# Patient Record
Sex: Male | Born: 1940 | Race: White | Hispanic: No | Marital: Single | Smoking: Never smoker
Health system: Southern US, Community
[De-identification: ages and names within clinical notes are randomized; demographics above are authoritative.]

## PROBLEM LIST (undated history)

## (undated) DIAGNOSIS — E1140 Type 2 diabetes mellitus with diabetic neuropathy, unspecified: Secondary | ICD-10-CM

## (undated) DIAGNOSIS — I480 Paroxysmal atrial fibrillation: Secondary | ICD-10-CM

## (undated) DIAGNOSIS — I4891 Unspecified atrial fibrillation: Secondary | ICD-10-CM

## (undated) DIAGNOSIS — E119 Type 2 diabetes mellitus without complications: Secondary | ICD-10-CM

## (undated) DIAGNOSIS — I429 Cardiomyopathy, unspecified: Secondary | ICD-10-CM

## (undated) DIAGNOSIS — I219 Acute myocardial infarction, unspecified: Secondary | ICD-10-CM

## (undated) DIAGNOSIS — I10 Essential (primary) hypertension: Secondary | ICD-10-CM

## (undated) DIAGNOSIS — N429 Disorder of prostate, unspecified: Secondary | ICD-10-CM

## (undated) DIAGNOSIS — Z950 Presence of cardiac pacemaker: Secondary | ICD-10-CM

## (undated) DIAGNOSIS — F0390 Unspecified dementia without behavioral disturbance: Secondary | ICD-10-CM

## (undated) DIAGNOSIS — K219 Gastro-esophageal reflux disease without esophagitis: Secondary | ICD-10-CM

## (undated) DIAGNOSIS — I639 Cerebral infarction, unspecified: Secondary | ICD-10-CM

## (undated) DIAGNOSIS — Z972 Presence of dental prosthetic device (complete) (partial): Secondary | ICD-10-CM

## (undated) DIAGNOSIS — Z973 Presence of spectacles and contact lenses: Secondary | ICD-10-CM

## (undated) DIAGNOSIS — Z955 Presence of coronary angioplasty implant and graft: Secondary | ICD-10-CM

## (undated) DIAGNOSIS — N400 Enlarged prostate without lower urinary tract symptoms: Secondary | ICD-10-CM

## (undated) DIAGNOSIS — Z95810 Presence of automatic (implantable) cardiac defibrillator: Secondary | ICD-10-CM

## (undated) DIAGNOSIS — N200 Calculus of kidney: Secondary | ICD-10-CM

## (undated) DIAGNOSIS — H269 Unspecified cataract: Secondary | ICD-10-CM

## (undated) HISTORY — PX: HX APPENDECTOMY: SHX54

## (undated) HISTORY — PX: HX TONSILLECTOMY: SHX27

## (undated) HISTORY — PX: CORONARY ARTERY ANGIOPLASTY: PR CATH30428

## (undated) HISTORY — PX: HX LITHOTRIPSY: SHX66

## (undated) HISTORY — PX: HX PACEMAKER DEFIBRILLATOR PLACEMENT: SHX43

## (undated) HISTORY — DX: Calculus of kidney: N20.0

## (undated) HISTORY — DX: Benign prostatic hyperplasia without lower urinary tract symptoms: N40.0

## (undated) HISTORY — PX: HX ADENOIDECTOMY: SHX29

## (undated) HISTORY — DX: Cerebral infarction, unspecified (CMS HCC): I63.9

## (undated) HISTORY — DX: Unspecified dementia, unspecified severity, without behavioral disturbance, psychotic disturbance, mood disturbance, and anxiety (CMS HCC): F03.90

## (undated) HISTORY — PX: HX HEART CATHETERIZATION: SHX148

## (undated) HISTORY — PX: HX CARDIAC ABLATION: 2100001323

## (undated) HISTORY — PX: TONSILLECTOMY: SUR1361

## (undated) HISTORY — PX: APPENDECTOMY: SHX54

## (undated) HISTORY — PX: BIV ICD GENERTAOR CHANGE OUT: SHX5745

---

## 2011-08-28 ENCOUNTER — Inpatient Hospital Stay (HOSPITAL_COMMUNITY): Payer: Medicare Other

## 2011-08-28 ENCOUNTER — Encounter (HOSPITAL_COMMUNITY): Payer: Self-pay | Admitting: Internal Medicine

## 2011-08-28 ENCOUNTER — Inpatient Hospital Stay
Admission: AD | Admit: 2011-08-28 | Discharge: 2011-08-30 | DRG: 246 | Disposition: A | Discharge status: Home / Self Care | Payer: Medicare Other | Source: Other Acute Inpatient Hospital

## 2011-08-28 DIAGNOSIS — I200 Unstable angina: Secondary | ICD-10-CM | POA: Diagnosis present

## 2011-08-28 DIAGNOSIS — I447 Left bundle-branch block, unspecified: Secondary | ICD-10-CM | POA: Diagnosis present

## 2011-08-28 DIAGNOSIS — E876 Hypokalemia: Secondary | ICD-10-CM | POA: Diagnosis present

## 2011-08-28 DIAGNOSIS — I10 Essential (primary) hypertension: Secondary | ICD-10-CM | POA: Diagnosis present

## 2011-08-28 DIAGNOSIS — I2542 Coronary artery dissection: Secondary | ICD-10-CM | POA: Diagnosis not present

## 2011-08-28 DIAGNOSIS — I519 Heart disease, unspecified: Secondary | ICD-10-CM | POA: Diagnosis not present

## 2011-08-28 DIAGNOSIS — Y840 Cardiac catheterization as the cause of abnormal reaction of the patient, or of later complication, without mention of misadventure at the time of the procedure: Secondary | ICD-10-CM | POA: Diagnosis not present

## 2011-08-28 DIAGNOSIS — I2510 Atherosclerotic heart disease of native coronary artery without angina pectoris: Principal | ICD-10-CM | POA: Diagnosis present

## 2011-08-28 DIAGNOSIS — E119 Type 2 diabetes mellitus without complications: Secondary | ICD-10-CM | POA: Diagnosis present

## 2011-08-28 HISTORY — DX: Type 2 diabetes mellitus without complications (CMS HCC): E11.9

## 2011-08-28 HISTORY — DX: Presence of spectacles and contact lenses: Z97.3

## 2011-08-28 HISTORY — DX: Essential (primary) hypertension: I10

## 2011-08-28 HISTORY — DX: Presence of dental prosthetic device (complete) (partial): Z97.2

## 2011-08-28 LAB — CBC/DIFF
BASOPHILS: 1 % (ref 0–1)
BASOS ABS: 0.033 THOU/uL (ref 0.0–0.2)
EOS ABS: 0.142 THOU/uL (ref 0.1–0.3)
EOSINOPHIL: 3 % (ref 1–6)
HCT: 38.2 % — ABNORMAL LOW (ref 39.8–50.2)
HGB: 13.7 g/dL (ref 13.1–17.3)
LYMPHOCYTES: 36 % (ref 20–45)
LYMPHS ABS: 2.02 10*3/uL (ref 1.0–4.8)
MCH: 29.4 pg (ref 27.4–33.0)
MCHC: 35.8 g/dL — ABNORMAL HIGH (ref 31.6–35.5)
MCV: 82.2 fL (ref 82.0–99.0)
MONOCYTES: 11 % (ref 4–13)
MONOS ABS: 0.64 THOU/uL (ref 0.1–0.9)
MPV: 7.9 FL (ref 7.4–10.4)
NRBC'S: 0 /100{WBCs}
PLATELET COUNT: 210 THOU/uL (ref 140–450)
PMN ABS: 2.77 THOU/uL (ref 1.5–7.7)
PMN'S: 49 % (ref 40–75)
RBC: 4.65 MIL/uL (ref 4.46–5.70)
RDW: 12.5 % (ref 10.2–14.0)
WBC: 5.6 10*3/uL (ref 3.5–11.0)

## 2011-08-28 LAB — BASIC METABOLIC PANEL
ANION GAP: 9 mmol/L (ref 5–16)
BUN/CREAT RATIO: 18 (ref 6–22)
BUN: 20 mg/dL (ref 8–26)
CALCIUM: 9.2 mg/dL (ref 8.5–10.4)
CARBON DIOXIDE: 23 mmol/L (ref 23–33)
CHLORIDE: 107 mmol/L (ref 96–111)
CREATININE: 1.14 mg/dL (ref 0.62–1.27)
ESTIMATED GLOMERULAR FILTRATION RATE: >59 mL/min/{1.73_m2} (ref >59)
GLUCOSE,NONFAST: 128 mg/dL (ref 65–139)
POTASSIUM: 3.6 mmol/L (ref 3.5–5.1)
SODIUM: 139 mmol/L (ref 136–145)

## 2011-08-28 LAB — CREATINE KINASE (CK), MB FRACTION, SERUM
CK-MB: 2.6 ng/mL (ref <6.4)
MB INDEX: 5.7 % — ABNORMAL HIGH (ref 0.0–5.0)

## 2011-08-28 LAB — MAGNESIUM: MAGNESIUM: 1.9 mg/dL (ref 1.7–2.5)

## 2011-08-28 LAB — TROPONIN-I: TROPONIN-I: 0.068 ng/mL (ref <0.030)

## 2011-08-28 MED ORDER — SODIUM CHLORIDE 0.9 % (FLUSH) INJECTION SYRINGE
2.0000 mL | INJECTION | INTRAMUSCULAR | Status: DC | PRN
Start: 2011-08-28 — End: 2011-08-30

## 2011-08-28 MED ORDER — NITROGLYCERIN 0.4 MG SUBLINGUAL TABLET
0.4000 mg | SUBLINGUAL_TABLET | SUBLINGUAL | Status: DC | PRN
Start: 2011-08-28 — End: 2011-08-30

## 2011-08-28 MED ORDER — ASPIRIN 81 MG CHEWABLE TABLET
81.0000 mg | CHEWABLE_TABLET | Freq: Every day | ORAL | Status: DC
Start: 2011-08-29 — End: 2011-08-30
  Administered 2011-08-29 – 2011-08-30 (×2): 81 mg via ORAL
  Filled 2011-08-28 (×2): qty 1

## 2011-08-28 MED ORDER — INSULIN LISPRO 100 UNIT/ML SUB-Q SSIP
2.00 [IU] | INJECTION | Freq: Four times a day (QID) | SUBCUTANEOUS | Status: DC | PRN
Start: 2011-08-28 — End: 2011-08-30
  Administered 2011-08-29 (×2): 2 [IU] via SUBCUTANEOUS
  Filled 2011-08-28: qty 3

## 2011-08-28 MED ORDER — METOPROLOL TARTRATE 12.5 MG HALF TAB
12.5000 mg | ORAL_TABLET | Freq: Two times a day (BID) | ORAL | Status: DC
Start: 2011-08-29 — End: 2011-08-30
  Administered 2011-08-29 – 2011-08-30 (×4): 12.5 mg via ORAL
  Filled 2011-08-28 (×5): qty 1

## 2011-08-28 MED ORDER — LISINOPRIL 5 MG TABLET
5.0000 mg | ORAL_TABLET | Freq: Every day | ORAL | Status: DC
Start: 2011-08-29 — End: 2011-08-30
  Administered 2011-08-29 – 2011-08-30 (×2): 5 mg via ORAL
  Filled 2011-08-28 (×2): qty 1

## 2011-08-28 MED ORDER — SODIUM CHLORIDE 0.9 % (FLUSH) INJECTION SYRINGE
2.0000 mL | INJECTION | Freq: Three times a day (TID) | INTRAMUSCULAR | Status: DC
Start: 2011-08-28 — End: 2011-08-30
  Administered 2011-08-28 – 2011-08-30 (×5): 2 mL
  Administered 2011-08-30: 0 mL

## 2011-08-28 MED ORDER — ONDANSETRON HCL (PF) 4 MG/2 ML INJECTION SOLUTION
4.0000 mg | Freq: Four times a day (QID) | INTRAMUSCULAR | Status: DC | PRN
Start: 2011-08-28 — End: 2011-08-30

## 2011-08-28 NOTE — H&P (Addendum)
[facility]   Cardiology Admission  History and Physical    Name:  [name] Bed: 1061/A   Age:  71 y.o. Date of Birth:     Gender:  male MRN: [id number]    Dx at Admission: effort induced hypotension  Date of Admission:  08/28/2011   Date of Service: 08/29/2011  Hospital Day #:   LOS: 1 day    PCP:  Dr. Durel Salts  Code: Full  Cardiologist: no cardiologist: cardiologist moved to [place]    Information Obtained from: patient  Chief Complaint:  Transfer from [facility] for new onset LBBB identified on EKG; Dizziness and decreased exercise capacity x 1 month    HPI: [name] is a 71 y.o. male with a history of Hypertension, Diabetes, and history Kidney stones. who presents as a transfer from [facility] for possible left catheterization due to identification of new LBBB on EKG with elevated troponins (0.076 -> 0.109). He has never had any chest pain. However he did experience dizziness and decreased blood pressures on the day of admission to [facility]. He does have a little back tenderness which he attributes to lying in the hospital bed. He denied any shortness of breath, abdominal pain, nausea, vomiting, diarrhea, and constipation, bloody stools, or urinary symptoms. He is otherwise doing well.    Cardiac Risk Factors:    Past cardiac history: No  Current/Recent Smoker (within 1 year): No  Hypertension:   (BP 140/90): Yes  Dyslipidemia: (Total Chol greater than 200/LDL greater than 130 mg/dL): No  Fam Hx of Premature CAD:(male less than 44yrs; male less than 66 yrs): Yes  Cerebrovascular Disease :  No  Peripheral Arterial Disease: No  Diabetes Mellitus: Yes  Currently On Dialysis:  No  Chronic Lung Disease:  No  Cardiac Arrest w/in 24 Hours: No  Prior history of MI:   No  Prior PCI: No  MI this admission: No  CAD Presentation:  No symptoms or angina in last 14 days  Anginal Classification w/in 2 Weeks:  No symptoms   Prior CABG: No  Prior Valve Surgery/Procedure:  No  Pre-operative Evaluation before Non-Cardiac Surgery: No  Prior Heart Failure: No  Cardiomyopathy: No  LV Systolic Dysfunction:  No  Stress or Imaging Studies Performed:  None    PAST MEDICAL:    Past Medical History   Diagnosis Date   . HTN (hypertension)    . Diabetes mellitus    . Wears glasses    . Wears dentures     Past Surgical History   Procedure Date   . Hx lithotripsy    . Hx appendectomy    . Hx tonsillectomy    . Hx adenoidectomy         Medications Prior to Admission     Medication    metFORMIN (GLUCOPHAGE) 850 mg Oral Tablet    Take 850 mg by mouth Twice daily with food.    lisinopril (PRINIVIL) 5 mg Oral Tablet    Take 5 mg by mouth Once a day.    aspirin 81 mg Oral Tablet, Chewable    Take 81 mg by mouth Once a day.             No Known Allergies    Family History  Family History   Problem Relation Age of Onset   . Coronary Artery Disease Other      Grandmother     Social History  History  Social History   . Marital Status: Single     Spouse Name: N/A     Number of Children: N/A   . Years of Education: N/A     Occupational History   . Not on file.     Social History Main Topics   . Smoking status: Never Smoker    . Smokeless tobacco: Never Used   . Alcohol Use: 0.5 oz/week     1 Cans of beer per week   . Drug Use: No   . Sexually Active: Not on file     Other Topics Concern   . Not on file     Social History Narrative   . No narrative on file     Vaccinations:      Pneumovax: Patient declines vaccination    Influenza: Patient has contra-indication to influenza vaccination.    Review of Systems:  General: Negative for fevers, chills, sweats, fatigue, or weight loss   Eyes:  Negative for change in vision, blurry vision, red eye, or pain   HENT: Negative for hearing loss,  tinnitus, ear drainage,  earaches, nasal congestion, epistaxis, sore mouth,  sore throat    Cardiovascular: Negative for chest pain, dyspnea, palpitations,  irregular heart beats, lower extremity edema   Respiratory: Negative for cough, sputum, hemoptysis, pleurisy/chest pain,  wheezing   GI: Negative for nausea, vomiting, abdominal pain, diarrhea, constipation, melena,  hematochezia, dysphagia, odynophagia   GU: Negative for hematuria, dysuria   Musculoskeletal: Negative for myalgias, arthralgias   Skin: Negative for rashes, lesions   Neurological: (+) dizzy at outside facility - not currently dizzy; Negative for lightheadedness, headaches, paresthesias, weakness     Objective    Diet and I/O's  NPO       Examination:  Temperature: 36.5 C (97.7 F) Heart Rate: 80  BP (Non-Invasive): 149/82 mmHg   Respiratory Rate: 20  SpO2-1: 97 % Pain Score: 0     Physical Exam:  General: Pleasant and cooperative male in no acute distress   Eyes: Conjunctivae/corneas clear, PERRLA, EOM's intact. Fundi benign., Sclera non-icteric.    HENT: Head atraumatic and normocephalic, ENT without erythema or injection, mucous membranes moist.   Neck: Neck supple, no JVD or thyromegaly or lymphadenopathy   Lungs: Clear to auscultation bilaterally.  No crackles, rales or wheezing   Cardiovascular: regular rate and rhythm, S1, S2 normal, no murmur, click, rub or gallop; No chest wall tenderness   Abdomen: Soft, non-tender, Bowel sounds normal, non-distended, No hepatosplenomegaly   Extremities: No cyanosis or edema; pulses 2/2 in UE/LE, Strength 5/5 in UE/LE   Skin: Skin warm and dry, No rashes and No lesions   Neurologic: CN II - XII grossly intact , Alert and oriented x3, reflexes 2/2 in UE/LE     Labs:    CBC:  Recent Labs   Basename 08/29/11 0142 08/28/11 2247    WBC 4.8 5.6    HGB 13.4 13.7    HCT 38.5* 38.2*    PLTCNT 193 210    BANDS -- --     Differential:   Recent Labs   Basename 08/29/11 0142 08/28/11 2247    PMNS 54 49    LYMPHOCYTES 33 36    MONOCYTES 9 11    EOSINOPHIL 3 3    BASOPHILS 1 1    NRBCS 0 0     PMNABS 2.630 2.770    LYMPHSABS 1.580 2.020    MONOSABS 0.456 0.640    EOSABS 0.135 0.142  BASOSABS 0.029 0.033     BMP:  Recent Labs   [facility] 08/29/11 0142 08/28/11 2247    SODIUM 138 139    POTASSIUM 3.3* 3.6    CHLORIDE 106 107    CO2 23 23    BUN 20 20    CREATININE 1.21 1.14    GLUCOSENF 222* 128    ANIONGAP 9 9    GFR >59 >59     Recent Labs   Basename 08/29/11 0142 08/28/11 2247    CALCIUM 9.1 9.2    MAGNESIUM 1.8 1.9    PHOSPHORUS 3.3 --     RBC Indices:  Recent Labs   Basename 08/29/11 0142 08/28/11 2247    MCV 82.5 82.2    MCH 28.7 29.4    MCHC 34.8 35.8*    RDW 12.5 12.5    MPV 8.0 7.9     Cardiac Markers  Recent Labs   Basename 08/29/11 0142 08/28/11 2247    TROPONINI 0.069* 0.068*    CKMB 2.5 2.6    MBINDEX 6.6* 5.7*    BNP -- --     Imaging Studies:    08/28/2011: Chest x-ray: no acute cardiopulmonary processes    EKG:  08/28/2011: LBBB    Assessment/Plan:  Active Hospital Problems   Diagnoses   . Primary Problem: LBBB (left bundle branch block)   . Elevated troponin   . HTN (hypertension)   . Diabetes mellitus       Patient is a 71 y.o. male with a past medical history of HTN and DM who presents from [facility] with new LBBB on EKG with Troponin of 0.109 presenting with dizziness and hypotension (self reported) at the [facility] that resolved. Never had any chest pain prior to and throughout his hospitalization.     LBBB/Elevated Troponin  - Chest x-ray: (-) for any acute cardiopulmonary processes  - EKG: demonstrated LBBB at [facility] and LBBB here  - Trend troponins x 3: 0.109 ([facility]) -> 0.68 -> 0.69  - Aspirin  - Nitrostat  - NPO at midnight for catheterization in the morning    Hypokalemia/Hypomagnesemia  - K-runs x 4  - IV magnesium     HTN  - Blood pressures elevated since admission  - Continue home Lisinopril 5 mg, HCTZ 25 mg oral daily  - Added Metoprolol 12.5 mg oral BID    Diabetes  - Fingersticks  - SSIP conservative      Kriste Basque, MD 08/29/2011 3:25 AM  PGY-I   Department of Internal Medicine  [facility]        I saw and examined the patient.  I reviewed the resident's note.  I agree with the findings and plan of care as documented in the resident's note.  Any exceptions/additions are edited/noted.    Carmie End, MD 08/29/2011, 4:14 PM

## 2011-08-29 ENCOUNTER — Encounter (HOSPITAL_COMMUNITY): Payer: Self-pay | Admitting: Internal Medicine

## 2011-08-29 ENCOUNTER — Inpatient Hospital Stay (HOSPITAL_COMMUNITY): Payer: Medicare Other

## 2011-08-29 LAB — CREATINE KINASE (CK), MB FRACTION, SERUM
CK-MB: 2.5 ng/mL (ref <6.4)
CK-MB: 2.2 ng/mL (ref <6.4)
CK-MB: 1.2 ng/mL (ref <6.4)
CK-MB: 2.1 ng/mL (ref <6.4)
CK-MB: 2.3 ng/mL (ref <6.4)
CK-MB: 2.3 ng/mL (ref <6.4)
MB INDEX: 6.6 % — ABNORMAL HIGH (ref 0.0–5.0)
MB INDEX: 5.9 % — ABNORMAL HIGH (ref 0.0–5.0)
MB INDEX: 5.7 % — ABNORMAL HIGH (ref 0.0–5.0)
MB INDEX: 7 % — ABNORMAL HIGH (ref 0.0–5.0)
MB INDEX: 7.2 % — ABNORMAL HIGH (ref 0.0–5.0)
MB INDEX: 6.4 % — ABNORMAL HIGH (ref 0.0–5.0)

## 2011-08-29 LAB — PERFORM POC WHOLE BLOOD GLUCOSE
GLUCOSE, POINT OF CARE: 185 mg/dL — ABNORMAL HIGH (ref 70–105)
GLUCOSE, POINT OF CARE: 168 mg/dL — ABNORMAL HIGH (ref 70–105)
GLUCOSE, POINT OF CARE: 117 mg/dL — ABNORMAL HIGH (ref 70–105)
GLUCOSE, POINT OF CARE: 167 mg/dL — ABNORMAL HIGH (ref 70–105)

## 2011-08-29 LAB — TROPONIN-I
TROPONIN-I: 0.069 ng/mL (ref <0.030)
TROPONIN-I: 0.066 ng/mL (ref <0.030)
TROPONIN-I: 0.059 ng/mL (ref <0.030)
TROPONIN-I: 0.067 ng/mL (ref <0.030)

## 2011-08-29 LAB — RETIRED - PERFORM POC ACT CELITE POINT OF CARE
ACT CELITE, POC: 180 Seconds — ABNORMAL HIGH (ref <150)
ACT CELITE, POC: 210 Seconds — ABNORMAL HIGH (ref <150)
ACT CELITE, POC: 253 Seconds — ABNORMAL HIGH (ref <150)

## 2011-08-29 LAB — CBC/DIFF
BASOPHILS: 1 % (ref 0–1)
BASOS ABS: 0.029 10*3/uL (ref 0.0–0.2)
EOS ABS: 0.135 THOU/uL (ref 0.1–0.3)
EOSINOPHIL: 3 % (ref 1–6)
HCT: 38.5 % — ABNORMAL LOW (ref 39.8–50.2)
HGB: 13.4 g/dL (ref 13.1–17.3)
LYMPHOCYTES: 33 % (ref 20–45)
LYMPHS ABS: 1.58 THOU/uL (ref 1.0–4.8)
MCH: 28.7 pg (ref 27.4–33.0)
MCHC: 34.8 g/dL (ref 31.6–35.5)
MCV: 82.5 fL (ref 82.0–99.0)
MONOCYTES: 9 % (ref 4–13)
MONOS ABS: 0.456 10*3/uL (ref 0.1–0.9)
MPV: 8 FL (ref 7.4–10.4)
NRBC'S: 0 /100{WBCs}
PLATELET COUNT: 193 10*3/uL (ref 140–450)
PMN ABS: 2.63 THOU/uL (ref 1.5–7.7)
PMN'S: 54 % (ref 40–75)
RBC: 4.67 MIL/uL (ref 4.46–5.70)
RDW: 12.5 % (ref 10.2–14.0)
WBC: 4.8 THOU/uL (ref 3.5–11.0)

## 2011-08-29 LAB — BASIC METABOLIC PANEL
ANION GAP: 9 mmol/L (ref 5–16)
BUN/CREAT RATIO: 17 (ref 6–22)
BUN: 20 mg/dL (ref 8–26)
CALCIUM: 9.1 mg/dL (ref 8.5–10.4)
CARBON DIOXIDE: 23 mmol/L (ref 23–33)
CHLORIDE: 106 mmol/L (ref 96–111)
CREATININE: 1.21 mg/dL (ref 0.62–1.27)
ESTIMATED GLOMERULAR FILTRATION RATE: >59 mL/min/{1.73_m2} (ref >59)
GLUCOSE,NONFAST: 222 mg/dL — ABNORMAL HIGH (ref 65–139)
POTASSIUM: 3.3 mmol/L — ABNORMAL LOW (ref 3.5–5.1)
SODIUM: 138 mmol/L (ref 136–145)

## 2011-08-29 LAB — LIPID PANEL
HDL-CHOLESTEROL: 39 mg/dL — ABNORMAL LOW (ref >39)
LDL (CALCULATED): 133 mg/dL — ABNORMAL HIGH (ref <100)
NON - HDL (CALCULATED): 167 mg/dL (ref <190)
TRIGLYCERIDES: 172 mg/dL — ABNORMAL HIGH (ref <150)
VLDL (CALCULATED): 34 mg/dL — ABNORMAL HIGH (ref <30)

## 2011-08-29 LAB — CREATINE KINASE (CK), TOTAL, SERUM
CREATINE KINASE (CK): 38 U/L — ABNORMAL LOW (ref 48–222)
CREATINE KINASE (CK): 37 U/L — ABNORMAL LOW (ref 48–222)
CREATINE KINASE (CK): 32 U/L — ABNORMAL LOW (ref 48–222)

## 2011-08-29 LAB — HGA1C (HEMOGLOBIN A1C WITH EST AVG GLUCOSE)
ESTIMATED AVERAGE GLUCOSE: 146 mg/dL
HEMOGLOBIN A1C: 6.7 % — ABNORMAL HIGH (ref 4.0–6.0)

## 2011-08-29 LAB — MAGNESIUM: MAGNESIUM: 1.8 mg/dL (ref 1.7–2.5)

## 2011-08-29 LAB — CREATINE KINASE (CK), TOTAL, SERUM OR PLASMA
CREATINE KINASE (CK): 21 U/L — ABNORMAL LOW (ref 48–222)
CREATINE KINASE (CK): 30 U/L — ABNORMAL LOW (ref 48–222)
CREATINE KINASE (CK): 36 U/L — ABNORMAL LOW (ref 48–222)

## 2011-08-29 LAB — PHOSPHORUS: PHOSPHORUS: 3.3 mg/dL (ref 2.4–4.7)

## 2011-08-29 MED ORDER — HEPARIN (PORCINE) 1,000 UNIT/ML INJECTION SOLUTION
50.00 [IU]/kg | INTRAMUSCULAR | Status: AC
Start: 2011-08-29 — End: 2011-08-29
  Administered 2011-08-29: 2000 [IU] via INTRAVENOUS
  Administered 2011-08-29: 4350 [IU] via INTRAVENOUS
  Administered 2011-08-29: 3000 [IU] via INTRAVENOUS

## 2011-08-29 MED ORDER — CLOPIDOGREL 75 MG TABLET
75.0000 mg | ORAL_TABLET | Freq: Every day | ORAL | Status: DC
Start: 2011-08-30 — End: 2011-08-30
  Administered 2011-08-30: 75 mg via ORAL
  Filled 2011-08-29: qty 1

## 2011-08-29 MED ORDER — FENTANYL (PF) 50 MCG/ML INJECTION SOLUTION
25.00 ug | INTRAMUSCULAR | Status: AC | PRN
Start: 2011-08-29 — End: 2011-08-29
  Administered 2011-08-29 (×4): 25 ug via INTRAVENOUS

## 2011-08-29 MED ORDER — MIDAZOLAM 1 MG/ML INJECTION SOLUTION
INTRAMUSCULAR | Status: AC
Start: 2011-08-29 — End: 2011-08-29
  Filled 2011-08-29: qty 2

## 2011-08-29 MED ORDER — HEPARIN (PORCINE) 1,000 UNIT/ML INJECTION SOLUTION
INTRAMUSCULAR | Status: AC
Start: 2011-08-29 — End: 2011-08-29
  Filled 2011-08-29: qty 10

## 2011-08-29 MED ORDER — EPTIFIBATIDE 0.75 MG/ML INTRAVENOUS SOLUTION
INTRAVENOUS | Status: AC
Start: 2011-08-29 — End: 2011-08-29
  Filled 2011-08-29: qty 100

## 2011-08-29 MED ORDER — EPTIFIBATIDE 0.75 MG/ML INTRAVENOUS SOLUTION
2.00 ug/kg/min | INTRAVENOUS | Status: DC
Start: 2011-08-29 — End: 2011-08-29
  Administered 2011-08-29: 2 ug/kg/min via INTRAVENOUS

## 2011-08-29 MED ORDER — IOVERSOL 320 MG IODINE/ML INTRAVENOUS SOLUTION
100.00 mL | INTRAVENOUS | Status: DC
Start: 2011-08-29 — End: 2011-08-30

## 2011-08-29 MED ORDER — MIDAZOLAM 1 MG/ML INJECTION SOLUTION
INTRAMUSCULAR | Status: AC
Start: 2011-08-29 — End: 2011-08-29
  Filled 2011-08-29: qty 4

## 2011-08-29 MED ORDER — MIDAZOLAM 1 MG/ML INJECTION SOLUTION
2.00 mg | INTRAMUSCULAR | Status: AC | PRN
Start: 2011-08-29 — End: 2011-08-29
  Administered 2011-08-29: 1 mg via INTRAVENOUS
  Administered 2011-08-29: 2 mg via INTRAVENOUS
  Administered 2011-08-29 (×3): 1 mg via INTRAVENOUS

## 2011-08-29 MED ORDER — VERAPAMIL 2.5 MG/ML INTRAVENOUS SOLUTION
INTRAVENOUS | Status: AC
Start: 2011-08-29 — End: 2011-08-29
  Filled 2011-08-29: qty 2

## 2011-08-29 MED ORDER — LIDOCAINE HCL 20 MG/ML (2 %) INJECTION SOLUTION
INTRAMUSCULAR | Status: AC
Start: 2011-08-29 — End: 2011-08-29

## 2011-08-29 MED ORDER — HEPARIN (PORCINE) 5,000 UNIT/ML INJECTION SOLUTION
5000.00 [IU] | Freq: Three times a day (TID) | INTRAMUSCULAR | Status: DC
Start: 2011-08-29 — End: 2011-08-30
  Administered 2011-08-29 – 2011-08-30 (×4): 5000 [IU] via SUBCUTANEOUS
  Administered 2011-08-30: 0 [IU] via SUBCUTANEOUS
  Filled 2011-08-29 (×7): qty 1

## 2011-08-29 MED ORDER — POTASSIUM CHLORIDE 20 MEQ/15 ML ORAL LIQUID
60.0000 meq | ORAL | Status: AC
Start: 2011-08-29 — End: 2011-08-29
  Administered 2011-08-29: 60 meq via ORAL
  Filled 2011-08-29: qty 45

## 2011-08-29 MED ORDER — EPTIFIBATIDE 2 MG/ML INTRAVENOUS SOLUTION
INTRAVENOUS | Status: AC
Start: 2011-08-29 — End: 2011-08-29
  Filled 2011-08-29: qty 1

## 2011-08-29 MED ORDER — CLOPIDOGREL 300 MG TABLET
300.0000 mg | ORAL_TABLET | Freq: Once | ORAL | Status: AC
Start: 2011-08-29 — End: 2011-08-29
  Administered 2011-08-29: 300 mg via ORAL
  Filled 2011-08-29: qty 1

## 2011-08-29 MED ORDER — FENTANYL (PF) 50 MCG/ML INJECTION SOLUTION
INTRAMUSCULAR | Status: AC
Start: 2011-08-29 — End: 2011-08-29
  Filled 2011-08-29: qty 2

## 2011-08-29 MED ORDER — EPTIFIBATIDE 2 MG/ML INTRAVENOUS SOLUTION
180.00 ug/kg | INTRAVENOUS | Status: AC
Start: 2011-08-29 — End: 2011-08-29
  Administered 2011-08-29: 15.66 mg via INTRAVENOUS

## 2011-08-29 MED ORDER — MAGNESIUM SULFATE 1 GRAM/100 ML IN DEXTROSE 5 % INTRAVENOUS PIGGYBACK
1.0000 g | INJECTION | Freq: Once | INTRAVENOUS | Status: AC
Start: 2011-08-29 — End: 2011-08-29
  Administered 2011-08-29: 1 g via INTRAVENOUS
  Filled 2011-08-29: qty 100

## 2011-08-29 MED ORDER — POTASSIUM CHLORIDE 10 MEQ/50 ML IN STERILE WATER INTRAVENOUS PIGGYBACK
10.0000 meq | INJECTION | INTRAVENOUS | Status: DC
Start: 2011-08-29 — End: 2011-08-29
  Administered 2011-08-29: 0 meq via INTRAVENOUS
  Filled 2011-08-29 (×4): qty 50

## 2011-08-29 MED ORDER — PERFLUTREN LIPID MICROSPHERES 1.1 MG/ML INTRAVENOUS SUSPENSION
0.30 mL | INTRAVENOUS | Status: AC
Start: 2011-08-29 — End: 2011-08-29
  Administered 2011-08-29: 0.3 mL via INTRAVENOUS

## 2011-08-29 MED ORDER — IOPAMIDOL 300 MG IODINE/ML (61 %) INTRAVENOUS SOLUTION
100.00 mL | INTRAVENOUS | Status: AC
Start: 2011-08-29 — End: 2011-08-29
  Administered 2011-08-29: 305 mL via INTRA_ARTERIAL

## 2011-08-29 MED ORDER — POTASSIUM CHLORIDE ER 20 MEQ TABLET,EXTENDED RELEASE(PART/CRYST)
40.0000 meq | ORAL_TABLET | ORAL | Status: AC
Start: 2011-08-29 — End: 2011-08-29
  Administered 2011-08-29: 40 meq via ORAL
  Filled 2011-08-29: qty 2

## 2011-08-29 MED ORDER — HYDROCHLOROTHIAZIDE 25 MG TABLET
25.0000 mg | ORAL_TABLET | Freq: Every day | ORAL | Status: DC
Start: 2011-08-29 — End: 2011-08-30
  Administered 2011-08-29 – 2011-08-30 (×2): 25 mg via ORAL
  Filled 2011-08-29 (×2): qty 1

## 2011-08-29 MED ORDER — SODIUM CHLORIDE 0.9 % INTRAVENOUS SOLUTION
INTRAVENOUS | Status: DC
Start: 2011-08-29 — End: 2011-08-30

## 2011-08-29 MED ORDER — VERAPAMIL 2.5 MG/ML INTRAVENOUS SOLUTION
5.00 mg | INTRAVENOUS | Status: AC
Start: 2011-08-29 — End: 2011-08-29
  Administered 2011-08-29: 5 mg via INTRA_ARTERIAL

## 2011-08-29 NOTE — Care Plan (Signed)
Problem: General Plan of Care(Adult,OB)  Goal: Plan of Care Review(Adult,OB)  The patient and/or their representative will communicate an understanding of their plan of care   Patient will be discharged to home when medically stable.    The patient will continue to be evaluated for developing discharge needs.

## 2011-08-29 NOTE — Care Plan (Signed)
 Problem: General Plan of Care(Adult,OB)  Goal: Plan of Care Review(Adult,OB)  The patient and/or their representative will communicate an understanding of their plan of care   Outcome: Ongoing (see interventions/notes)  Admission Note: 71 yo male admitted from [facility] where he presented with hypotension, elevated HR, elevated glucose, and upward trending Troponins. Assessment per flowsheet. Admission completed. Pt NPO. Last BM 08/28/11. Pt may go to cath lab in the AM.

## 2011-08-29 NOTE — Progress Notes (Addendum)
[facility]   Cardiology   Progress Note      Truett Mainland  Date of Admission:  08/28/2011  Date of service: 08/29/2011  Date of Birth:    MRN:  [id number]    Hospital Day:  LOS: 1 day   Subjective: 71 y.o. male with a history of Hypertension, Diabetes, and history Kidney stones. who presents as a transfer from [facility] for possible left catheterization due to identification of new LBBB on EKG with elevated troponins (0.076 to 0.109). Patient today still with some chest pain but denies SOB. No acute issues overnight. No other complaints.    Objective:     Vital Signs:  Temp (24hrs) Max:37.2 C (99 F)      Temperature: 36.8 C (98.2 F) (08/29/11 0335)  BP (Non-Invasive): 144/66 mmHg (08/29/11 0335)  MAP (Non-Invasive): 111 mmHG (08/28/11 2245)  Heart Rate: 82  (08/29/11 0335)  Respiratory Rate: 19  (08/29/11 0335)  Pain Score: 0 (08/29/11 0330)  SpO2-1: 99 % (08/29/11 0335)  Physical Exam:  General: no acute distress and appears stated age  Lungs: clear to auscultation bilaterally, no wheezes  Cardiovascular: regular rate and rhythm, no murmurs  Abdomen: soft, non-tender, bowel sounds normal and non-distended  Extremities: No edema or cyanosis  Neurologic: grossly normal, CN II - XII grosly intact  and alert and oriented x3    I/O:  I/O last 24 hours:    Intake/Output Summary (Last 24 hours) at 08/29/11 0731  Last data filed at 08/29/11 7829   Gross per 24 hour   Intake    120 ml   Output    110 ml   Net     10 ml     I/O current shift:  02/15 0000 - 02/15 0759  In: 110 [I.V.:110]  Out: 110 [Urine:110]  Blood Sugars:   Point of Care Glucose Last 24 Hr Results:    Recent Labs   Basename 08/29/11 0626 08/28/11 2246    GLUCOSEPOC 185* 121*       Labs:  I have reviewed all lab results.  Lab Results for Last 24 Hours:    Results for orders placed during the hospital encounter of 08/28/11 (from the past 24 hour(s))   POCT WHOLE BLOOD GLUCOSE       Component Value Range     GLUCOSE, POINT OF CARE 121 (*) 70 - 105 mg/dL   CBC/DIFF       Component Value Range    WBC 5.6  3.5 - 11.0 THOU/uL    RBC 4.65  4.46 - 5.70 MIL/uL    HGB 13.7  13.1 - 17.3 g/dL    HCT 56.2 (*) 13.0 - 50.2 %    MCV 82.2  82.0 - 99.0 fL    MCH 29.4  27.4 - 33.0 pg    MCHC 35.8 (*) 31.6 - 35.5 g/dL    RDW 86.5  78.4 - 69.6 %    PLATELET COUNT 210  140 - 450 THOU/uL    MPV 7.9  7.4 - 10.4 FL    PMN'S 49  40 - 75 %    PMN ABS 2.770  1.5 - 7.7 THOU/uL    LYMPHOCYTES 36  20 - 45 %    LYMPHS ABS 2.020  1.0 - 4.8 THOU/uL    MONOCYTES 11  4 - 13 %    MONOS ABS 0.640  0.1 - 0.9 THOU/uL    EOSINOPHIL 3  1 - 6 %  EOS ABS 0.142  0.1 - 0.3 THOU/uL    BASOPHILS 1  0 - 1 %    BASOS ABS 0.033  0.0 - 0.2 THOU/uL    NRBC'S 0  0 /100WBC   BASIC METABOLIC PANEL, NON-FASTING       Component Value Range    SODIUM 139  136 - 145 mmol/L    POTASSIUM 3.6  3.5 - 5.1 mmol/L    CHLORIDE 107  96 - 111 mmol/L    CARBON DIOXIDE 23  23 - 33 mmol/L    ANION GAP 9  5 - 16 mmol/L    CREATININE 1.14  0.62 - 1.27 mg/dL    ESTIMATED GLOMERULAR FILTRATION RATE >59  >59 ml/min/1.11m2    GLUCOSE,NONFAST 128  65 - 139 mg/dL    BUN 20  8 - 26 mg/dL    BUN/CREAT RATIO 18  6 - 22    CALCIUM 9.2  8.5 - 10.4 mg/dL   MAGNESIUM       Component Value Range    MAGNESIUM 1.9  1.7 - 2.5 mg/dL   TROPONIN-I       Component Value Range    TROPONIN-I 0.068 (*) <0.030 ng/mL   CREATINE KINASE (CK), TOTAL       Component Value Range    CREATINE KINASE (CK) 46 (*) 48 - 222 U/L   CREATINE KINASE (CK) MB ISOENZYME       Component Value Range    CK-MB 2.6  <6.4 ng/mL    MB INDEX 5.7 (*) 0.0 - 5.0 %   LIPID PANEL       Component Value Range    TRIGLYCERIDES 172 (*) <150 mg/dL    CHOLESTEROL 272 (*) <200 mg/dL    HDL-CHOLESTEROL 39 (*) >39 mg/dL    LDL (CALCULATED) 536 (*) <100 mg/dL    VLDL (CALCULATED) 34 (*) <30 mg/dL    NON - HDL (CALCULATED) 167  <190 mg/dL   CBC/DIFF       Component Value Range    WBC 4.8  3.5 - 11.0 THOU/uL    RBC 4.67  4.46 - 5.70 MIL/uL     HGB 13.4  13.1 - 17.3 g/dL    HCT 64.4 (*) 03.4 - 50.2 %    MCV 82.5  82.0 - 99.0 fL    MCH 28.7  27.4 - 33.0 pg    MCHC 34.8  31.6 - 35.5 g/dL    RDW 74.2  59.5 - 63.8 %    PLATELET COUNT 193  140 - 450 THOU/uL    MPV 8.0  7.4 - 10.4 FL    PMN'S 54  40 - 75 %    PMN ABS 2.630  1.5 - 7.7 THOU/uL    LYMPHOCYTES 33  20 - 45 %    LYMPHS ABS 1.580  1.0 - 4.8 THOU/uL    MONOCYTES 9  4 - 13 %    MONOS ABS 0.456  0.1 - 0.9 THOU/uL    EOSINOPHIL 3  1 - 6 %    EOS ABS 0.135  0.1 - 0.3 THOU/uL    BASOPHILS 1  0 - 1 %    BASOS ABS 0.029  0.0 - 0.2 THOU/uL    NRBC'S 0  0 /100WBC   BASIC METABOLIC PANEL, NON-FASTING       Component Value Range    SODIUM 138  136 - 145 mmol/L    POTASSIUM 3.3 (*) 3.5 - 5.1 mmol/L  CHLORIDE 106  96 - 111 mmol/L    CARBON DIOXIDE 23  23 - 33 mmol/L    ANION GAP 9  5 - 16 mmol/L    CREATININE 1.21  0.62 - 1.27 mg/dL    ESTIMATED GLOMERULAR FILTRATION RATE >59  >59 ml/min/1.64m2    GLUCOSE,NONFAST 222 (*) 65 - 139 mg/dL    BUN 20  8 - 26 mg/dL    BUN/CREAT RATIO 17  6 - 22    CALCIUM 9.1  8.5 - 10.4 mg/dL   MAGNESIUM       Component Value Range    MAGNESIUM 1.8  1.7 - 2.5 mg/dL   PHOSPHORUS       Component Value Range    PHOSPHORUS 3.3  2.4 - 4.7 mg/dL   TROPONIN-I       Component Value Range    TROPONIN-I 0.069 (*) <0.030 ng/mL   CREATINE KINASE (CK), TOTAL       Component Value Range    CREATINE KINASE (CK) 38 (*) 48 - 222 U/L   CREATINE KINASE (CK) MB ISOENZYME       Component Value Range    CK-MB 2.5  <6.4 ng/mL    MB INDEX 6.6 (*) 0.0 - 5.0 %   POCT WHOLE BLOOD GLUCOSE       Component Value Range    GLUCOSE, POINT OF CARE 185 (*) 70 - 105 mg/dL       Chest x-ray (02/21/9146): no acute cardiopulmonary processes     EKG (08/28/2011): LBBB    Cath (08/29/2011):  FINDINGS:   HEMODYNAMICS: Central aortic pressure 159/83 with a mean of 112.   LEFT MAIN CORONARY ARTERY: Angiographically normal.    LEFT CIRCUMFLEX CORONARY ARTERY: 20% proximal stenosis, mid-to-distal vessel 20% focal stenosis. The first obtuse marginal branch is subtotally occluded with a 99% stenosis and TIMI-1 flow. OM2 branch had 30% proximal stenosis. It divided into 2 subbranches with luminal irregularities only.   LEFT ANTERIOR DESCENDING CORONARY ARTERY: There is 50-60% stenosis after the takeoff of 2 major diagonal branches that came off proximally in the proximal mid. There was a focal 70-80% mid stenosis. There was distal 70-80% stenosis. The first diagonal branch had an 80% ostial stenosis. The second diagonal branch had luminal irregularities only. The third diagonal branch was small in caliber.   RIGHT CORONARY ARTERY: Was a moderate caliber vessel. It was dominant. There was a mid-to-distal 80-90% stenosis. The PDA had luminal irregularities only. PLB was a small vessel.   INTRAVASCULAR ULTRASOUND TO LEFT ANTERIOR DESCENDING CORONARY ARTERY: Mid vessel had a 3.4 mm2 inner luminal area with a reference vessel 3-3.5 mm in diameter.   PERCUTANEOUS CORONARY INTERVENTION TO THE LEFT ANTERIOR DESCENDING CORONARY ARTERY: Please see dictation.   PERCUTANEOUS CORONARY INTERVENTION TO THE RIGHT CORONARY ARTERY: Please see dictation.   IMPRESSION: Successful 2-vessel percutaneous coronary intervention with drug-eluting stents x3.   RECOMMENDATIONS:   1. Aspirin, Plavix and Integrilin.   2. Risk factor modification and medical therapy for coronary artery disease.   3. Follow up with Dr. Dellia Beckwith.   4. Continue monitoring in stepdown unit for 24 hours or until tomorrow.    Current Medications:    Current Facility-Administered Medications:  hydrochlorothiazide (HYDRODIURIL) tablet 25 mg Oral Daily   heparin 5,000 unit/mL injection 5,000 Units 5,000 Units Subcutaneous Q8HRS   potassium chloride (K-DUR) extended release tablet 40 mEq Oral Now   ondansetron (ZOFRAN) 2 mg/mL injection 4 mg Intravenous Q6H PRN  NS flush syringe 2 mL Intracatheter Q8HRS   And      NS flush syringe 2-6 mL Intracatheter Q1 MIN PRN   aspirin chewable tablet 81 mg 81 mg Oral Daily   lisinopril (PRINIVIL) tablet 5 mg Oral Daily   SSIP insulin lispro (HUMALOG) 100 units/mL injection 2-6 Units Subcutaneous 4x/day PRN   nitroglycerin (NITROSTAT) sublingual tablet 0.4 mg Sublingual Q5 Min PRN   metoprolol tartrate (LOPRESSOR) tablet 12.5 mg Oral Q12H       No Known Allergies      Assessment/ Plan:  Active Hospital Problems   Diagnoses   . Primary Problem: LBBB (left bundle branch block)   . Elevated troponin   . HTN (hypertension)   . Diabetes mellitus       71 y.o. male with a past medical history of HTN and DM who presents from [facility] with new LBBB on EKG with Troponin of 0.109 presenting with dizziness and hypotension (self reported) at the [facility] that resolved. Never had any chest pain prior to and throughout his hospitalization.     1. New LBBB with Elevated Troponin:  - Chest x-ray: (-) for any acute cardiopulmonary processes.  - Patient received loading dose of Plavix from [facility].  - EKG demonstrated LBBB at [facility]. Repeat EKG here also LBBB.  - Serial troponins: 0.109 (from [facility]) then trending (0.68 - 0.69 - 0.066).  - Continue Aspirin 81 mg daily, Plavix 75 mg daily, Nitrostat PRN, and Metoprolol 12.5 mg BID.  - Patient underwent cath today s/p DES x2 to LAD and DES x1 to RCA. Patient will follow up with Dr. Dellia Beckwith in 4 weeks from hospital discharge.    2. Hypokalemia/Hypomagnesemia:  - Given 1 dose of K and Mg.    3. HTN:  - Blood pressures elevated since admission.  - Continue home Lisinopril 5 mg, HCTZ 25 mg oral daily.  - Added Metoprolol 12.5 mg BID.    4. Diabetes:  - Fingersticks TID AC HS.  - SSI while inpatient.      ASA:  yes  Beta Blocker:  yes  ACEI/ARB:  yes  Statin:  no  Plavix: no  Spironolactone Indicated (Heart Failure): no  DVT/PE Prophylaxis: Heparin     Disposition Planning: Home discharge      Rex Toy Care, MD 08/29/2011, 7:31 AM        I saw and examined the patient.  I reviewed the resident's note.  I agree with the findings and plan of care as documented in the resident's note.  Any exceptions/additions are edited/noted.    Carmie End, MD 08/29/2011, 4:14 PM

## 2011-08-29 NOTE — Nurses Notes (Signed)
 Results for REMIGIO, MCMILLON (MRN [id number]) as of 08/29/2011 03:17   Ref. Range 08/29/2011 01:42   POTASSIUM Latest Range: 3.5-5.1 mmol/L 3.3 (L)     Results for ABAS, LEICHT (MRN [id number]) as of 08/29/2011 03:17   Ref. Range 08/29/2011 01:42   MAGNESIUM  Latest Range: 1.7-2.5 mg/dL 1.8     Notified Dr. Josepha.

## 2011-08-29 NOTE — Care Plan (Signed)
Problem: General Plan of Care(Adult,OB)  Goal: Plan of Care Review(Adult,OB)  The patient and/or their representative will communicate an understanding of their plan of care   Outcome: Ongoing (see interventions/notes)  Pt s/p cath. Pt received 2 stents to LAD 1 stent to RCA. Pt has TR band on right radial. Pt being monitored per labs, vitals and telemetry. RN encouraged pt to call for assistance, pt verbalized understanding.

## 2011-08-29 NOTE — Nurses Notes (Signed)
Cardiac Catheterization Lab B        Heart Cath - Dr.Berzingi + Dr.Jain     [name] arrived via bed. Awake,reports no angina/pain/or other problems currently. NSS started via PIV for procedure. Dr.Jain speaking w him,examined,consents obtained.  Procedure reviewed,position,questions solicited,sedation per order.      6045 Preparation for IVUS measurement - LAD.  Dr.Ward joined.   4098 ACT sample obtained,ACT-180.   0945 ACT-210.   1008 ACT-253.     PTCA-LAD (ostial/prox+mid)   Emerge balloon+Xpedition stents x2     PTCA-RCA(mid)   Emerge balloon,Xpedition stent

## 2011-08-29 NOTE — Ancillary Notes (Signed)
[facility]  CVIS Non Invasive Tech Note      Transthoracic Echocardiogram performed.  Final report to follow.    Definity administered by [employer], CARDIOVASCUL 08/29/2011, 2:49 PM

## 2011-08-29 NOTE — Nurses Notes (Signed)
Results for ALWYN, CORDNER (MRN [id number]) as of 08/29/2011 06:47   Ref. Range 08/29/2011 06:26   GLUCOSE, POINT OF CARE Latest Range: 70-105 mg/dL 409 (H)     Notified Dr. Renaldo Reel. Ordered pt not to be covered with insulin at this time as pt is NPO.

## 2011-08-29 NOTE — Nurses Notes (Signed)
 9191- transport RN and SA at bedside to take pt to cath lab. Pt denies pain or sob at this time. Pt transported to cath lab in bed and left in stable condition.

## 2011-08-29 NOTE — Care Management Notes (Signed)
[facility]  Care Management Initial Evaluation    Patient Name: [name]  Date of Birth:   Sex: male  Date/Time of Admission: 08/28/2011 10:06 PM  Room/Bed: 1061/A  Payor: MEDICARE  Plan: MEDICARE PART A AND B  Product Type: Medicare      WINNIE BARSKY is a 71 y.o., male, admitted from [facility] for possible left catheterization due to identification of new LBBB on EKG with elevated troponins (0.076 -> 0.109).  Patient with history of Hypertension, Diabetes, and history Kidney stones.    Height/Weight: 182.9 cm (6') / 87 kg (191 lb 12.8 oz)    Subjective/Objective:      08/29/11 1353   Assessment Detail   Assessment Type Admission   Date of Care Management Update 08/29/11   Care Management Plan   Discharge Planning Status initial meeting   Projected Discharge Date 09/01/11   CM will evaluate for rehabilitation potential yes   Plan home discharge   CM to Do continue to follow progress   Initial Interview In lieu of initial interview, CM will monitor for revisions in the clinical/discharge plan of care.   Discharge Needs Assessment   Discharge Facility/Level Of Care Needs Home (Patient/Family Member/other) (code 1)         LOS: 1 day   Admitting Diagnosis: effort induced hypotension  Assessment:     Plan:  Home discharge  Cardiac cath today  NPO for procedure  IVF's at 50 ml/hr  Troponin 0.069    Discharge Plan:  Home(Patient/Family Member/other) (code 1)    Patient will be discharged to home when medically stable    The patient will continue to be evaluated for developing discharge needs.     Case Manager: Florina Ou, RN 08/29/2011, 1:55 PM  Phone: [phone]

## 2011-08-29 NOTE — Procedures (Signed)
[facility]                                    SECTION OF CARDIOLOGY                               CARDIAC DIAGNOSTIC LABORATORIES                                     [phone]    Department of Medicine      School of Medicine  Section of Cardiology      Medical [facility]                            CARDIAC CATHETERIZATION PROCEDURE REPORT    PATIENT NAME:  [name], [name]   HOSPITAL NUMBER:[id number]  DATE OF BIRTH:   PROCEDURE DATE: 08/29/2011    PRIMARY CARE PHYSICIAN:  Dr. Durel Salts.    PRIMARY CARDIOLOGIST:  Linward Natal, MD    NAME OF PROCEDURES:  1.  Selective coronary angiography.  2.  Intravascular ultrasound to the left anterior descending coronary artery.  3.  Percutaneous coronary intervention to the left anterior descending coronary artery with a 3.0 x 18-mm Xpedition drug-eluting stent, overlapped distally with a 2.75 x 9 mm Xpedition drug-eluting stent due to distal edge dissection.  4.  Percutaneous coronary intervention to the right coronary artery with a 2.5 x 15 mm Xpedition drug-eluting stent.    OPERATORS:  Vincent Gros, MD, Elam Dutch, MD and Delane Ginger, MD.    COMPLICATIONS:  None.    ESTIMATED BLOOD LOSS:  Minimal.    INDICATIONS FOR PROCEDURES:  The patient is a 71 year old male with no prior history of coronary artery disease.  He presented to [facility] due to finding of new left bundle branch block on EKG with elevated troponin to 0.109.  It was discovered due to the patient's symptoms of lightheadedness and hypotension.  These were felt to be possibly very atypical symptoms of acute coronary syndrome.  The patient was referred for cardiac catheterization to further evaluate these findings.  The patient did describe some back pain that may have been part of his anginal constellation as well.     DESCRIPTION OF PROCEDURE:  After informed consent was obtained, the patient was brought to the cath lab where he was prepped and draped in usual fashion.  Versed and fentanyl were given for sedation.  Approximately 1 mL of 2% lidocaine was infiltrated in the right radial area.  Using a true Seldinger technique, a 5-French sheath was placed in the right radial artery.  We took standard catheters to perform selective coronary angiography.  At this point, we decided to further evaluate the LAD.  We took a Pharmacist, hospital guide to the left main coronary ostium.  Selectively engaged.  We used heparin and Integrilin for anticoagulation.  We took a Runthrough wire to the distal LAD.  We passed a 2.9-French [place] IVUS catheter to the mid-to-distal vessel.  IVUS revealed significant stenosis within the mid LAD but nothing at the bifurcation of the diagonals that was significant.  We therefore decided to stent the mid LAD and then proceed with intervention to the right coronary artery as well.  This one was clearly significant angiographically.  We took a 2.5 x 12-mm compliant balloon to the area of stenosis.  We inflated it to 6 atmospheres for 20 seconds.  We then took a 3.0 x 18-mm Xpedition drug-eluting stent to the area of stenosis.  We deployed it at 10 atmospheres for 20 seconds x2 inflations.  Subsequent angiogram showed a distal edge dissection.  We then took a 2.75 x 8-mm Xience Xpedition stent and deployed it at 10 atmospheres for 20 seconds overlapping distally.  We then hit the overlap site at 12 atmospheres for 20 seconds.  Subsequent angiogram showed a good result.  We removed the wire and angiographic findings were unchanged.    Pre-intervention, there was TIMI-3 flow with 70-80% angiographic stenosis and 10-mm lesion length.  Post-intervention, there was TIMI-3 flow with no evidence for dissection and 0% residual stenosis.     We then removed the guide catheter over a wire and took a JR4 guide catheter.  Engaged the right coronary artery.    We took the Runthrough wire to the distal right coronary artery.  We took the 2.5 x 12-mm compliant balloon to the area of stenosis and inflated it to 6 atmospheres for 20 seconds.  We then took a  2.5 x 15-mm Xience Xpedition drug-eluting stent to the area of stenosis and deployed at 12 atmospheres for 20 seconds and reinflated it for the same.  Subsequent angiogram showed an excellent angiographic result.  We removed the wire and angiographic findings were unchanged.    Pre-intervention, there was TIMI-3 flow with 70-80% stenosis and 10-mm lesion length.  Post-intervention, there was TIMI-3 flow with no evidence for dissection and 0% residual stenosis.    We then removed the guide catheter over a wire.  The sheath was removed and hemostasis achieved by application of a Terumo band.  Total contrast used was 300 mL of Isovue.  Total fluoroscopy time was 40.5 minutes.    FINDINGS:    HEMODYNAMICS:  Central aortic pressure 159/83 with a mean of 112.    LEFT MAIN CORONARY ARTERY:  Angiographically normal.    LEFT CIRCUMFLEX CORONARY ARTERY:  20% proximal stenosis, mid-to-distal vessel 20% focal stenosis.  The first obtuse marginal branch is subtotally occluded with a 99% stenosis and TIMI-1 flow.  OM2 branch had 30% proximal stenosis.  It divided into 2 subbranches with luminal irregularities only.    LEFT ANTERIOR DESCENDING CORONARY ARTERY:  There is 50-60% stenosis after the takeoff of 2 major diagonal branches that came off proximally in the proximal mid.  There was a focal 70-80% mid stenosis.  There was distal 70-80% stenosis.  The first diagonal branch had an 80% ostial stenosis.  The second diagonal branch had luminal irregularities only.  The third diagonal branch was small in caliber.     RIGHT CORONARY ARTERY:  Was a moderate caliber vessel.  It was dominant.  There was a mid-to-distal 80-90% stenosis.  The PDA had luminal irregularities only.  PLB was a small vessel.    INTRAVASCULAR ULTRASOUND TO LEFT ANTERIOR DESCENDING CORONARY ARTERY:  Mid vessel had a 3.4 mm2 inner luminal area with a reference vessel 3-3.5 mm in diameter.    PERCUTANEOUS CORONARY INTERVENTION TO THE LEFT ANTERIOR DESCENDING CORONARY ARTERY:  Please see dictation.    PERCUTANEOUS CORONARY INTERVENTION TO THE RIGHT CORONARY ARTERY:  Please see dictation.    IMPRESSION:  Successful 2-vessel percutaneous coronary intervention with drug-eluting stents x3.    RECOMMENDATIONS:  1.  Aspirin, Plavix and Integrilin.  2.  Risk factor modification and medical therapy for coronary artery disease.  3.  Follow up with Dr. Dellia Beckwith.  4.  Continue monitoring in stepdown unit for 24 hours or until tomorrow.      Elam Dutch, MD  Fellow, Section of Cardiology  [facility] Medicine    Linward Natal, MD  Assistant Professor  [employer] Department of Medicine, Section of Cardiology    [id number]; D: 08/29/2011 11:13:01; T: 08/29/2011 13:13:43    See fellow's note for details. I saw and evaluated the patient.  I agree with the fellow's description of the procedure, findings, impressions, and recommendations. I was present for the surgical procedure and otherwise immediately available for the duration of the procedure.  I have reviewed the images and confirmed or revised the interpretation as documented by the fellow.  Any exceptions are noted. I was present and supervised all procedures performed and participated in all critical aspects of these procedures.      Vincent Gros, MD 08/31/2011 4:15 PM

## 2011-08-29 NOTE — Nurses Notes (Signed)
1317- 4cc was removed from TR band. No hematoma, no bleeding noted. Vital signs and CMS checks per flowsheet. RN encouraged pt to call for assistance, pt verbalized understanding.   1331- 4cc was removed from TR band. No hematoma, no bleeding noted. Vital signs and CMS checks per flowsheet. RN encouraged pt to call for assistance, pt verbalized understanding.     1350- Rn into room to remove remaining 3 cc from TR band. Pt stated it was hurting him so he took his TR band off. Site is clean, dry and intact. No bleeding or hematoma noted. RN placed transparent dressing over site. Rn encouraged pt to call for assistance, pt verbalized understanding.

## 2011-08-30 LAB — CBC/DIFF
BASOPHILS: 0 % (ref 0–1)
BASOS ABS: 0.023 10*3/uL (ref 0.0–0.2)
EOS ABS: 0.112 10*3/uL (ref 0.1–0.3)
EOSINOPHIL: 2 % (ref 1–6)
HCT: 38.3 % — ABNORMAL LOW (ref 39.8–50.2)
HGB: 13.1 g/dL (ref 13.1–17.3)
LYMPHOCYTES: 28 % (ref 20–45)
MCH: 28.4 pg (ref 27.4–33.0)
MCHC: 34.3 g/dL (ref 31.6–35.5)
MCV: 82.8 fL (ref 82.0–99.0)
MONOCYTES: 9 % (ref 4–13)
MONOS ABS: 0.635 10*3/uL (ref 0.1–0.9)
MPV: 8.2 FL (ref 7.4–10.4)
NRBC'S: 0 /100{WBCs}
PLATELET COUNT: 196 10*3/uL (ref 140–450)
PMN ABS: 4.25 10*3/uL (ref 1.5–7.7)
RBC: 4.63 MIL/uL (ref 4.46–5.70)
WBC: 6.9 10*3/uL (ref 3.5–11.0)

## 2011-08-30 LAB — BASIC METABOLIC PANEL
ANION GAP: 10 mmol/L (ref 5–16)
BUN/CREAT RATIO: 15 (ref 6–22)
BUN: 18 mg/dL (ref 8–26)
CALCIUM: 8.9 mg/dL (ref 8.5–10.4)
CARBON DIOXIDE: 24 mmol/L (ref 23–33)
CHLORIDE: 103 mmol/L (ref 96–111)
CREATININE: 1.23 mg/dL (ref 0.62–1.27)
ESTIMATED GLOMERULAR FILTRATION RATE: 58 mL/min/{1.73_m2} — ABNORMAL LOW (ref >59)
GLUCOSE,NONFAST: 125 mg/dL (ref 65–139)
POTASSIUM: 3.7 mmol/L (ref 3.5–5.1)
SODIUM: 137 mmol/L (ref 136–145)

## 2011-08-30 LAB — CREATINE KINASE (CK), MB FRACTION, SERUM
CK-MB: 2.5 ng/mL (ref <6.4)
CK-MB: 2.7 ng/mL (ref <6.4)
MB INDEX: 6.9 % — ABNORMAL HIGH (ref 0.0–5.0)

## 2011-08-30 LAB — PERFORM POC WHOLE BLOOD GLUCOSE
GLUCOSE, POINT OF CARE: 135 mg/dL — ABNORMAL HIGH (ref 70–105)
GLUCOSE, POINT OF CARE: 188 mg/dL — ABNORMAL HIGH (ref 70–105)

## 2011-08-30 LAB — CREATINE KINASE (CK), TOTAL, SERUM: CREATINE KINASE (CK): 37 U/L — ABNORMAL LOW (ref 48–222)

## 2011-08-30 LAB — TROPONIN-I
TROPONIN-I: 0.089 ng/mL (ref <0.030)
TROPONIN-I: 0.167 ng/mL (ref <0.030)

## 2011-08-30 LAB — PHOSPHORUS: PHOSPHORUS: 3.4 mg/dL (ref 2.4–4.7)

## 2011-08-30 LAB — CREATINE KINASE (CK), TOTAL, SERUM OR PLASMA: CREATINE KINASE (CK): 36 U/L — ABNORMAL LOW (ref 48–222)

## 2011-08-30 LAB — MAGNESIUM: MAGNESIUM: 1.9 mg/dL (ref 1.7–2.5)

## 2011-08-30 MED ORDER — CLOPIDOGREL 75 MG TABLET
75.0000 mg | ORAL_TABLET | Freq: Every day | ORAL | Status: DC
Start: 2011-08-30 — End: 2013-07-21

## 2011-08-30 MED ORDER — HYDROCHLOROTHIAZIDE 25 MG TABLET
25.0000 mg | ORAL_TABLET | Freq: Every day | ORAL | Status: DC
Start: 2011-08-30 — End: 2011-09-25

## 2011-08-30 MED ORDER — NITROGLYCERIN 0.4 MG SUBLINGUAL TABLET
0.40 mg | SUBLINGUAL_TABLET | SUBLINGUAL | Status: DC | PRN
Start: 2011-08-30 — End: 2017-05-20

## 2011-08-30 MED ORDER — METOPROLOL TARTRATE 12.5 MG HALF TAB
12.50 mg | ORAL_TABLET | Freq: Two times a day (BID) | ORAL | Status: DC
Start: 2011-08-30 — End: 2011-09-25

## 2011-08-30 NOTE — Care Plan (Signed)
Problem: General Plan of Care(Adult,OB)  Goal: Plan of Care Review(Adult,OB)  The patient and/or their representative will communicate an understanding of their plan of care   Outcome: Ongoing (see interventions/notes)  Patient OOB with assist. No complaints of pain. Assessment for flowsheet. VSS

## 2011-08-30 NOTE — Nurses Notes (Signed)
Dr. Franky Macho made aware that pt's POC FS resulting in "LOW". Dr. Franky Macho made aware that pt is AxOx4, not diaphoretic, calm, answers questions appropriately, and able to follow commands.  Pt given two cans of orange juice and graham crackers.

## 2011-08-30 NOTE — Discharge Summary (Addendum)
DISCHARGE SUMMARY      PATIENT NAMEAlin, [name]  MRN:  [id number]  DOB:      ADMISSION DATE:  08/28/2011  DISCHARGE DATE:  08/30/2011    ATTENDING PHYSICIAN: Maurilio Lovely, MD  PRIMARY CARE PHYSICIAN: None Given     ADMISSION DIAGNOSIS: LBBB (left bundle branch block)  DISCHARGE DIAGNOSIS: LBBB s/p DES x2 to LAD and DES x1 to RCA    Active Hospital Problems    Diagnosis Date Noted   . Principle Problem: LBBB (left bundle branch block) 08/28/2011   . Elevated troponin 08/29/2011   . HTN (hypertension) 08/29/2011   . Diabetes mellitus 08/29/2011      Resolved Hospital Problems    Diagnosis    No resolved problems to display.     Active Non-Hospital Problems    Diagnosis Date Noted   . Elevated troponin 08/29/2011   . HTN (hypertension) 08/29/2011   . Diabetes mellitus 08/29/2011   . LBBB (left bundle branch block) 08/28/2011      DISCHARGE MEDICATIONS:  Discharge Medication List as of 08/30/2011  1:13 PM      START taking these medications    Details   clopidogrel (PLAVIX) 75 mg Oral Tablet Take 1 Tab (75 mg total) by mouth Once a day., Disp-30 Tab, R-5, Print      hydrochlorothiazide (HYDRODIURIL) 25 mg Oral Tablet Take 1 Tab (25 mg total) by mouth Once a day., Disp-30 Tab, R-5, Print      metoprolol (LOPRESSOR) 12.5 mg Oral Tablet Take 1 Tab (12.5 mg total) by mouth every 12 hours., Disp-30 Tab, R-5, Print      nitroglycerin (NITROSTAT) 0.4 mg Sublingual Tablet, Sublingual 1 Tab (0.4 mg total) by Sublingual route Every 5 minutes as needed for Chest pain. for 3 doses over 15 minutes, Disp-30 Tab, R-5, Print         CONTINUE these medications which have NOT CHANGED    Details   metFORMIN (GLUCOPHAGE) 850 mg Oral Tablet Take 850 mg by mouth Twice daily with food., Historical Med      lisinopril (PRINIVIL) 5 mg Oral Tablet Take 5 mg by mouth Once a day., Historical Med      aspirin 81 mg Oral Tablet, Chewable Take 81 mg by mouth Once a day., Historical Med           DISCHARGE INSTRUCTIONS:      SCHEDULE FOLLOW-UP - CARDIOLOGY - [facility] - [facility]   Reason for visit: HOSPITAL DISCHARGE    Followup reason: New LBBB  s/p DES x2 to LAD and DES x1 to RCA    Follow-up in: 4 WEEKS      REASON FOR HOSPITALIZATION AND HOSPITAL COURSE:  This is a 71 y.o., male with a history of Hypertension, Diabetes, and history Kidney stones who presented as a transfer from [facility] for possible left catheterization due to identification of new LBBB on EKG with elevated troponins (0.076 -> 0.109). He has never had any chest pain. However he did experience dizziness and decreased blood pressures on the day of admission to [facility]. He did have a little back tenderness which he attributes to lying in the hospital bed. He denied any shortness of breath, abdominal pain, nausea, vomiting, diarrhea, and constipation, bloody stools, or urinary symptoms. Serial cardiac enzymes were monitored and was 0.068 on admission and remained within that range. Patient underwent cath 2/15 with DES x2 to LAD and DES x1 to RCA. He tolerated the procedure well.  The remainder of the patient's hospital stay has been unremarkable. He will be discharged to home today stable and improved. He will follow up with Dr. Dellia Beckwith in [place] in 4 weeks from hospital discharge.    CONDITION ON DISCHARGE:  A. Ambulation: Full ambulation  B. Self-care Ability: Complete  C. Cognitive Status Alert and Oriented x 3    DISCHARGE DISPOSITION:  Home discharge     cc: Primary Care Physician:  None Given  No address on file     [id number] Physician:  Gweneth Fritter, PA-C   DEPT OF CARDIOLOGY  [address]  [address]  [place], [place] [address]     Armando Gang, MD    Referring providers can utilize [email or web address] to access their referred [employer] patient's information.       Late entry for 08/30/11. I saw and examined the patient.  I reviewed the resident's note.  I agree with the findings and plan of care as documented in the resident's note.  Any exceptions/additions are edited/noted.    Maurilio Lovely, MD 08/31/2011, 2:01 PM

## 2011-08-30 NOTE — Nurses Notes (Signed)
830 Pt can be impulsive and states he is confused. Pt correctly answered where he is, what year its, and who he is and why he is admitted in hospital.  Pt states understanding and was able to return demonstrate where nursing call light button is. Pt stated understanding and was able to repeat instructions to press call light button for assist out of bed.

## 2011-08-30 NOTE — Nurses Notes (Signed)
1630 Pt states understanding and was able to repeat discharge instructions regarding medication, activity, follow up appointment, and not continue Metformin until 09/01/2011.  Pt and family denied questions.  PIV removed by pt at 1500 today with sheath intact and no signs of infiltration.

## 2011-08-30 NOTE — Nurses Notes (Signed)
Dr. Wynona Neat made aware that pt states this morning he is confused and that pt's family is concerned about his confusion. Abbot also made aware that pt was AxOx4 during my assessment this morning.

## 2011-08-30 NOTE — Progress Notes (Addendum)
[facility]   Cardiology   Progress Note      [name]  Date of Admission:  08/28/2011  Date of service: 08/30/2011  Date of Birth:    MRN:  [id number]    Hospital Day:  LOS: 2 days   Subjective: 71 y.o. male with a history of Hypertension, Diabetes, and history Kidney stones. who presents as a transfer from [facility] for possible left catheterization due to identification of new LBBB on EKG with elevated troponins (0.076 to 0.109). Patient underwent cath 2/15 with DES x2 to LAD and DES x1 to RCA. Patient today feeling better. Denies having chest pain or SOB. No acute issues overnight. No other complaints.    Objective:     Vital Signs:  Temp (24hrs) Max:37.2 C (99 F)      Temperature: 37.1 C (98.8 F) (08/30/11 0735)  BP (Non-Invasive): 159/95 mmHg (rn notified) (08/30/11 0735)  MAP (Non-Invasive): 109 mmHG (08/29/11 1508)  Heart Rate: 80  (08/30/11 0735)  Respiratory Rate: 20  (08/30/11 0735)  Pain Score: 0 (08/30/11 0400)  SpO2-1: 96 % (08/30/11 0735)  Physical Exam:  General: no acute distress and appears stated age  Lungs: clear to auscultation bilaterally, no wheezes or crackles  Cardiovascular: regular rate and rhythm, no murmurs  Abdomen: soft, non-tender, bowel sounds normal and non-distended  Extremities: No edema or cyanosis  Neurologic: grossly normal, CN II - XII grosly intact  and alert and oriented x3    I/O:  I/O last 24 hours:    Intake/Output Summary (Last 24 hours) at 08/30/11 0825  Last data filed at 08/30/11 4098   Gross per 24 hour   Intake   1430 ml   Output    400 ml   Net   1030 ml     I/O current shift:     Blood Sugars:   Point of Care Glucose Last 24 Hr Results:    Recent Labs   Basename 08/30/11 0648 08/29/11 2105 08/29/11 1909 08/29/11 1131    GLUCOSEPOC 135* 167* 117* 168*       Labs:  I have reviewed all lab results.  Lab Results for Last 24 Hours:     Results for orders placed during the hospital encounter of 08/28/11 (from the past 24 hour(s))   POCT ISTAT ACT CELITE POINT OF CARE       Component Value Range    ACT CELITE, POC 180 (*) <150 Seconds   POCT ISTAT ACT CELITE POINT OF CARE       Component Value Range    ACT CELITE, POC 210 (*) <150 Seconds   POCT ISTAT ACT CELITE POINT OF CARE       Component Value Range    ACT CELITE, POC 253 (*) <150 Seconds   CREATINE KINASE (CK), TOTAL       Component Value Range    CREATINE KINASE (CK) 21 (*) 48 - 222 U/L   CREATINE KINASE (CK) MB ISOENZYME       Component Value Range    CK-MB 1.2  <6.4 ng/mL    MB INDEX 5.7 (*) 0.0 - 5.0 %   POCT WHOLE BLOOD GLUCOSE       Component Value Range    GLUCOSE, POINT OF CARE 168 (*) 70 - 105 mg/dL   CREATINE KINASE (CK) MB ISOENZYME       Component Value Range    CK-MB 2.1  <6.4 ng/mL    MB INDEX 7.0 (*)  0.0 - 5.0 %   CREATINE KINASE (CK), TOTAL       Component Value Range    CREATINE KINASE (CK) 30 (*) 48 - 222 U/L   TROPONIN-I       Component Value Range    TROPONIN-I 0.059 (*) <0.030 ng/mL   CREATINE KINASE (CK), TOTAL       Component Value Range    CREATINE KINASE (CK) 32 (*) 48 - 222 U/L   CREATINE KINASE (CK) MB ISOENZYME       Component Value Range    CK-MB 2.3  <6.4 ng/mL    MB INDEX 7.2 (*) 0.0 - 5.0 %   TROPONIN-I       Component Value Range    TROPONIN-I 0.051 (*) <0.030 ng/mL   POCT WHOLE BLOOD GLUCOSE       Component Value Range    GLUCOSE, POINT OF CARE 117 (*) 70 - 105 mg/dL   POCT WHOLE BLOOD GLUCOSE       Component Value Range    GLUCOSE, POINT OF CARE 167 (*) 70 - 105 mg/dL   CREATINE KINASE (CK) MB ISOENZYME       Component Value Range    CK-MB 2.3  <6.4 ng/mL    MB INDEX 6.4 (*) 0.0 - 5.0 %   CREATINE KINASE (CK), TOTAL       Component Value Range    CREATINE KINASE (CK) 36 (*) 48 - 222 U/L   TROPONIN-I       Component Value Range    TROPONIN-I 0.067 (*) <0.030 ng/mL   CBC/DIFF       Component Value Range    WBC 6.9  3.5 - 11.0 THOU/uL     RBC 4.63  4.46 - 5.70 MIL/uL    HGB 13.1  13.1 - 17.3 g/dL    HCT 16.1 (*) 09.6 - 50.2 %    MCV 82.8  82.0 - 99.0 fL    MCH 28.4  27.4 - 33.0 pg    MCHC 34.3  31.6 - 35.5 g/dL    RDW 04.5  40.9 - 81.1 %    PLATELET COUNT 196  140 - 450 THOU/uL    MPV 8.2  7.4 - 10.4 FL    PMN'S 61  40 - 75 %    PMN ABS 4.250  1.5 - 7.7 THOU/uL    LYMPHOCYTES 28  20 - 45 %    LYMPHS ABS 1.910  1.0 - 4.8 THOU/uL    MONOCYTES 9  4 - 13 %    MONOS ABS 0.635  0.1 - 0.9 THOU/uL    EOSINOPHIL 2  1 - 6 %    EOS ABS 0.112  0.1 - 0.3 THOU/uL    BASOPHILS 0  0 - 1 %    BASOS ABS 0.023  0.0 - 0.2 THOU/uL    NRBC'S 0  0 /100WBC   BASIC METABOLIC PANEL, NON-FASTING       Component Value Range    SODIUM 137  136 - 145 mmol/L    POTASSIUM 3.7  3.5 - 5.1 mmol/L    CHLORIDE 103  96 - 111 mmol/L    CARBON DIOXIDE 24  23 - 33 mmol/L    ANION GAP 10  5 - 16 mmol/L    CREATININE 1.23  0.62 - 1.27 mg/dL    ESTIMATED GLOMERULAR FILTRATION RATE 58 (*) >59 ml/min/1.65m2    GLUCOSE,NONFAST 125  65 - 139 mg/dL    BUN  18  8 - 26 mg/dL    BUN/CREAT RATIO 15  6 - 22    CALCIUM 8.9  8.5 - 10.4 mg/dL   MAGNESIUM       Component Value Range    MAGNESIUM 1.9  1.7 - 2.5 mg/dL   PHOSPHORUS       Component Value Range    PHOSPHORUS 3.4  2.4 - 4.7 mg/dL   CREATINE KINASE (CK), TOTAL       Component Value Range    CREATINE KINASE (CK) 36 (*) 48 - 222 U/L   CREATINE KINASE (CK) MB ISOENZYME       Component Value Range    CK-MB 2.5  <6.4 ng/mL    MB INDEX 6.9 (*) 0.0 - 5.0 %   TROPONIN-I       Component Value Range    TROPONIN-I 0.089 (*) <0.030 ng/mL   POCT WHOLE BLOOD GLUCOSE       Component Value Range    GLUCOSE, POINT OF CARE 135 (*) 70 - 105 mg/dL     Chest x-ray (1/61/0960): no acute cardiopulmonary processes   EKG (08/28/2011): LBBB   Cath (08/29/2011):   FINDINGS:   HEMODYNAMICS: Central aortic pressure 159/83 with a mean of 112.   LEFT MAIN CORONARY ARTERY: Angiographically normal.    LEFT CIRCUMFLEX CORONARY ARTERY: 20% proximal stenosis, mid-to-distal vessel 20% focal stenosis. The first obtuse marginal branch is subtotally occluded with a 99% stenosis and TIMI-1 flow. OM2 branch had 30% proximal stenosis. It divided into 2 subbranches with luminal irregularities only.   LEFT ANTERIOR DESCENDING CORONARY ARTERY: There is 50-60% stenosis after the takeoff of 2 major diagonal branches that came off proximally in the proximal mid. There was a focal 70-80% mid stenosis. There was distal 70-80% stenosis. The first diagonal branch had an 80% ostial stenosis. The second diagonal branch had luminal irregularities only. The third diagonal branch was small in caliber.   RIGHT CORONARY ARTERY: Was a moderate caliber vessel. It was dominant. There was a mid-to-distal 80-90% stenosis. The PDA had luminal irregularities only. PLB was a small vessel.   INTRAVASCULAR ULTRASOUND TO LEFT ANTERIOR DESCENDING CORONARY ARTERY: Mid vessel had a 3.4 mm2 inner luminal area with a reference vessel 3-3.5 mm in diameter.   PERCUTANEOUS CORONARY INTERVENTION TO THE LEFT ANTERIOR DESCENDING CORONARY ARTERY: Please see dictation.   PERCUTANEOUS CORONARY INTERVENTION TO THE RIGHT CORONARY ARTERY: Please see dictation.   IMPRESSION: Successful 2-vessel percutaneous coronary intervention with drug-eluting stents x3.   RECOMMENDATIONS:   1. Aspirin, Plavix and Integrilin.   2. Risk factor modification and medical therapy for coronary artery disease.   3. Follow up with Dr. Dellia Beckwith.   4. Continue monitoring in stepdown unit for 24 hours or until tomorrow.    Current Medications:    Current Facility-Administered Medications:  hydrochlorothiazide (HYDRODIURIL) tablet 25 mg Oral Daily   heparin 5,000 unit/mL injection 5,000 Units 5,000 Units Subcutaneous Q8HRS   ioversol (OPTIRAY 320) infusion 100 mL Intra-arterial Give in Cath Lab 3rd floor   NS premix infusion  Intravenous Give in Cath Lab 3rd floor    clopidogrel (PLAVIX) 75 mg tablet 75 mg Oral Daily   ondansetron (ZOFRAN) 2 mg/mL injection 4 mg Intravenous Q6H PRN   NS flush syringe 2 mL Intracatheter Q8HRS   And      NS flush syringe 2-6 mL Intracatheter Q1 MIN PRN   aspirin chewable tablet 81 mg 81 mg Oral Daily   lisinopril (PRINIVIL) tablet 5  mg Oral Daily   SSIP insulin lispro (HUMALOG) 100 units/mL injection 2-6 Units Subcutaneous 4x/day PRN   nitroglycerin (NITROSTAT) sublingual tablet 0.4 mg Sublingual Q5 Min PRN   metoprolol tartrate (LOPRESSOR) tablet 12.5 mg Oral Q12H       No Known Allergies      Assessment/ Plan:  Active Hospital Problems    Diagnosis   . Primary Problem: LBBB (left bundle branch block)   . Elevated troponin   . HTN (hypertension)   . Diabetes mellitus       71 y.o. male with a past medical history of HTN and DM who presents from [facility] with new LBBB on EKG with Troponin of 0.109 presenting with dizziness and hypotension (self reported) at the [facility] that resolved. Never had any chest pain prior to and throughout his hospitalization.     1. New LBBB with Elevated Troponin:   - Chest x-ray: (-) for any acute cardiopulmonary processes.   - Patient received loading dose of Plavix from [facility].   - EKG demonstrated LBBB at [facility]. Repeat EKG here also LBBB.   - Serial troponins: 0.109 (from [facility]) then trending (0.68 - 0.69 - 0.066).   - Continue Aspirin 81 mg daily, Plavix 75 mg daily, Nitrostat PRN, and Metoprolol 12.5 mg BID.   - Patient underwent cath 2/15 s/p DES x2 to LAD and DES x1 to RCA.   - Discharge to home today. Patient will follow up with Dr. Dellia Beckwith in 4 weeks from hospital discharge.     2. Hypokalemia/Hypomagnesemia:   - Resolved.    3. HTN:   - Blood pressures elevated since admission.   - Continue home Lisinopril 5 mg, HCTZ 25 mg oral daily.   - Added Metoprolol 12.5 mg BID.     4. Diabetes:   - Fingersticks TID AC HS.   - SSI while inpatient.    ASA:  yes  Beta Blocker:  yes   ACEI/ARB:  yes  Statin:  no  Plavix: no  Spironolactone Indicated (Heart Failure): no  DVT/PE Prophylaxis: Not indicated    Disposition Planning: Home discharge      Armando Gang, MD 08/30/2011, 8:25 AM        Late entry for 08/30/11. I saw and examined the patient.  I reviewed the resident's note.  I agree with the findings and plan of care as documented in the resident's note.  Any exceptions/additions are edited/noted.    Maurilio Lovely, MD 08/31/2011, 2:02 PM

## 2011-08-30 NOTE — Nurses Notes (Signed)
1500 Dr. Franky Macho made aware that pt was assessed for confusion after he removed his peripheral IV.  Pt assessed and was AxOx4, stated that he came from [facility] and was moved to [facility] where he is now because of blood pressure issues.  Pt correctly named the current president.  When asked why he removed his peripheral IV, the pt stated, "Becuase I wanted to take a shower."

## 2011-08-30 NOTE — Nurses Notes (Signed)
1215 Dr. Franky Macho made aware that pt's POC FS 188.

## 2011-08-30 NOTE — Discharge Instructions (Signed)
Discharge Instructions for the Patient with the TR Band     A.  Instruct patient to do no lifting with affected arm for 48 hours    B   Instruct patient to apply manual pressure and call physician immediately if bleeding or hematoma occur at the site.    C.  Instruct patient to remove dressing the next day and keep site clean, dry, and covered with a new band aid daily until healed.      D.  Instruct patient to avoid submersion of site in water for 3 days.    E. Instruct patient to report any symptoms of pain, tingling, pallor, swelling, cold limb, and signs and symptoms of infection to their physician.

## 2011-09-04 ENCOUNTER — Inpatient Hospital Stay (HOSPITAL_COMMUNITY): Payer: Medicare Other

## 2011-09-04 ENCOUNTER — Observation Stay (HOSPITAL_COMMUNITY): Payer: Medicare Other

## 2011-09-04 ENCOUNTER — Observation Stay
Admission: AD | Admit: 2011-09-04 | Discharge: 2011-09-06 | Disposition: A | Discharge status: Home / Self Care | Payer: Medicare Other | Source: Other Acute Inpatient Hospital

## 2011-09-04 ENCOUNTER — Encounter (HOSPITAL_COMMUNITY): Payer: Self-pay

## 2011-09-04 DIAGNOSIS — E119 Type 2 diabetes mellitus without complications: Secondary | ICD-10-CM | POA: Insufficient documentation

## 2011-09-04 DIAGNOSIS — Z8249 Family history of ischemic heart disease and other diseases of the circulatory system: Secondary | ICD-10-CM | POA: Insufficient documentation

## 2011-09-04 DIAGNOSIS — R4182 Altered mental status, unspecified: Principal | ICD-10-CM | POA: Insufficient documentation

## 2011-09-04 DIAGNOSIS — I252 Old myocardial infarction: Secondary | ICD-10-CM | POA: Insufficient documentation

## 2011-09-04 DIAGNOSIS — F29 Unspecified psychosis not due to a substance or known physiological condition: Secondary | ICD-10-CM | POA: Insufficient documentation

## 2011-09-04 DIAGNOSIS — R413 Other amnesia: Secondary | ICD-10-CM | POA: Insufficient documentation

## 2011-09-04 DIAGNOSIS — Z9861 Coronary angioplasty status: Secondary | ICD-10-CM | POA: Insufficient documentation

## 2011-09-04 LAB — PERFORM POC WHOLE BLOOD GLUCOSE: GLUCOSE, POINT OF CARE: 142 mg/dL — ABNORMAL HIGH (ref 70–105)

## 2011-09-04 MED ORDER — CLOPIDOGREL 75 MG TABLET
75.0000 mg | ORAL_TABLET | Freq: Every day | ORAL | Status: DC
Start: 2011-09-05 — End: 2011-09-06
  Administered 2011-09-05 – 2011-09-06 (×2): 75 mg via ORAL
  Filled 2011-09-04 (×2): qty 1

## 2011-09-04 MED ORDER — NITROGLYCERIN 0.4 MG SUBLINGUAL TABLET
0.40 mg | SUBLINGUAL_TABLET | SUBLINGUAL | Status: DC | PRN
Start: 2011-09-04 — End: 2011-09-06

## 2011-09-04 MED ORDER — SODIUM CHLORIDE 0.9 % (FLUSH) INJECTION SYRINGE
2.0000 mL | INJECTION | INTRAMUSCULAR | Status: DC | PRN
Start: 2011-09-04 — End: 2011-09-06

## 2011-09-04 MED ORDER — SODIUM CHLORIDE 0.9 % INTRAVENOUS SOLUTION
INTRAVENOUS | Status: DC
Start: 2011-09-04 — End: 2011-09-06

## 2011-09-04 MED ORDER — SODIUM CHLORIDE 0.9 % (FLUSH) INJECTION SYRINGE
2.0000 mL | INJECTION | Freq: Three times a day (TID) | INTRAMUSCULAR | Status: DC
Start: 2011-09-04 — End: 2011-09-06
  Administered 2011-09-04: 2 mL
  Administered 2011-09-05 – 2011-09-06 (×4): 0 mL

## 2011-09-04 MED ORDER — INSULIN LISPRO 100 UNIT/ML SUB-Q SSIP
2.00 [IU] | INJECTION | Freq: Four times a day (QID) | SUBCUTANEOUS | Status: DC | PRN
Start: 2011-09-04 — End: 2011-09-06
  Filled 2011-09-04: qty 3

## 2011-09-04 MED ORDER — HYDROCHLOROTHIAZIDE 25 MG TABLET
25.0000 mg | ORAL_TABLET | Freq: Every day | ORAL | Status: DC
Start: 2011-09-05 — End: 2011-09-06
  Administered 2011-09-05 – 2011-09-06 (×2): 25 mg via ORAL
  Filled 2011-09-04 (×2): qty 1

## 2011-09-04 MED ORDER — ONDANSETRON HCL (PF) 4 MG/2 ML INJECTION SOLUTION
4.0000 mg | Freq: Four times a day (QID) | INTRAMUSCULAR | Status: DC | PRN
Start: 2011-09-04 — End: 2011-09-06

## 2011-09-04 MED ORDER — LISINOPRIL 5 MG TABLET
5.0000 mg | ORAL_TABLET | Freq: Every day | ORAL | Status: DC
Start: 2011-09-05 — End: 2011-09-06
  Administered 2011-09-05 – 2011-09-06 (×2): 5 mg via ORAL
  Filled 2011-09-04 (×2): qty 1

## 2011-09-04 MED ORDER — METOPROLOL TARTRATE 12.5 MG HALF TAB
12.50 mg | ORAL_TABLET | Freq: Two times a day (BID) | ORAL | Status: DC
Start: 2011-09-04 — End: 2011-09-06
  Administered 2011-09-04 – 2011-09-06 (×4): 12.5 mg via ORAL
  Filled 2011-09-04 (×5): qty 1

## 2011-09-04 MED ORDER — ASPIRIN 81 MG CHEWABLE TABLET
81.0000 mg | CHEWABLE_TABLET | Freq: Every day | ORAL | Status: DC
Start: 2011-09-05 — End: 2011-09-06
  Administered 2011-09-05 – 2011-09-06 (×2): 81 mg via ORAL
  Filled 2011-09-04 (×2): qty 1

## 2011-09-04 NOTE — H&P (Addendum)
[facility] : [facility]   Cardiology Admission  History and Physical      [name], [name], 71 y.o. male  Date of Admission:  09/04/2011  Date of Birth:    PCP:  None Given    Information Obtained from: patient, significant other and history reviewed via medical record  Chief Complaint:  Altered Mental Status    HPI: Patient is a 71 year old white male with known CAD s/p PCI with DES x2 to LAD and DES x1 to RCA (08/29/2011), HTN, and DM type 2 who presented as a transfer from [facility] for altered mental status. He was recently admitted to Cardiology service for new onset LBBB and underwent cardiac cath on 2/15 and was sent home the following day stable and chest pain free. His girlfriend and sister noted that he seemed confused at times but thought that this was just transient. Per the sister and girlfriend, they noted the patient to be confused on most times, often not remembering if he has already taken his medications and having difficulty orienting himself to time and place. They reported that the patient always had a sharp memory and that this was new to him. He denies having any weakness, focal neurologic deficits, slurring of speech, LOC, or seizure like activity. On interview, the patient was slow to answer questions and would seem to have difficulty in recalling details. He was admitted to [facility] due to persistence of symptoms. MRI of brain (per documented report from transfer notes) showed atrophy with small vessel disease. Carotid duplex revealed mild atherosclerotic plaques in the carotids, no increase in flow velocities, with 1-39% stenosis bilaterally. Patient was then transferred to [facility] for further workup, possible TEE, and Neurology referral.    Cardiac Risk Factors:    Past cardiac history: Yes  Current/Recent Smoker (within 1 year): No  Hypertension:   (BP 140/90): Yes  Dyslipidemia: (Total Chol greater than 200/LDL greater than 130 mg/dL): No   Fam Hx of Premature CAD:(male less than 59yrs; male less than 66 yrs): Yes  Cerebrovascular Disease :  No  Peripheral Arterial Disease: No  Diabetes Mellitus: Yes  Currently On Dialysis:  No  Chronic Lung Disease:  No  Cardiac Arrest w/in 24 Hours: No  Prior history of MI:   Yes  Prior PCI: Yes: Date: 08/29/2011  MI this admission: No  CAD Presentation:  No symptoms or angina in last 14 days  Anginal Classification w/in 2 Weeks:  No symptoms  Prior CABG: No  Prior Valve Surgery/Procedure:  No  Pre-operative Evaluation before Non-Cardiac Surgery: No  Prior Heart Failure: No  Cardiomyopathy: No  LV Systolic Dysfunction:  No      Past Medical History   Diagnosis Date   . HTN (hypertension)    . Diabetes mellitus    . Wears glasses    . Wears dentures      Past Surgical History   Procedure Date   . Hx lithotripsy    . Hx appendectomy    . Hx tonsillectomy    . Hx adenoidectomy        Prior to Admission Medications:  Medications Prior to Admission     Medication    clopidogrel (PLAVIX) 75 mg Oral Tablet    Take 1 Tab (75 mg total) by mouth Once a day.    hydrochlorothiazide (HYDRODIURIL) 25 mg Oral Tablet    Take 1 Tab (25 mg total) by mouth Once a day.    metoprolol (LOPRESSOR)  12.5 mg Oral Tablet    Take 1 Tab (12.5 mg total) by mouth every 12 hours.    nitroglycerin (NITROSTAT) 0.4 mg Sublingual Tablet, Sublingual    1 Tab (0.4 mg total) by Sublingual route Every 5 minutes as needed for Chest pain. for 3 doses over 15 minutes    metFORMIN (GLUCOPHAGE) 850 mg Oral Tablet    Take 850 mg by mouth Twice daily with food.    lisinopril (PRINIVIL) 5 mg Oral Tablet    Take 5 mg by mouth Once a day.    aspirin 81 mg Oral Tablet, Chewable    Take 81 mg by mouth Once a day.        No Known Allergies  Dye Allergy:  No  Iodine Allergy:  No    Family History  Family History   Problem Relation Age of Onset   . Coronary Artery Disease Other      Grandmother       Social History  History     Social History    . Marital Status: Single     Spouse Name: N/A     Number of Children: N/A   . Years of Education: N/A     Occupational History   . Not on file.     Social History Main Topics   . Smoking status: Never Smoker    . Smokeless tobacco: Never Used   . Alcohol Use: 0.5 oz/week     1 Cans of beer per week   . Drug Use: No   . Sexually Active: Not on file     Other Topics Concern   . Not on file     Social History Narrative   . No narrative on file       ROS: Other than ROS in the HPI, all other review of systems were negative except for: Neurological: positive for memory problems    Exam:  Temperature: 36.9 C (98.4 F)  Heart Rate: 63   BP (Non-Invasive): 146/70 mmHg  Respiratory Rate: 18   General: appears stated age and no distress  Eyes: Conjunctiva clear.  HENT:Head atraumatic and normocephalic  Neck: No JVD  Lungs: Clear to auscultation bilaterally. no wheezes or crackles  Cardiovascular: regular rate and rhythm  S1, S2 normal  no murmurs  Abdomen: Soft, non-tender, Bowel sounds normal, non-distended  Extremities: No cyanosis or edema  Skin: Skin warm and dry  Neurologic: Grossly normal, CN II - XII grossly intact , No tremor, Mental status: alert, oriented, thought content appropriate and alert, oriented to person but not to place and time, patient kept on asking which hospital he is at right now, Sensory: intact to light touch and proprioception, Motor: No weakness. , Reflexes: Normal.  Lymphatics: No lymphadenopathy  Psychiatric: Normal    Labs:    I have reviewed all lab results.  Lab Results for Last 24 Hours:    Results for orders placed during the hospital encounter of 09/04/11 (from the past 24 hour(s))   POCT WHOLE BLOOD GLUCOSE       Component Value Range    GLUCOSE, POINT OF CARE 142 (*) 70 - 105 mg/dL     Imaging Studies:  CXR:  pending    Previous echo results:    N/A    Previous ECG results (08/29/2011):    Sinus rhythm  Left bundle branch block  Left atrial abnormality     Previous cath results (08/29/2011):    FINDINGS:   HEMODYNAMICS:  Central aortic pressure 159/83 with a mean of 112.   LEFT MAIN CORONARY ARTERY: Angiographically normal.   LEFT CIRCUMFLEX CORONARY ARTERY: 20% proximal stenosis, mid-to-distal vessel 20% focal stenosis. The first obtuse marginal branch is subtotally occluded with a 99% stenosis and TIMI-1 flow. OM2 branch had 30% proximal stenosis. It divided into 2 subbranches with luminal irregularities only.   LEFT ANTERIOR DESCENDING CORONARY ARTERY: There is 50-60% stenosis after the takeoff of 2 major diagonal branches that came off proximally in the proximal mid. There was a focal 70-80% mid stenosis. There was distal 70-80% stenosis. The first diagonal branch had an 80% ostial stenosis. The second diagonal branch had luminal irregularities only. The third diagonal branch was small in caliber.   RIGHT CORONARY ARTERY: Was a moderate caliber vessel. It was dominant. There was a mid-to-distal 80-90% stenosis. The PDA had luminal irregularities only. PLB was a small vessel.   INTRAVASCULAR ULTRASOUND TO LEFT ANTERIOR DESCENDING CORONARY ARTERY: Mid vessel had a 3.4 mm2 inner luminal area with a reference vessel 3-3.5 mm in diameter.   PERCUTANEOUS CORONARY INTERVENTION TO THE LEFT ANTERIOR DESCENDING CORONARY ARTERY: Please see dictation.   PERCUTANEOUS CORONARY INTERVENTION TO THE RIGHT CORONARY ARTERY: Please see dictation.   IMPRESSION: Successful 2-vessel percutaneous coronary intervention with drug-eluting stents x3.   RECOMMENDATIONS:   1. Aspirin, Plavix and Integrilin.   2. Risk factor modification and medical therapy for coronary artery disease.   3. Follow up with Dr. Dellia Beckwith.   4. Continue monitoring in stepdown unit for 24 hours or until tomorrow.    Previous stess results:    N/A    Patient has decision making capacity:  Yes  Code Status:  Full Code    Assessment/Plan:  Active Hospital Problems    Diagnosis   . Altered mental status      71 year old white male with known CAD s/p PCI with DES x2 to LAD and DES x1 to RCA (08/29/2011), HTN, and DM type 2 who presented as a transfer from [facility] for altered mental status.    1. Confusion/Altered Mental Status:  - Admit the patient to Cardiology under Dr. Marshell Garfinkel.  - Continuous telemetry.  - Monitor VS q4, pulse ox q4.  - Patient was recently noted to have altered mental status after PCI which has persisted until this admission. Workup from [facility] (MRI of brain and Carotid duplex) have not been impressive as to the etiology of his confusion.  - Will consult Neurology for further evaluation.    2. CAD s/p PCI with DES x2 to LAD and DES x1 to RCA:  - Patient currently on Aspirin 81 mg daily, Plavix 75 mg daily, Lopressor 12.5 mg BID, and Nitro SL PRN.  - He has not been complaining of chest pain since cardiac cath.  - Will continue home meds.    3. HTN:  - Continue home Lisinopril 5 mg daily, HCTZ 25 mg daily, and Lopressor 12.5 mg daily.    4. DM type 2:  - Patient was on Metformin 850 mg BID. Will hold PO antihyperglycemics for now.  - Will order SSI instead.  - Check fingersticks TID AC HS.      DVT/PE Prophylaxis: SCDs/ Boyce Medici, MD      Late entry for 09/04/11. I saw and examined the patient.  I reviewed the resident's note.  I agree with the findings and plan of care as documented in the resident's note.  Any exceptions/additions are edited/noted.    Maurilio Lovely, MD 09/05/2011, 2:31 PM

## 2011-09-04 NOTE — Care Plan (Signed)
 Problem: General Plan of Care(Adult,OB)  Goal: Plan of Care Review(Adult,OB)  The patient and/or their representative will communicate an understanding of their plan of care   Outcome: Ongoing (see interventions/notes)  71 year old male with new unset of confusion. Is having trouble with short term memories will reorient with each encounter.

## 2011-09-04 NOTE — Consults (Addendum)
[name]  Neurology Initial Consult      [name], 71 y.o. male  Date of Admission:  09/04/2011  Date of service: 09/04/2011  Date of Birth:      PCP: None Given  Consult Requested By: Med Cardiology    Information obtained from: patient and significant other  Chief Complaint:  Memory loss    [id number] N [name] is a 71 y.o., White male who presents with memory loss and confusion. He has a history of CAD s/p stent (08/29/11), DM type 2, HTN. Patient's sister is [name] (phone [phone]).    When [name] returned from cardiac cath, he had confusion, but his family thought this was transient. When he returned home, he continued to have memory issues. The patient did not remember getting his cardiac stents. He would repeatedly ask "What time is it?" and would repeatedly state that he had to take his BP medications.He would also have episodes of standing up, then he'd "stand there like he forgot what he was doing" before resuming activity. No tongue biting or urinary/fecal incontinence. The patient denies any loss of time. He was started on metoprolol and Plavix during his last hospitalization. MRI from [facility] note no acute process.    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 Per his girlfriend, he has no difficulty remembering to pay his bills. He works as a travelling Medical illustrator. Denies any car accidents.    Past Medical History   Diagnosis Date   . HTN (hypertension)    . Diabetes mellitus    . Wears glasses    . Wears dentures      Past Surgical History   Procedure Date   . Hx lithotripsy    . Hx appendectomy    . Hx tonsillectomy    . Hx adenoidectomy      Medications Prior to Admission     Medication    clopidogrel (PLAVIX) 75 mg Oral Tablet    Take 1 Tab (75 mg total) by mouth Once a day.     hydrochlorothiazide (HYDRODIURIL) 25 mg Oral Tablet    Take 1 Tab (25 mg total) by mouth Once a day.    metoprolol (LOPRESSOR) 12.5 mg Oral Tablet    Take 1 Tab (12.5 mg total) by mouth every 12 hours.    nitroglycerin (NITROSTAT) 0.4 mg Sublingual Tablet, Sublingual    1 Tab (0.4 mg total) by Sublingual route Every 5 minutes as needed for Chest pain. for 3 doses over 15 minutes    metFORMIN (GLUCOPHAGE) 850 mg Oral Tablet    Take 850 mg by mouth Twice daily with food.    lisinopril (PRINIVIL) 5 mg Oral Tablet    Take 5 mg by mouth Once a day.    aspirin 81 mg Oral Tablet, Chewable    Take 81 mg by mouth Once a day.        No Known Allergies  History   Substance Use Topics   . Smoking status: Never Smoker    . Smokeless tobacco: Never Used   . Alcohol Use: 0.5 oz/week     1 Cans of beer per week     Family History   Problem Relation Age of Onset   . Coronary Artery Disease Other      Grandmother       ROS:   Constitutional: No fever, chills, sweats. No weight  changes.  Eyes: No visual disturbances.  Ears, nose, mouth, throat, and face: No sore throat or tinnitus.  Respiratory: No cough, dyspnea.  Cardiovascular: No chest pain, palpitations.  Gastrointestinal: No nausea, vomiting, diarrhea, constipation. No hematochezia.  Genitourinary: No dysuria, hematuria, or discharge.  Integument/breast: No rash, lesions, changing moles.  Musculoskeletal: No myalgias or arthralgias.  Neurological: Per HPI.  Behavioral/Psych: No anxiety or depression.  Endocrine: History of diabetes.    Exam:  Temperature: 37 C (98.6 F)  Heart Rate: 59   BP (Non-Invasive): 134/73 mmHg  Respiratory Rate: 18   General: appears in good health  HENT:Head atraumatic and normocephalic  Neck: No JVD  Carotids:Carotids normal without bruit  Lungs: Clear to auscultation bilaterally.   Cardiovascular: regular rate and rhythm  Abdomen: Soft, non-tender  Extremities: No cyanosis or edema  Ophthalomscopic: normal w/o hemorrhages, exudates, or papilledema   Mental status:  Level of Consciousness: alert  Orientations: Alert and oriented x 3  MemoryRegistration 3 of 3 and Recall at 5 min. 0 of 3 with prompting  AttentionsAttention and Concentration are normal  Knowledge: Good  Language: Normal  Speech: Normal  Cranial nerves:   CN2: Visual acuity and fields intact  CN 3,4,6: EOMI, PERRLA  CN 5Facial sensation intact  CN 7Face symmetrical  CN 8: Hearing grossly intact  CN 9,10: Palate symmetric and gag normal  CN 11: Sternocleidomastoid and Trapezius have normal strength.  CN 12: Tongue normal with no fasiculations or deviation  Gait, Coordination, and Reflexes:   Gait: Normal  Coordination: Coordination is normal without tremor              Sensory: Sensory exam in the upper and lower extremities is normal    Muscle exam  Arm Right Left Leg Right Left   Deltoid 5/5 5/5 Iliopsoas 5/5 5/5   Biceps 5/5 5/5 Quads 5/5 5/5   Triceps 5/5 5/5 Hamstrings 5/5 5/5   Wrist Extension 5/5 5/5 Ankle Dorsi Flexion 5/5 5/5   Wrist Flexion 5/5 5/5 Ankle Plantar Flexion 5/5 5/5   Interossei 5/5 5/5 Ankle Eversion 5/5 5/5   APB 5/5 5/5 Ankle Inversion 5/5 5/5       Reflexes   RJ BJ TJ KJ AJ Plantars Hoffman's   Right 2+ 2+ 2+ 2+ 2+ Downgoing Not present   Left 2+ 2+ 2+ 2+ 2+ Downgoing Not present     Diabetes:   FOOTEXAM: Decreased sensation bilaterally      MMSE: 25    Labs:  Lab Results for Last 24 Hours:    Results for orders placed during the [name] encounter of 09/04/11 (from the past 24 hour(s))   POCT WHOLE BLOOD GLUCOSE       Component Value Range    GLUCOSE, POINT OF CARE 142 (*) 70 - 105 mg/dL   CREATINE KINASE (CK) MB ISOENZYME       Component Value Range    CK-MB 1.8  <6.4 ng/mL    MB INDEX 5.3 (*) 0.0 - 5.0 %   CREATINE KINASE (CK), TOTAL       Component Value Range    CREATINE KINASE (CK) 34 (*) 48 - 222 U/L   CBC/DIFF       Component Value Range    WBC 6.1  3.5 - 11.0 THOU/uL    RBC 4.38 (*) 4.46 - 5.70 MIL/uL    HGB 12.6 (*) 13.1 - 17.3 g/dL    HCT 16.1 (*) 09.6 - 50.2 %  MCV 82.1  82.0 - 99.0 fL    MCH 28.8  27.4 - 33.0 pg    MCHC 35.1  31.6 - 35.5 g/dL    RDW 47.8  29.5 - 62.1 %    PLATELET COUNT 183  140 - 450 THOU/uL    MPV 8.2  7.4 - 10.4 FL    PMN'S 63  40 - 75 %    PMN ABS 3.880  1.5 - 7.7 THOU/uL    LYMPHOCYTES 24  20 - 45 %    LYMPHS ABS 1.440  1.0 - 4.8 THOU/uL    MONOCYTES 9  4 - 13 %    MONOS ABS 0.529  0.1 - 0.9 THOU/uL    EOSINOPHIL 3  1 - 6 %    EOS ABS 0.199  0.1 - 0.3 THOU/uL    BASOPHILS 1  0 - 1 %    BASOS ABS 0.042  0.0 - 0.2 THOU/uL    NRBC'S 0  0 /100WBC   BASIC METABOLIC PANEL, NON-FASTING       Component Value Range    SODIUM 136  136 - 145 mmol/L    POTASSIUM 3.7  3.5 - 5.1 mmol/L    CHLORIDE 106  96 - 111 mmol/L    CARBON DIOXIDE 23  23 - 33 mmol/L    ANION GAP 7  5 - 16 mmol/L    CREATININE 0.94  0.62 - 1.27 mg/dL    ESTIMATED GLOMERULAR FILTRATION RATE >59  >59 ml/min/1.55m2    GLUCOSE,NONFAST 114  65 - 139 mg/dL    BUN 13  8 - 26 mg/dL    BUN/CREAT RATIO 14  6 - 22    CALCIUM 8.8  8.5 - 10.4 mg/dL   MAGNESIUM       Component Value Range    MAGNESIUM 1.8  1.7 - 2.5 mg/dL   PHOSPHORUS       Component Value Range    PHOSPHORUS 3.1  2.4 - 4.7 mg/dL       Review of reports and notes reveal:   Carotid duplex: 1-39% stenosis bilaterally    Independent Interpretation of images or specimens:  MRI brain from [facility] 2/20: No acute process.     Impressions/Recommendations:  Rocket Gunderson is a 71 year old man with memory loss and confusion. He has a history of CAD s/p stent (08/29/11), DM type 2, HTN.    Memory difficulty  - Ddx include seizure activity, dementia, transient global amnesia  - MRI brain: no acute process  - Neuro checks, seizure precautions  - EEG awake and asleep pending.   - B 12, VDRL, LFTs, TSH.  - please also do repeat MRI/MRA head and neck w/wo contrast if not possible CTA head and neck.     Kaylyn Lim, MD 09/04/2011, 11:49 PM  Kaylyn Lim, MD  [facility]  Neurology, PGY-2    Addendum:     Please obtain CTA head/neck and also check thiamine, folate, Urine drug screen and infectious/metabolic workup if patient not back to baseline.  - please also do MRI/MRA head and neck w/wo contrast if not possible CTA head and neck.     Marcelino Freestone, MD 09/05/2011, 9:45 AM                I saw and examined the patient.  I reviewed the resident's note.  I agree with the findings and plan of care as documented in the resident's note.  Any  exceptions/additions are edited/noted.    Reyes Ivan, MD 09/05/2011, 1:22 PM

## 2011-09-05 ENCOUNTER — Inpatient Hospital Stay (HOSPITAL_COMMUNITY): Payer: Medicare Other

## 2011-09-05 ENCOUNTER — Observation Stay (INDEPENDENT_AMBULATORY_CARE_PROVIDER_SITE_OTHER): Payer: Medicare Other

## 2011-09-05 LAB — CREATINE KINASE (CK), TOTAL, SERUM: CREATINE KINASE (CK): 36 U/L — ABNORMAL LOW (ref 48–222)

## 2011-09-05 LAB — BASIC METABOLIC PANEL
ANION GAP: 7 mmol/L (ref 5–16)
BUN/CREAT RATIO: 14 (ref 6–22)
BUN: 13 mg/dL (ref 8–26)
CALCIUM: 8.8 mg/dL (ref 8.5–10.4)
CARBON DIOXIDE: 23 mmol/L (ref 23–33)
CHLORIDE: 106 mmol/L (ref 96–111)
CREATININE: 0.94 mg/dL (ref 0.62–1.27)
ESTIMATED GLOMERULAR FILTRATION RATE: >59 ml/min/1.73m2 (ref >59)
GLUCOSE,NONFAST: 114 mg/dL (ref 65–139)
POTASSIUM: 3.7 mmol/L (ref 3.5–5.1)
SODIUM: 136 mmol/L (ref 136–145)

## 2011-09-05 LAB — CBC/DIFF
BASOPHILS: 1 % (ref 0–1)
BASOS ABS: 0.042 10*3/uL (ref 0.0–0.2)
EOS ABS: 0.199 10*3/uL (ref 0.1–0.3)
EOSINOPHIL: 3 % (ref 1–6)
HCT: 35.9 % — ABNORMAL LOW (ref 39.8–50.2)
HGB: 12.6 g/dL — ABNORMAL LOW (ref 13.1–17.3)
LYMPHS ABS: 1.44 10*3/uL (ref 1.0–4.8)
MCH: 28.8 pg (ref 27.4–33.0)
MCHC: 35.1 g/dL (ref 31.6–35.5)
MCV: 82.1 fL (ref 82.0–99.0)
MPV: 8.2 FL (ref 7.4–10.4)
NRBC'S: 0 /100{WBCs}
PLATELET COUNT: 183 THOU/uL (ref 140–450)
PMN ABS: 3.88 10*3/uL (ref 1.5–7.7)
RBC: 4.38 MIL/uL — ABNORMAL LOW (ref 4.46–5.70)
RDW: 12.8 % (ref 10.2–14.0)
WBC: 6.1 10*3/uL (ref 3.5–11.0)

## 2011-09-05 LAB — TROPONIN-I
TROPONIN-I: 0.016 ng/mL (ref <0.030)
TROPONIN-I: 0.013 ng/mL (ref <0.030)

## 2011-09-05 LAB — CREATINE KINASE (CK), TOTAL, SERUM OR PLASMA: CREATINE KINASE (CK): 34 U/L — ABNORMAL LOW (ref 48–222)

## 2011-09-05 LAB — GAMMA GT: GAMMA GT: 25 U/L (ref 7–50)

## 2011-09-05 LAB — MAGNESIUM: MAGNESIUM: 1.8 mg/dL (ref 1.7–2.5)

## 2011-09-05 LAB — CREATINE KINASE (CK), MB FRACTION, SERUM
CK-MB: 1.8 ng/mL (ref <6.4)
CK-MB: 1.7 ng/mL (ref <6.4)
MB INDEX: 5.3 % — ABNORMAL HIGH (ref 0.0–5.0)
MB INDEX: 4.7 % (ref 0.0–5.0)

## 2011-09-05 LAB — PHOSPHORUS: PHOSPHORUS: 3.1 mg/dL (ref 2.4–4.7)

## 2011-09-05 LAB — AST (SGOT): AST (SGOT): 15 U/L (ref 8–48)

## 2011-09-05 LAB — PERFORM POC WHOLE BLOOD GLUCOSE
GLUCOSE, POINT OF CARE: 123 mg/dL — ABNORMAL HIGH (ref 70–105)
GLUCOSE, POINT OF CARE: 105 mg/dL (ref 70–105)
GLUCOSE, POINT OF CARE: 96 mg/dL (ref 70–105)

## 2011-09-05 LAB — FOLATE: FOLATE: 17.4 ng/mL (ref >4.50)

## 2011-09-05 LAB — LDH: LDH: 106 U/L (ref 98–192)

## 2011-09-05 LAB — VITAMIN B12: VITAMIN B12: 183 pg/mL (ref 180–914)

## 2011-09-05 LAB — ALT (SGPT): ALT (SGPT): 11 U/L (ref 7–55)

## 2011-09-05 LAB — TOTAL PROTEIN: TOTAL PROTEIN: 5.3 g/dL — ABNORMAL LOW (ref 6.4–8.3)

## 2011-09-05 LAB — BILIRUBIN TOTAL: BILIRUBIN, TOTAL: 0.8 mg/dL (ref 0.3–1.3)

## 2011-09-05 LAB — ALK PHOS (ALKALINE PHOSPHATASE): ALKALINE PHOSPHATASE: 56 U/L (ref 38–126)

## 2011-09-05 LAB — THYROID STIMULATING HORMONE WITH FREE T4 REFLEX: THYROID STIMULATING HORMONE WITH FREE T4 REFLEX: 1.515 u[IU]/mL (ref 0.300–5.900)

## 2011-09-05 MED ORDER — IOPAMIDOL 370 MG IODINE/ML (76 %) INTRAVENOUS SOLUTION
INTRAVENOUS | Status: AC
Start: 2011-09-05 — End: 2011-09-05
  Filled 2011-09-05: qty 50

## 2011-09-05 MED ORDER — IOPAMIDOL 370 MG IODINE/ML (76 %) INTRAVENOUS SOLUTION
70.00 mL | INTRAVENOUS | Status: AC
Start: 2011-09-05 — End: 2011-09-05
  Administered 2011-09-05: 70 mL via INTRAVENOUS

## 2011-09-05 MED ORDER — GADOBENATE DIMEGLUMINE 529 MG/ML(0.1 MMOL/0.2 ML) INTRAVENOUS SOLUTION
20.00 mL | INTRAVENOUS | Status: AC
Start: 2011-09-05 — End: 2011-09-05
  Administered 2011-09-05: 19:00:00 20 mL via INTRAVENOUS

## 2011-09-05 NOTE — Progress Notes (Addendum)
[facility]   MED CARDIOLOGY PROGRESS NOTE    Name:  [name] Bed: 801/A   Age:  71 y.o. Date of Birth:     Gender:  male MRN: [id number]    Dx at Admission: Altered Mental Status, s/p stent placement 2/15  Date of Admission:  09/04/2011   Date of Service: 09/05/2011  Hospital Day #:   LOS: 1 day    CC: Confusion/Memory loss.    SUBJECTIVE:  Patient is alert and oriented today and answering questions appropriately. No chest pain, shortness of breath. Overall he is feeling well.    OBJECTIVE:  Vital Signs:  Last 24 Hours Last Check   Temp  Avg: 36.8 C (98.3 F)  Min: 36.7 C (98.1 F)  Max: 37 C (98.6 F) Temperature: 36.8 C (98.2 F) (09/05/11 0751)   Pulse  Avg: 65   Min: 59   Max: 75  Heart Rate: 63  (09/05/11 0751)   BP  Min: 134/73  Max: 163/74 BP (Non-Invasive): 158/77 mmHg (09/05/11 0751)   Resp  Avg: 17.5   Min: 16   Max: 18  Respiratory Rate: 16  (09/05/11 0751)    No Data Recorded     No Data Recorded Pain Score: 0 (09/05/11 0000)   Height: 182.9 cm (6') Base Weight (ADM): 84.8 kg (186 lb 15.2 oz) Weight: 84.8 kg (186 lb 15.2 oz) (09/04/11 2026)     Fi02:      BM:    Last Bowel Movement: 09/04/11    NPO       Blood Sugars:   Recent Labs   Basename 09/05/11 0616 09/04/11 2201    GLUCOSEPOC 123* 142*       IV Drips   Medication Dose Frequency Last Rate   . NS premix infusion   Continuous 50 mL/hr at 09/04/11 2100     Examination:  Physical Exam:  General: Pleasant and cooperative male in no acute distress   Eyes: Conjunctivae/corneas clear, PERRLA, EOM's intact. Fundi benign., Sclera non-icteric.    HENT: Head atraumatic and normocephalic, ENT without erythema or injection, mucous membranes moist.   Neck: Neck supple, no JVD or thyromegaly or lymphadenopathy   Lungs: Clear to auscultation bilaterally.  No crackles, rales or wheezing   Cardiovascular: regular rate and rhythm, S1, S2 normal, no murmur, click, rub or gallop    Abdomen: Soft, non-tender, Bowel sounds normal, non-distended, No hepatosplenomegaly   Extremities: No cyanosis or edema; pulses 2/2 in UE/LE, Strength 5/5 in UE/LE   Skin: Skin warm and dry, No rashes and No lesions   Neurologic: CN II - XII grossly intact , Alert and oriented x3, reflexes 2/2 in UE/LE     Labs:    CBC:  Recent Labs   Basename 09/05/11 0149    WBC 6.1    HGB 12.6*    HCT 35.9*    PLTCNT 183    BANDS --     Differential:   Recent Labs   Basename 09/05/11 0149    PMNS 63    LYMPHOCYTES 24    MONOCYTES 9    EOSINOPHIL 3    BASOPHILS 1    NRBCS 0    PMNABS 3.880    LYMPHSABS 1.440    MONOSABS 0.529    EOSABS 0.199    BASOSABS 0.042     BMP:  Recent Labs   Basename 09/05/11 0149    SODIUM 136    POTASSIUM 3.7  CHLORIDE 106    CO2 23    BUN 13    CREATININE 0.94    GLUCOSENF 114    ANIONGAP 7    GFR >59     Recent Labs   [facility] 09/05/11 0149    CALCIUM 8.8    MAGNESIUM 1.8    PHOSPHORUS 3.1     RBC Indices:  Recent Labs   Basename 09/05/11 0149    MCV 82.1    MCH 28.8    MCHC 35.1    RDW 12.8    MPV 8.2     Liver/Pancreas:  Recent Labs   Basename 09/05/11 0429    TOTALPROTEIN 5.3*    ALBUMIN --    PREALBUMIN --    AST 15    ALT 11    ALKPHOS 56    LDH 106    AMYLASE --    LIPASE --    TOTBILIRUBIN 0.8    BILIRUBINCON --     Cardiac Markers  Recent Labs   [facility] 09/05/11 0429 09/05/11 0149    TROPONINI 0.013 0.016    CKMB 1.7 1.8    MBINDEX 4.7 5.3*    BNP -- --   TSH: 1.515  B12: 183  VDRL: pending    Imaging Studies:       EKG:  Recent Results (from the past 720 hour(s))   ECG 12-LEAD    Collection Time    08/30/11  7:08 AM    Narrative:     Ventricular Rate 71 BPM  Atrial Rate 71 BPM  P-R Interval 156 ms  QRS Duration 140 ms  QT 426 ms  QTc 462 ms  P Axis 44 degrees  R Axis 18 degrees  T Axis -101 degrees  Sinus rhythm  Left bundle branch block  Left atrial abnormality  Confirmed by Raphael Gibney MD, ABNASH ([id number]), editor Mayford Knife, MORIAH ([id number]) on 08/31/2011 12:11:01 PM     Consults:    IP CONSULT TO NEUROLOGY   Memory difficulty   - Ddx include seizure activity, dementia, transient global amnesia   - MRI brain: no acute process   - Neuro checks, seizure precautions   - EEG awake and asleep   - B 12, VDRL, LFTs, TSH.    Current Inpatient Medications:     Current Facility-Administered Medications:  ondansetron (ZOFRAN) 2 mg/mL injection 4 mg Intravenous Q6H PRN   NS flush syringe 2 mL Intracatheter Q8HRS   And      NS flush syringe 2-6 mL Intracatheter Q1 MIN PRN   NS premix infusion  Intravenous Continuous   aspirin chewable tablet 81 mg 81 mg Oral Daily   clopidogrel (PLAVIX) 75 mg tablet 75 mg Oral Daily   hydrochlorothiazide (HYDRODIURIL) tablet 25 mg Oral Daily   lisinopril (PRINIVIL) tablet 5 mg Oral Daily   metoprolol tartrate (LOPRESSOR) tablet 12.5 mg Oral Q12H   nitroglycerin (NITROSTAT) sublingual tablet 0.4 mg Sublingual Q5 Min PRN   SSIP insulin lispro (HUMALOG) 100 units/mL injection 2-6 Units Subcutaneous 4x/day PRN     No Known Allergies    ASSESSMENT/PLAN:    Active Hospital Problems    Diagnosis   . Altered mental status     71 year old white male with known CAD s/p PCI with DES x2 to LAD and DES x1 to RCA (08/29/2011), HTN, and DM type 2 who presented as a transfer from [facility] for altered mental status.   1. Confusion/Altered Mental Status (alert and oriented today without  any focal neurological deficits)  - Telemetry.   - Patient was recently noted to have altered mental status after PCI which has persisted until this admission. Workup from [facility] (MRI of brain and Carotid duplex) have not been impressive as to the etiology of his confusion.   - Follow Neuro recs: Neuro checks, seizure precautions, EEG awake and asleep, B12, VDRL, LFTs, TSH  2. CAD s/p PCI with DES x2 to LAD and DES x1 to RCA:   - Patient currently on Aspirin 81 mg daily, Plavix 75 mg daily, Lopressor 12.5 mg BID, and Nitro SL PRN.    - He has not been complaining of chest pain since cardiac cath.   - Will continue home meds.   3. HTN:   - Continue home Lisinopril 5 mg daily, HCTZ 25 mg daily, and Lopressor 12.5 mg daily.   4. DM type 2:   - Patient was on Metformin 850 mg BID. Will hold PO antihyperglycemics for now.   - Will order SSI instead.   - Check fingersticks TID AC HS.   DVT/PE Prophylaxis: SCDs/ Venodynes    Prophylaxis:   ASA:  yes  Beta Blocker:  yes  ACEI/ARB:  yes  Statin:  yes  Plavix: yes  Spironolactone Indicated (Heart Failure): no  DVT/PE: SCDs/ Venodynes    Disposition Planning: Home discharge     Kriste Basque, MD 09/05/2011 8:16 AM  PGY-I  Department of Internal Medicine  [facility]      I saw and examined the patient.  I reviewed the resident's note.  I agree with the findings and plan of care as documented in the resident's note.  Any exceptions/additions are edited/noted.    Maurilio Lovely, MD 09/05/2011, 5:28 PM

## 2011-09-05 NOTE — Ancillary Notes (Signed)
DIABETES EDUCATION CENTER   Met with [name] Laffey to assess diabetes education needs.  He states that he has had diabetes for past 4 years. A1C was 6.7% on 08/29/2011.  Diabetes Care Checklist Reviewed with Patient (Know Your A1C, Blood Pressure, Cholesterol, Eye Exam, Foot Care and Kidney Health)  Katherine Roan RD, LD, CDE  Diabetes Education Center  Phone: [phone]  Pager #: [phone]

## 2011-09-05 NOTE — Care Management Notes (Signed)
Care Coordinator/Social Work Plan  [place]  Patient Name: [name]   MRN: [id number]   Acct Number: [id number]  DOB:  Age: 71  **Admission Information**  Patient Type: INPATIENT  Admit Date: 09/04/2011 Admit Time: 20:12  Admit Reason: Altered Mental Status, s/p stent placeme  Admitting Phys: Maurilio Lovely   Attending Phys: Maurilio Lovely   Unit: 8W Bed: 801-A  210. Readmission Assessment  Created by : Darletta Moll Date/Time 2011-09-05 14:03:47.070  Readmission Assessment  2. What do you think contributed to your rehospitalization? Were your symptoms the same as before?  Note: no now with AMS  3. Did your support systems work? If not, how can we help with that?  Yes   4. Did you understand your discharge instructions? Do you know when to call your MD ? Yes   5. Are you responsible for setting up/taking your own medications? Is this working?  No family helps  6. Did you call your PCP prior to coming to the ED? What was his/her response?  No   7. Were you appointments scheduled before you were discharged from the hospital?    Yes   8. Were you able to make your hospital follow up appointments?  No to soon  10. Did you have any limitations/hindrances for a successful discharge? Cost of meds, food, transpo  No

## 2011-09-05 NOTE — Nurses Notes (Signed)
Patient has been NPO all nigiht.

## 2011-09-05 NOTE — Care Plan (Signed)
Problem: General Plan of Care(Adult,OB)  Goal: Plan of Care Review(Adult,OB)  The patient and/or their representative will communicate an understanding of their plan of care   Outcome: Ongoing (see interventions/notes)  Pt presents from [facility] with altered mental status noted by family, s/p stent placement on 2/15. Plan neurology consulted, EEG. MRI negative. Discharge home no needs  Discharge Plan: Home(Patient/Family Member/other) (code 1)

## 2011-09-05 NOTE — Care Management Notes (Signed)
[facility]  Care Management Initial Evaluation    Patient Name: [name]  Date of Birth:   Sex: male  Date/Time of Admission: 09/04/2011  8:12 PM  Room/Bed: 801/A  Payor: MEDICARE  Plan: MEDICARE PART A AND B  Product Type: Medicare      [name] is a 71 y.o., male, admitted from [facility] with AMS    Height/Weight: 182.9 cm (6') / 84.8 kg (186 lb 15.2 oz)    Subjective/Objective:    09/05/11 1136   Assessment Details   Assessment Type Admission   Date of Care Management Update 09/05/11   Date of Next DCP Update 09/10/11   Care Management Plan   Discharge Planning Status initial meeting   Projected Discharge Date 09/08/11   Initial Interview In lieu of initial interview, CM will monitor for revisions in the clinical/discharge plan of care.   Discharge Needs Assessment   Discharge Facility/Level Of Care Needs Home (Patient/Family Member/other) (code 1)         LOS: 1 day   Admitting Diagnosis: Altered Mental Status, s/p stent placement 2/15  Assessment: Pt presents from [facility] with altered mental status noted by family, s/p stent placement on 2/15. Plan neurology consulted, EEG.  MRI negative.  Discharge home no needs    Discharge Plan:  Home(Patient/Family Member/other) (code 1)      The patient will continue to be evaluated for developing discharge needs.     Case Manager: Darletta Moll, RN 09/05/2011, 11:37 AM  Phone: [phone]

## 2011-09-05 NOTE — Procedures (Signed)
[facility]                                ELECTROENCEPHALOGRAM REPORT                                EEG\EMG Scheduling [phone]                                 STATUS: I      NAMELARELL, BANEY   NGEX#:[id number]  DATE: 09/05/2011  DOB :    SEX:M                  Tech:  AP. EEG #:  [id number].    REQUESTING PHYSICIAN:  Maurilio Lovely MD, [place], [facility].    DIAGNOSIS:  Normal awake and asleep.    REPORT:  This is a digitally acquired EEG performed using the typical 10-20 standard of electrode placement and it was started at 0934 and ended at 1017 hours.  The dominant background consists of an alpha rhythm which is 10 Hz, 30-50 microvolts, posteriorly predominant, symmetric and attenuates with eye opening.  There is a mild amount of low amplitude beta activity seen over the frontocentral head regions.  There is a mild amount of higher amplitude, higher frequency activity seen over the frontotemporal head regions suggestive of muscle artifact.    Photic stimulation produced a mild symmetric driving response.  Hyperventilation was not performed due to his age.  The patient appears to become drowsy through portions of the tracing as noted by attenuation of the alpha rhythm and slow eye movements of sleep.  Deeper stages of sleep are noted by sleep spindles.    No epileptiform discharges are noted.    CLINICAL INTERPRETATION:  This electroencephalogram is normal during periods of wakefulness and sleep.      Hosie Poisson, MD  Associate Professor  [facility] Department of Neurology    [id number]; D: 25/95/6387 11:00:47; T: 09/05/2011 14:20:51    cc: Evalina Field MD, FACP, [facility]      Shirleen Schirmer

## 2011-09-05 NOTE — Care Management Notes (Signed)
 Care Coordinator/Social Work Plan  [place]  Patient Name: [name]   MRN: [id number]   Acct Number: [id number]  DOB:  Age: 71  **Admission Information**  Patient Type: INPATIENT  Admit Date: 09/04/2011 Admit Time: 20:12  Admit Reason: Altered Mental Status, s/p stent placeme  Admitting Phys: Maurilio Lovely   Attending Phys: Maurilio Lovely   Unit: 8W Bed: 801-A  160. LOC Notification, CMS Important Message / Detailed Notice  Created by : Aggie Cosier Roman Date/Time 2011-09-05 11:01:39.670  Medicare Important Message/Detailed Notice  Admission- CMS-Important Message from Medicare About Your Rights (CMS-1405) IMM was delivered to the patient. A copy was  provided to the patient. (Date / Time) 022213 @ 815 am

## 2011-09-05 NOTE — Care Plan (Signed)
Problem: General Plan of Care(Adult,OB)  Goal: Plan of Care Review(Adult,OB)  The patient and/or their representative will communicate an understanding of their plan of care   Outcome: Ongoing (see interventions/notes)  Short term memory loss continues at times.  Intervention will continue and assess with each interaction.

## 2011-09-05 NOTE — Care Management Notes (Signed)
 Care Coordinator/Social Work Plan  [place]  Patient Name: [name]   MRN: [id number]   Acct Number: [id number]  DOB:  Age: 71  **Admission Information**  Patient Type: OBSERVATION  Admit Date: 09/04/2011 Admit Time: 20:12  Admit Reason: Altered Mental Status, s/p stent placeme  Admitting Phys: Maurilio Lovely   Attending Phys: Maurilio Lovely   Unit: 8W Bed: 801-A  160. LOC Notification, CMS Important Message / Detailed Notice  Created by : Darletta Moll Date/Time 2011-09-05 15:25:09.050  Medical Necessity and Level Of Care Notification  Level of Care Met with pt/family/other and provided education on OBS patient status   Level of Care Notification Patient notified of Level of Care status change to Observation (Date / Time) 09/05/11 @ 1520

## 2011-09-05 NOTE — Discharge Summary (Addendum)
DISCHARGE SUMMARY      PATIENT NAMEMarsh, Heckler  MRN:  [id number]  DOB:      ADMISSION DATE:  09/04/2011  DISCHARGE DATE:  2/222/2013    ATTENDING PHYSICIAN: Maurilio Lovely, MD  PRIMARY CARE PHYSICIAN: Ervin Knack Anger, MD     ADMISSION DIAGNOSIS: <principal problem not specified>  DISCHARGE DIAGNOSIS:   Active Hospital Problems    Diagnosis Date Noted   . Altered mental status 09/04/2011      Resolved Hospital Problems    Diagnosis    No resolved problems to display.     Active Non-Hospital Problems    Diagnosis Date Noted   . Elevated troponin 08/29/2011   . HTN (hypertension) 08/29/2011   . Diabetes mellitus 08/29/2011   . LBBB (left bundle branch block) 08/28/2011      DISCHARGE MEDICATIONS:  Current Discharge Medication List      CONTINUE these medications which have NOT CHANGED    Details   clopidogrel (PLAVIX) 75 mg Oral Tablet Take 1 Tab (75 mg total) by mouth Once a day.  Qty: 30 Tab, Refills: 5      hydrochlorothiazide (HYDRODIURIL) 25 mg Oral Tablet Take 1 Tab (25 mg total) by mouth Once a day.  Qty: 30 Tab, Refills: 5      metoprolol (LOPRESSOR) 12.5 mg Oral Tablet Take 1 Tab (12.5 mg total) by mouth every 12 hours.  Qty: 30 Tab, Refills: 5      nitroglycerin (NITROSTAT) 0.4 mg Sublingual Tablet, Sublingual 1 Tab (0.4 mg total) by Sublingual route Every 5 minutes as needed for Chest pain. for 3 doses over 15 minutes  Qty: 30 Tab, Refills: 5      metFORMIN (GLUCOPHAGE) 850 mg Oral Tablet Take 850 mg by mouth Twice daily with food.      lisinopril (PRINIVIL) 5 mg Oral Tablet Take 5 mg by mouth Once a day.      aspirin 81 mg Oral Tablet, Chewable Take 81 mg by mouth Once a day.           DISCHARGE INSTRUCTIONS:     AMB CONSULT/REFERRAL NEUROLOGY     DISCHARGE INSTRUCTION - ACTIVITY   Walking is encouraged.     Follow up as scheduled from previous discharge.    Follow up with neurology regarding test results.   Activity: z - other (specify in comments)       REASON FOR HOSPITALIZATION AND HOSPITAL COURSE:  This is a 71 y.o., male who was recently admitted to Cardiology service for new onset LBBB and underwent cardiac cath on 2/15 and was sent home the following day stable and chest pain free. He has known CAD s/p PCI with DES x2 to LAD and DES x1 to RCA (08/29/2011), HTN, and DM type 2 who presented as a transfer from [facility] for altered mental status.  His girlfriend and sister noted that he seemed confused at times but thought that this was just transient. On presentation to [facility] he was noted to be responding slowly but coherently.  Today on assessment, the patient is found to be A/O x 3 and responding appropriately.  He was seen by neurology and has undergone an EEG.  He is stable for discharge.        CONDITION ON DISCHARGE:  A. Ambulation: Patient is able to ambulate independently.      B. Self-care Ability: Patient is able to perform all aspects of self care independently.   C. Cognitive Status Patient  is alert and oriented to three spheres fully aware of their medical condition and treatment regimen.       DISCHARGE DISPOSITION:  Home discharge     cc: Primary Care Physician:  Toni Amend, MD  [address]  [place] [address]     UE:[id number] Physician:  Gweneth Fritter, PA-C  [place] [facility] C[facility]Y  [address]  [address]  [place], [place] [address]     Jude Guess, CFNP    Referring providers can utilize [email or web address] to access their referred [employer] patient's information.        I saw and examined the patient.  I reviewed the CFNP noteand agree with the findings and plan of care as documented in the [facility] note.  Any exceptions/additions are edited/noted.    Maurilio Lovely, MD 09/05/2011, 1:12 PM

## 2011-09-05 NOTE — Nurses Notes (Signed)
Patient arrived from [facility] via ambulance.  Patient in stable condition assessment per flow sheet.  Patient is alert and oriented does ask some questions over like he forgot he had ask.

## 2011-09-05 NOTE — Nurses Notes (Addendum)
Service notified at 1200 that patient is having difficulty with his short term memory, patient can not remember what I told him 5 minutes ago and is constantly asking the same questions over and over after being given the answer.  Patient able to answer all orientation questions appropriately. Orders received for CT Scan and MRI.  Will continue to monitor.

## 2011-09-05 NOTE — Ancillary Notes (Signed)
[facility] [employer]  Neurolab Tech Note    [name]  DATE OF SERVICE:  09/05/2011          EEG has been completed.      [name], END [facility] 09/05/2011, 1:11 PM

## 2011-09-05 NOTE — Care Management Notes (Signed)
Utilization Review Determination    SECTION I  Reason for Physician Advisor Referral: Does not meet screening criteria for admission. 09/04/11 (date)    Current MERLIN MD Order: [name]  Current [facility]LIN ADT Order: Inpt  Utilization Review Findings: OBS  Additional information available for medical team: Yes    Utilization Review MD: EHR PA reviewer Kim  Based on clinical information available to me as per above and in chart, the patient's status should be: Observation    Utilization Review RN: Sullivan Lone, RN                               Date/Time: 09/05/11 15;19  -------------------------------------------------------------------------------------------------------------------  1.  I have reviewed this case with the patient's treating physician Margit Hanks, and the MD agrees with this change in status.  They are aware of the referral to the Utilization Review Committee.    4.  MERLIN order changed: Yes       OBS Database: Yes       UDR Doc in Allscripts: Yes    5.  The patient has been notified on  (time) on 09/05/11 (day) of any changes in level of care. See DCP Allscripts Note        Patient Notification Doc in Allscripts:Yes    Utilization Review RN: Sullivan Lone, RN                                  Date/Time: 09/05/11 15:22  (Patient notification is required only if the status is changed while an Inpatient to Observation Status)  -------------------------------------------------------------------------------------------------------------------

## 2011-09-06 LAB — BASIC METABOLIC PANEL
ANION GAP: 6 mmol/L (ref 5–16)
BUN/CREAT RATIO: 12 (ref 6–22)
BUN: 12 mg/dL (ref 8–26)
CALCIUM: 8.7 mg/dL (ref 8.5–10.4)
CARBON DIOXIDE: 24 mmol/L (ref 23–33)
CHLORIDE: 102 mmol/L (ref 96–111)
CREATININE: 0.97 mg/dL (ref 0.62–1.27)
ESTIMATED GLOMERULAR FILTRATION RATE: >59 ml/min/1.73m2 (ref >59)
GLUCOSE,NONFAST: 169 mg/dL — ABNORMAL HIGH (ref 65–139)
POTASSIUM: 3.7 mmol/L (ref 3.5–5.1)

## 2011-09-06 LAB — CBC/DIFF
BASOS ABS: 0.05 THOU/uL (ref 0.0–0.2)
EOS ABS: 0.114 10*3/uL (ref 0.1–0.3)
EOSINOPHIL: 2 % (ref 1–6)
HCT: 34.8 % — ABNORMAL LOW (ref 39.8–50.2)
HGB: 12.2 g/dL — ABNORMAL LOW (ref 13.1–17.3)
MCH: 28.7 pg (ref 27.4–33.0)
MCHC: 34.9 g/dL (ref 31.6–35.5)
MCV: 82 fL (ref 82.0–99.0)
MONOCYTES: 10 % (ref 4–13)
MONOS ABS: 0.586 10*3/uL (ref 0.1–0.9)
MPV: 8.1 FL (ref 7.4–10.4)
NRBC'S: 0 /100{WBCs}
PLATELET COUNT: 184 THOU/uL (ref 140–450)
PMN ABS: 3.72 10*3/uL (ref 1.5–7.7)
PMN'S: 62 % (ref 40–75)
RBC: 4.24 MIL/uL — ABNORMAL LOW (ref 4.46–5.70)
RDW: 12.9 % (ref 10.2–14.0)
WBC: 5.9 10*3/uL (ref 3.5–11.0)

## 2011-09-06 LAB — PERFORM POC WHOLE BLOOD GLUCOSE: GLUCOSE, POINT OF CARE: 120 mg/dL — ABNORMAL HIGH (ref 70–105)

## 2011-09-06 LAB — MAGNESIUM: MAGNESIUM: 1.8 mg/dL (ref 1.7–2.5)

## 2011-09-06 LAB — PHOSPHORUS: PHOSPHORUS: 3.4 mg/dL (ref 2.4–4.7)

## 2011-09-06 MED ORDER — PRAVASTATIN 40 MG TABLET
40.00 mg | ORAL_TABLET | Freq: Every evening | ORAL | Status: DC
Start: 2011-09-06 — End: 2011-09-25

## 2011-09-06 NOTE — Progress Notes (Addendum)
[facility]   MED CARDIOLOGY PROGRESS NOTE    Name:  [name] Bed: 801/A   Age:  71 y.o. Date of Birth:     Gender:  male MRN: [id number]    Dx at Admission: Altered Mental Status, s/p stent placement 2/15  Date of Admission:  09/04/2011   Date of Service: 09/06/2011  Hospital Day #:   LOS: 2 days    CC: Confusion/Memory loss.    SUBJECTIVE:  Patient is alert and oriented today and answering questions appropriately. No chest pain, shortness of breath. Overall he is feeling well.    OBJECTIVE:  Vital Signs:  Last 24 Hours Last Check   Temp  Avg: 36.9 C (98.4 F)  Min: 36.8 C (98.2 F)  Max: 37.2 C (99 F) Temperature: 36.8 C (98.2 F) (09/06/11 0822)   Pulse  Avg: 61   Min: 56   Max: 73  Heart Rate: 57  (09/06/11 0822)   BP  Min: 102/56  Max: 175/66 BP (Non-Invasive): 125/58 mmHg (09/06/11 0822)   Resp  Avg: 18.3   Min: 18   Max: 20  Respiratory Rate: 18  (09/06/11 0822)    SpO2  Avg: 98 %  Min: 98 %  Max: 98 % SpO2-1: 98 % (09/06/11 0000)   No Data Recorded Pain Score: 0 (09/06/11 0000)   Height: 182.9 cm (6') Base Weight (ADM): 84.8 kg (186 lb 15.2 oz) Weight: 84.8 kg (186 lb 15.2 oz) (09/04/11 2026)     Fi02: Oxygen Therapy  SpO2-1: 98 %  $ O2 Delivery: None (Room Air)    BM:    Last Bowel Movement: 09/05/11    Blood Sugars:   Recent Labs   Basename 09/06/11 0654 09/05/11 2047 09/05/11 1907    GLUCOSEPOC 120* 96 105       IV Drips   Medication Dose Frequency Last Rate   . NS premix infusion   Continuous 50 mL/hr at 09/05/11 2040     Examination:  Physical Exam:  General: Pleasant and cooperative male in no acute distress   Eyes: Conjunctivae/corneas clear, PERRLA, EOM's intact. Fundi benign., Sclera non-icteric.    HENT: Head atraumatic and normocephalic, ENT without erythema or injection, mucous membranes moist.   Neck: Neck supple, no JVD or thyromegaly or lymphadenopathy   Lungs: Clear to auscultation bilaterally.  No crackles, rales or wheezing    Cardiovascular: regular rate and rhythm, S1, S2 normal, no murmur, click, rub or gallop   Abdomen: Soft, non-tender, Bowel sounds normal, non-distended, No hepatosplenomegaly   Extremities: No cyanosis or edema; pulses 2/2 in UE/LE, Strength 5/5 in UE/LE   Skin: Skin warm and dry, No rashes and No lesions   Neurologic: CN II - XII grossly intact , Alert and oriented x3, reflexes 2/2 in UE/LE     Labs:    CBC:  Recent Labs   [facility] 09/06/11 0209 09/05/11 0149    WBC 5.9 6.1    HGB 12.2* 12.6*    HCT 34.8* 35.9*    PLTCNT 184 183    BANDS -- --     Differential:   Recent Labs   [facility] 09/06/11 0209 09/05/11 0149    PMNS 62 63    LYMPHOCYTES 25 24    MONOCYTES 10 9    EOSINOPHIL 2 3    BASOPHILS 1 1    NRBCS 0 0    PMNABS 3.720 3.880    LYMPHSABS 1.450 1.440    MONOSABS  0.586 0.529    EOSABS 0.114 0.199    BASOSABS 0.050 0.042     BMP:  Recent Labs   [facility] [place] 09/06/11 0209 09/05/11 0149    SODIUM 132* 136    POTASSIUM 3.7 3.7    CHLORIDE 102 106    CO2 24 23    BUN 12 13    CREATININE 0.97 0.94    GLUCOSENF 169* 114    ANIONGAP 6 7    GFR >59 >59     Recent Labs   Basename 09/06/11 0209 09/05/11 0149    CALCIUM 8.7 8.8    MAGNESIUM 1.8 1.8    PHOSPHORUS 3.4 3.1     RBC Indices:  Recent Labs   Basename 09/06/11 0209 09/05/11 0149    MCV 82.0 82.1    MCH 28.7 28.8    MCHC 34.9 35.1    RDW 12.9 12.8    MPV 8.1 8.2     Liver/Pancreas:  Recent Labs   Basename 09/05/11 0429    TOTALPROTEIN 5.3*    ALBUMIN --    PREALBUMIN --    AST 15    ALT 11    ALKPHOS 56    LDH 106    AMYLASE --    LIPASE --    TOTBILIRUBIN 0.8    BILIRUBINCON --     Cardiac Markers  Recent Labs   Basename 09/05/11 0429 09/05/11 0149    TROPONINI 0.013 0.016    CKMB 1.7 1.8    MBINDEX 4.7 5.3*    BNP -- --   TSH: 1.515  B12: 183  VDRL: pending    Imaging Studies:    Results for orders placed during the hospital encounter of 09/04/11 (from the past 72 hour(s))   XR AP MOBILE CHEST     Status: Normal    Narrative:     [name]      EXAMINATION:  Chest, single frontal view, on September 04, 2011, at 2046   for an indication of chest pain.     Comparison is made with a prior examination on August 28, 2011.     FINDINGS:  The heart size is within normal limits and the pulmonary   vasculature is unremarkable with no evidence of pulmonary edema.  There is   no focal consolidation or pleural effusion.  No visible pneumothorax is   present.  Small calcified granulomas of the lungs bilaterally are noted.    Degenerative changes are present in the spine.       Impression:        No radiographic evidence of acute cardiopulmonary process.            CT ANGIO INTRACRANIAL W/WO IV CONTRAST     Status: Normal (Preliminary result)    Narrative:     EXAMINATION:  CT angio, intracranial, with and without IV contrast was   obtained on September 05, 2011, at 1429 hours.  The exam was compared to   MRI brain on the same date.     INDICATION:  Altered mental status.     TECHNIQUE:  70 mls of Isovue 370 was administered for the exam.     FINDINGS:     UNENHANCED CT OF THE BRAIN:  The ventricular system is within normal   limits.  No intraparenchymal masses, hemorrhages or abnormal fluid   collections are noted.  Gray-white differentiation is well preserved.    Patchy subcortical and deep white matter lucencies are noted in the  bilateral frontal and parietal lobes, likely representing chronic   microvascular ischemic changes.  Calcification in the left CPA is noted,   which can be related to calcified meningioma or left vestibular   schwannoma.  The bilateral paranasal sinuses and mastoid air cells are   well pneumatized.  Vascular calcifications are noted and are described as   below.     CT ANGIOGRAPHY OF THE HEAD:  There is normal formation of the   vertebrobasilar system.  The ACom is widely patent.  PComs are not   visualized bilaterally.  Atherosclerotic calcification of the bilateral    supraclinoid and cavernous ICA is noted.  Moderate stenosis in bilateral   supraclinoid ICA is noted.  Moderate stenosis in bilateral supraclinoid   ICA is noted, measuring at least 50%.  No vascular malformation or   occlusion is noted in the intracranial arteries.  Limited views of dural   venous sinuses demonstrate no large filling defects.       Impression:        1.  No acute intracranial process.  2.  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  3.  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            MRA INTRACRANIAL                     Status: Normal    Narrative:     MRA INTRACRANIAL                 performed on [name] on Sep 05, 2011  6:21 PM.      CLINICAL HISTORY:  Altered mental status    TECHNIQUE: 3-D time-of-flight MRA images of the brain.    COMPARISON: Magnetic resonance imaging brain same date. CTA of 09/05/2011    FINDINGS: The distal cervical segments, petrous, cavernous segments of the   internal carotid arteries are patent bilaterally. There is at least   moderate narrowing noted at the supraclinoid segments of the internal   carotid arteries appearing worse on the right side, which could be   exaggerated by susceptibility from atherosclerotic calcifications. The   middle and anterior cerebral arteries are patent. The vertebral arteries   and basilar arteries and posterior cerebral arteries are patent. However   there is small to moderate narrowing noted to the left posterior cerebral   artery distal to the  P1 P2 junction.        Impression:        1. Intracranial atherosclerosis with at least moderate stenosis noted at   the supraclinoid segments of the internal carotid arteries, with the   degree of stenosis could be exaggerated by the atherosclerotic vascular   wall calcifications.           MRI BRAIN W/WO CONTRAST     Status: Normal    Narrative:     MRI BRAIN W/WO CONTRAST performed on [name] on Sep 05, 2011    7:22 PM.     CLINICAL HISTORY:  altered mental status    INTRAVENOUS CONTRAST: 105ml's of Multihance.    LABS:   Last Creatinine: 0.97 mg/dL on 1/61/0960  Last GFR:  >59  Creat POCT:    Glucose POCT: GLUCOSEPOC     96 mg/dL on 4/54/0981    TECHNIQUE: Pre-contrast sagittal T1, and axial T1-w, T2-w, FLAIR, GRE, and   diffusion-w images of the brain with ADC maps.  Post contrast axial and   coronal T1 images were performed.    COMPARISON: CT angiogram of 09/05/2011    FINDINGS: Diffusion weighted images demonstrate an acute to subacute   lacunar infarct noted within the left cerebellar hemisphere. No acute   ischemia is demonstrated within the supratentorial brain.There are   scattered nonspecific foci of hyperintense T2 white matter signal changes,   which could represent chronic small vessel ischemic white matter changes.   There is no hydrocephalus. There is no acute intracranial hemorrhage.   Axial GRE images demonstrate focal areas of susceptibility noted within   the left cerebellopontine angle associated with small area of nodular   enhancement, which may represent a predominantly calcified meningioma or   vestibular schwannoma there is minimal mass effect noted on the seventh   and eighth nerve.    Postcontrast images also demonstrate enhancement of the left cerebellar   and posterior lacunar infarcts, indicating a subacute onset.        Impression:        1. A subacute left cerebellar hemispheric lacunar infarct.  2. redemonstration of the predominantly calcified minimally enhancing left   cerebellopontine angle mass, which may represents a vestibular schwannoma   or a meningioma.       MRA CAROTID-EXTRACRANIAL (NECK) W/WO CONTRAST     Status: Normal (Preliminary result)    Narrative:     MRA CAROTID-EXTRACRANIAL (NECK) W/WO CONTRAST performed on [name] on Sep 05, 2011  7:22 PM.    CLINICAL HISTORY:  Altered Mental status    INTRAVENOUS CONTRAST: 44ml's of Multihance.    LABS:    Last Creatinine: 0.97 mg/dL on 6/96/2952  Last GFR:  >59  Creat POCT:    Glucose POCT: GLUCOSEPOC     96 mg/dL on 8/41/3244    TECHNIQUE: 2-D time-of-flight MRA images and post contrast coronal T2   images with multiplanar MIP images.    COMPARISON: CT intracranial angiogram of 09/05/2011    FINDINGS: The aortic arch demonstrates atypical branch pattern. The   origins of the brachiocephalic, left common carotid artery and left   subclavian arteries are patent. The origins of the vertebral arteries are   patent. The cervical segments of the vertebral arteries are patent. The   common carotid arteries demonstrate atherosclerotic narrowing which is   mild on the right measuring less than 10% while on the left its   mild-to-moderate measuring about 30%.    Partially imaged intracranial vessels demonstrate mild-to-moderate   narrowing noted to the supraclinoid segments, left posterior cerebral   artery is, however better assess the accompanying MR a head images..      Impression:        1. mild atherosclerotic disease changes noted to the common carotid   arterial bifurcation, however measuring less than 30% on the left.       MRI/MRA results pending  EKG:  Recent Results (from the past 720 hour(s))   ECG 12-LEAD    Collection Time    09/04/11  8:58 PM    Narrative:     Ventricular Rate 63 BPM  Atrial Rate 63 BPM  P-R Interval 166 ms  QRS Duration 142 ms  QT 456 ms  QTc 466 ms  P Axis 45 degrees  R Axis 10 degrees  T Axis -162 degrees  Sinus rhythm  Left atrial abnormality  Left bundle branch block  Confirmed by [facility] MD, JASON ([id number]), editor REED, STEPHANIE ([id number]) on 09/05/2011 8:54:56  AM     Consults:   IP CONSULT TO NEUROLOGY   Memory difficulty   - Ddx include seizure activity, dementia, transient global amnesia   - MRI brain: no acute process   - Neuro checks, seizure precautions   - EEG awake and asleep   - B 12, VDRL, LFTs, TSH.  - Obtain CT angio/MRI/MRA of the head and neck  Current Inpatient Medications:      Current Facility-Administered Medications:  ondansetron (ZOFRAN) 2 mg/mL injection 4 mg Intravenous Q6H PRN   NS flush syringe 2 mL Intracatheter Q8HRS   And      NS flush syringe 2-6 mL Intracatheter Q1 MIN PRN   NS premix infusion  Intravenous Continuous   aspirin chewable tablet 81 mg 81 mg Oral Daily   clopidogrel (PLAVIX) 75 mg tablet 75 mg Oral Daily   hydrochlorothiazide (HYDRODIURIL) tablet 25 mg Oral Daily   lisinopril (PRINIVIL) tablet 5 mg Oral Daily   metoprolol tartrate (LOPRESSOR) tablet 12.5 mg Oral Q12H   nitroglycerin (NITROSTAT) sublingual tablet 0.4 mg Sublingual Q5 Min PRN   SSIP insulin lispro (HUMALOG) 100 units/mL injection 2-6 Units Subcutaneous 4x/day PRN     No Known Allergies    ASSESSMENT/PLAN:    Active Hospital Problems    Diagnosis   . Altered mental status     71 year old white male with known CAD s/p PCI with DES x2 to LAD and DES x1 to RCA (08/29/2011), HTN, and DM type 2 who presented as a transfer from [facility] for altered mental status.   1. Confusion/Altered Mental Status (alert and oriented today without any focal neurological deficits)  - Telemetry.   - Patient was recently noted to have altered mental status after PCI which has persisted until this admission. Workup from [facility] (MRI of brain and Carotid duplex) have not been impressive as to the etiology of his confusion.   - Follow Neuro recs: Neuro checks, seizure precautions, EEG awake and asleep, B12, VDRL, LFTs, TSH  - CT angio with PCA calcifications indicating Calcified Meningioma or vestibular schwanomma - benign finding per neurology  - MRI/MRA - old lacunar infarct  - Follow up with Neurology in 4 weeks. They will schedule Neuropsychiatric testing.  2. CAD s/p PCI with DES x2 to LAD and DES x1 to RCA:   - Patient currently on Aspirin 81 mg daily, Plavix 75 mg daily, Lopressor 12.5 mg BID, and Nitro SL PRN.   - He has not been complaining of chest pain since cardiac cath.    - Will continue home meds.   3. HTN:   - Continue home Lisinopril 5 mg daily, HCTZ 25 mg daily, and Lopressor 12.5 mg daily.   4. DM type 2:   - Patient was on Metformin 850 mg BID. Will hold PO antihyperglycemics for now.   - Will order SSI instead.   - Check fingersticks TID AC HS.   DVT/PE Prophylaxis: SCDs/ Venodynes    Prophylaxis:   ASA:  yes  Beta Blocker:  yes  ACEI/ARB:  yes  Statin:  yes  Plavix: yes  Spironolactone Indicated (Heart Failure): no  DVT/PE: SCDs/ Venodynes    Disposition Planning: Home discharge     Kriste Basque, MD 09/06/2011 8:42 AM  PGY-I  Department of Internal Medicine  [facility]    Late entry for 09/06/11. I saw and examined the patient.  I reviewed the resident's note.  I agree with  the findings and plan of care as documented in the resident's note.  Any exceptions/additions are edited/noted.    Maurilio Lovely, MD 09/07/2011, 4:09 PM

## 2011-09-06 NOTE — Nurses Notes (Signed)
Discharge Instructions given to patient and patients MPOA with verbal understanding voiced.  Patient discharged to home per MD orders.

## 2011-09-06 NOTE — Care Plan (Signed)
Problem: General Plan of Care(Adult,OB)  Goal: Plan of Care Review(Adult,OB)  The patient and/or their representative will communicate an understanding of their plan of care   Outcome: Ongoing (see interventions/notes)  Orientation assessed with each interaction.

## 2011-09-06 NOTE — Discharge Summary (Addendum)
DISCHARGE SUMMARY      PATIENT NAMEIshaan, [name]  MRN:  [id number]  DOB:      ADMISSION DATE:  09/04/2011  DISCHARGE DATE:  09/06/2011    ATTENDING PHYSICIAN: Maurilio Lovely, MD  PRIMARY CARE PHYSICIAN: Ervin Knack Anger, MD     ADMISSION DIAGNOSIS: Confusion and Memory loss  DISCHARGE DIAGNOSIS:   Active Hospital Problems    Diagnosis Date Noted   . Altered mental status 09/04/2011      Resolved Hospital Problems    Diagnosis    No resolved problems to display.     Active Non-Hospital Problems    Diagnosis Date Noted   . Elevated troponin 08/29/2011   . HTN (hypertension) 08/29/2011   . Diabetes mellitus 08/29/2011   . LBBB (left bundle branch block) 08/28/2011      DISCHARGE MEDICATIONS:  Current Discharge Medication List      START taking these medications    Details   pravastatin (PRAVACHOL) 40 mg Oral Tablet Take 1 Tab (40 mg total) by mouth QPM.  Qty: 30 Tab, Refills: 5         CONTINUE these medications which have NOT CHANGED    Details   clopidogrel (PLAVIX) 75 mg Oral Tablet Take 1 Tab (75 mg total) by mouth Once a day.  Qty: 30 Tab, Refills: 5      hydrochlorothiazide (HYDRODIURIL) 25 mg Oral Tablet Take 1 Tab (25 mg total) by mouth Once a day.  Qty: 30 Tab, Refills: 5      metoprolol (LOPRESSOR) 12.5 mg Oral Tablet Take 1 Tab (12.5 mg total) by mouth every 12 hours.  Qty: 30 Tab, Refills: 5      nitroglycerin (NITROSTAT) 0.4 mg Sublingual Tablet, Sublingual 1 Tab (0.4 mg total) by Sublingual route Every 5 minutes as needed for Chest pain. for 3 doses over 15 minutes  Qty: 30 Tab, Refills: 5      metFORMIN (GLUCOPHAGE) 850 mg Oral Tablet Take 850 mg by mouth Twice daily with food.      lisinopril (PRINIVIL) 5 mg Oral Tablet Take 5 mg by mouth Once a day.      aspirin 81 mg Oral Tablet, Chewable Take 81 mg by mouth Once a day.           DISCHARGE INSTRUCTIONS:     AMB CONSULT/REFERRAL NEUROLOGY     DISCHARGE INSTRUCTION - ACTIVITY   Walking is encouraged.      Follow up as scheduled from previous discharge.    Follow up with neurology regarding test results.   Activity: z - other (specify in comments)      SCHEDULE FOLLOW-UP PHYSICIANS OFFICE [facility]   Follow-up appointment clinic: NEUROLOGY    Follow-up in: 4 WEEKS    Reason for visit: HOSPITAL DISCHARGE    Followup reason: Confusion/Altered Mental Status Post Catheterization.    Provider: Dr. Ladell Heads      REASON FOR HOSPITALIZATION AND HOSPITAL COURSE: This is a 71 y.o., male who was recently admitted to Cardiology service for new onset LBBB and underwent cardiac cath on 2/15 and was sent home the following day stable and chest pain free. He has known CAD s/p PCI with DES x2 to LAD and DES x1 to RCA (08/29/2011), HTN, and DM type 2 who presented as a transfer from [facility] for altered mental status. His girlfriend and sister noted that he seemed confused at times but thought that this was just transient. On presentation to [facility] he was  noted to be responding slowly but coherently. Today on assessment, the patient is found to be A/O x 3 and responding appropriately. He was seen by neurology and has undergone an EEG. CT angiography/MRI/MRA (results below). Neurology decided patient's symptoms were likely due to decompensation after catheterization with old chronic ischemic changes and benign mass found on imaging. The patient's memory was much improved and he was alert and oriented on discharge.     Results for orders placed during the hospital encounter of 09/04/11 (from the past 72 hour(s))   XR AP MOBILE CHEST     Status: Normal    Narrative:     [name]     EXAMINATION:  Chest, single frontal view, on September 04, 2011, at 2046   for an indication of chest pain.     Comparison is made with a prior examination on August 28, 2011.     FINDINGS:  The heart size is within normal limits and the pulmonary   vasculature is unremarkable with no evidence of pulmonary edema.  There is    no focal consolidation or pleural effusion.  No visible pneumothorax is   present.  Small calcified granulomas of the lungs bilaterally are noted.    Degenerative changes are present in the spine.       Impression:        No radiographic evidence of acute cardiopulmonary process.            CT ANGIO INTRACRANIAL W/WO IV CONTRAST     Status: Normal (Preliminary result)    Narrative:     EXAMINATION:  CT angio, intracranial, with and without IV contrast was   obtained on September 05, 2011, at 1429 hours.  The exam was compared to   MRI brain on the same date.     INDICATION:  Altered mental status.     TECHNIQUE:  70 mls of Isovue 370 was administered for the exam.     FINDINGS:     UNENHANCED CT OF THE BRAIN:  The ventricular system is within normal   limits.  No intraparenchymal masses, hemorrhages or abnormal fluid   collections are noted.  Gray-white differentiation is well preserved.    Patchy subcortical and deep white matter lucencies are noted in the   bilateral frontal and parietal lobes, likely representing chronic   microvascular ischemic changes.  Calcification in the left CPA is noted,   which can be related to calcified meningioma or left vestibular   schwannoma.  The bilateral paranasal sinuses and mastoid air cells are   well pneumatized.  Vascular calcifications are noted and are described as   below.     CT ANGIOGRAPHY OF THE HEAD:  There is normal formation of the   vertebrobasilar system.  The ACom is widely patent.  PComs are not   visualized bilaterally.  Atherosclerotic calcification of the bilateral   supraclinoid and cavernous ICA is noted.  Moderate stenosis in bilateral   supraclinoid ICA is noted.  Moderate stenosis in bilateral supraclinoid   ICA is noted, measuring at least 50%.  No vascular malformation or   occlusion is noted in the intracranial arteries.  Limited views of dural   venous sinuses demonstrate no large filling defects.       Impression:         1.  No acute intracranial process.  2.  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  3.  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  MRA INTRACRANIAL                     Status: Normal    Narrative:     MRA INTRACRANIAL                 performed on [facility] on Sep 05, 2011  6:21 PM.      CLINICAL HISTORY:  Altered mental status    TECHNIQUE: 3-D time-of-flight MRA images of the brain.    COMPARISON: Magnetic resonance imaging brain same date. CTA of 09/05/2011    FINDINGS: The distal cervical segments, petrous, cavernous segments of the   internal carotid arteries are patent bilaterally. There is at least   moderate narrowing noted at the supraclinoid segments of the internal   carotid arteries appearing worse on the right side, which could be   exaggerated by susceptibility from atherosclerotic calcifications. The   middle and anterior cerebral arteries are patent. The vertebral arteries   and basilar arteries and posterior cerebral arteries are patent. However   there is small to moderate narrowing noted to the left posterior cerebral   artery distal to the  P1 P2 junction.        Impression:        1. Intracranial atherosclerosis with at least moderate stenosis noted at   the supraclinoid segments of the internal carotid arteries, with the   degree of stenosis could be exaggerated by the atherosclerotic vascular   wall calcifications.           MRI BRAIN W/WO CONTRAST     Status: Normal    Narrative:     MRI BRAIN W/WO CONTRAST performed on [facility] on Sep 05, 2011    7:22 PM.    CLINICAL HISTORY:  altered mental status    INTRAVENOUS CONTRAST: 28ml's of Multihance.    LABS:   Last Creatinine: 0.97 mg/dL on 8/46/9629  Last GFR:  >59  Creat POCT:    Glucose POCT: GLUCOSEPOC     96 mg/dL on 12/09/4130    TECHNIQUE: Pre-contrast sagittal T1, and axial T1-w, T2-w, FLAIR, GRE, and   diffusion-w images of the brain with ADC maps. Post contrast axial and    coronal T1 images were performed.    COMPARISON: CT angiogram of 09/05/2011    FINDINGS: Diffusion weighted images demonstrate an acute to subacute   lacunar infarct noted within the left cerebellar hemisphere. No acute   ischemia is demonstrated within the supratentorial brain.There are   scattered nonspecific foci of hyperintense T2 white matter signal changes,   which could represent chronic small vessel ischemic white matter changes.   There is no hydrocephalus. There is no acute intracranial hemorrhage.   Axial GRE images demonstrate focal areas of susceptibility noted within   the left cerebellopontine angle associated with small area of nodular   enhancement, which may represent a predominantly calcified meningioma or   vestibular schwannoma there is minimal mass effect noted on the seventh   and eighth nerve.    Postcontrast images also demonstrate enhancement of the left cerebellar   and posterior lacunar infarcts, indicating a subacute onset.        Impression:        1. A subacute left cerebellar hemispheric lacunar infarct.  2. redemonstration of the predominantly calcified minimally enhancing left   cerebellopontine angle mass, which may represents a vestibular schwannoma   or a meningioma.  MRA CAROTID-EXTRACRANIAL (NECK) W/WO CONTRAST     Status: Normal (Preliminary result)    Narrative:     MRA CAROTID-EXTRACRANIAL (NECK) W/WO CONTRAST performed on [facility] on Sep 05, 2011  7:22 PM.    CLINICAL HISTORY:  Altered Mental status    INTRAVENOUS CONTRAST: 72ml's of Multihance.    LABS:   Last Creatinine: 0.97 mg/dL on 0/86/5784  Last GFR:  >59  Creat POCT:    Glucose POCT: GLUCOSEPOC     96 mg/dL on 6/96/2952    TECHNIQUE: 2-D time-of-flight MRA images and post contrast coronal T2   images with multiplanar MIP images.    COMPARISON: CT intracranial angiogram of 09/05/2011    FINDINGS: The aortic arch demonstrates atypical branch pattern. The    origins of the brachiocephalic, left common carotid artery and left   subclavian arteries are patent. The origins of the vertebral arteries are   patent. The cervical segments of the vertebral arteries are patent. The   common carotid arteries demonstrate atherosclerotic narrowing which is   mild on the right measuring less than 10% while on the left its   mild-to-moderate measuring about 30%.    Partially imaged intracranial vessels demonstrate mild-to-moderate   narrowing noted to the supraclinoid segments, left posterior cerebral   artery is, however better assess the accompanying MR a head images..      Impression:        1. mild atherosclerotic disease changes noted to the common carotid   arterial bifurcation, however measuring less than 30% on the left.           CONDITION ON DISCHARGE:   A. Ambulation: Patient is able to ambulate independently.   B. Self-care Ability: Patient is able to perform all aspects of self care independently.   C. Cognitive Status Patient is alert and oriented to three spheres fully aware of their medical condition and treatment regimen.   DISCHARGE DISPOSITION: Home discharge     cc: Primary Care Physician:  Toni Amend, MD  [address]  [place] [address]     GM:[id number] Physician:  Gweneth Fritter, PA-C  [facility]  [address]  [address]  [place], [place] [address]     Kriste Basque, MD    Referring providers can utilize [email or web address] to access their referred [employer] patient's information.      I saw and examined the patient.  I reviewed the resident's note.  I agree with the findings and plan of care as documented in the resident's note.  Any exceptions/additions are edited/noted.    Maurilio Lovely, MD 09/06/2011, 9:06 PM

## 2011-09-06 NOTE — Care Plan (Signed)
Problem: General Plan of Care(Adult,OB)  Goal: Plan of Care Review(Adult,OB)  The patient and/or their representative will communicate an understanding of their plan of care   Outcome: Ongoing (see interventions/notes)  71 year old male with short term memory problems.  Patient had MRI and CT scan awaiting results.  Will monitor patient.[name]

## 2011-09-08 LAB — VDRL, SERUM: VDRL, SERUM: NONREACTIVE

## 2011-09-08 LAB — VITAMIN B1 (THIAMIN), WHOLE BLOOD: THIAMIN (VITAMIN B1), WB: 153

## 2011-09-25 ENCOUNTER — Encounter (INDEPENDENT_AMBULATORY_CARE_PROVIDER_SITE_OTHER): Payer: Self-pay | Admitting: Internal Medicine

## 2011-09-25 ENCOUNTER — Ambulatory Visit (INDEPENDENT_AMBULATORY_CARE_PROVIDER_SITE_OTHER): Payer: Medicare Other | Admitting: Internal Medicine

## 2011-09-25 DIAGNOSIS — I2510 Atherosclerotic heart disease of native coronary artery without angina pectoris: Secondary | ICD-10-CM

## 2011-09-25 HISTORY — DX: Atherosclerotic heart disease of native coronary artery without angina pectoris: I25.10

## 2011-09-25 MED ORDER — LABETALOL 100 MG TABLET
100.0000 mg | ORAL_TABLET | Freq: Two times a day (BID) | ORAL | Status: DC
Start: 2011-09-25 — End: 2011-10-30

## 2011-09-25 NOTE — Progress Notes (Signed)
See dictated note.

## 2011-09-26 NOTE — Progress Notes (Signed)
[facility]  [address] [place], [place] [address]    PROGRESS NOTE    PATIENT NAME: [name], [name]  CHART NUMBER: [id number]  DATE OF BIRTH:   DATE OF SERVICE: 09/25/2011    [facility]  [address]  [place], [place]  [address]    REQUESTING AND REFERRING PHYSICIAN:  Ervin Knack. Anger, MD    PRIMARY CARE PHYSICIAN:  H. Hubert Azure, MD     SUBJECTIVE:  [name] is a 71 year old gentleman who was originally seen while he was at [facility], admitted for non-ST elevation MI and new left bundle branch block.  He was subsequently transferred to [facility].  He underwent cardiac catheterization on August 29, 2011.  He had a PCI to LAD with implantation of a 3.0 x 18 mm Xpedition stent overlapped distally with a 2.75 x 9 mm Xpedition stent.  He also had PCI to the right coronary artery with implantation of a 2.5 x 15 mm Xpedition drug-eluting stent.  He had an echocardiogram, which showed mildly reduced left ventricular systolic function, ejection fraction of 40-45%.  After his procedure, subsequently, he came back to the hospital because of some short-term memory deficit.  An acute stroke was ruled out by CT scan.  He was seen by neurology service and outpatient followup was recommended.  Since then, he has been living with his sister who also indicated that he has not paid some of his bills and it turns out that he had some deficits prior to his hospitalization; however, after this hospitalization, it was more pronounced.  So he is in the office today for a followup visit.  He is accompanied by a friend.  During today's office visit, he denies any complaints of chest pain, shortness of breath, palpitation, orthopnea, paroxysmal nocturnal dyspnea or dyspnea upon exertion.  He is able to walk for up to 2 miles.  He is attending cardiac rehabilitation with no limitation and according to his friend that even his memory deficit is a lot better.  There was only a report of some occasional dizziness feeling.  His blood pressure readings were also occasionally in the lower range, below 100 systolic.  There was also a concern about him being on a statin medication for its interference with the cognitive function.  Initially, he was on pravastatin for which he developed a rash and this was discontinued and then he was started on Lipitor at 20 mg and  after family made research and recognized that statins were all associated, there are some reports about their association with the cognitive deficit, they took him off the Lipitor since September 19, 2011.  By September 23, 2011, they feel that they have noticed significant improvement, so the question that was raised in my office today was should they keep him off the Lipitor.  Also concerns about if the dizziness is related to the metoprolol and since he has tolerated labetalol in the past, should he go back on his labetalol.    CURRENT MEDICATIONS:  1.  Aspirin 81 mg tablet daily.  2.  Plavix 75 mg tablet daily.  3.  Metoprolol 12.5 mg twice daily.  4.  Lisinopril 5 mg tablet daily.  5.  Nitroglycerin 0.4 mg on as-needed basis.  6.  Multivitamin tablet.  7.  CoEnzyme Q daily.  8.  Ascorbic acid vitamin C 2000 mg daily.  9.  Metformin 850 mg twice daily.  10.  Hydrochlorothiazide 25 mg  tablet daily.    ALLERGIES:  He has allergy to PRAVASTATIN and concern about the LIPITOR for cognitive dysfunction.    OBJECTIVE:  He is awake and alert, in no apparent distress.  He is 6 feet tall, weighs 186 pounds, pulse 74 beats per minute, blood pressure 104/70, pulse ox 100% on room air.  Head:  Atraumatic, normocephalic.  Neck:  Supple, no jugular venous distention, no carotid bruit, no cervical lymph node enlargement, no goiter.  Trachea is central.  Chest:  Clear to auscultation and percussion, no rales, wheezes or crackles.  Heart:  Normal S1, normal S2, regular rate and rhythm, no gallop, rub or murmur.  Abdomen:  Soft, nontender, no organomegaly, no masses.  Extremities:  Good pulses, no evidence of cyanosis, clubbing or leg edema.  Nervous system:  He is oriented x3 with no gross motor deficit.    ASSESSMENT AND PLAN:   1.  Coronary artery disease status post PCI to LAD and RCA with drug-eluting stents, stable, no symptoms to suggest angina or CHF.  I advised him to continue his current medical therapy, which includes aspirin.  He is off the statins due to concerns about the cognitive impairment, which I agree with at this point, since his neurologic symptoms seem to be significant and according to him there is some improvement after stopping that medication, to stay off of it and we will see how he does.  Certainly, there is value in taking statins in decreasing the chance of progression of his atherosclerotic vascular disease, but with this concern, I think I agree with holding it off and understanding the risk and to continue his lisinopril and since he has tolerated labetalol in the past with no problems, we can switch him from metoprolol to his labetalol at 100 mg twice daily.  Continue lisinopril at 5 mg and discontinue hydrochlorothiazide.  2.  Mild ischemic cardiomyopathy.  He has no symptoms to suggest CHF.  He is stable.  He is on an ACE and beta-blocker.  Continue those.    3.  About his cognitive impairment, he was advised to see a neurologist.  At this point, he seems to be stable from a cardiac standpoint.  He is advised to come back for a followup visit in 6 months.      Linward Natal, MD  Assistant Professor, Section of Cardiology  [facility] Department of Medicine    [id number]; D: 09/25/2011 12:53:06; T: 09/26/2011 09:37:41    cc: Ervin Knack Anger MD      [address]       [place], [place] [address]            Nehemiah Settle MD      [address] [place] [address]       [place], [place] [address]

## 2011-10-21 ENCOUNTER — Encounter (INDEPENDENT_AMBULATORY_CARE_PROVIDER_SITE_OTHER): Payer: Self-pay | Admitting: Neurology

## 2011-10-30 ENCOUNTER — Other Ambulatory Visit (INDEPENDENT_AMBULATORY_CARE_PROVIDER_SITE_OTHER): Payer: Self-pay | Admitting: Internal Medicine

## 2011-10-30 ENCOUNTER — Ambulatory Visit (INDEPENDENT_AMBULATORY_CARE_PROVIDER_SITE_OTHER): Payer: Medicare Other | Admitting: Internal Medicine

## 2011-10-30 VITALS — BP 132/64 | HR 78 | Resp 16 | Ht 72.0 in | Wt 188.0 lb

## 2011-10-30 MED ORDER — CARVEDILOL 6.25 MG TABLET
6.2500 mg | ORAL_TABLET | Freq: Two times a day (BID) | ORAL | Status: DC
Start: 2011-10-30 — End: 2013-07-21

## 2011-10-30 MED ORDER — LISINOPRIL 10 MG TABLET
10.0000 mg | ORAL_TABLET | Freq: Every day | ORAL | Status: DC
Start: 2011-10-30 — End: 2011-12-25

## 2011-10-30 NOTE — Patient Instructions (Signed)
Stop HCTZ    Stop Normodyne    Increase Lisinopril to 10mg  a day    Start Coreg 6.25mg .  One tablet twice a day.    Stay on Aspirin and Plavix.

## 2011-11-05 NOTE — Progress Notes (Addendum)
OV dictated    Rosalita Chessman L. Manson Passey, PA-C    I have seen and evaluated the patient jointly with Herbie Saxon PA-C and agree with her assessment, recommendations and plans.      Vincent Gros, MD 11/05/2011 8:51 AM

## 2011-11-12 NOTE — Progress Notes (Addendum)
[facility]  [address] [place], [place] [address]    PROGRESS NOTE    PATIENT NAME: [name], [name]  CHART NUMBER: [id number]  DATE OF BIRTH:   DATE OF SERVICE: 10/30/2011    [facility]  [address]  [place], [place]  [address]    SUBJECTIVE:  A 71 year old, white male who is followed in the clinic with a history of coronary artery disease, undergoing stenting to his LAD and RCA with drug-eluting stents on August 29, 2011.  At that time, he had mildly reduced LV systolic function with an EF of 40-45%.  He has also been evaluated through neurology for short-term memory deficit following his procedure and an acute stroke was ruled out by CT scan.  He also has a history of left bundle branch block, hypertension as well as diabetes mellitus, noninsulin dependent.  Most recently, while in cardiac rehab, he developed tachycardia along with palpitations, went to the emergency department and was found with new onset of atrial fibrillation with RVR.  He received Lopressor and digoxin in the emergency department and converted to sinus rhythm, which he has maintained.  Because of his altered mental status and reliability, it was determined that he would not be a safe Coumadin candidate, both he and his sister agree.  Today in the office, he remains in sinus rhythm, no recent chest pain, shortness of breath or dyspnea upon exertion.  He has had no palpitations, no syncope or near syncopal event.    ALLERGIES:  LIPITOR and PRAVASTATIN.    MEDICATIONS:  1.  Plavix 75 mg p.o. daily.  2.  Glucophage 850 mg p.o. daily.  3.  Aspirin 81 mg p.o. daily.    4.  Previously on Normodyne, which will be discontinued.    5.  Lisinopril 5 mg p.o. daily.  6.  Multivitamin 1 p.o. daily.  7.  CoQ10 one p.o. daily.  8.  Vitamin C 2000 mg a day.    EKGs confirm sinus rhythm with left bundle branch block.    ASSESSMENT:   1.  Transient atrial fibrillation with RVR.  Today, he maintains normal sinus rhythm with left bundle branch block.  2.  History of recent non-ST elevated MI with drug-eluting stent x2 to the LAD and 1 to the RCA.  3.  Mildly reduced left ventricular systolic function with an EF of 45%.  4.  Diabetes mellitus, noninsulin dependent.  5.  Altered mental status.    PLAN:  1.  Discontinue Normodyne.  2.  Add Coreg 6.25 mg p.o. b.i.d.  3.  Increase lisinopril to 10 mg p.o. daily.  4.  Discontinue hydrochlorothiazide.  5.  Continued aspirin and Plavix.  6.  Because of question of reliability, he is not a suitable Coumadin candidate and both he and his sister agree.  7.  We will see him back in the clinic in 6 months, sooner if needed.      Gweneth Fritter, PA-C  Section of Cardiology  [place] [facility] Medicine    Linward Natal, MD  Assistant Professor, Section of Cardiology  [facility] Department of Medicine    [id number]; D: 11/12/2011 14:34:42; T: 11/12/2011 19:20:27    cc: Ervin Knack Anger MD      [address]       [place], [place] [address]    I have seen and evaluated the patient jointly with Herbie Saxon PA-C and agree with her assessment, recommendations and plans.  Vincent Gros, MD 11/22/2011 7:16 PM

## 2011-11-24 ENCOUNTER — Ambulatory Visit (INDEPENDENT_AMBULATORY_CARE_PROVIDER_SITE_OTHER): Payer: Medicare Other | Admitting: Internal Medicine

## 2011-11-24 VITALS — BP 148/82 | HR 60 | Resp 18 | Ht 72.0 in | Wt 194.0 lb

## 2011-11-24 NOTE — Progress Notes (Signed)
[facility]  [address] [place], [place] [address]    PROGRESS NOTE    PATIENT NAME: [name], [name]  CHART NUMBER: [id number]  DATE OF BIRTH:   DATE OF SERVICE: 11/24/2011    [facility]  [address]  [place], [place]  [address]     REFERRING AND REQUESTING PHYSICIAN:  H. Hubert Azure, MD.    SUBJECTIVE:  [name] is a 71 year old gentleman who underwent PCI to his LAD and RCA with drug-eluting stents on August 29, 2011.  He had a reduced LV systolic function with ejection fraction of 40-45%.  He also has short-term memory deficits.  An acute stroke was ruled out by CT.  He has other medical problems including diabetes, hypertension, hyperlipidemia.  He was attending his cardiac rehabilitation on Nov 21, 2011, when, by the end of his session, his blood pressure dropped from 138/78 to 98/62 and he was feeling dizzy without chest pain, shortness of breath, palpitations, orthopnea, syncope or near syncope.  His EKG showed a left bundle branch block.  Since then, he has felt all right.  He did not have any recurrent spells of dizziness over the weekend, he was feeling okay.  He is in the office today for a followup visit.      During today's office visit, he denies any complaints.  He takes his medications.  He is accompanied by his sister who obviously was concerned about drop in the blood pressure while he was at cardiac rehab.  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    MEDICATIONS:  He takes his medications, which include:  1.  Plavix 75 mg daily.  2.  Aspirin 81 mg daily.  3.  Coreg 6.25 mg twice a day.  4.  Lisinopril 10 mg once a day.  5.  Coenzyme Q.  6.  Multivitamin.  7.  Metformin 850 mg twice a day.  8.  Nitroglycerin on as needed basis.     ALLERGIES:  He has no allergies, but he is intolerant to LIPITOR and ALL THE STATINS due to concern about cognitive dysfunction.    OBJECTIVE:  He is awake and alert.  He is 6 feet tall, weighs 194 pounds, pulse 60 beats per minute and regular, blood pressure 148/82, pulse ox 99%.  Head:  Atraumatic, normocephalic.  Neck:  Supple with no jugular venous distention, no carotid bruit, no cervical lymph node enlargement, no goiter.  Trachea is central.  Chest:  Clear to auscultation and percussion, no rales, wheezes or crackles.  Heart:  Normal S1, normal S2, regular rate and rhythm, no gallop, rub or murmur.  Abdomen:  Soft, nontender, no organomegaly, no masses.  Extremities:  Equal pulses, no evidence of cyanosis, finger clubbing or leg edema.  Nervous System:  He is oriented x3, no focal deficit that I can see.    IMPRESSION AND PLAN:  1.  Coronary artery disease status post PCI to LAD and RCA, stable.  No symptoms to suggest angina.  He is on good medical therapy including aspirin, Plavix, beta blockers, ACE inhibitor.  He is not on a statin due to concern about cognitive function.  He has short-term memory deficit.  2.  In reference to the recent hypotension at the end of his exercise, I did orthostatics on him today.  His blood pressure was  lying 152/92, sitting 142/90 and standing 140/70 and heart rate lying was 64, sitting 59 and standing 63.  There are minor changes in his reading and he was not symptomatic, so I am not sure if he has some autonomic dysfunction related to his diabetes.  I will refer him back to cardiac rehab and will recheck the blood pressure again at the end of his exercise.  According to him, he had not eaten anything before he went to his rehab, that is why he was not feeling well.  3.  Hypertension.  Blood pressure seems to be borderline controlled.  I will not adjust his medication at this point due to concern about low blood pressure spells.   4.  Hyperlipidemia/dyslipidemia.  He does not want to pursue any statins at this point.  I advised a low fat, low cholesterol diet.  5.  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 Hubert Azure, as well as with neurology as his short-term memory deficit might be a problem for that.  6.  I will see him back in a followup visit in 3 months or earlier if he has any problems.      Linward Natal, MD  Assistant Professor, Section of Cardiology  [facility] Department of Medicine    [id number]; D: 11/24/2011 12:13:00; T: 11/24/2011 13:24:28    cc: Nehemiah Settle MD      [address]       [place], [place] [address]

## 2011-12-03 NOTE — Progress Notes (Signed)
See dictated note.

## 2011-12-11 ENCOUNTER — Encounter (INDEPENDENT_AMBULATORY_CARE_PROVIDER_SITE_OTHER): Payer: Self-pay | Admitting: Internal Medicine

## 2011-12-16 ENCOUNTER — Encounter (INDEPENDENT_AMBULATORY_CARE_PROVIDER_SITE_OTHER): Payer: Medicare Other | Admitting: Neurology

## 2011-12-23 ENCOUNTER — Encounter (INDEPENDENT_AMBULATORY_CARE_PROVIDER_SITE_OTHER): Payer: Medicare Other | Admitting: Neurology

## 2011-12-25 ENCOUNTER — Ambulatory Visit (INDEPENDENT_AMBULATORY_CARE_PROVIDER_SITE_OTHER): Payer: Medicare Other | Admitting: Internal Medicine

## 2011-12-25 VITALS — BP 140/82 | HR 82 | Resp 16 | Ht 72.0 in | Wt 189.0 lb

## 2011-12-25 MED ORDER — AMLODIPINE 5 MG TABLET
5.0000 mg | ORAL_TABLET | Freq: Every day | ORAL | Status: DC
Start: 2011-12-25 — End: 2013-07-21

## 2011-12-25 NOTE — Progress Notes (Signed)
See dictated note.

## 2011-12-25 NOTE — Progress Notes (Signed)
[facility]  [address] [place], [place] [address]    PROGRESS NOTE    PATIENT NAME: [name], [name]  CHART NUMBER: [id number]  DATE OF BIRTH:   DATE OF SERVICE: 12/25/2011    [facility]  [address]  [place], [place]  [address]    REQUESTING AND REFERRING PHYSICIAN:  Hubert Azure, MD    SUBJECTIVE:  [name] is a 71 year old gentleman with history of PCI to his LAD and RCA with drug-eluting stents on August 29, 2011.  He had mildly reduced LV systolic function, EF of 40-45%.  He also has short-term memory deficit.  An acute stroke was ruled out by CT scan.  Other medical problems include hypertension, diabetes, hyperlipidemia.  Recently, he was taken off Lipitor because of concerns about his forgetfulness and both he and his sister report improvement in his memory after stopping the Lipitor.  He has attended cardiac rehab and he has made a significant improvement.  He was able to exercise up to 30 minutes, up from 6 minutes, and he was able to walk for 2 miles after that with his sister walking his dog.  Denies any complaints of chest pain, shortness of breath, palpitation, orthopnea, paroxysmal nocturnal dyspnea or dyspnea upon exertion.  Denies any syncope or near syncope.  He has noted that early in the morning his blood pressure is elevated in the 160s.    MEDICATIONS:  Current list of medications includes:  1.  Aspirin 81 mg daily.  2.  Plavix 75 mg daily.  3.  Coreg 6.5 mg twice a day.  4.  Lisinopril 10 mg daily.  5.  Coenzyme Q.   6.  B12.    7.  Multivitamin.    8.  Metformin.  9.  Nitroglycerin.     OBJECTIVE:  He is awake and alert, in no apparent distress.  He is 6 feet tall, weighs 189 pounds, pulse 82 beats per minute, blood pressure 140/82, pulse ox 98%.  Head:  Atraumatic, normocephalic.  Neck:  Supple, no jugular venous distention, no carotid bruit, no cervical lymph node enlargement, no goiter.  Trachea is central.  Chest:  Clear to auscultation and percussion.  No rales, wheezes or crackles.  Heart:  Normal S1, normal S2, regular rate and rhythm, no gallop, rub or murmur.  Abdomen:  Soft, nontender, no organomegaly, no masses.  Extremities:  Equal pulses.  No cyanosis, finger clubbing or leg edema.  Nervous System:  He is oriented x3.  He is forgetful but I was not able to elicit any deficit during my exam today.    ASSESSMENT AND PLAN:  1.  Coronary artery disease status post drug-eluting stents to LAD and RCA, stable, no symptoms to suggest angina or CHF.  2.  Mild ischemic cardiomyopathy, stable.  3.  Hypertension, uncontrolled.  We will add Norvasc 5 mg at nighttime.  Continue Coreg and lisinopril.    PLAN:  At this point, he seems to be stable from a cardiac standpoint.  He is advised to come back for a followup visit in 6 months or earlier if he has any problems.      Linward Natal, MD  Assistant Professor, Section of Cardiology  [facility] Department of Medicine    [id number]; D: 12/25/2011 27:25:36; T: 12/25/2011 12:37:19    cc: Nehemiah Settle MD      [address]       [place], [place] [address]

## 2012-03-08 ENCOUNTER — Encounter (INDEPENDENT_AMBULATORY_CARE_PROVIDER_SITE_OTHER): Payer: Self-pay | Admitting: Internal Medicine

## 2012-03-30 ENCOUNTER — Encounter (INDEPENDENT_AMBULATORY_CARE_PROVIDER_SITE_OTHER): Payer: Self-pay | Admitting: Internal Medicine

## 2012-06-28 ENCOUNTER — Other Ambulatory Visit (INDEPENDENT_AMBULATORY_CARE_PROVIDER_SITE_OTHER): Payer: Self-pay

## 2012-06-29 ENCOUNTER — Ambulatory Visit (INDEPENDENT_AMBULATORY_CARE_PROVIDER_SITE_OTHER): Payer: Medicare Other | Admitting: Internal Medicine

## 2012-06-29 ENCOUNTER — Encounter (INDEPENDENT_AMBULATORY_CARE_PROVIDER_SITE_OTHER): Payer: Self-pay | Admitting: Internal Medicine

## 2012-06-29 VITALS — BP 138/72 | HR 63 | Resp 16 | Ht 72.0 in | Wt 196.0 lb

## 2012-06-29 NOTE — Progress Notes (Signed)
 [facility]  [address] [place], [place] [address]    PROGRESS NOTE    PATIENT NAME: [name], [name]  CHART NUMBER: [id number]  DATE OF BIRTH: 1941-03-26  DATE OF SERVICE: 06/29/2012    [facility]  [address]  [place], [place]  [address]    REQUESTING AND REFERRING PHYSICIAN:  H. Derrek Pal, MD    Dear Derrek:      SUBJECTIVE:  [name] was in our office today for a followup visit.  As you may remember, he is a 71 year old gentleman who had PCI to LAD and RCA August 29, 2011, with drug-eluting stents.  He had a mildly reduced left ventricular systolic function, EF of 40-45%.  He also had short-term memory deficit.  An acute stroke was ruled out by CT scans.  His other medical problems include hypertension, diabetes, hyperlipidemia.  He is in the office accompanied by his sister.      During today's office visit, he denies any complaints of chest pain, shortness of breath, palpitation, orthopnea, paroxysmal nocturnal dyspnea or dyspnea upon exertion.  Denies any syncope or near syncope.  However, his sister is concerned about the winter months coming and he is getting physically less active.  He also had an incident of falling on ice with hitting on his back with a bump on his head; however, there was no syncope with that.  His blood pressure medication has been adjusted by your office due to elevated systolic blood pressure with improvement.    REVIEW OF SYSTEMS:  CNS:  No headache.  No visual changes, no weakness or numbness.  He seems to have significant improvement in his mental function with a lot better memory and he is a lot more interactive.  Ears, nose and throat:  No sore throat, no earache.  Respiratory:  No cough, fever or chills.  Gastrointestinal:  No nausea, vomiting, diarrhea.  No blood in the stools.  Genitourinary:  No dysuria, burning or frequency, no blood in the urine.  Musculoskeletal:  Minimal aches and pains of his joints.    MEDICATIONS:   Current list includes:  1.  Aspirin  81 mg daily.  2.  Plavix  75 mg daily.  3.  He is off Lipitor due to concern about cognitive dysfunction.  4.  Coreg  6.5 mg twice daily.  5.  Losartan  50 mg twice a day.  6.  Amlodipine  5 mg once a day.  7.  Hydrochlorothiazide  12.5 mg once a day.  8.  Metformin 850 twice a day.  9.  Nitroglycerin  on as needed basis.  10.  Potassium.  11.  Iron .  12.  Coenzyme Q10.  13.  Vitamin C.    ALLERGIES: He does not have drug allergies;  however, he had concern about cognitive dysfunction while on Lipitor.  This medication has been discontinued.    OBJECTIVE:  He is awake and alert, in no apparent distress.  He is 6 feet tall.  Weighs 196 pounds that is 7 pounds more than his weight in the summer.  Pulse 63 beats per minute and regular.  Blood pressure 138/72.  Pulse ox 100% on room air.  Head:  Atraumatic.  Neck:  Supple, no carotid bruit, no cervical lymph node enlargement, no goiter.  Trachea is central.  Chest:  Clear to auscultation, no rales, wheezes, or crackles.  Heart:  Normal S1, S2, regular rate and rhythm, no gallop, rub or murmur.  Abdomen:  Soft, nontender,  no organomegaly, no masses.  Extremities:  Equal pulses, no evidence of cyanosis, finger clubbing or leg edema.  Nervous system:  He is oriented x3 with no gross motor deficit.    ASSESSMENT AND PLAN:  1.  Coronary artery disease status post PCI with drug-eluting stents, stable, no symptoms to suggest angina or CHF.  2.  Mild ischemic cardiomyopathy with good functional capacity, I will refer him for stage III cardiac rehabilitation.  He is already on beta-blocker and ARB.  He is advised to continue.  3.  Hypertension.  Blood pressure seems to be a lot better controlled.  4.  Dyslipidemia.  He is off statin medication.  I would recommend followup on his lipids with Dr. Derrek Valentine's office.    At this point, he seems to be stable from a cardiac standpoint.  He is off statins because of the concern about his mental  function and will follow with my office in 6 months.      Carry Bernie Netters, MD  Assistant Professor, Section of Cardiology  [facility] Department of Medicine    [id number]; D: 06/29/2012 11:41:50; T: 06/29/2012 13:51:06    cc: VEAR Derrek Pal MD      [address]       [place], [place] [address]

## 2012-06-29 NOTE — Progress Notes (Signed)
See dictated note.

## 2012-12-27 ENCOUNTER — Encounter (INDEPENDENT_AMBULATORY_CARE_PROVIDER_SITE_OTHER): Payer: Self-pay | Admitting: Internal Medicine

## 2013-07-08 ENCOUNTER — Inpatient Hospital Stay (HOSPITAL_COMMUNITY)
Admission: EM | Admit: 2013-07-08 | Discharge: 2013-07-13 | DRG: 291 | Disposition: A | Discharge status: Home / Self Care | Payer: Medicare Other | Attending: Internal Medicine | Admitting: Internal Medicine

## 2013-07-08 ENCOUNTER — Emergency Department (HOSPITAL_COMMUNITY): Payer: Medicare Other

## 2013-07-08 ENCOUNTER — Encounter (HOSPITAL_COMMUNITY): Payer: Self-pay | Admitting: Emergency Medicine

## 2013-07-08 DIAGNOSIS — I482 Chronic atrial fibrillation, unspecified: Secondary | ICD-10-CM | POA: Diagnosis present

## 2013-07-08 DIAGNOSIS — E1149 Type 2 diabetes mellitus with other diabetic neurological complication: Secondary | ICD-10-CM | POA: Diagnosis present

## 2013-07-08 DIAGNOSIS — E1142 Type 2 diabetes mellitus with diabetic polyneuropathy: Secondary | ICD-10-CM | POA: Diagnosis present

## 2013-07-08 DIAGNOSIS — J449 Chronic obstructive pulmonary disease, unspecified: Secondary | ICD-10-CM

## 2013-07-08 DIAGNOSIS — Z7982 Long term (current) use of aspirin: Secondary | ICD-10-CM

## 2013-07-08 DIAGNOSIS — T502X5A Adverse effect of carbonic-anhydrase inhibitors, benzothiadiazides and other diuretics, initial encounter: Secondary | ICD-10-CM | POA: Diagnosis present

## 2013-07-08 DIAGNOSIS — Z9861 Coronary angioplasty status: Secondary | ICD-10-CM

## 2013-07-08 DIAGNOSIS — N179 Acute kidney failure, unspecified: Secondary | ICD-10-CM | POA: Diagnosis present

## 2013-07-08 DIAGNOSIS — J4489 Other specified chronic obstructive pulmonary disease: Secondary | ICD-10-CM | POA: Diagnosis present

## 2013-07-08 DIAGNOSIS — E119 Type 2 diabetes mellitus without complications: Secondary | ICD-10-CM | POA: Diagnosis present

## 2013-07-08 DIAGNOSIS — I255 Ischemic cardiomyopathy: Secondary | ICD-10-CM

## 2013-07-08 DIAGNOSIS — J189 Pneumonia, unspecified organism: Secondary | ICD-10-CM | POA: Diagnosis present

## 2013-07-08 DIAGNOSIS — I10 Essential (primary) hypertension: Secondary | ICD-10-CM | POA: Diagnosis present

## 2013-07-08 DIAGNOSIS — I4891 Unspecified atrial fibrillation: Secondary | ICD-10-CM | POA: Diagnosis present

## 2013-07-08 DIAGNOSIS — R413 Other amnesia: Secondary | ICD-10-CM | POA: Diagnosis present

## 2013-07-08 DIAGNOSIS — I5021 Acute systolic (congestive) heart failure: Secondary | ICD-10-CM

## 2013-07-08 DIAGNOSIS — I509 Heart failure, unspecified: Secondary | ICD-10-CM | POA: Diagnosis present

## 2013-07-08 DIAGNOSIS — J9600 Acute respiratory failure, unspecified whether with hypoxia or hypercapnia: Secondary | ICD-10-CM | POA: Diagnosis present

## 2013-07-08 DIAGNOSIS — I252 Old myocardial infarction: Secondary | ICD-10-CM

## 2013-07-08 DIAGNOSIS — I2510 Atherosclerotic heart disease of native coronary artery without angina pectoris: Secondary | ICD-10-CM | POA: Diagnosis present

## 2013-07-08 DIAGNOSIS — I5023 Acute on chronic systolic (congestive) heart failure: Principal | ICD-10-CM | POA: Diagnosis present

## 2013-07-08 DIAGNOSIS — J9601 Acute respiratory failure with hypoxia: Secondary | ICD-10-CM

## 2013-07-08 DIAGNOSIS — I2589 Other forms of chronic ischemic heart disease: Secondary | ICD-10-CM | POA: Diagnosis present

## 2013-07-08 DIAGNOSIS — R079 Chest pain, unspecified: Secondary | ICD-10-CM | POA: Diagnosis present

## 2013-07-08 HISTORY — DX: Essential (primary) hypertension: I10

## 2013-07-08 HISTORY — DX: Type 2 diabetes mellitus without complications: E11.9

## 2013-07-08 HISTORY — DX: Unspecified atrial fibrillation: I48.91

## 2013-07-08 HISTORY — DX: Acute myocardial infarction, unspecified: I21.9

## 2013-07-08 HISTORY — DX: Type 2 diabetes mellitus with diabetic neuropathy, unspecified: E11.40

## 2013-07-08 LAB — PROTIME-INR
INR: 1.1 (ref 0.00–1.49)
Prothrombin Time: 14 seconds (ref 11.6–15.2)

## 2013-07-08 LAB — PRO B NATRIURETIC PEPTIDE: Pro B Natriuretic peptide (BNP): 2939 pg/mL — ABNORMAL HIGH (ref 0–125)

## 2013-07-08 LAB — BASIC METABOLIC PANEL
BUN: 42 mg/dL — ABNORMAL HIGH (ref 6–23)
Chloride: 101 mEq/L (ref 96–112)
Creatinine, Ser: 1.43 mg/dL — ABNORMAL HIGH (ref 0.50–1.35)
GFR calc Af Amer: 55 mL/min — ABNORMAL LOW (ref >90)
Glucose, Bld: 221 mg/dL — ABNORMAL HIGH (ref 70–99)
Potassium: 4.1 mEq/L (ref 3.5–5.1)

## 2013-07-08 LAB — TROPONIN I
Troponin I: <0.3 ng/mL (ref <0.30)
Troponin I: <0.3 ng/mL (ref <0.30)

## 2013-07-08 LAB — POCT I-STAT TROPONIN I: Troponin i, poc: 0.21 ng/mL (ref 0.00–0.08)

## 2013-07-08 LAB — URINALYSIS, ROUTINE W REFLEX MICROSCOPIC
Ketones, ur: NEGATIVE mg/dL
Leukocytes, UA: NEGATIVE
Nitrite: NEGATIVE
Protein, ur: NEGATIVE mg/dL
Urobilinogen, UA: 0.2 mg/dL (ref 0.0–1.0)
pH: 5.5 (ref 5.0–8.0)

## 2013-07-08 LAB — HEPATIC FUNCTION PANEL
Bilirubin, Direct: 0.1 mg/dL (ref 0.0–0.3)
Indirect Bilirubin: 0.5 mg/dL (ref 0.3–0.9)
Total Bilirubin: 0.6 mg/dL (ref 0.3–1.2)

## 2013-07-08 LAB — CBC
HCT: 37.7 % — ABNORMAL LOW (ref 39.0–52.0)
Hemoglobin: 13.2 g/dL (ref 13.0–17.0)
MCV: 87.9 fL (ref 78.0–100.0)
Platelets: 207 10*3/uL (ref 150–400)
RBC: 4.29 MIL/uL (ref 4.22–5.81)
RDW: 13.2 % (ref 11.5–15.5)
WBC: 9.2 10*3/uL (ref 4.0–10.5)

## 2013-07-08 LAB — APTT: aPTT: 28 seconds (ref 24–37)

## 2013-07-08 MED ORDER — HEPARIN SODIUM (PORCINE) 5000 UNIT/ML IJ SOLN
5000.0000 [IU] | Freq: Three times a day (TID) | INTRAMUSCULAR | Status: DC
  Administered 2013-07-08 – 2013-07-09 (×2): 5000 [IU] via SUBCUTANEOUS
  Filled 2013-07-08 (×5): qty 1

## 2013-07-08 MED ORDER — DILTIAZEM HCL 25 MG/5ML IV SOLN
5.0000 mg | Freq: Once | INTRAVENOUS | Status: AC
  Administered 2013-07-08: 5 mg via INTRAVENOUS
  Filled 2013-07-08: qty 5

## 2013-07-08 MED ORDER — SODIUM CHLORIDE 0.9 % IV SOLN
INTRAVENOUS | Status: DC
  Administered 2013-07-08 – 2013-07-09 (×2): via INTRAVENOUS

## 2013-07-08 MED ORDER — SODIUM CHLORIDE 0.9 % IV SOLN: 1000.0000 mL | INTRAVENOUS | Status: DC

## 2013-07-08 MED ORDER — DILTIAZEM HCL 100 MG IV SOLR
5.0000 mg/h | INTRAVENOUS | Status: DC
  Administered 2013-07-08 – 2013-07-10 (×5): 15 mg/h via INTRAVENOUS
  Administered 2013-07-11: 5 mg/h via INTRAVENOUS
  Filled 2013-07-08 (×2): qty 100

## 2013-07-08 MED ORDER — DEXTROSE 5 % IV SOLN
1.0000 g | Freq: Once | INTRAVENOUS | Status: AC
  Administered 2013-07-08: 1 g via INTRAVENOUS
  Filled 2013-07-08: qty 10

## 2013-07-08 MED ORDER — DEXTROSE 5 % IV SOLN
1.0000 g | INTRAVENOUS | Status: DC
  Administered 2013-07-09: 1 g via INTRAVENOUS
  Filled 2013-07-08: qty 10

## 2013-07-08 MED ORDER — SODIUM CHLORIDE 0.9 % IV SOLN
1000.0000 mL | Freq: Once | INTRAVENOUS | Status: AC
  Administered 2013-07-08: 1000 mL via INTRAVENOUS

## 2013-07-08 MED ORDER — HEPARIN (PORCINE) IN NACL 100-0.45 UNIT/ML-% IJ SOLN: 12.0000 [IU]/kg/h | INTRAMUSCULAR | Status: DC

## 2013-07-08 MED ORDER — INSULIN ASPART 100 UNIT/ML ~~LOC~~ SOLN
0.0000 [IU] | Freq: Three times a day (TID) | SUBCUTANEOUS | Status: DC
  Administered 2013-07-09: 3 [IU] via SUBCUTANEOUS
  Administered 2013-07-09: 13:00:00 via SUBCUTANEOUS
  Administered 2013-07-09: 3 [IU] via SUBCUTANEOUS
  Administered 2013-07-10: 15 [IU] via SUBCUTANEOUS

## 2013-07-08 MED ORDER — DEXTROSE 5 % IV SOLN
500.0000 mg | INTRAVENOUS | Status: DC
  Administered 2013-07-09: 500 mg via INTRAVENOUS
  Filled 2013-07-08: qty 500

## 2013-07-08 MED ORDER — DEXTROSE 5 % IV SOLN
500.0000 mg | Freq: Once | INTRAVENOUS | Status: AC
  Administered 2013-07-08: 500 mg via INTRAVENOUS

## 2013-07-08 MED ORDER — HEPARIN SODIUM (PORCINE) 5000 UNIT/ML IJ SOLN: 60.0000 [IU]/kg | Freq: Once | INTRAMUSCULAR | Status: DC

## 2013-07-08 MED ORDER — CARVEDILOL 6.25 MG PO TABS
6.2500 mg | ORAL_TABLET | Freq: Two times a day (BID) | ORAL | Status: DC
Start: 2013-07-08 — End: 2013-07-08
  Filled 2013-07-08 (×2): qty 1

## 2013-07-08 NOTE — ED Notes (Addendum)
Pt reports shortness of breath that began today. Pt has a history of MI and has three stents in place in February of 2013. Pt has a history of A-fib and is currently in A-fib in triage. Family reports the last occurrence of A-fib was in May of 2013. Family states the last time this happened, the patient was taken to the ED and was started on coumadin and digoxin, however his cardiologist discontinued the medication due to the patient living alone and having impaired memory. Family also reports the patient has had a hard time controlling his blood sugar, family states his fasting blood sugar is normally around 120, however this week it has been greater than 200, CBG on arrival is 219. Pt also reports left ear ache.

## 2013-07-08 NOTE — Progress Notes (Signed)
Patient reports he will be staying with his sister Darius Saunders here in [place] until he is stable enough to go home to [place].

## 2013-07-08 NOTE — H&P (Signed)
[facility] History and Physical  [name] ZOX:[id number] DOB: 1940/11/29 DOA: 07/08/2013  Referring physician: EDP PCP: No primary provider on file.   Chief Complaint: SOB, chest pain   HPI: Darius Saunders is a 72 y.o. male who presents to the ED with SOB and chest pain that onset when he woke up this morning.  Chest pain was located in the center of his chest.  H/O MI with stent in 2013.  Patient reports he forgot to take his A.Fib rate control meds at home yesterday.  Review of Systems: Systems reviewed.  As above, otherwise negative  Past Medical History  Diagnosis Date  . MI (myocardial infarction)   . Diabetes mellitus without complication   . Hypertension   . Atrial fibrillation   . Diabetic neuropathy    Past Surgical History  Procedure Laterality Date  . Appendectomy    . Tonsillectomy     Social History:  reports that he has never smoked. He has never used smokeless tobacco. He reports that he drinks alcohol. He reports that he does not use illicit drugs.  Allergies  Allergen Reactions  . Statins Other (See Comments)    Rash, memory loss    No family history on file.   Prior to Admission medications   Medication Sig Start Date End Date Taking? Authorizing Provider  amLODipine (NORVASC) 5 MG tablet Take 5 mg by mouth every evening.   Yes Historical Provider, MD  aspirin EC 81 MG tablet Take 81 mg by mouth daily.   Yes Historical Provider, MD  carvedilol (COREG) 6.25 MG tablet Take 6.25 mg by mouth 2 (two) times daily with a meal.   Yes Historical Provider, MD  cholecalciferol (VITAMIN D) 1000 UNITS tablet Take 1,000 Units by mouth daily.   Yes Historical Provider, MD  clopidogrel (PLAVIX) 75 MG tablet Take 75 mg by mouth daily with breakfast.   Yes Historical Provider, MD  co-enzyme Q-10 30 MG capsule Take 30 mg by mouth every evening.   Yes Historical Provider, MD  Cyanocobalamin (VITAMIN B-12 IJ) Inject 1,000 mg as directed every 30 (thirty)  days.    Yes Historical Provider, MD  folic acid (FOLVITE) 1 MG tablet Take 1 mg by mouth daily.   Yes Historical Provider, MD  hydrochlorothiazide (MICROZIDE) 12.5 MG capsule Take 12.5 mg by mouth daily.   Yes Historical Provider, MD  losartan (COZAAR) 50 MG tablet Take 50 mg by mouth 2 (two) times daily.   Yes Historical Provider, MD  metFORMIN (GLUCOPHAGE) 500 MG tablet Take 1,000 mg by mouth 2 (two) times daily with a meal.   Yes Historical Provider, MD  Multiple Vitamin (MULTIVITAMIN WITH MINERALS) TABS tablet Take 1 tablet by mouth 2 (two) times daily.    Yes Historical Provider, MD  omega-3 acid ethyl esters (LOVAZA) 1 G capsule Take 1 g by mouth daily.   Yes Historical Provider, MD  vitamin C (ASCORBIC ACID) 500 MG tablet Take 500 mg by mouth 2 (two) times daily.   Yes Historical Provider, MD   Physical Exam: Filed Vitals:   07/08/13 1856  BP: 137/73  Pulse:   Temp:   Resp: 24    BP 137/73  Pulse 100  Temp(Src) 97.9 F (36.6 C) (Oral)  Resp 24  Ht 6' (1.829 m)  Wt 90.719 kg (200 lb)  BMI 27.12 kg/m2  SpO2 96%  General Appearance:    Alert, oriented, no distress, appears stated age  Head:    Normocephalic, atraumatic  Eyes:  PERRL, EOMI, sclera non-icteric        Nose:   Nares without drainage or epistaxis. Mucosa, turbinates normal  Throat:   Moist mucous membranes. Oropharynx without erythema or exudate.  Neck:   Supple. No carotid bruits.  No thyromegaly.  No lymphadenopathy.   Back:     No CVA tenderness, no spinal tenderness  Lungs:     Clear to auscultation bilaterally, without wheezes, rhonchi or rales  Chest wall:    No tenderness to palpitation  Heart:    Regular rate and rhythm without murmurs, gallops, rubs  Abdomen:     Soft, non-tender, nondistended, normal bowel sounds, no organomegaly  Genitalia:    deferred  Rectal:    deferred  Extremities:   No clubbing, cyanosis or edema.  Pulses:   2+ and symmetric all extremities  Skin:   Skin color, texture,  turgor normal, no rashes or lesions  Lymph nodes:   Cervical, supraclavicular, and axillary nodes normal  Neurologic:   CNII-XII intact. Normal strength, sensation and reflexes      throughout    Labs on Admission:  Basic Metabolic Panel:  Recent Labs Lab 07/08/13 1610  NA 135  K 4.1  CL 101  CO2 18*  GLUCOSE 221*  BUN 42*  CREATININE 1.43*  CALCIUM 9.4   Liver Function Tests:  Recent Labs Lab 07/08/13 1648  AST 23  ALT 22  ALKPHOS 91  BILITOT 0.6  PROT 6.5  ALBUMIN 3.4*   No results found for this basename: LIPASE, AMYLASE,  in the last 168 hours No results found for this basename: AMMONIA,  in the last 168 hours CBC:  Recent Labs Lab 07/08/13 1610  WBC 9.2  HGB 13.2  HCT 37.7*  MCV 87.9  PLT 207   Cardiac Enzymes:  Recent Labs Lab 07/08/13 1626  TROPONINI <0.30    BNP (last 3 results)  Recent Labs  07/08/13 1552  PROBNP 2939.0*   CBG:  Recent Labs Lab 07/08/13 1553  GLUCAP 219*    Radiological Exams on Admission: Dg Chest 2 View  07/08/2013   CLINICAL DATA:  Short of breath.  Chest pain.  EXAM: CHEST  2 VIEW  COMPARISON:  None.  FINDINGS: There is opacity at the left lung base partly silhouetting the left hemidiaphragm. This is consistent with pneumonia, or other infiltrate, likely with a minimal pleural effusion.  The lungs are otherwise clear. No right pleural effusion. No pneumothorax.  Cardiac silhouette is normal in size. The mediastinum and hilar contours are unremarkable.  IMPRESSION: Left lower lobe infiltrate.   Electronically Signed   By: Amie Portland M.D.   On: 07/08/2013 16:41    EKG: Independently reviewed.  Assessment/Plan Principal Problem:   CAP (community acquired pneumonia) Active Problems:   Atrial fibrillation with RVR   DM2 (diabetes mellitus, type 2)   Atrial fibrillation   1. CAP - CXR suspicious for cap, putting patient on CAP pathway with ABx. 2. A.Fib RVR- patients chronic A.Fib in RVR secondary to  either cap or forgetting to take his meds, rate control with cardizem gtt, check serial trops. 3. DM2 - putting patient on SSI AC/HS, holding metformin for now.    Code Status: Full  Family Communication: Family at bedside Disposition Plan: Admit to inpatient   Time spent: 70 min  Daelin Haste M. [facility] Pager [phone]  If 7AM-7PM, please contact the day team taking care of the patient [email or web address] Password [email or web address] 07/08/2013, 7:57 PM

## 2013-07-08 NOTE — ED Provider Notes (Signed)
CSN: [id number]     Arrival date & time 07/08/13  1526 History   First MD Initiated Contact with Patient 07/08/13 1626     Chief Complaint  Patient presents with  . Shortness of Breath  . Hyperglycemia  . 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 The patient is also concerned his sugars have been elevated. Patient states he first noticed she became short of breath while gardening. He states that about 15 minutes of work before he states he became severely winded. He states he has been visiting from [place]. He states he noticed a drastic increase in his shortness of breath today. Patient is supposed to be taking carvedilol for his rate control but missed 2 doses the day before yesterday and the day before that. Patient is denying any chest tightness or pain.   Past Medical History  Diagnosis Date  . MI (myocardial infarction)   . Diabetes mellitus without complication   . Hypertension   . Atrial fibrillation   . Diabetic neuropathy    Past Surgical History  Procedure Laterality Date  . Appendectomy    . Tonsillectomy     No family history on file. History  Substance Use Topics  . Smoking status: Never Smoker   . Smokeless tobacco: Never Used  . Alcohol Use: Yes     Comment: Socially    Review of Systems  Allergies  Statins  Home Medications   Current Outpatient Rx  Name  Route  Sig  Dispense  Refill  . amLODipine (NORVASC) 5 MG tablet   Oral   Take 5 mg by mouth every evening.         Marland Kitchen aspirin EC 81 MG tablet   Oral   Take 81 mg by mouth daily.         . carvedilol (COREG) 6.25 MG tablet   Oral   Take  6.25 mg by mouth 2 (two) times daily with a meal.         . cholecalciferol (VITAMIN D) 1000 UNITS tablet   Oral   Take 1,000 Units by mouth daily.         . clopidogrel (PLAVIX) 75 MG tablet   Oral   Take 75 mg by mouth daily with breakfast.         . co-enzyme Q-10 30 MG capsule   Oral   Take 30 mg by mouth every evening.         . Cyanocobalamin (VITAMIN B-12 IJ)   Injection   Inject 1,000 mg as directed every 30 (thirty) days.          . folic acid (FOLVITE) 1 MG tablet   Oral   Take 1 mg by mouth daily.         . hydrochlorothiazide (MICROZIDE) 12.5 MG capsule   Oral   Take 12.5 mg by mouth daily.         Marland Kitchen losartan (COZAAR) 50 MG tablet   Oral   Take 50 mg by mouth 2 (two) times daily.         . metFORMIN (GLUCOPHAGE) 500 MG tablet   Oral   Take 1,000 mg by mouth 2 (two) times daily with a meal.         .  Multiple Vitamin (MULTIVITAMIN WITH MINERALS) TABS tablet   Oral   Take 1 tablet by mouth 2 (two) times daily.          Marland Kitchen omega-3 acid ethyl esters (LOVAZA) 1 G capsule   Oral   Take 1 g by mouth daily.         . vitamin C (ASCORBIC ACID) 500 MG tablet   Oral   Take 500 mg by mouth 2 (two) times daily.          BP 137/73  Pulse 100  Temp(Src) 97.9 F (36.6 C) (Oral)  Resp 24  Ht 6' (1.829 m)  Wt 200 lb (90.719 kg)  BMI 27.12 kg/m2  SpO2 96% Physical Exam  Constitutional: He is oriented to person, place, and time. He appears well-developed and well-nourished. No distress.  HENT:  Head: Normocephalic and atraumatic.  Right Ear: External ear normal.  Left Ear: External ear normal.  Nose: Nose normal.  Eyes: Conjunctivae are normal.  Neck: Neck supple.  Cardiovascular: Normal heart sounds and intact distal pulses.  An irregularly irregular rhythm present. Tachycardia present.   Pulmonary/Chest: Effort normal. No respiratory distress. He has rales.  Abdominal: Soft. Bowel sounds are normal. There is no tenderness.   Musculoskeletal: Normal range of motion. He exhibits no edema.  Lymphadenopathy:    He has no cervical adenopathy.  Neurological: He is alert and oriented to person, place, and time.  Skin: Skin is warm and dry. He is not diaphoretic.    ED Course  Procedures (including critical care time) Labs Review Labs Reviewed  CBC - Abnormal; Notable for the following:    HCT 37.7 (*)    All other components within normal limits  BASIC METABOLIC PANEL - Abnormal; Notable for the following:    CO2 18 (*)    Glucose, Bld 221 (*)    BUN 42 (*)    Creatinine, Ser 1.43 (*)    GFR calc non Af Amer 47 (*)    GFR calc Af Amer 55 (*)    All other components within normal limits  PRO B NATRIURETIC PEPTIDE - Abnormal; Notable for the following:    Pro B Natriuretic peptide (BNP) 2939.0 (*)    All other components within normal limits  GLUCOSE, CAPILLARY - Abnormal; Notable for the following:    Glucose-Capillary 219 (*)    All other components within normal limits  HEPATIC FUNCTION PANEL - Abnormal; Notable for the following:    Albumin 3.4 (*)    All other components within normal limits  POCT I-STAT TROPONIN I - Abnormal; Notable for the following:    Troponin i, poc 0.21 (*)    All other components within normal limits  TROPONIN I  PROTIME-INR  APTT  URINALYSIS, ROUTINE W REFLEX MICROSCOPIC   Imaging Review Dg Chest 2 View  07/08/2013   CLINICAL DATA:  Short of breath.  Chest pain.  EXAM: CHEST  2 VIEW  COMPARISON:  None.  FINDINGS: There is opacity at the left lung base partly silhouetting the left hemidiaphragm. This is consistent with pneumonia, or other infiltrate, likely with a minimal pleural effusion.  The lungs are otherwise clear. No right pleural effusion. No pneumothorax.  Cardiac silhouette is normal in size. The mediastinum and hilar contours are unremarkable.  IMPRESSION: Left lower lobe infiltrate.   Electronically Signed   By: Amie Portland M.D.   On: 07/08/2013 16:41     EKG Interpretation    Date/Time:  Friday July 08 2013 15:59:18 EST Ventricular Rate:  143 PR Interval:    QRS Duration: 142 QT Interval:  337 QTC Calculation: 520 R Axis:   50 Text Interpretation:  Atrial fibrillation Left bundle branch block No prior available for comparison Confirmed by DOCHERTY  MD, MEGAN [id number]) on 07/08/2013 4:05:14 PM           CRITICAL CARE Performed by: Francee Piccolo L   Total critical care time: 30 minutes  Critical care time was exclusive of separately billable procedures and treating other patients.  Critical care was necessary to treat or prevent imminent or life-threatening deterioration.  Critical care was time spent personally by me on the following activities: development of treatment plan with patient and/or surrogate as well as nursing, discussions with consultants, evaluation of patient's response to treatment, examination of patient, obtaining history from patient or surrogate, ordering and performing treatments and interventions, ordering and review of laboratory studies, ordering and review of radiographic studies, pulse oximetry and re-evaluation of patient's condition.   MDM   1. Atrial fibrillation   2. COPD (chronic obstructive pulmonary disease)   3. CAP (community acquired pneumonia)    Afebrile, NAD, non-toxic appearing, AAO at baseline. I have reviewed nursing notes, vital signs, and all appropriate lab and imaging results for this patient. Patient in Atrial Fibrillation, not rate controlled. Required two Cardizem boluses with eventual Cardizem drip w/ eventual reduction in heart rate appreciated in ED. Heart rate initially in 140s with decreased in 110s after drip initiated. Shortness of Breath likely related to pneumonia noted on CXR. IV Antibiotics started to cover for CAP. Patient admitted to stepdown for further evaluation and management. Patient d/w with Dr. Micheline Maze, agrees with plan.     Jeannetta Ellis, PA-C 07/09/13 0115

## 2013-07-08 NOTE — Progress Notes (Signed)
   CARE MANAGEMENT ED NOTE 07/08/2013  Patient:  Darius Saunders   Account Number:  [id number]  Date Initiated:  07/08/2013  Documentation initiated by:  Radford Pax  Subjective/Objective Assessment:   Patient presents to the ED with shortness of breath.     Subjective/Objective Assessment Detail:   Patient with history of Afib.     Action/Plan:   Action/Plan Detail:   Anticipated DC Date:       Status Recommendation to Physician:   Result of Recommendation:    Other ED Services  Consult Working Plan    DC Planning Services  Other  PCP issues    Choice offered to / List presented to:            Status of service:  Completed, signed off  ED Comments:   ED Comments Detail:  Patient confirms his pcp is Dr. Hubert Azure in [place] at the [facility].  System updated.

## 2013-07-08 NOTE — Progress Notes (Signed)
Assessed patient for Adult Wheeze protocol. Patient has clear BBS diminished in the left lung mid and lower fields. No O2 required at this time. X-ray shows possible Pneumonia and no need for breathing treatment at this time. Will continue to monitor.

## 2013-07-08 NOTE — ED Notes (Addendum)
Positive I stat Trop - PA Victorino Dike and RN aware.

## 2013-07-08 NOTE — Progress Notes (Signed)
Utilization Review completed.  Ilena Dieckman RN CM  

## 2013-07-09 ENCOUNTER — Inpatient Hospital Stay (HOSPITAL_COMMUNITY): Payer: Medicare Other

## 2013-07-09 ENCOUNTER — Encounter (HOSPITAL_COMMUNITY): Payer: Self-pay | Admitting: *Deleted

## 2013-07-09 DIAGNOSIS — I2510 Atherosclerotic heart disease of native coronary artery without angina pectoris: Secondary | ICD-10-CM | POA: Diagnosis present

## 2013-07-09 DIAGNOSIS — I4891 Unspecified atrial fibrillation: Secondary | ICD-10-CM

## 2013-07-09 DIAGNOSIS — J189 Pneumonia, unspecified organism: Secondary | ICD-10-CM

## 2013-07-09 DIAGNOSIS — I059 Rheumatic mitral valve disease, unspecified: Secondary | ICD-10-CM

## 2013-07-09 DIAGNOSIS — J9601 Acute respiratory failure with hypoxia: Secondary | ICD-10-CM | POA: Diagnosis present

## 2013-07-09 DIAGNOSIS — J9600 Acute respiratory failure, unspecified whether with hypoxia or hypercapnia: Secondary | ICD-10-CM

## 2013-07-09 LAB — BLOOD GAS, ARTERIAL
Acid-base deficit: 4.2 mmol/L — ABNORMAL HIGH (ref 0.0–2.0)
Bicarbonate: 17.9 mEq/L — ABNORMAL LOW (ref 20.0–24.0)
FIO2: 0.5 %
O2 Saturation: 92.7 %
Patient temperature: 98.6
pO2, Arterial: 67.6 mmHg — ABNORMAL LOW (ref 80.0–100.0)

## 2013-07-09 LAB — TROPONIN I
Troponin I: <0.3 ng/mL (ref <0.30)
Troponin I: <0.3 ng/mL (ref <0.30)

## 2013-07-09 LAB — GLUCOSE, CAPILLARY
Glucose-Capillary: 168 mg/dL — ABNORMAL HIGH (ref 70–99)
Glucose-Capillary: 323 mg/dL — ABNORMAL HIGH (ref 70–99)

## 2013-07-09 LAB — INFLUENZA PANEL BY PCR (TYPE A & B): Influenza A By PCR: NEGATIVE

## 2013-07-09 LAB — CBC
HCT: 35.2 % — ABNORMAL LOW (ref 39.0–52.0)
Hemoglobin: 12.3 g/dL — ABNORMAL LOW (ref 13.0–17.0)
MCV: 88 fL (ref 78.0–100.0)
RBC: 4 MIL/uL — ABNORMAL LOW (ref 4.22–5.81)
RDW: 13.2 % (ref 11.5–15.5)
WBC: 8.5 10*3/uL (ref 4.0–10.5)

## 2013-07-09 LAB — BASIC METABOLIC PANEL
BUN: 40 mg/dL — ABNORMAL HIGH (ref 6–23)
CO2: 19 mEq/L (ref 19–32)
Calcium: 9 mg/dL (ref 8.4–10.5)
Chloride: 105 mEq/L (ref 96–112)
GFR calc Af Amer: 63 mL/min — ABNORMAL LOW (ref >90)
Potassium: 3.6 mEq/L (ref 3.5–5.1)
Sodium: 137 mEq/L (ref 135–145)

## 2013-07-09 LAB — STREP PNEUMONIAE URINARY ANTIGEN: Strep Pneumo Urinary Antigen: NEGATIVE

## 2013-07-09 LAB — HIV ANTIBODY (ROUTINE TESTING W REFLEX): HIV: NONREACTIVE

## 2013-07-09 MED ORDER — GUAIFENESIN-DM 100-10 MG/5ML PO SYRP
5.0000 mL | ORAL_SOLUTION | ORAL | Status: DC | PRN
  Administered 2013-07-09 – 2013-07-10 (×2): 5 mL via ORAL
  Filled 2013-07-09 (×2): qty 10

## 2013-07-09 MED ORDER — HYDRALAZINE HCL 25 MG PO TABS
25.0000 mg | ORAL_TABLET | Freq: Four times a day (QID) | ORAL | Status: DC
  Administered 2013-07-09 – 2013-07-10 (×2): 25 mg via ORAL
  Filled 2013-07-09 (×7): qty 1

## 2013-07-09 MED ORDER — APIXABAN 5 MG PO TABS
5.0000 mg | ORAL_TABLET | Freq: Two times a day (BID) | ORAL | Status: DC
  Administered 2013-07-09 – 2013-07-13 (×9): 5 mg via ORAL
  Filled 2013-07-09 (×10): qty 1

## 2013-07-09 MED ORDER — ACETAMINOPHEN 160 MG/5ML PO SOLN: 650.0000 mg | Freq: Four times a day (QID) | ORAL | Status: DC | PRN

## 2013-07-09 MED ORDER — ALBUTEROL SULFATE (5 MG/ML) 0.5% IN NEBU
INHALATION_SOLUTION | RESPIRATORY_TRACT | Status: AC
  Administered 2013-07-09: 2.5 mg
  Filled 2013-07-09: qty 0.5

## 2013-07-09 MED ORDER — HYDRALAZINE HCL 20 MG/ML IJ SOLN
10.0000 mg | Freq: Four times a day (QID) | INTRAMUSCULAR | Status: DC | PRN
  Administered 2013-07-09: 10 mg via INTRAVENOUS
  Filled 2013-07-09: qty 1

## 2013-07-09 MED ORDER — LEVALBUTEROL HCL 0.63 MG/3ML IN NEBU
0.6300 mg | INHALATION_SOLUTION | Freq: Four times a day (QID) | RESPIRATORY_TRACT | Status: DC
  Administered 2013-07-09 – 2013-07-10 (×4): 0.63 mg via RESPIRATORY_TRACT
  Filled 2013-07-09 (×10): qty 3

## 2013-07-09 MED ORDER — LEVALBUTEROL HCL 0.63 MG/3ML IN NEBU: 0.6300 mg | INHALATION_SOLUTION | Freq: Four times a day (QID) | RESPIRATORY_TRACT | Status: DC | PRN

## 2013-07-09 MED ORDER — IOHEXOL 350 MG/ML SOLN
100.0000 mL | Freq: Once | INTRAVENOUS | Status: AC | PRN
  Administered 2013-07-09: 100 mL via INTRAVENOUS

## 2013-07-09 MED ORDER — METHYLPREDNISOLONE SODIUM SUCC 125 MG IJ SOLR
60.0000 mg | Freq: Four times a day (QID) | INTRAMUSCULAR | Status: DC
  Administered 2013-07-09 – 2013-07-10 (×4): 60 mg via INTRAVENOUS
  Filled 2013-07-09: qty 0.96
  Filled 2013-07-09: qty 2
  Filled 2013-07-09 (×2): qty 0.96
  Filled 2013-07-09: qty 2
  Filled 2013-07-09 (×5): qty 0.96

## 2013-07-09 MED ORDER — FUROSEMIDE 10 MG/ML IJ SOLN
40.0000 mg | Freq: Three times a day (TID) | INTRAMUSCULAR | Status: AC
  Administered 2013-07-09 (×2): 40 mg via INTRAVENOUS
  Filled 2013-07-09 (×2): qty 4

## 2013-07-09 NOTE — ED Provider Notes (Signed)
Medical screening examination/treatment/procedure(s) were conducted as a shared visit with non-physician practitioner(s) and myself.  I personally evaluated the patient during the encounter. Pt presents w/ SOB, found to be in afib w/ RVR, and to have a pna.  Pt in NAD on PE, mentating well.  Dilt gtt started after HR did not improved with bolus dose x2.  CAP abx started. Pt admitted to [facility].   EKG Interpretation    Date/Time:  Friday July 08 2013 15:59:18 EST Ventricular Rate:  143 PR Interval:    QRS Duration: 142 QT Interval:  337 QTC Calculation: 520 R Axis:   50 Text Interpretation:  Atrial fibrillation Left bundle branch block No prior available for comparison Confirmed by DOCHERTY  MD, MEGAN ([id number]) on 07/08/2013 4:05:14 PM              Shanna Cisco, MD 07/09/13 1143

## 2013-07-09 NOTE — Progress Notes (Signed)
Sister reported that patient has occasional confusion since he had a MI.

## 2013-07-09 NOTE — Progress Notes (Signed)
Pt still with slight dyspnea and cough. Pt still with wheezing. o2 sats down to 89% on 6L/[place]. Midlevel paged awaiting call back.

## 2013-07-09 NOTE — Progress Notes (Signed)
eLink Physician-Brief Progress Note Patient Name: Darius Saunders DOB: [birth year] MRN: [id number]  Date of Service  07/09/2013   HPI/Events of Note   fever  eICU Interventions  Tylenol      [name] 07/09/2013, 6:31 PM

## 2013-07-09 NOTE — Progress Notes (Signed)
Pt with increased cough. Pt with wheezing and increased sob. Pt on 4l/[place] o2 and sats 90%

## 2013-07-09 NOTE — Progress Notes (Signed)
[facility] PROGRESS NOTE  Darius Saunders OZH:[id number] DOB: 08/19/1940 DOA: 07/08/2013 PCP: Provider Not In System  Brief narrative: 72 y.o. male with past medical history of CAD and stent placements, hypertension, diabetes who presented to [facility] ED 07/08/2013 with worsening shortness of breath and chest pain started about one day prior to this admission. Patient has cough but not report sputum production. Patient reported he has chest pain when he tries to cough. No reports of fevers or chills. In ED, BP was 103/78, HR 52 - 123, RR 18-31 and oxygen saturation was 89% on the admission. CXR revealed left lower lobe infiltrate. Cardiac enzymes were WNL. The 12 lead EKG was significant for atrial fibrillation for which reason he has required Cardizem drip.  Assessment/Plan:  Principal Problem:   Acute respiratory failure with hypoxia - Likely secondary to left lower lobe pneumonia although suspicion is for possible pulmonary embolism considering extensive cardiac history in past and atrial fibrillation on this admission - Obtain stat ABGs and CT angio chest - Patient is saturating 93% on Ventimask 50% - appreciate PCCM consult and recommendations  - started xopenex every 6 hours nebulizer treatment  - started  solumedrol 60 mg IV  Every 6 hours  Active Problems:   CAP (community acquired pneumonia) - left lower lobe  - pneumonia order set in place - started azithromycin and rocephin on the admission - follow up blood culture results, strep, legionella, influenza   Atrial fibrillation with RVR - on Cardizem drip - appreciate cardio consult    DM2 (diabetes mellitus, type 2) - hold off on metformin - start sliding scale insulin    Code Status: full code Family Communication: no family at the bedside Disposition Plan: remains inpatient  Manson Passey, MD  [facility] Pager [phone]  If 7PM-7AM, please contact night-coverage [email or web address] Password [email or web address] 07/09/2013,  7:25 AM   LOS: 1 day   Consultants:  PCCM  Cardiology  Procedures:  None   Antibiotics:  Azithromycin 07/08/2013 -->  Rocephin 07/08/2013 -->  HPI/Subjective: Oxyen saturation drop once off of venti mask.  Objective: Filed Vitals:   07/09/13 0400 07/09/13 0500 07/09/13 0530 07/09/13 0600  BP: 120/72 149/87  136/83  Pulse: 95 90 99 92  Temp: 98.2 F (36.8 C)     TempSrc: Axillary     Resp: 24 27 30 26   Height:      Weight: 94.5 kg (208 lb 5.4 oz)     SpO2: 91% 90% 91% 91%    Intake/Output Summary (Last 24 hours) at 07/09/13 0725 Last data filed at 07/09/13 0500  Gross per 24 hour  Intake 1369.08 ml  Output    700 ml  Net 669.08 ml    Exam:   General:  Pt is alert, follows commands appropriately, not in acute distress  Cardiovascular: irregular rhythm, S1/S2 appreciated   Respiratory: some coarse breath sounds but no wheezing appreciated   Abdomen: Soft, non tender, non distended, bowel sounds present, no guarding  Extremities: No edema, pulses DP and PT palpable bilaterally  Neuro: Grossly nonfocal  Data Reviewed: Basic Metabolic Panel:  Recent Labs Lab 07/08/13 1610 07/09/13 0230  NA 135 137  K 4.1 3.6  CL 101 105  CO2 18* 19  GLUCOSE 221* 213*  BUN 42* 40*  CREATININE 1.43* 1.28  CALCIUM 9.4 9.0   Liver Function Tests:  Recent Labs Lab 07/08/13 1648  AST 23  ALT 22  ALKPHOS 91  BILITOT 0.6  PROT 6.5  ALBUMIN 3.4*  No results found for this basename: LIPASE, AMYLASE,  in the last 168 hours No results found for this basename: AMMONIA,  in the last 168 hours CBC:  Recent Labs Lab 07/08/13 1610 07/09/13 0230  WBC 9.2 8.5  HGB 13.2 12.3*  HCT 37.7* 35.2*  MCV 87.9 88.0  PLT 207 200   Cardiac Enzymes:  Recent Labs Lab 07/08/13 1626 07/08/13 2030 07/09/13 0230  TROPONINI <0.30 <0.30 <0.30   BNP: No components found with this basename: POCBNP,  CBG:  Recent Labs Lab 07/08/13 1553 07/08/13 2343  GLUCAP  219* 221*    MRSA PCR SCREENING     Status: None   Collection Time    07/08/13 11:12 PM      Result Value Range Status   MRSA by PCR NEGATIVE  NEGATIVE Final     Studies: Dg Chest 2 View 07/08/2013   IMPRESSION: Left lower lobe infiltrate.   Electronically Signed   By: Amie Portland M.D.   On: 07/08/2013 16:41    Scheduled Meds: . azithromycin  500 mg Intravenous Q24H  . cefTRIAXone (ROCEPHIN)  IV  1 g Intravenous Q24H  . heparin  5,000 Units Subcutaneous Q8H  . insulin aspart  0-15 Units Subcutaneous TID WC   Continuous Infusions: . sodium chloride    . sodium chloride 125 mL/hr at 07/08/13 2339  . diltiazem (CARDIZEM) infusion 15 mg/hr (07/08/13 2150)

## 2013-07-09 NOTE — Consult Note (Addendum)
Name: Darius Saunders MRN: [id number] DOB: [birth year]    ADMISSION DATE:  07/08/2013 CONSULTATION DATE:  07/09/2013   REFERRING MD :  [facility] PRIMARY SERVICE:  TRH  CHIEF COMPLAINT:   Hypoxemia, chest pain   BRIEF PATIENT DESCRIPTION:  72 y.o.M CAD, CAF, adm with chest pain and dyspnea with cough prod thick yellow.  Pt with CAP LLL and PAF RVR with mild pul edema and hypoxia.  PCCM consulted  SIGNIFICANT EVENTS / STUDIES:    LINES / TUBES: PIV  CULTURES: resp viral 12/27 sputu c/s 12/27 Influenza pcr 12/27  ANTIBIOTICS: Rocephin 12/27 azithro 12/27  HISTORY OF PRESENT ILLNESS:   72 y.o.M here for Holidays. Lives in [place].  Medical [facility] at [facility].  Hx of CAD CAF Stents .  Acute onset of cough, chills, dyspnea, chest pain.  Pt with PAF RVR.  Pt with no fever or body aches. Pt worsening   PAST MEDICAL HISTORY :  Past Medical History  Diagnosis Date  . MI (myocardial infarction)   . Diabetes mellitus without complication   . Hypertension   . Atrial fibrillation   . Diabetic neuropathy    Past Surgical History  Procedure Laterality Date  . Appendectomy    . Tonsillectomy     Prior to Admission medications   Medication Sig Start Date End Date Taking? Authorizing Provider  amLODipine (NORVASC) 5 MG tablet Take 5 mg by mouth every evening.   Yes Historical Provider, MD  aspirin EC 81 MG tablet Take 81 mg by mouth daily.   Yes Historical Provider, MD  carvedilol (COREG) 6.25 MG tablet Take 6.25 mg by mouth 2 (two) times daily with a meal.   Yes Historical Provider, MD  cholecalciferol (VITAMIN D) 1000 UNITS tablet Take 1,000 Units by mouth daily.   Yes Historical Provider, MD  clopidogrel (PLAVIX) 75 MG tablet Take 75 mg by mouth daily with breakfast.   Yes Historical Provider, MD  co-enzyme Q-10 30 MG capsule Take 30 mg by mouth every evening.   Yes Historical Provider, MD  Cyanocobalamin (VITAMIN B-12 IJ) Inject 1,000 mg as directed every 30 (thirty)  days.    Yes Historical Provider, MD  folic acid (FOLVITE) 1 MG tablet Take 1 mg by mouth daily.   Yes Historical Provider, MD  hydrochlorothiazide (MICROZIDE) 12.5 MG capsule Take 12.5 mg by mouth daily.   Yes Historical Provider, MD  losartan (COZAAR) 50 MG tablet Take 50 mg by mouth 2 (two) times daily.   Yes Historical Provider, MD  metFORMIN (GLUCOPHAGE) 500 MG tablet Take 1,000 mg by mouth 2 (two) times daily with a meal.   Yes Historical Provider, MD  Multiple Vitamin (MULTIVITAMIN WITH MINERALS) TABS tablet Take 1 tablet by mouth 2 (two) times daily.    Yes Historical Provider, MD  omega-3 acid ethyl esters (LOVAZA) 1 G capsule Take 1 g by mouth daily.   Yes Historical Provider, MD  vitamin C (ASCORBIC ACID) 500 MG tablet Take 500 mg by mouth 2 (two) times daily.   Yes Historical Provider, MD   Allergies  Allergen Reactions  . Statins Other (See Comments)    Rash, memory loss    FAMILY HISTORY:  History reviewed. No pertinent family history. SOCIAL HISTORY:  reports that he has never smoked. He has never used smokeless tobacco. He reports that he drinks alcohol. He reports that he does not use illicit drugs.  REVIEW OF SYSTEMS:   Pos in Bold Constitutional:  fever, chills, weight  loss, malaise/fatigue and diaphoresis.  HENT: Negative for hearing loss, ear pain, nosebleeds, congestion, sore throat, neck pain, tinnitus and ear discharge.   Eyes: Negative for blurred vision, double vision, photophobia, pain, discharge and redness.  Respiratory:  cough, hemoptysis, sputum production, shortness of breath, wheezing and stridor.   Cardiovascular: r chest pain, palpitations, orthopnea, claudication, leg swelling and PND.  Gastrointestinal: Negative for heartburn, nausea, vomiting, abdominal pain, diarrhea, constipation, blood in stool and melena.  Genitourinary: Negative for dysuria, urgency, frequency, hematuria and flank pain.  Musculoskeletal: Negative for myalgias, back pain, joint  pain and falls.  Skin: Negative for itching and rash.  Neurological: Negative for dizziness, tingling, tremors, sensory change, speech change, focal weakness, seizures, loss of consciousness, weakness and headaches.  Endo/Heme/Allergies: Negative for environmental allergies and polydipsia. Does not bruise/bleed easily.  SUBJECTIVE:  Still dyspneic VITAL SIGNS: Temp:  [97.9 F (36.6 C)-98.7 F (37.1 C)] 98.7 F (37.1 C) (12/27 0745) Pulse Rate:  [52-123] 90 (12/27 0745) Resp:  [18-31] 28 (12/27 0745) BP: (103-149)/(67-96) 134/76 mmHg (12/27 0700) SpO2:  [89 %-100 %] 93 % (12/27 0745) FiO2 (%):  [50 %] 50 % (12/27 0745) Weight:  [90.719 kg (200 lb)-94.5 kg (208 lb 5.4 oz)] 94.5 kg (208 lb 5.4 oz) (12/27 0400)  PHYSICAL EXAMINATION: General:  No acute distress Neuro:  Non focal HEENT:  No jvd   Neck:  Supple  Cardiovascular:  irreg irreg nl s1 s2 no s s4 Lungs:  Exp wheezes Abdomen:  Soft NT Musculoskeletal:  from Skin:  clear   Recent Labs Lab 07/08/13 1610 07/09/13 0230  NA 135 137  K 4.1 3.6  CL 101 105  CO2 18* 19  BUN 42* 40*  CREATININE 1.43* 1.28  GLUCOSE 221* 213*    Recent Labs Lab 07/08/13 1610 07/09/13 0230  HGB 13.2 12.3*  HCT 37.7* 35.2*  WBC 9.2 8.5  PLT 207 200   Dg Chest 2 View  07/08/2013   CLINICAL DATA:  Short of breath.  Chest pain.  EXAM: CHEST  2 VIEW  COMPARISON:  None.  FINDINGS: There is opacity at the left lung base partly silhouetting the left hemidiaphragm. This is consistent with pneumonia, or other infiltrate, likely with a minimal pleural effusion.  The lungs are otherwise clear. No right pleural effusion. No pneumothorax.  Cardiac silhouette is normal in size. The mediastinum and hilar contours are unremarkable.  IMPRESSION: Left lower lobe infiltrate.   Electronically Signed   By: Amie Portland M.D.   On: 07/08/2013 16:41    ASSESSMENT / PLAN:  #1 Acute hypoxemic resp failure d/t pulm edema, LLL CAP, Afib RVR.   BNP elevated.  Prob Acute systolic heart failure. Need EF.  No records   Plan   Need records from [facility]   Diurese    Cards consult needed   BDs   Steroids   Flutter   Rate control   chk viral studies  ` Droplet   Cont roc/azithro   Titrate oxygen   Agree with CT angio chest   Dorcas Carrow Beeper  [phone]  Cell  [phone]  If no response or cell goes to voicemail, call beeper [phone]  Pulmonary and Critical Care Medicine [facility] Pager: [phone]  07/09/2013, 9:16 AM

## 2013-07-09 NOTE — Consult Note (Addendum)
CONSULT NOTE  Date: 07/09/2013               Patient Name:  Eren Ryser MRN: [id number]  DOB: 10-May-1941 Age / Sex: 72 y.o., male        PCP: Provider Not In System Primary Cardiologist: New/Keilen Kahl            Referring Physician: Manson Passey, MD              Reason for Consult: Atrial fib           History of Present Illness: Patient is a 72 y.o. male with a PMHx of CAD, (LAD- 3.0 x 18 mm Xience Xpedition and  2.75 x 8 mm Xience Xpedition, RCA - 2.5x15 Xience Xpedition, 08/29/2011) , HTN, DM  who was admitted to [facility] on 07/08/2013 for evaluation of pneumonia and worsening dyspnea.  He was found to have a-fib and we were asked to see. Marland Kitchen   He developed cough and worsineing dypsnea 5 days ago.  No fever.  Came to [facility] Friday.    Was found to have Afib.   He has had Afib in the past when he presented with MI Feb. 2013.  He had stenting 2 days later.  He has not had any chest pain.   He has had some indigestion.   He tries to exercise - wears a step counter - trys to get 5000 steps a day.  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     Medications: Outpatient medications: Prescriptions prior to admission  Medication Sig Dispense Refill  . amLODipine (NORVASC) 5 MG tablet Take 5 mg by mouth every evening.      Marland Kitchen aspirin EC 81 MG tablet Take 81 mg by mouth daily.      . carvedilol (COREG) 6.25 MG tablet Take 6.25 mg by mouth 2 (two) times daily with a meal.      . cholecalciferol (VITAMIN D) 1000 UNITS tablet Take 1,000 Units by mouth daily.      . clopidogrel (PLAVIX) 75 MG tablet Take 75 mg by mouth daily with breakfast.      . co-enzyme Q-10 30 MG capsule Take 30 mg by mouth every evening.      . Cyanocobalamin (VITAMIN B-12 IJ) Inject 1,000 mg as directed every 30 (thirty) days.       . folic acid (FOLVITE) 1 MG tablet Take 1 mg by mouth daily.      . hydrochlorothiazide (MICROZIDE) 12.5 MG capsule Take 12.5 mg by mouth daily.      Marland Kitchen  losartan (COZAAR) 50 MG tablet Take 50 mg by mouth 2 (two) times daily.      . metFORMIN (GLUCOPHAGE) 500 MG tablet Take 1,000 mg by mouth 2 (two) times daily with a meal.      . Multiple Vitamin (MULTIVITAMIN WITH MINERALS) TABS tablet Take 1 tablet by mouth 2 (two) times daily.       Marland Kitchen omega-3 acid ethyl esters (LOVAZA) 1 G capsule Take 1 g by mouth daily.      . vitamin C (ASCORBIC ACID) 500 MG tablet Take 500 mg by mouth 2 (two) times daily.        Current medications: Current Facility-Administered Medications  Medication Dose Route Frequency Provider Last Rate Last Dose  . 0.9 %  sodium chloride infusion  1,000 mL Intravenous Continuous Jennifer L Piepenbrink, PA-C      . 0.9 %  sodium chloride infusion   Intravenous Continuous Hillary Bow, DO 125 mL/hr at 07/08/13 2339    . azithromycin (ZITHROMAX) 500 mg in dextrose 5 % 250 mL IVPB  500 mg Intravenous Q24H Hillary Bow, DO      . cefTRIAXone (ROCEPHIN) 1 g in dextrose 5 % 50 mL IVPB  1 g Intravenous Q24H Jared M Gardner, DO      . diltiazem (CARDIZEM) 100 mg in dextrose 5 % 100 mL infusion  5-15 mg/hr Intravenous Titrated Jennifer L Piepenbrink, PA-C 15 mL/hr at 07/09/13 1318 15 mg/hr at 07/09/13 1318  . furosemide (LASIX) injection 40 mg  40 mg Intravenous Q8H Storm Frisk, MD   40 mg at 07/09/13 1039  . guaiFENesin-dextromethorphan (ROBITUSSIN DM) 100-10 MG/5ML syrup 5 mL  5 mL Oral Q4H PRN Rolan Lipa, NP   5 mL at 07/09/13 0454  . heparin injection 5,000 Units  5,000 Units Subcutaneous Q8H Hillary Bow, DO   5,000 Units at 07/09/13 4098  . insulin aspart (novoLOG) injection 0-15 Units  0-15 Units Subcutaneous TID WC Hillary Bow, DO      . levalbuterol [facility]) nebulizer solution 0.63 mg  0.63 mg Nebulization Q6H Storm Frisk, MD   0.63 mg at 07/09/13 0959  . methylPREDNISolone sodium succinate (SOLU-MEDROL) 125 mg/2 mL injection 60 mg  60 mg Intravenous Q6H Storm Frisk, MD   60 mg at 07/09/13  1039     Allergies  Allergen Reactions  . Statins Other (See Comments)    Rash, memory loss     Past Medical History  Diagnosis Date  . MI (myocardial infarction)   . Diabetes mellitus without complication   . Hypertension   . Atrial fibrillation   . Diabetic neuropathy     Past Surgical History  Procedure Laterality Date  . Appendectomy    . Tonsillectomy      History reviewed. No pertinent family history. Family hx is negative for heart disease  Social History:  reports that he has never smoked. He has never used smokeless tobacco. He reports that he drinks alcohol. He reports that he does not use illicit drugs.   Review of Systems: Constitutional:  denies fever, chills, diaphoresis, appetite change and fatigue.  HEENT: denies photophobia, eye pain, redness, hearing loss, ear pain, congestion, sore throat, rhinorrhea, sneezing, neck pain, neck stiffness and tinnitus.  Respiratory: admits to SOB, DOE, cough, chest tightness, and wheezing.  Cardiovascular: denies chest pain, palpitations and leg swelling.  Gastrointestinal: denies nausea, vomiting, abdominal pain, diarrhea, constipation, blood in stool.  Genitourinary: denies dysuria, urgency, frequency, hematuria, flank pain and difficulty urinating.  Musculoskeletal: denies  myalgias, back pain, joint swelling, arthralgias and gait problem.   Skin: denies pallor, rash and wound.  Neurological: denies dizziness, seizures, syncope, weakness, light-headedness, numbness and headaches.   Hematological: denies adenopathy, easy bruising, personal or family bleeding history.  Psychiatric/ Behavioral: denies suicidal ideation, mood changes, confusion, nervousness, sleep disturbance and agitation.    Physical Exam: BP 152/91  Pulse 176  Temp(Src) 98.5 F (36.9 C) (Oral)  Resp 25  Ht 6' (1.829 m)  Wt 208 lb 5.4 oz (94.5 kg)  BMI 28.25 kg/m2  SpO2 92%  General: Vital signs reviewed and noted.  Has face mask on  Head:  Normocephalic, atraumatic, sclera anicteric, mucus membranes are moist  Neck: Supple. Negative for carotid bruits. JVD not elevated.  Lungs:  Decreased breath sounds.  Bilateral wheezing.   Heart: Irregl irreg. tachy  Abdomen:  Soft, non-tender, non-distended with normoactive bowel sounds. No hepatomegaly. No rebound/guarding. No obvious abdominal masses  MSK: Strength and the appear normal for age.  Extremities: No clubbing or cyanosis. No edema.  Distal pedal pulses are 2+ and equal bilaterally.  Neurologic: Alert and oriented X 3. Moves all extremities spontaneously.  Psych: Responds to questions appropriately with a normal affect.    Lab results: Basic Metabolic Panel:  Recent Labs Lab 07/08/13 1610 07/09/13 0230  NA 135 137  K 4.1 3.6  CL 101 105  CO2 18* 19  GLUCOSE 221* 213*  BUN 42* 40*  CREATININE 1.43* 1.28  CALCIUM 9.4 9.0    Liver Function Tests:  Recent Labs Lab 07/08/13 1648  AST 23  ALT 22  ALKPHOS 91  BILITOT 0.6  PROT 6.5  ALBUMIN 3.4*   No results found for this basename: LIPASE, AMYLASE,  in the last 168 hours No results found for this basename: AMMONIA,  in the last 168 hours  CBC:  Recent Labs Lab 07/08/13 1610 07/09/13 0230  WBC 9.2 8.5  HGB 13.2 12.3*  HCT 37.7* 35.2*  MCV 87.9 88.0  PLT 207 200    Cardiac Enzymes:  Recent Labs Lab 07/08/13 1626 07/08/13 2030 07/09/13 0230 07/09/13 0810  TROPONINI <0.30 <0.30 <0.30 <0.30    BNP: No components found with this basename: POCBNP,   CBG:  Recent Labs Lab 07/08/13 1553 07/08/13 2343 07/09/13 0804 07/09/13 1216  GLUCAP 219* 221* 168* 179*    Coagulation Studies:  Recent Labs  07/08/13 1648  LABPROT 14.0  INR 1.10     Other results: EKG  ;  Atrial fib with V rate of 143.   Imaging: Dg Chest 2 View  07/08/2013   CLINICAL DATA:  Short of breath.  Chest pain.  EXAM: CHEST  2 VIEW  COMPARISON:  None.  FINDINGS: There is opacity at the left lung base partly  silhouetting the left hemidiaphragm. This is consistent with pneumonia, or other infiltrate, likely with a minimal pleural effusion.  The lungs are otherwise clear. No right pleural effusion. No pneumothorax.  Cardiac silhouette is normal in size. The mediastinum and hilar contours are unremarkable.  IMPRESSION: Left lower lobe infiltrate.   Electronically Signed   By: Amie Portland M.D.   On: 07/08/2013 16:41       Assessment & Plan:  1. Atrial fibrillation with rapid ventricular response: The patient presents with an episode of atrial fibrillation. She's had atrial fibrillation several times in the past. He was thought to be a poor Coumadin candidate because he has some memory problems.    He now presents with an episode of pneumonia/pleural effusions and was found to have atrial fibrillation with a rapid ventricular response.  We do not know his left ventricle systolic function at this time-an echocardiogram is currently pending.  For now I would continue with the diltiazem drip. He'll probably tolerate this better than high doses of beta blockers.  I think that most of his rapid ventricular response is due to his pulmonary disease. I do not think that we will be we'll to completely control his heart rate until his pulmonary issues have improved.  I recommend that he be started on eliquis 5 mg bid. She should be much easier for him to take compared to Coumadin.  He has drug-eluting stents. He should probably go home on Eliquis  and Plavix and we should consider discontinuing the aspirin to help reduce his risk for of  bleeding.  Start Eliquis,  DC SQ heparin   Vesta Mixer, Montez Hageman., MD, [facility] 07/09/2013, 1:57 PM

## 2013-07-10 ENCOUNTER — Inpatient Hospital Stay (HOSPITAL_COMMUNITY): Payer: Medicare Other

## 2013-07-10 DIAGNOSIS — I5021 Acute systolic (congestive) heart failure: Secondary | ICD-10-CM

## 2013-07-10 DIAGNOSIS — I509 Heart failure, unspecified: Secondary | ICD-10-CM

## 2013-07-10 DIAGNOSIS — I5023 Acute on chronic systolic (congestive) heart failure: Principal | ICD-10-CM

## 2013-07-10 DIAGNOSIS — I255 Ischemic cardiomyopathy: Secondary | ICD-10-CM | POA: Diagnosis present

## 2013-07-10 LAB — LEGIONELLA ANTIGEN, URINE: Legionella Antigen, Urine: NEGATIVE

## 2013-07-10 LAB — BASIC METABOLIC PANEL
BUN: 35 mg/dL — ABNORMAL HIGH (ref 6–23)
Calcium: 8.9 mg/dL (ref 8.4–10.5)
Creatinine, Ser: 1.23 mg/dL (ref 0.50–1.35)
GFR calc Af Amer: 66 mL/min — ABNORMAL LOW (ref >90)
GFR calc non Af Amer: 57 mL/min — ABNORMAL LOW (ref >90)
Potassium: 3.4 mEq/L — ABNORMAL LOW (ref 3.5–5.1)

## 2013-07-10 LAB — RESPIRATORY VIRUS PANEL
Adenovirus: NOT DETECTED
Influenza A H1: NOT DETECTED
Influenza A H3: NOT DETECTED
Influenza A: NOT DETECTED
Influenza B: NOT DETECTED
Parainfluenza 1: NOT DETECTED
Parainfluenza 3: NOT DETECTED
Respiratory Syncytial Virus B: NOT DETECTED

## 2013-07-10 LAB — GLUCOSE, CAPILLARY
Glucose-Capillary: 406 mg/dL — ABNORMAL HIGH (ref 70–99)
Glucose-Capillary: 187 mg/dL — ABNORMAL HIGH (ref 70–99)
Glucose-Capillary: 148 mg/dL — ABNORMAL HIGH (ref 70–99)
Glucose-Capillary: 220 mg/dL — ABNORMAL HIGH (ref 70–99)

## 2013-07-10 LAB — CBC WITH DIFFERENTIAL/PLATELET
Basophils Absolute: 0 10*3/uL (ref 0.0–0.1)
Basophils Relative: 0 % (ref 0–1)
MCHC: 36 g/dL (ref 30.0–36.0)
Monocytes Absolute: 0.3 10*3/uL (ref 0.1–1.0)
Neutro Abs: 10.9 10*3/uL — ABNORMAL HIGH (ref 1.7–7.7)
Neutrophils Relative %: 94 % — ABNORMAL HIGH (ref 43–77)
Platelets: 238 10*3/uL (ref 150–400)
RDW: 13.3 % (ref 11.5–15.5)

## 2013-07-10 MED ORDER — DIPHENHYDRAMINE HCL 25 MG PO CAPS
25.0000 mg | ORAL_CAPSULE | Freq: Every evening | ORAL | Status: DC | PRN
Start: 2013-07-10 — End: 2013-07-10

## 2013-07-10 MED ORDER — METOPROLOL TARTRATE 1 MG/ML IV SOLN
5.0000 mg | Freq: Four times a day (QID) | INTRAVENOUS | Status: DC
  Administered 2013-07-10: 5 mg via INTRAVENOUS
  Filled 2013-07-10 (×5): qty 5

## 2013-07-10 MED ORDER — INSULIN ASPART 100 UNIT/ML ~~LOC~~ SOLN
0.0000 [IU] | Freq: Three times a day (TID) | SUBCUTANEOUS | Status: DC
  Administered 2013-07-10 (×2): 4 [IU] via SUBCUTANEOUS
  Administered 2013-07-11: 20 [IU] via SUBCUTANEOUS
  Administered 2013-07-11 (×2): 11 [IU] via SUBCUTANEOUS
  Administered 2013-07-12: 7 [IU] via SUBCUTANEOUS
  Administered 2013-07-12: 4 [IU] via SUBCUTANEOUS
  Administered 2013-07-12: 7 [IU] via SUBCUTANEOUS
  Administered 2013-07-13: 4 [IU] via SUBCUTANEOUS

## 2013-07-10 MED ORDER — METOPROLOL TARTRATE 25 MG PO TABS
25.0000 mg | ORAL_TABLET | Freq: Two times a day (BID) | ORAL | Status: DC
  Administered 2013-07-10 – 2013-07-11 (×4): 25 mg via ORAL
  Filled 2013-07-10 (×6): qty 1

## 2013-07-10 MED ORDER — INSULIN ASPART 100 UNIT/ML ~~LOC~~ SOLN
0.0000 [IU] | Freq: Every day | SUBCUTANEOUS | Status: DC
  Administered 2013-07-10: 2 [IU] via SUBCUTANEOUS

## 2013-07-10 MED ORDER — ZOLPIDEM TARTRATE 5 MG PO TABS
5.0000 mg | ORAL_TABLET | Freq: Every evening | ORAL | Status: DC | PRN
  Administered 2013-07-10: 5 mg via ORAL
  Filled 2013-07-10: qty 1

## 2013-07-10 MED ORDER — METOPROLOL TARTRATE 1 MG/ML IV SOLN
5.0000 mg | Freq: Four times a day (QID) | INTRAVENOUS | Status: DC | PRN
  Administered 2013-07-10 – 2013-07-11 (×3): 5 mg via INTRAVENOUS
  Filled 2013-07-10 (×3): qty 5

## 2013-07-10 MED ORDER — POTASSIUM CHLORIDE CRYS ER 20 MEQ PO TBCR
20.0000 meq | EXTENDED_RELEASE_TABLET | ORAL | Status: AC
  Administered 2013-07-10 (×2): 20 meq via ORAL
  Filled 2013-07-10 (×2): qty 1

## 2013-07-10 MED ORDER — LEVOFLOXACIN 750 MG PO TABS
750.0000 mg | ORAL_TABLET | Freq: Every day | ORAL | Status: DC
  Administered 2013-07-10 – 2013-07-13 (×4): 750 mg via ORAL
  Filled 2013-07-10 (×5): qty 1

## 2013-07-10 MED ORDER — LOSARTAN POTASSIUM 50 MG PO TABS
50.0000 mg | ORAL_TABLET | Freq: Every day | ORAL | Status: DC
  Administered 2013-07-10 – 2013-07-13 (×4): 50 mg via ORAL
  Filled 2013-07-10 (×4): qty 1

## 2013-07-10 MED ORDER — ZOLPIDEM TARTRATE 5 MG PO TABS: 5.0000 mg | ORAL_TABLET | Freq: Every evening | ORAL | Status: DC | PRN

## 2013-07-10 MED ORDER — FUROSEMIDE 10 MG/ML IJ SOLN
40.0000 mg | Freq: Four times a day (QID) | INTRAMUSCULAR | Status: AC
  Administered 2013-07-10 (×2): 40 mg via INTRAVENOUS
  Filled 2013-07-10 (×2): qty 4

## 2013-07-10 NOTE — Consult Note (Signed)
CONSULT NOTE  Date: 07/10/2013               Patient Name:  Darius Saunders MRN: [id number]  DOB: 11-28-1940 Age / Sex: 72 y.o., male        PCP: Provider Not In System Primary Cardiologist: New/Nahser            Referring Physician: Manson Passey, MD              Reason for Consult: Atrial fib         History of Present Illness: Patient is a 72 y.o. male with a PMHx of CAD, (LAD- 3.0 x 18 mm Xience Xpedition and  2.75 x 8 mm Xience Xpedition, RCA - 2.5x15 Xience Xpedition, 08/29/2011) , HTN, DM  who was admitted to [facility] on 07/08/2013 for evaluation of pneumonia and worsening dyspnea.  He was found to have a-fib and we were asked to see. Marland Kitchen   He developed cough and worsineing dypsnea 5 days ago.  No fever.  Came to [facility] Friday.    Was found to have Afib.   He has had Afib in the past when he presented with MI Feb. 2013.  He had stenting 2 days later.  He has not had any chest pain.   He has had some indigestion.   He tries to exercise - wears a step counter - trys to get 5000 steps a day.  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    He remains in rapid AF.   Echo shows severe LV dysfunction - EF 20-25%,  Mild MR.  Akinesis  of the  Mid-basal  anterior wall     Medications: Outpatient medications: Prescriptions prior to admission  Medication Sig Dispense Refill  . amLODipine (NORVASC) 5 MG tablet Take 5 mg by mouth every evening.      Marland Kitchen aspirin EC 81 MG tablet Take 81 mg by mouth daily.      . carvedilol (COREG) 6.25 MG tablet Take 6.25 mg by mouth 2 (two) times daily with a meal.      . cholecalciferol (VITAMIN D) 1000 UNITS tablet Take 1,000 Units by mouth daily.      . clopidogrel (PLAVIX) 75 MG tablet Take 75 mg by mouth daily with breakfast.      . co-enzyme Q-10 30 MG capsule Take 30 mg by mouth every evening.      . Cyanocobalamin (VITAMIN B-12 IJ) Inject 1,000 mg as directed every 30 (thirty) days.       . folic acid (FOLVITE) 1 MG  tablet Take 1 mg by mouth daily.      . hydrochlorothiazide (MICROZIDE) 12.5 MG capsule Take 12.5 mg by mouth daily.      Marland Kitchen losartan (COZAAR) 50 MG tablet Take 50 mg by mouth 2 (two) times daily.      . metFORMIN (GLUCOPHAGE) 500 MG tablet Take 1,000 mg by mouth 2 (two) times daily with a meal.      . Multiple Vitamin (MULTIVITAMIN WITH MINERALS) TABS tablet Take 1 tablet by mouth 2 (two) times daily.       Marland Kitchen omega-3 acid ethyl esters (LOVAZA) 1 G capsule Take 1 g by mouth daily.      . vitamin C (ASCORBIC ACID) 500 MG tablet Take 500 mg by mouth 2 (two) times daily.        Current medications: Current Facility-Administered Medications  Medication Dose Route Frequency Provider  Last Rate Last Dose  . 0.9 %  sodium chloride infusion   Intravenous Continuous Alison Murray, MD 20 mL/hr at 07/09/13 1759 20 mL at 07/09/13 1759  . acetaminophen (TYLENOL) solution 650 mg  650 mg Per Tube Q6H PRN Alyson Reedy, MD      . apixaban (ELIQUIS) tablet 5 mg  5 mg Oral BID Vesta Mixer, MD   5 mg at 07/09/13 2232  . azithromycin (ZITHROMAX) 500 mg in dextrose 5 % 250 mL IVPB  500 mg Intravenous Q24H Hillary Bow, DO   500 mg at 07/09/13 2025  . cefTRIAXone (ROCEPHIN) 1 g in dextrose 5 % 50 mL IVPB  1 g Intravenous Q24H Hillary Bow, DO   1 g at 07/09/13 2025  . diltiazem (CARDIZEM) 100 mg in dextrose 5 % 100 mL infusion  5-15 mg/hr Intravenous Titrated Jennifer L Piepenbrink, PA-C 15 mL/hr at 07/10/13 0330 15 mg/hr at 07/10/13 0330  . guaiFENesin-dextromethorphan (ROBITUSSIN DM) 100-10 MG/5ML syrup 5 mL  5 mL Oral Q4H PRN Rolan Lipa, NP   5 mL at 07/09/13 0454  . insulin aspart (novoLOG) injection 0-15 Units  0-15 Units Subcutaneous TID WC Hillary Bow, DO   3 Units at 07/09/13 1743  . levalbuterol (XOPENEX) nebulizer solution 0.63 mg  0.63 mg Nebulization Q6H Storm Frisk, MD   0.63 mg at 07/10/13 1914  . methylPREDNISolone sodium succinate (SOLU-MEDROL) 125 mg/2 mL injection 60  mg  60 mg Intravenous Q6H Storm Frisk, MD   60 mg at 07/10/13 0503  . metoprolol (LOPRESSOR) injection 5 mg  5 mg Intravenous Q6H Nelda Bucks, MD   5 mg at 07/10/13 0320  . potassium chloride SA (K-DUR,KLOR-CON) CR tablet 20 mEq  20 mEq Oral Q4H Nelda Bucks, MD   20 mEq at 07/10/13 0546  . zolpidem (AMBIEN) tablet 5 mg  5 mg Oral QHS PRN Nelda Bucks, MD   5 mg at 07/10/13 0139     Allergies  Allergen Reactions  . Statins Other (See Comments)    Rash, memory loss     Past Medical History  Diagnosis Date  . MI (myocardial infarction)   . Diabetes mellitus without complication   . Hypertension   . Atrial fibrillation   . Diabetic neuropathy     Past Surgical History  Procedure Laterality Date  . Appendectomy    . Tonsillectomy        Physical Exam: BP 153/100  Pulse 138  Temp(Src) 98.3 F (36.8 C) (Oral)  Resp 24  Ht 6' (1.829 m)  Wt 208 lb 5.4 oz (94.5 kg)  BMI 28.25 kg/m2  SpO2 97%  General: Vital signs reviewed and noted.  Has face mask on  Head: Normocephalic, atraumatic, sclera anicteric, mucus membranes are moist  Neck: Supple. Negative for carotid bruits. JVD not elevated.  Lungs:  Decreased breath sounds.  Bilateral wheezing.   Heart: Irregl irreg. tachy  Abdomen:  Soft, non-tender, non-distended with normoactive bowel sounds. No hepatomegaly. No rebound/guarding. No obvious abdominal masses  MSK: Strength and the appear normal for age.  Extremities: No clubbing or cyanosis. No edema.  Distal pedal pulses are 2+ and equal bilaterally.  Neurologic: Alert and oriented X 3. Moves all extremities spontaneously.  Psych: Responds to questions appropriately with a normal affect.    Lab results: Basic Metabolic Panel:  Recent Labs Lab 07/08/13 1610 07/09/13 0230 07/10/13 0357  NA 135 137 134*  K 4.1  3.6 3.4*  CL 101 105 100  CO2 18* 19 13*  GLUCOSE 221* 213* 390*  BUN 42* 40* 35*  CREATININE 1.43* 1.28 1.23  CALCIUM 9.4 9.0 8.9      Liver Function Tests:  Recent Labs Lab 07/08/13 1648  AST 23  ALT 22  ALKPHOS 91  BILITOT 0.6  PROT 6.5  ALBUMIN 3.4*   No results found for this basename: LIPASE, AMYLASE,  in the last 168 hours No results found for this basename: AMMONIA,  in the last 168 hours  CBC:  Recent Labs Lab 07/08/13 1610 07/09/13 0230 07/10/13 0357  WBC 9.2 8.5 11.6*  NEUTROABS  --   --  10.9*  HGB 13.2 12.3* 14.0  HCT 37.7* 35.2* 38.9*  MCV 87.9 88.0 85.3  PLT 207 200 238    Cardiac Enzymes:  Recent Labs Lab 07/08/13 1626 07/08/13 2030 07/09/13 0230 07/09/13 0810  TROPONINI <0.30 <0.30 <0.30 <0.30    BNP: No components found with this basename: POCBNP,   CBG:  Recent Labs Lab 07/08/13 2343 07/09/13 0804 07/09/13 1216 07/09/13 1625 07/09/13 2242  GLUCAP 221* 168* 179* 183* 323*    Coagulation Studies:  Recent Labs  07/08/13 1648  LABPROT 14.0  INR 1.10     Other results: EKG  ;  Atrial fib with V rate of 117   ( rate is better)   Imaging: Dg Chest 2 View  07/08/2013   CLINICAL DATA:  Short of breath.  Chest pain.  EXAM: CHEST  2 VIEW  COMPARISON:  None.  FINDINGS: There is opacity at the left lung base partly silhouetting the left hemidiaphragm. This is consistent with pneumonia, or other infiltrate, likely with a minimal pleural effusion.  The lungs are otherwise clear. No right pleural effusion. No pneumothorax.  Cardiac silhouette is normal in size. The mediastinum and hilar contours are unremarkable.  IMPRESSION: Left lower lobe infiltrate.   Electronically Signed   By: Amie Portland M.D.   On: 07/08/2013 16:41   Ct Angio Chest Pe W/cm &/or Wo Cm  07/09/2013   CLINICAL DATA:  Decreased oxygen saturation  EXAM: CT ANGIOGRAPHY CHEST WITH CONTRAST  TECHNIQUE: Multidetector CT imaging of the chest was performed using the standard protocol during bolus administration of intravenous contrast. Multiplanar CT image reconstructions including MIPs were obtained  to evaluate the vascular anatomy.  CONTRAST:  OMNIPAQUE IOHEXOL 350 MG/ML SOLN  COMPARISON:  None.  FINDINGS: There are no filling defects in the pulmonary arterial tree to suggest acute pulmonary thromboembolism.  2.0 cm right thyroid nodule.  No abnormal mediastinal adenopathy. Coronary artery calcifications are noted.  Moderate size bilateral pleural effusions. Compressive atelectasis in both lower lobes as well as the posterior upper lobes.  No acute bony deformity.  Review of the MIP images confirms the above findings.  IMPRESSION: No evidence of acute pulmonary thromboembolism.  Moderate bilateral pleural effusions.   Electronically Signed   By: Maryclare Bean M.D.   On: 07/09/2013 14:08      Assessment & Plan:  1. Atrial fibrillation with rapid ventricular response: The patient presents with an episode of atrial fibrillation. She's had atrial fibrillation several times in the past. He was thought to be a poor Coumadin candidate because he has some memory problems.    Given his markedly reduced LV function, will start metoprolol and titrate up as tolerated.  I would favor beta blockers over IV dilt for rate control.  Long term, we will use oral Beta  blockers.  He was on coreg on admission.  2. Acute on Chronic systolic CHF:   We do not know the duration of his LV dysfunction.  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    Troponin levels are negative.  No evidence for MI this admission.  3. Respiratory:  He seems to be improving.  Plans per pccm.   Vesta Mixer, Montez Hageman., MD, [facility] 07/10/2013, 7:36 AM

## 2013-07-10 NOTE — Progress Notes (Signed)
Name: Darius Saunders MRN: [id number] DOB: 01/04/1941    ADMISSION DATE:  07/08/2013 CONSULTATION DATE:  07/10/2013   REFERRING MD :  [facility] PRIMARY SERVICE:  TRH  CHIEF COMPLAINT:   Hypoxemia, chest pain   BRIEF PATIENT DESCRIPTION:  72 y.o.M CAD, CAF, adm with chest pain and dyspnea with cough prod thick yellow.  Pt with CAP LLL and PAF RVR with mild pul edema and hypoxia.  PCCM consulted  SIGNIFICANT EVENTS / STUDIES:  Ct angio chest 12/27   No PE , chf, pleural effusions  LINES / TUBES: PIV  CULTURES: resp viral 12/27 Neg sputu c/s 12/27 Influenza pcr 12/27 NEG  ANTIBIOTICS: Rocephin 12/27>>12/28 azithro 12/27>12/28 levaquin 12/28   SUBJECTIVE:  Less  dyspneic VITAL SIGNS: Temp:  [96.3 F (35.7 C)-98.5 F (36.9 C)] 96.3 F (35.7 C) (12/28 0800) Pulse Rate:  [42-176] 42 (12/28 0800) Resp:  [21-30] 25 (12/28 0800) BP: (121-169)/(80-109) 148/98 mmHg (12/28 0800) SpO2:  [92 %-97 %] 96 % (12/28 0800) FiO2 (%):  [50 %] 50 % (12/27 1511)  PHYSICAL EXAMINATION: General:  No acute distress Neuro:  Non focal HEENT:  ++ jvd   Neck:  Supple  Cardiovascular:  irreg irreg nl s1 s2 no s s4 Lungs:  rales Abdomen:  Soft NT Musculoskeletal:  from Skin:  clear   Recent Labs Lab 07/08/13 1610 07/09/13 0230 07/10/13 0357  NA 135 137 134*  K 4.1 3.6 3.4*  CL 101 105 100  CO2 18* 19 13*  BUN 42* 40* 35*  CREATININE 1.43* 1.28 1.23  GLUCOSE 221* 213* 390*    Recent Labs Lab 07/08/13 1610 07/09/13 0230 07/10/13 0357  HGB 13.2 12.3* 14.0  HCT 37.7* 35.2* 38.9*  WBC 9.2 8.5 11.6*  PLT 207 200 238   Dg Chest 2 View  07/08/2013   CLINICAL DATA:  Short of breath.  Chest pain.  EXAM: CHEST  2 VIEW  COMPARISON:  None.  FINDINGS: There is opacity at the left lung base partly silhouetting the left hemidiaphragm. This is consistent with pneumonia, or other infiltrate, likely with a minimal pleural effusion.  The lungs are otherwise clear. No right pleural  effusion. No pneumothorax.  Cardiac silhouette is normal in size. The mediastinum and hilar contours are unremarkable.  IMPRESSION: Left lower lobe infiltrate.   Electronically Signed   By: Amie Portland M.D.   On: 07/08/2013 16:41   Ct Angio Chest Pe W/cm &/or Wo Cm  07/09/2013   CLINICAL DATA:  Decreased oxygen saturation  EXAM: CT ANGIOGRAPHY CHEST WITH CONTRAST  TECHNIQUE: Multidetector CT imaging of the chest was performed using the standard protocol during bolus administration of intravenous contrast. Multiplanar CT image reconstructions including MIPs were obtained to evaluate the vascular anatomy.  CONTRAST:  OMNIPAQUE IOHEXOL 350 MG/ML SOLN  COMPARISON:  None.  FINDINGS: There are no filling defects in the pulmonary arterial tree to suggest acute pulmonary thromboembolism.  2.0 cm right thyroid nodule.  No abnormal mediastinal adenopathy. Coronary artery calcifications are noted.  Moderate size bilateral pleural effusions. Compressive atelectasis in both lower lobes as well as the posterior upper lobes.  No acute bony deformity.  Review of the MIP images confirms the above findings.  IMPRESSION: No evidence of acute pulmonary thromboembolism.  Moderate bilateral pleural effusions.   Electronically Signed   By: Maryclare Bean M.D.   On: 07/09/2013 14:08   Dg Chest Port 1 View  07/10/2013   CLINICAL DATA:  Pulmonary edema, history of myocardial infarction.  EXAM: PORTABLE CHEST - 1 VIEW  COMPARISON:  PA and lateral chest x-ray dated July 08, 2013  FINDINGS: The lungs are adequately inflated. The interstitial markings have become much more conspicuous since the previous study. The left hemidiaphragm is obscured. The cardiopericardial silhouette is enlarged. The central pulmonary vascularity is engorged.  IMPRESSION: The findings are consistent with congestive heart failure with pulmonary interstitial edema. A small pleural effusion on the left has likely developed.   Electronically Signed   By:  David  [place]   On: 07/10/2013 07:59    ASSESSMENT / PLAN:  #1 Acute hypoxemic resp failure d/t pulm edema, LLL CAP, Afib RVR.   BNP elevated.  Acute systolic heart failure. EF 25% on echo.   No PE on CT angio   Plan   Need records from [facility]   Diurese further     apprec Cards consult   D/c BDs   D/c Steroids   Flutter   IS   Rate control agree BB better than dilt with low ef    viral studies NEG  D/c droplet   D/c roc/azithro   Start levaquin   Titrate oxygen    Ok to tfr to floor tele   PCCM will SIGN OFF , Key is cardiac care     Dorcas Carrow Beeper  [phone]  Cell  [phone]  If no response or cell goes to voicemail, call beeper [phone]  Pulmonary and Critical Care Medicine [facility] Pager: [phone]  07/10/2013, 8:37 AM

## 2013-07-10 NOTE — Progress Notes (Signed)
[facility] ADULT ICU REPLACEMENT PROTOCOL FOR AM LAB REPLACEMENT ONLY  The patient does apply for the [facility] Adult ICU Electrolyte Replacment Protocol based on the criteria listed below:   1. Is GFR >/= 40 ml/min? yes  Patient's GFR today is 57 2. Is urine output >/= 0.5 ml/kg/hr for the last 6 hours? yes Patient's UOP is .58 ml/kg/hr 3. Is BUN < 60 mg/dL? yes  Patient's BUN today is 35 4. Abnormal electrolyte(s): Potassium 5. Ordered repletion with: Potassium per Protocol  Theia Dezeeuw P 07/10/2013 4:55 AM

## 2013-07-10 NOTE — Progress Notes (Addendum)
eLink Physician-Brief Progress Note Patient Name: Darius Saunders DOB: 1941-05-26 MRN: [id number]  Date of Service  07/10/2013   HPI/Events of Note   Tachy on max cardizme, on home ocreg  eICU Interventions  Add IV metoprolol, may need esmolol vs amio Dc hydral   Intervention Category Major Interventions: Arrhythmia - evaluation and management  Joanne Salah J. 07/10/2013, 3:13 AM

## 2013-07-10 NOTE — Progress Notes (Addendum)
[facility] PROGRESS NOTE  Darius Saunders HQI:[id number] DOB: Nov 11, 1940 DOA: 07/08/2013 PCP: Provider Not In System  Brief narrative: 72 y.o. male with past medical history of CAD and stent placements, hypertension, diabetes who presented to [facility] ED 07/08/2013 with worsening shortness of breath and chest pain started about one day prior to this admission. Patient has cough but not report sputum production. Patient reported he has chest pain when he tries to cough. No reports of fevers or chills.  In ED, BP was 103/78, HR 52 - 123, RR 18-31 and oxygen saturation was 89% on the admission. CXR revealed left lower lobe infiltrate. Cardiac enzymes were WNL. The 12 lead EKG was significant for atrial fibrillation for which reason he has required Cardizem drip. Cardiology and PCCM assisting the management. Now on PO metoprolol for rate control, no on anticoagulation due to memory problems.  Assessment/Plan:   Principal Problem:  Acute respiratory failure with hypoxia  - Likely secondary to left lower lobe pneumonia; PE ruled out with negative CT angio chest - respiratory status remains stable on 2 L nasal canula  - appreciate PCCM following and their recommendations  - given solumedrol 60 mg IV Every 6 hours 07/09/2013 but now discontinued per PCCM Active Problems:  CAP (community acquired pneumonia)  - left lower lobe penumonia - started azithromycin and rocephin on the admission but this was changed to PO Levaquin per [facility] 07/10/2013 - blood culture s, strep, influenza all negative; legionella pending  Atrial fibrillation with RVR  - on Cardizem drip initially but per cardiology now on beta blocker. metoprolol - appreciate cardio consult  CAD - on apixiban per cardio - also on lasix 40 mg IV every 6 hours Hypertension - continue metoprolol and losartan  DM2 (diabetes mellitus, type 2)  - hold off on metformin  - on sliding scale insulin  Code Status: full code  Family  Communication: no family at the bedside  Disposition Plan: remains inpatient; transfer to telemetry today   Consultants:  PCCM  Cardiology Procedures:  None  Antibiotics:  Azithromycin 07/08/2013 --> 07/10/2013  Rocephin 07/08/2013 --> 07/10/2013  Levaquin 07/10/2013 -->  Manson Passey, MD  [facility] Pager [phone]  If 7PM-7AM, please contact night-coverage [email or web address] Password [facility] 07/10/2013, 9:47 AM   LOS: 2 days    HPI/Subjective: No acute overnight events.   Objective: Filed Vitals:   07/10/13 0215 07/10/13 0220 07/10/13 0245 07/10/13 0800  BP:    148/98  Pulse: 139  138 42  Temp:    96.3 F (35.7 C)  TempSrc:    Oral  Resp: 23  24 25   Height:      Weight:      SpO2: 97% 96% 97% 96%    Intake/Output Summary (Last 24 hours) at 07/10/13 0947 Last data filed at 07/10/13 0800  Gross per 24 hour  Intake   2145 ml  Output   4975 ml  Net  -2830 ml    Exam:   General:  Pt is alert, follows commands appropriately, not in acute distress  Cardiovascular: bradycardic, (+) S1/S2  Respiratory: Coarse sounds, no wheezing  Abdomen: Soft, non tender, non distended, bowel sounds present, no guarding  Extremities: No edema, pulses DP and PT palpable bilaterally  Neuro: Grossly nonfocal  Data Reviewed: Basic Metabolic Panel:  Recent Labs Lab 07/08/13 1610 07/09/13 0230 07/10/13 0357  NA 135 137 134*  K 4.1 3.6 3.4*  CL 101 105 100  CO2 18* 19 13*  GLUCOSE 221* 213* 390*  BUN 42* 40* 35*  CREATININE 1.43* 1.28 1.23  CALCIUM 9.4 9.0 8.9   Liver Function Tests:  Recent Labs Lab 07/08/13 1648  AST 23  ALT 22  ALKPHOS 91  BILITOT 0.6  PROT 6.5  ALBUMIN 3.4*   No results found for this basename: LIPASE, AMYLASE,  in the last 168 hours No results found for this basename: AMMONIA,  in the last 168 hours CBC:  Recent Labs Lab 07/08/13 1610 07/09/13 0230 07/10/13 0357  WBC 9.2 8.5 11.6*  NEUTROABS  --   --  10.9*  HGB 13.2  12.3* 14.0  HCT 37.7* 35.2* 38.9*  MCV 87.9 88.0 85.3  PLT 207 200 238   Cardiac Enzymes:  Recent Labs Lab 07/08/13 1626 07/08/13 2030 07/09/13 0230 07/09/13 0810  TROPONINI <0.30 <0.30 <0.30 <0.30   BNP: No components found with this basename: POCBNP,  CBG:  Recent Labs Lab 07/09/13 0804 07/09/13 1216 07/09/13 1625 07/09/13 2242 07/10/13 0745  GLUCAP 168* 179* 183* 323* 406*    MRSA PCR SCREENING     Status: None   Collection Time    07/08/13 11:12 PM      Result Value Range Status   MRSA by PCR NEGATIVE  NEGATIVE Final     Studies: Dg Chest 2 View  07/08/2013   CLINICAL DATA:  Short of breath.  Chest pain.  EXAM: CHEST  2 VIEW  COMPARISON:  None.  FINDINGS: There is opacity at the left lung base partly silhouetting the left hemidiaphragm. This is consistent with pneumonia, or other infiltrate, likely with a minimal pleural effusion.  The lungs are otherwise clear. No right pleural effusion. No pneumothorax.  Cardiac silhouette is normal in size. The mediastinum and hilar contours are unremarkable.  IMPRESSION: Left lower lobe infiltrate.   Electronically Signed   By: Amie Portland M.D.   On: 07/08/2013 16:41   Ct Angio Chest Pe W/cm &/or Wo Cm  07/09/2013   CLINICAL DATA:  Decreased oxygen saturation  EXAM: CT ANGIOGRAPHY CHEST WITH CONTRAST  TECHNIQUE: Multidetector CT imaging of the chest was performed using the standard protocol during bolus administration of intravenous contrast. Multiplanar CT image reconstructions including MIPs were obtained to evaluate the vascular anatomy.  CONTRAST:  OMNIPAQUE IOHEXOL 350 MG/ML SOLN  COMPARISON:  None.  FINDINGS: There are no filling defects in the pulmonary arterial tree to suggest acute pulmonary thromboembolism.  2.0 cm right thyroid nodule.  No abnormal mediastinal adenopathy. Coronary artery calcifications are noted.  Moderate size bilateral pleural effusions. Compressive atelectasis in both lower lobes as well as the  posterior upper lobes.  No acute bony deformity.  Review of the MIP images confirms the above findings.  IMPRESSION: No evidence of acute pulmonary thromboembolism.  Moderate bilateral pleural effusions.   Electronically Signed   By: Maryclare Bean M.D.   On: 07/09/2013 14:08   Dg Chest Port 1 View  07/10/2013   CLINICAL DATA:  Pulmonary edema, history of myocardial infarction.  EXAM: PORTABLE CHEST - 1 VIEW  COMPARISON:  PA and lateral chest x-ray dated July 08, 2013  FINDINGS: The lungs are adequately inflated. The interstitial markings have become much more conspicuous since the previous study. The left hemidiaphragm is obscured. The cardiopericardial silhouette is enlarged. The central pulmonary vascularity is engorged.  IMPRESSION: The findings are consistent with congestive heart failure with pulmonary interstitial edema. A small pleural effusion on the left has likely developed.   Electronically Signed   By: David  [place]  On: 07/10/2013 07:59    Scheduled Meds: . apixaban  5 mg Oral BID  . furosemide  40 mg Intravenous Q6H  . insulin aspart  0-20 Units Subcutaneous TID WC  . insulin aspart  0-5 Units Subcutaneous QHS  . levofloxacin  750 mg Oral Daily  . losartan  50 mg Oral Daily  . metoprolol tartrate  25 mg Oral BID   Continuous Infusions: . sodium chloride 20 mL (07/09/13 1759)  . diltiazem (CARDIZEM) infusion 15 mg/hr (07/10/13 0943)

## 2013-07-10 NOTE — Progress Notes (Signed)
Pt confused and pulling IV out, attempting to get out of bed. Unaware of where he is. Sitter with pt, he still attempting to get up and leave. Called family to come help reorient him.

## 2013-07-11 DIAGNOSIS — I2510 Atherosclerotic heart disease of native coronary artery without angina pectoris: Secondary | ICD-10-CM

## 2013-07-11 DIAGNOSIS — I519 Heart disease, unspecified: Secondary | ICD-10-CM

## 2013-07-11 LAB — BASIC METABOLIC PANEL
BUN: 46 mg/dL — ABNORMAL HIGH (ref 6–23)
CO2: 18 mEq/L — ABNORMAL LOW (ref 19–32)
Calcium: 9 mg/dL (ref 8.4–10.5)
Chloride: 103 mEq/L (ref 96–112)
GFR calc non Af Amer: 40 mL/min — ABNORMAL LOW (ref >90)
Glucose, Bld: 262 mg/dL — ABNORMAL HIGH (ref 70–99)
Potassium: 3.5 mEq/L (ref 3.5–5.1)
Sodium: 136 mEq/L (ref 135–145)

## 2013-07-11 LAB — CBC
HCT: 37.6 % — ABNORMAL LOW (ref 39.0–52.0)
Hemoglobin: 12.9 g/dL — ABNORMAL LOW (ref 13.0–17.0)
MCH: 29.9 pg (ref 26.0–34.0)
MCHC: 34.3 g/dL (ref 30.0–36.0)
Platelets: 212 10*3/uL (ref 150–400)
RBC: 4.32 MIL/uL (ref 4.22–5.81)
WBC: 16 10*3/uL — ABNORMAL HIGH (ref 4.0–10.5)

## 2013-07-11 LAB — GLUCOSE, CAPILLARY
Glucose-Capillary: 419 mg/dL — ABNORMAL HIGH (ref 70–99)
Glucose-Capillary: 294 mg/dL — ABNORMAL HIGH (ref 70–99)
Glucose-Capillary: 260 mg/dL — ABNORMAL HIGH (ref 70–99)

## 2013-07-11 MED ORDER — DIGOXIN 125 MCG PO TABS
0.1250 mg | ORAL_TABLET | Freq: Every day | ORAL | Status: DC
  Administered 2013-07-11 – 2013-07-13 (×3): 0.125 mg via ORAL
  Filled 2013-07-11 (×3): qty 1

## 2013-07-11 MED ORDER — INSULIN GLARGINE 100 UNIT/ML ~~LOC~~ SOLN
5.0000 [IU] | Freq: Every day | SUBCUTANEOUS | Status: DC
  Administered 2013-07-11 – 2013-07-13 (×3): 5 [IU] via SUBCUTANEOUS
  Filled 2013-07-11 (×3): qty 0.05

## 2013-07-11 MED ORDER — PERFLUTREN LIPID MICROSPHERE
1.0000 mL | INTRAVENOUS | Status: AC | PRN
  Administered 2013-07-11: 2 mL via INTRAVENOUS
  Filled 2013-07-11 (×2): qty 10

## 2013-07-11 MED ORDER — INSULIN ASPART 100 UNIT/ML ~~LOC~~ SOLN
2.0000 [IU] | Freq: Three times a day (TID) | SUBCUTANEOUS | Status: DC
  Administered 2013-07-11 – 2013-07-13 (×5): 2 [IU] via SUBCUTANEOUS

## 2013-07-11 NOTE — Progress Notes (Signed)
CARE MANAGEMENT NOTE 07/11/2013  Patient:  [facility]   Account Number:  [id number]  Date Initiated:  07/11/2013  Documentation initiated by:  Georgetta Crafton  Subjective/Objective Assessment:   pt with a.fib and iv cardizem drip now have iv digoxin added in,     Action/Plan:   does live alone, family is in the area for support.  is usually indep. in adls   Anticipated DC Date:  07/14/2013   Anticipated DC Plan:  HOME/SELF CARE  In-house referral  NA      DC Planning Services  NA      [facility] Choice  NA   Choice offered to / List presented to:  NA   DME arranged  NA      DME agency  NA     HH arranged  NA      HH agency  NA   Status of service:  In process, will continue to follow Medicare Important Message given?  NA - LOS <3 / Initial given by admissions (If response is "NO", the following Medicare IM given date fields will be blank) Date Medicare IM given:   Date Additional Medicare IM given:    Discharge Disposition:    Per UR Regulation:  Reviewed for med. necessity/level of care/duration of stay  If discussed at Long Length of Stay Meetings, dates discussed:    Comments:  [id number]/Georgana Romain Stark Jock, BSN, [place] [phone] Chart Reviewed for discharge and hospital needs. Discharge needs at time of review:  None present will follow for needs. Review of patient progress due on [id number].

## 2013-07-11 NOTE — Progress Notes (Addendum)
Patient Name: Darius Saunders Date of Encounter: 07/11/2013  Principal Problem:   Acute systolic congestive heart failure, NYHA class 3 Active Problems:   CAP (community acquired pneumonia)   Atrial fibrillation with RVR   DM2 (diabetes mellitus, type 2)   Acute respiratory failure with hypoxia   CAD (coronary artery disease)   Ischemic cardiomyopathy   Length of Stay: 3  SUBJECTIVE  The patient states that he feels better and his breathing has improved. However he continues being tachycardic overnight with HR 110' that increased to 130-150 this am.  CURRENT MEDS . apixaban  5 mg Oral BID  . insulin aspart  0-20 Units Subcutaneous TID WC  . insulin aspart  0-5 Units Subcutaneous QHS  . levofloxacin  750 mg Oral Daily  . losartan  50 mg Oral Daily  . metoprolol tartrate  25 mg Oral BID   . sodium chloride 20 mL (07/09/13 1759)  . diltiazem (CARDIZEM) infusion 15 mg/hr (07/10/13 0943)    OBJECTIVE  Filed Vitals:   07/11/13 0000 07/11/13 0017 07/11/13 0400 07/11/13 0416  BP:  119/66  110/66  Pulse: 124 56 121 116  Temp: 99.3 F (37.4 C)  97.6 F (36.4 C)   TempSrc: Oral  Oral   Resp: 16 16 19 17   Height:      Weight:   206 lb 9.1 oz (93.7 kg)   SpO2: 93% 93% 96% 94%    Intake/Output Summary (Last 24 hours) at 07/11/13 0739 Last data filed at 07/11/13 0716  Gross per 24 hour  Intake   1220 ml  Output   2050 ml  Net   -830 ml   Filed Weights   07/08/13 1657 07/09/13 0400 07/11/13 0400  Weight: 200 lb (90.719 kg) 208 lb 5.4 oz (94.5 kg) 206 lb 9.1 oz (93.7 kg)    PHYSICAL EXAM  General: Pleasant, NAD. Neuro: Alert and oriented X 3. Moves all extremities spontaneously. Psych: Normal affect. HEENT:  Normal  Neck: Supple without bruits or JVD. Lungs:  Resp regular and unlabored, crackles bibasilary Heart: RRR no s3, s4, or murmurs. Abdomen: Soft, non-tender, non-distended, BS + x 4.  Extremities: No clubbing, cyanosis or edema. DP/PT/Radials 2+ and  equal bilaterally.  Accessory Clinical Findings  CBC  Recent Labs  07/09/13 0230 07/10/13 0357 07/11/13 0327  WBC 8.5 11.6* 16.0*  NEUTROABS  --  10.9*  --   HGB 12.3* 14.0 12.9*  HCT 35.2* 38.9* 37.6*  MCV 88.0 85.3 87.0  PLT 200 238 212   Basic Metabolic Panel  Recent Labs  07/10/13 0357 07/11/13 0327  NA 134* 136  K 3.4* 3.5  CL 100 103  CO2 13* 18*  GLUCOSE 390* 262*  BUN 35* 46*  CREATININE 1.23 1.64*  CALCIUM 8.9 9.0   Liver Function Tests  Recent Labs  07/08/13 1648  AST 23  ALT 22  ALKPHOS 91  BILITOT 0.6  PROT 6.5  ALBUMIN 3.4*   Cardiac Enzymes  Recent Labs  07/08/13 2030 07/09/13 0230 07/09/13 0810  TROPONINI <0.30 <0.30 <0.30   Radiology/Studies  Ct Angio Chest Pe W/cm &/or Wo Cm  07/09/2013   IMPRESSION: No evidence of acute pulmonary thromboembolism.  Moderate bilateral pleural effusions.   Electronically Signed   By: Maryclare Bean M.D.   On: 07/09/2013 14:08   Dg Chest Port 1 View  07/10/2013  IMPRESSION: The findings are consistent with congestive heart failure with pulmonary interstitial edema. A small pleural effusion on the left  has likely developed.   Electronically Signed   By: David  [place]   On: 07/10/2013 07:59   TELE  A fib with RVR 105-150 BPM  Echocardiogram 07/09/2013 Study Conclusions  - Left ventricle: The cavity size was normal. There was mild focal basal hypertrophy of the septum. Systolic function was severely reduced. The estimated ejection fraction was in the range of 20% to 25%. Diffuse hypokinesis. There is akinesis of the basal-midanteroseptal myocardium. Doppler parameters are consistent with abnormal left ventricular relaxation (grade 1 diastolic dysfunction). Cannot exclude apical thrombus. - Mitral valve: Mild regurgitation.    ASSESSMENT AND PLAN  Patient is a 72 y.o. male with a PMHx of CAD, (LAD- 3.0 x 18 mm Xience Xpedition and 2.75 x 8 mm Xience Xpedition, RCA - 2.5x15 Xience Xpedition,  08/29/2011) , HTN, DM who was admitted to [facility] on 07/08/2013 for evaluation of pneumonia and worsening dyspnea. He was found to have a-fib.  1. Atrial fibrillation with rapid ventricular response: The patient presents with an episode of atrial fibrillation. He's had atrial fibrillation several times in the past (possible chronic persistent, now RVR due to underlying pneumonia). He was thought to be a poor Coumadin candidate because he has some memory problems. Currently on Apixaban (low EF, possible apical thrombus). We will order an echocardiogram with contrast to evaluate for possible apical contrast.  We will schedule him for a TEE/cardioversion tomorrow if we are unable to control his rate. At this point we will add digoxin 0.125 mg po daily in addition to Diltiazem drip, PO metoprolol and iv metoprolol PRN.  Long term, we will use oral Beta blockers (low LVEF). He was on coreg on admission.   2. Acute on Chronic systolic CHF: We do not know the duration of his LV dysfunction. 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 Troponin levels are negative. No evidence for MI this admission. 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   3. Respiratory: Pneumonia, increasing WBC. On PO ATB.   Signed, Tobias Alexander, H MD, [facility] 07/11/2013

## 2013-07-11 NOTE — Progress Notes (Addendum)
[facility] PROGRESS NOTE  Darius Saunders UJW:[id number] DOB: 1940/10/10 DOA: 07/08/2013 PCP: Provider Not In System  Brief narrative: 72 y.o. male with past medical history of CAD and stent placements, hypertension, diabetes who presented to [facility] ED 07/08/2013 with worsening shortness of breath and chest pain started about one day prior to this admission. Patient has cough but not report sputum production. Patient reported he has chest pain when he tries to cough. No reports of fevers or chills.  In ED, BP was 103/78, HR 52 - 123, RR 18-31 and oxygen saturation was 89% on the admission. CXR revealed left lower lobe infiltrate. Cardiac enzymes were WNL. The 12 lead EKG was significant for atrial fibrillation for which reason he has required Cardizem drip. Was switched to PO but then HR uncontrolled. Restarted on Cardizem drip and digoxin but if unable to control HR may require cardioversion.   Assessment/Plan:   Principal Problem:  Acute respiratory failure with hypoxia  - Likely secondary to left lower lobe pneumonia; PE ruled out with negative CT angio chest  - respiratory status remains stable on 2 L nasal canula  - given solumedrol 60 mg IV Every 6 hours 07/09/2013 but now discontinued per PCCM  - continue Levaquin daily by mouth Active Problems:  CAP (community acquired pneumonia)  - left lower lobe penumonia  - started azithromycin and rocephin on the admission but this was changed to PO Levaquin per [facility] 07/10/2013  - blood cultures, strep, influenza all negative; legionella negative Atrial fibrillation with RVR  - on Cardizem drip initially but per cardiology switched to beta blocker. Metoprolol; HR then went up as high as 150's so back on Cardizem drip - also started digoxin per cardio - appreciate cardio consult  CAD  - on apixiban per cardio  - was also on lasix 40 mg IV every 6 hours but this is now discontinued (d/c'ed 07/10/2013) Hypertension  - continue metoprolol  and losartan  DM2 (diabetes mellitus, type 2)  - hold off on metformin  - on sliding scale insulin  - added low dose Lantus 5 units daily and TID AC insulin 2 units  Code Status: full code  Family Communication: no family at the bedside  Disposition Plan: remains inpatient; transfer to telemetry once bed available  Consultants:  PCCM  Cardiology Procedures:  None  Antibiotics:  Azithromycin 07/08/2013 --> 07/10/2013  Rocephin 07/08/2013 --> 07/10/2013  Levaquin 07/10/2013 -->   Manson Passey, MD  [facility] Pager [phone]  If 7PM-7AM, please contact night-coverage [email or web address] Password [email or web address] 07/11/2013, 1:58 PM   LOS: 3 days    HPI/Subjective: No overnight events.   Objective: Filed Vitals:   07/11/13 0800 07/11/13 1000 07/11/13 1151 07/11/13 1200  BP:   105/57   Pulse: 41 50 95 94  Temp: 97.5 F (36.4 C)     TempSrc: Oral     Resp: 28 20 20 19   Height:      Weight:      SpO2: 96% 95% 97% 96%    Intake/Output Summary (Last 24 hours) at 07/11/13 1358 Last data filed at 07/11/13 1200  Gross per 24 hour  Intake 1351.09 ml  Output    900 ml  Net 451.09 ml    Exam:   General:  Pt is alert, follows commands appropriately, not in acute distress  Cardiovascular: irregular rhythm, tachycardic, S1/S2 appreciated   Respiratory: Clear to auscultation bilaterally, no wheezing, no crackles, no rhonchi  Abdomen: Soft, non tender, non distended, bowel sounds present,  no guarding  Extremities: No edema, pulses DP and PT palpable bilaterally  Neuro: Grossly nonfocal  Data Reviewed: Basic Metabolic Panel:  Recent Labs Lab 07/08/13 1610 07/09/13 0230 07/10/13 0357 07/11/13 0327  NA 135 137 134* 136  K 4.1 3.6 3.4* 3.5  CL 101 105 100 103  CO2 18* 19 13* 18*  GLUCOSE 221* 213* 390* 262*  BUN 42* 40* 35* 46*  CREATININE 1.43* 1.28 1.23 1.64*  CALCIUM 9.4 9.0 8.9 9.0   Liver Function Tests:  Recent Labs Lab 07/08/13 1648  AST 23  ALT 22   ALKPHOS 91  BILITOT 0.6  PROT 6.5  ALBUMIN 3.4*   No results found for this basename: LIPASE, AMYLASE,  in the last 168 hours No results found for this basename: AMMONIA,  in the last 168 hours CBC:  Recent Labs Lab 07/08/13 1610 07/09/13 0230 07/10/13 0357 07/11/13 0327  WBC 9.2 8.5 11.6* 16.0*  NEUTROABS  --   --  10.9*  --   HGB 13.2 12.3* 14.0 12.9*  HCT 37.7* 35.2* 38.9* 37.6*  MCV 87.9 88.0 85.3 87.0  PLT 207 200 238 212   Cardiac Enzymes:  Recent Labs Lab 07/08/13 1626 07/08/13 2030 07/09/13 0230 07/09/13 0810  TROPONINI <0.30 <0.30 <0.30 <0.30   BNP: No components found with this basename: POCBNP,  CBG:  Recent Labs Lab 07/10/13 1204 07/10/13 1614 07/10/13 2200 07/11/13 0853 07/11/13 1228  GLUCAP 187* 148* 220* 419* 294*    MRSA PCR SCREENING     Status: None   Collection Time    07/08/13 11:12 PM      Result Value Range Status   MRSA by PCR NEGATIVE  NEGATIVE Final  CULTURE, BLOOD (ROUTINE X 2)     Status: None   Collection Time    07/09/13 10:16 AM      Result Value Range Status   Specimen Description BLOOD RIGHT HAND  5.5 ML IN [facility] BOTTLE   Final   Value:        BLOOD CULTURE RECEIVED NO GROWTH TO DATE     Performed at [facility]   Report Status PENDING   Incomplete  CULTURE, BLOOD (ROUTINE X 2)     Status: None   Collection Time    07/09/13 10:40 AM      Result Value Range Status   Specimen Description BLOOD RIGHT HAND  5.5 ML IN [facility] BOTTLE   Final   Value:        BLOOD CULTURE RECEIVED NO GROWTH TO DATE      Performed at [facility]   Report Status PENDING   Incomplete  RESPIRATORY VIRUS PANEL     Status: None   Collection Time    07/09/13 10:49 AM      Result Value Range Status   Source - RVPAN NOSE   Final   Respiratory Syncytial Virus A NOT DETECTED   Final   Respiratory Syncytial Virus B NOT DETECTED   Final   Influenza A NOT DETECTED   Final   Influenza B NOT DETECTED   Final   Parainfluenza 1 NOT  DETECTED   Final   Parainfluenza 2 NOT DETECTED   Final   Parainfluenza 3 NOT DETECTED   Final   Metapneumovirus NOT DETECTED   Final   Rhinovirus NOT DETECTED   Final   Adenovirus NOT DETECTED   Final   Influenza A H1 NOT DETECTED   Final   Influenza A H3 NOT  DETECTED   Final     Studies: Dg Chest Port 1 View 07/10/2013    IMPRESSION: The findings are consistent with congestive heart failure with pulmonary interstitial edema. A small pleural effusion on the left has likely developed.   Electronically Signed   By: David  [place]   On: 07/10/2013 07:59    Scheduled Meds: . apixaban  5 mg Oral BID  . digoxin  0.125 mg Oral Daily  . insulin aspart  0-20 Units Subcutaneous TID WC  . insulin aspart  0-5 Units Subcutaneous QHS  . insulin aspart  2 Units Subcutaneous TID WC  . insulin glargine  5 Units Subcutaneous Daily  . levofloxacin  750 mg Oral Daily  . losartan  50 mg Oral Daily  . metoprolol tartrate  25 mg Oral BID   Continuous Infusions: . diltiazem (CARDIZEM) infusion 5 mg/hr (07/11/13 1152)

## 2013-07-11 NOTE — Progress Notes (Signed)
  Echocardiogram 2D Echocardiogram limited with Definity has been performed.  Darius Saunders 07/11/2013, 1:49 PM

## 2013-07-12 LAB — GLUCOSE, CAPILLARY
Glucose-Capillary: 206 mg/dL — ABNORMAL HIGH (ref 70–99)
Glucose-Capillary: 249 mg/dL — ABNORMAL HIGH (ref 70–99)
Glucose-Capillary: 192 mg/dL — ABNORMAL HIGH (ref 70–99)
Glucose-Capillary: 187 mg/dL — ABNORMAL HIGH (ref 70–99)

## 2013-07-12 MED ORDER — DILTIAZEM HCL ER 90 MG PO CP12: 90.0000 mg | ORAL_CAPSULE | Freq: Two times a day (BID) | ORAL | Status: DC

## 2013-07-12 MED ORDER — METOPROLOL TARTRATE 100 MG PO TABS
100.0000 mg | ORAL_TABLET | Freq: Two times a day (BID) | ORAL | Status: DC
  Administered 2013-07-12 – 2013-07-13 (×3): 100 mg via ORAL
  Filled 2013-07-12 (×4): qty 1

## 2013-07-12 MED ORDER — METFORMIN HCL 500 MG PO TABS: 1000.0000 mg | ORAL_TABLET | Freq: Two times a day (BID) | ORAL | Status: DC

## 2013-07-12 MED ORDER — FOLIC ACID 1 MG PO TABS
1.0000 mg | ORAL_TABLET | Freq: Every day | ORAL | Status: DC
  Administered 2013-07-12 – 2013-07-13 (×2): 1 mg via ORAL
  Filled 2013-07-12 (×2): qty 1

## 2013-07-12 NOTE — Progress Notes (Signed)
Inpatient Diabetes Program Recommendations  AACE/ADA: New Consensus Statement on Inpatient Glycemic Control (2013)  Target Ranges:  Prepandial:   less than 140 mg/dL      Peak postprandial:   less than 180 mg/dL (1-2 hours)      Critically ill patients:  140 - 180 mg/dL     Results for MANSUR, PATTI (MRN [id number]) as of 07/12/2013 14:54  Ref. Range 07/11/2013 08:53 07/11/2013 12:28 07/11/2013 15:58 07/11/2013 21:55  Glucose-Capillary Latest Range: 70-99 mg/dL 469 (H) 629 (H) 528 (H) 181 (H)    Results for HYKEEM, OJEDA (MRN [id number]) as of 07/12/2013 14:54  Ref. Range 07/12/2013 08:04 07/12/2013 12:05  Glucose-Capillary Latest Range: 70-99 mg/dL 272 (H) 536 (H)    **Patient admitted with CHF.  Has history of Type 2 DM.  Per records, patient takes Metformin 1000 mg bid at home.  **Noted steroids stopped on 12/28.  Patient still having hyperglycemia despite Lantus 5 units daily, Novolog 2 units tid with meals, and Novolog Resistant SSI.   **MD-Please consider the following in-hospital insulin adjustments:  1. Increase Lantus to 10 units daily in the AM 2. Increase Novolog meal coverage to 4 units tid with meals   Will follow. Ambrose Finland RN, MSN, CDE Diabetes Coordinator Inpatient Diabetes Program Team Pager: [phone] (8a-10p)

## 2013-07-12 NOTE — Progress Notes (Addendum)
[facility] PROGRESS NOTE  Darius Saunders WUJ:[id number] DOB: 05-Sep-1940 DOA: 07/08/2013 PCP: Provider Not In System  Brief narrative: 72 y.o. male with past medical history of CAD and stent placements, hypertension, diabetes who presented to [facility] ED 07/08/2013 with worsening shortness of breath and chest pain started about one day prior to this admission. Patient has cough but not report sputum production. Patient reported he has chest pain when he tries to cough. No reports of fevers or chills.  In ED, BP was 103/78, HR 52 - 123, RR 18-31 and oxygen saturation was 89% on the admission. CXR revealed left lower lobe infiltrate. Cardiac enzymes were WNL. The 12 lead EKG was significant for atrial fibrillation for which reason he has required Cardizem drip. Per cariology, he is now on PO beta blocker.   Assessment/Plan:   Principal Problem:  Acute respiratory failure with hypoxia  - Likely secondary to left lower lobe pneumonia; PE ruled out with negative CT angio chest  - respiratory status remains stable and he is off of nasal canula - given solumedrol 60 mg IV Every 6 hours 07/09/2013 but now discontinued per PCCM  - continue Levaquin daily by mouth  Active Problems:  CAP (community acquired pneumonia)  - left lower lobe penumonia based on CXR - started azithromycin and rocephin on the admission but this was changed to PO Levaquin per [facility] 07/10/2013  - blood cultures, strep, influenza all negative; legionella negative  Atrial fibrillation with RVR  - on Cardizem drip initially but per cardiology switched to beta blocker. Metoprolol; HR then went up as high as 150's so back on Cardizem drip and then to PO again started 07/12/2013 - continue apixaban and digoxin - appreciate cardiology following CAD  - continue digoxin 0.125 mg PO daily - on apixaban as well - he was also on lasix 40 mg IV every 6 hours but this is now discontinued (d/c'ed 07/10/2013)  Hypertension  - continue  metoprolol and losartan  DM2 (diabetes mellitus, type 2)  - will hold metformin - CBG's in past 24 hours: 260, 181 and 206 - continue Lantus 5 units daily and Novolg 2 units TID - continue sliding scale insulin  Acute renal failure - likely due to lasix - continue to monitor - off of lasix  Code Status: full code  Family Communication: no family at the bedside  Disposition Plan: transfer to telemetry when bed available   Consultants:  PCCM  Cardiology Procedures:  None  Antibiotics:  Azithromycin 07/08/2013 --> 07/10/2013  Rocephin 07/08/2013 --> 07/10/2013  Levaquin 07/10/2013 -->  Manson Passey, MD  [facility] Pager [phone]  If 7PM-7AM, please contact night-coverage [email or web address] Password [email or web address] 07/12/2013, 10:24 AM   LOS: 4 days    HPI/Subjective: No overnight events. Feels better this am.   Objective: Filed Vitals:   07/11/13 2241 07/12/13 0000 07/12/13 0400 07/12/13 0613  BP: 138/95 116/89  132/84  Pulse: 98 46 97 100  Temp:  98.4 F (36.9 C) 97.4 F (36.3 C)   TempSrc:  Oral Oral   Resp:   15 17  Height:      Weight:      SpO2:  95% 92% 87%    Intake/Output Summary (Last 24 hours) at 07/12/13 1024 Last data filed at 07/12/13 0900  Gross per 24 hour  Intake 644.34 ml  Output   1050 ml  Net -405.66 ml    Exam:   General:  Pt is alert, follows commands appropriately, not in acute distress  Cardiovascular: tachycardic, S1/S2 appreciated   Respiratory: Clear to auscultation bilaterally, no wheezing, no crackles, no rhonchi  Abdomen: Soft, non tender, non distended, bowel sounds present, no guarding  Extremities: No edema, pulses DP and PT palpable bilaterally  Neuro: Grossly nonfocal  Data Reviewed: Basic Metabolic Panel:  Recent Labs Lab 07/08/13 1610 07/09/13 0230 07/10/13 0357 07/11/13 0327  NA 135 137 134* 136  K 4.1 3.6 3.4* 3.5  CL 101 105 100 103  CO2 18* 19 13* 18*  GLUCOSE 221* 213* 390* 262*  BUN 42* 40* 35*  46*  CREATININE 1.43* 1.28 1.23 1.64*  CALCIUM 9.4 9.0 8.9 9.0   Liver Function Tests:  Recent Labs Lab 07/08/13 1648  AST 23  ALT 22  ALKPHOS 91  BILITOT 0.6  PROT 6.5  ALBUMIN 3.4*   No results found for this basename: LIPASE, AMYLASE,  in the last 168 hours No results found for this basename: AMMONIA,  in the last 168 hours CBC:  Recent Labs Lab 07/08/13 1610 07/09/13 0230 07/10/13 0357 07/11/13 0327  WBC 9.2 8.5 11.6* 16.0*  NEUTROABS  --   --  10.9*  --   HGB 13.2 12.3* 14.0 12.9*  HCT 37.7* 35.2* 38.9* 37.6*  MCV 87.9 88.0 85.3 87.0  PLT 207 200 238 212   Cardiac Enzymes:  Recent Labs Lab 07/08/13 1626 07/08/13 2030 07/09/13 0230 07/09/13 0810  TROPONINI <0.30 <0.30 <0.30 <0.30   BNP: No components found with this basename: POCBNP,  CBG:  Recent Labs Lab 07/11/13 0853 07/11/13 1228 07/11/13 1558 07/11/13 2155 07/12/13 0804  GLUCAP 419* 294* 260* 181* 206*    MRSA PCR SCREENING     Status: None   Collection Time    07/08/13 11:12 PM      Result Value Range Status   MRSA by PCR NEGATIVE  NEGATIVE Final  CULTURE, BLOOD (ROUTINE X 2)     Status: None   Collection Time    07/09/13 10:16 AM      Result Value Range Status   Specimen Description BLOOD RIGHT HAND  5.5 ML IN [facility] BOTTLE   Final   Value:        BLOOD CULTURE RECEIVED NO GROWTH TO DATE      Performed at [facility]   Report Status PENDING   Incomplete  CULTURE, BLOOD (ROUTINE X 2)     Status: None   Collection Time    07/09/13 10:40 AM      Result Value Range Status   Specimen Description BLOOD RIGHT HAND  5.5 ML IN [facility] BOTTLE   Final   Value:        BLOOD CULTURE RECEIVED NO GROWTH TO DATE      Performed at [facility]   Report Status PENDING   Incomplete  RESPIRATORY VIRUS PANEL     Status: None   Collection Time    07/09/13 10:49 AM      Result Value Range Status   Source - RVPAN NOSE   Final   Respiratory Syncytial Virus A NOT DETECTED   Final    Respiratory Syncytial Virus B NOT DETECTED   Final   Influenza A NOT DETECTED   Final   Influenza B NOT DETECTED   Final   Parainfluenza 1 NOT DETECTED   Final   Parainfluenza 2 NOT DETECTED   Final   Parainfluenza 3 NOT DETECTED   Final   Metapneumovirus NOT DETECTED   Final   Rhinovirus NOT  DETECTED   Final   Adenovirus NOT DETECTED   Final   Influenza A H1 NOT DETECTED   Final   Influenza A H3 NOT DETECTED   Final     Studies: No results found.  Scheduled Meds: . apixaban  5 mg Oral BID  . digoxin  0.125 mg Oral Daily  . insulin aspart  0-20 Units Subcutaneous TID WC  . insulin aspart  0-5 Units Subcutaneous QHS  . insulin aspart  2 Units Subcutaneous TID WC  . insulin glargine  5 Units Subcutaneous Daily  . levofloxacin  750 mg Oral Daily  . losartan  50 mg Oral Daily  . metoprolol tartrate  100 mg Oral BID   Continuous Infusions: . sodium chloride 10 mL/hr at 07/11/13 [facility]

## 2013-07-12 NOTE — Progress Notes (Signed)
Patient Name: Darius Saunders Date of Encounter: 07/12/2013  Principal Problem:   Acute systolic congestive heart failure, NYHA class 3 Active Problems:   CAP (community acquired pneumonia)   Atrial fibrillation with RVR   DM2 (diabetes mellitus, type 2)   Acute respiratory failure with hypoxia   CAD (coronary artery disease)   Ischemic cardiomyopathy   Length of Stay: 4  SUBJECTIVE  The patient states that he feels well. Tele shows rates better controlled.    CURRENT MEDS . apixaban  5 mg Oral BID  . digoxin  0.125 mg Oral Daily  . insulin aspart  0-20 Units Subcutaneous TID WC  . insulin aspart  0-5 Units Subcutaneous QHS  . insulin aspart  2 Units Subcutaneous TID WC  . insulin glargine  5 Units Subcutaneous Daily  . levofloxacin  750 mg Oral Daily  . losartan  50 mg Oral Daily  . metoprolol tartrate  25 mg Oral BID   . sodium chloride 10 mL/hr at 07/11/13 0818  . diltiazem (CARDIZEM) infusion 5 mg/hr (07/11/13 1954)   OBJECTIVE  Filed Vitals:   07/11/13 2241 07/12/13 0000 07/12/13 0400 07/12/13 0613  BP: 138/95 116/89  132/84  Pulse: 98 46 97 100  Temp:  98.4 F (36.9 C) 97.4 F (36.3 C)   TempSrc:  Oral Oral   Resp:   15 17  Height:      Weight:      SpO2:  95% 92% 87%    Intake/Output Summary (Last 24 hours) at 07/12/13 0732 Last data filed at 07/12/13 0700  Gross per 24 hour  Intake 1411.09 ml  Output    900 ml  Net 511.09 ml   Filed Weights   07/08/13 1657 07/09/13 0400 07/11/13 0400  Weight: 200 lb (90.719 kg) 208 lb 5.4 oz (94.5 kg) 206 lb 9.1 oz (93.7 kg)    PHYSICAL EXAM  General: Pleasant, NAD. Neuro: Alert and oriented X 3. Moves all extremities spontaneously. Psych: Normal affect. HEENT:  Normal  Neck: Supple without bruits or JVD. Lungs:  Resp regular and unlabored, crackles bibasilary Heart: RRR no s3, s4, or murmurs. Abdomen: Soft, non-tender, non-distended, BS + x 4.  Extremities: No clubbing, cyanosis or edema.  DP/PT/Radials 2+ and equal bilaterally.  Accessory Clinical Findings  CBC  Recent Labs  07/10/13 0357 07/11/13 0327  WBC 11.6* 16.0*  NEUTROABS 10.9*  --   HGB 14.0 12.9*  HCT 38.9* 37.6*  MCV 85.3 87.0  PLT 238 212   Basic Metabolic Panel  Recent Labs  07/10/13 0357 07/11/13 0327  NA 134* 136  K 3.4* 3.5  CL 100 103  CO2 13* 18*  GLUCOSE 390* 262*  BUN 35* 46*  CREATININE 1.23 1.64*  CALCIUM 8.9 9.0   Liver Function Tests No results found for this basename: AST, ALT, ALKPHOS, BILITOT, PROT, ALBUMIN,  in the last 72 hours Cardiac Enzymes  Recent Labs  07/09/13 0810  TROPONINI <0.30   Radiology/Studies  Ct Angio Chest Pe W/cm &/or Wo Cm  07/09/2013   IMPRESSION: No evidence of acute pulmonary thromboembolism.  Moderate bilateral pleural effusions.   Electronically Signed   By: Maryclare Bean M.D.   On: 07/09/2013 14:08   Dg Chest Port 1 View  07/10/2013  IMPRESSION: The findings are consistent with congestive heart failure with pulmonary interstitial edema. A small pleural effusion on the left has likely developed.   Electronically Signed   By: David  [place]   On: 07/10/2013 07:59  TELE  A fib with ventricular rates 95-105  BPM  Echocardiogram 07/09/2013 Study Conclusions  - Left ventricle: The cavity size was normal. There was mild focal basal hypertrophy of the septum. Systolic function was severely reduced. The estimated ejection fraction was in the range of 20% to 25%. Diffuse hypokinesis. There is akinesis of the basal-midanteroseptal myocardium. Doppler parameters are consistent with abnormal left ventricular relaxation (grade 1 diastolic dysfunction). Cannot exclude apical thrombus. - Mitral valve: Mild regurgitation.  TTE 07/11/2013 Left ventricle: Systolic function was severely reduced. The estimated ejection fraction was in the range of 20% to 25%. Diffuse hypokinesis. There is akinesis of the basal and mid anteroseptal myocardium. Doppler  parameters are consistent with abnormal left ventricular relaxation (grade 1 diastolic dysfunction). There is no apical thrombus. on images with echocontrast. The cavity size was normal. Systolic function was normal. Wall motion was normal; there were no regional wall motion abnormalities.    ASSESSMENT AND PLAN  Patient is a 72 y.o. male with a PMHx of CAD, (LAD- 3.0 x 18 mm Xience Xpedition and 2.75 x 8 mm Xience Xpedition, RCA - 2.5x15 Xience Xpedition, 08/29/2011) , HTN, DM who was admitted to [facility] on 07/08/2013 for evaluation of pneumonia and worsening dyspnea. He was found to have a-fib.  1. Atrial fibrillation with rapid ventricular response: The patient presents with an episode of atrial fibrillation. He's had atrial fibrillation several times in the past (possible chronic persistent, now RVR due to underlying pneumonia). He was thought to be a poor Coumadin candidate because he has some memory problems. Currently on Apixaban (low EF, possible apical thrombus). Echocardiogram showed severely impaired LVEF 20-25%, no apical thrombus. Ventricular rates better controlled after restarting cardizem drip, we will convert to PO betablockers today, Metoprolol 100 mg PO BID.  Continue digoxin 0.125 mg po daily.  2. Acute on Chronic systolic CHF: We do not know the duration of his LV dysfunction. 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 Troponin levels are negative. No evidence for MI this admission. 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   3. Respiratory: Pneumonia, increasing WBC. On PO ATB.   Signed, Tobias Alexander, H MD, [facility] 07/12/2013

## 2013-07-13 DIAGNOSIS — I2589 Other forms of chronic ischemic heart disease: Secondary | ICD-10-CM

## 2013-07-13 LAB — BASIC METABOLIC PANEL
Calcium: 8.5 mg/dL (ref 8.4–10.5)
Creatinine, Ser: 1.16 mg/dL (ref 0.50–1.35)
GFR calc Af Amer: 71 mL/min — ABNORMAL LOW (ref >90)
GFR calc non Af Amer: 61 mL/min — ABNORMAL LOW (ref >90)
Glucose, Bld: 171 mg/dL — ABNORMAL HIGH (ref 70–99)
Potassium: 3.8 mEq/L (ref 3.7–5.3)

## 2013-07-13 MED ORDER — APIXABAN 5 MG PO TABS: 5.0000 mg | ORAL_TABLET | Freq: Two times a day (BID) | ORAL | Status: DC

## 2013-07-13 MED ORDER — METOPROLOL TARTRATE 100 MG PO TABS: 100.0000 mg | ORAL_TABLET | Freq: Two times a day (BID) | ORAL | Status: DC

## 2013-07-13 MED ORDER — DIGOXIN 125 MCG PO TABS: 0.1250 mg | ORAL_TABLET | Freq: Every day | ORAL | Status: AC

## 2013-07-13 MED ORDER — LEVOFLOXACIN 750 MG PO TABS: 750.0000 mg | ORAL_TABLET | Freq: Every day | ORAL | Status: DC

## 2013-07-13 NOTE — Progress Notes (Addendum)
Patient Name: Darius Saunders Date of Encounter: 07/13/2013  Principal Problem:   Acute systolic congestive heart failure, NYHA class 3 Active Problems:   CAP (community acquired pneumonia)   Atrial fibrillation with RVR   DM2 (diabetes mellitus, type 2)   Acute respiratory failure with hypoxia   CAD (coronary artery disease)   Ischemic cardiomyopathy   Length of Stay: 5  SUBJECTIVE  The patient feels well. Tele shows rates were controlled in the last 24 hours but increased this morning.    CURRENT MEDS . apixaban  5 mg Oral BID  . digoxin  0.125 mg Oral Daily  . folic acid  1 mg Oral Daily  . insulin aspart  0-20 Units Subcutaneous TID WC  . insulin aspart  0-5 Units Subcutaneous QHS  . insulin aspart  2 Units Subcutaneous TID WC  . insulin glargine  5 Units Subcutaneous Daily  . levofloxacin  750 mg Oral Daily  . losartan  50 mg Oral Daily  . metoprolol tartrate  100 mg Oral BID   . sodium chloride 10 mL/hr at 07/11/13 0818   OBJECTIVE  Filed Vitals:   07/12/13 1026 07/12/13 1200 07/12/13 2040 07/13/13 0510  BP: 135/71 112/71 120/77 128/86  Pulse:   76 99  Temp:  97.5 F (36.4 C) 97.4 F (36.3 C) 97.9 F (36.6 C)  TempSrc:  Oral Axillary Oral  Resp: 19 24 20 18   Height:      Weight:      SpO2:   100% 96%    Intake/Output Summary (Last 24 hours) at 07/13/13 0806 Last data filed at 07/13/13 0804  Gross per 24 hour  Intake    270 ml  Output    800 ml  Net   -530 ml   Filed Weights   07/08/13 1657 07/09/13 0400 07/11/13 0400  Weight: 200 lb (90.719 kg) 208 lb 5.4 oz (94.5 kg) 206 lb 9.1 oz (93.7 kg)    PHYSICAL EXAM  General: Pleasant, NAD. Neuro: Alert and oriented X 3. Moves all extremities spontaneously. Psych: Normal affect. HEENT:  Normal  Neck: Supple without bruits or JVD. Lungs:  Resp regular and unlabored, minimal crackles Heart: RRR no s3, s4, or murmurs. Abdomen: Soft, non-tender, non-distended, BS + x 4.  Extremities: No  clubbing, cyanosis or edema. DP/PT/Radials 2+ and equal bilaterally.  Accessory Clinical Findings  CBC  Recent Labs  07/11/13 0327  WBC 16.0*  HGB 12.9*  HCT 37.6*  MCV 87.0  PLT 212   Basic Metabolic Panel  Recent Labs  07/11/13 0327 07/13/13 0628  NA 136 138  K 3.5 3.8  CL 103 105  CO2 18* 20  GLUCOSE 262* 171*  BUN 46* 38*  CREATININE 1.64* 1.16  CALCIUM 9.0 8.5   Ct Angio Chest Pe W/cm &/or Wo Cm  07/09/2013   IMPRESSION: No evidence of acute pulmonary thromboembolism.  Moderate bilateral pleural effusions.   Electronically Signed   By: Maryclare Bean M.D.   On: 07/09/2013 14:08   Dg Chest Port 1 View  07/10/2013  IMPRESSION: The findings are consistent with congestive heart failure with pulmonary interstitial edema. A small pleural effusion on the left has likely developed.   Electronically Signed   By: David  [place]   On: 07/10/2013 07:59   TELE  A fib with ventricular rates 95-105  BPM  Echocardiogram 07/09/2013 Study Conclusions  - Left ventricle: The cavity size was normal. There was mild focal basal hypertrophy of the  septum. Systolic function was severely reduced. The estimated ejection fraction was in the range of 20% to 25%. Diffuse hypokinesis. There is akinesis of the basal-midanteroseptal myocardium. Doppler parameters are consistent with abnormal left ventricular relaxation (grade 1 diastolic dysfunction). Cannot exclude apical thrombus. - Mitral valve: Mild regurgitation.  TTE 07/11/2013 Left ventricle: Systolic function was severely reduced. The estimated ejection fraction was in the range of 20% to 25%. Diffuse hypokinesis. There is akinesis of the basal and mid anteroseptal myocardium. Doppler parameters are consistent with abnormal left ventricular relaxation (grade 1 diastolic dysfunction). There is no apical thrombus. on images with echocontrast. The cavity size was normal. Systolic function was normal. Wall motion was normal; there  were no regional wall motion abnormalities.    ASSESSMENT AND PLAN  Patient is a 72 y.o. male with a PMHx of CAD, (LAD- 3.0 x 18 mm Xience Xpedition and 2.75 x 8 mm Xience Xpedition, RCA - 2.5x15 Xience Xpedition, 08/29/2011) , HTN, DM who was admitted to [facility] on 07/08/2013 for evaluation of pneumonia and worsening dyspnea. He was found to have a-fib.  1. Atrial fibrillation with rapid ventricular response: The patient presents with an episode of atrial fibrillation. He's had atrial fibrillation several times in the past (possible chronic persistent, now RVR due to underlying pneumonia). He was thought to be a poor Coumadin candidate because he has some memory problems. Currently on Apixaban. Echocardiogram showed severely impaired LVEF 20-25%, no apical thrombus. Ventricular rates better controlled but still increasing with minimal physical activity. Continue Metoprolol 100 mg PO BID and continue digoxin 0.125 mg po daily. We are expecting heart rate to improve with improving infection.  2. Acute on Chronic systolic CHF: now improved, we do not know the duration of his LV dysfunction. 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 Troponin levels are negative. No evidence for MI this admission. 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   3. Respiratory: Pneumonia, increasing WBC. On PO ATB.  If discharged please schedule a follow up appointment with [facility] cardiology at [phone].    Signed, Tobias Alexander, H MD, [facility] 07/13/2013

## 2013-07-13 NOTE — Discharge Summary (Signed)
Physician Discharge Summary  Darius Saunders ZOX:[id number] DOB: 01-22-1941 DOA: 07/08/2013  PCP: Provider Not In System  Admit date: 07/08/2013 Discharge date: 07/13/2013  Recommendations for Outpatient Follow-up:  1. Please note that we have made some adjustments to your medications per cardiology here at [facility]. We have stopped norvasc, coreg and have started metoprolol and digoxin. You may continue losartan 2. Another new medication is abixaban which is important as we have stopped aspirin and plavix 3. Continue metformin as before  4. Continue levaquin for pneumonia as prescribed 5. Please follow up with cardiology as soon as possible in [place] to make sure you are tolerating the medication well 6. Please note that you have required Cardizem drip on the admission to control the heart rate   Discharge Diagnoses:  Principal Problem:   Acute systolic congestive heart failure, NYHA class 3 Active Problems:   CAP (community acquired pneumonia)   Atrial fibrillation with RVR   DM2 (diabetes mellitus, type 2)   Acute respiratory failure with hypoxia   CAD (coronary artery disease)   Ischemic cardiomyopathy    Discharge Condition: medically stable for discharge home today  Diet recommendation: heart healthy  History of present illness:  72 y.o. male with past medical history of CAD and stent placements, hypertension, diabetes who presented to [facility] ED 07/08/2013 with worsening shortness of breath and chest pain started about one day prior to this admission. Patient has cough but not report sputum production. Patient reported he has chest pain when he tries to cough. No reports of fevers or chills.  In ED, BP was 103/78, HR 52 - 123, RR 18-31 and oxygen saturation was 89% on the admission. CXR revealed left lower lobe infiltrate. Cardiac enzymes were WNL. The 12 lead EKG was significant for atrial fibrillation for which reason he has required Cardizem drip. Per  cariology, he is now on PO beta blocker.   Assessment/Plan:   Principal Problem:  Acute respiratory failure with hypoxia  - Likely secondary to left lower lobe pneumonia; PE ruled out with negative CT angio chest  - respiratory status remains stable and he is off of nasal canula  - given solumedrol 60 mg IV Every 6 hours 07/09/2013 but now discontinued per PCCM  - continue Levaquin daily by mouth for 2 more days on discharge  Active Problems:  CAP (community acquired pneumonia)  - left lower lobe penumonia based on CXR  - started azithromycin and rocephin on the admission but this was changed to PO Levaquin per [facility] 07/10/2013  - blood cultures, strep, influenza all negative; legionella negative  Atrial fibrillation with RVR  - on Cardizem drip initially but per cardiology switched to beta blocker. Metoprolol; HR then went up as high as 150's so back on Cardizem drip and then to PO again started 07/12/2013 - now on metoprolol 100 mg PO BID - continue apixaban and digoxin  - appreciate cardiology following  CAD  - continue digoxin 0.125 mg PO daily  - on apixaban as well  - he was also on lasix 40 mg IV every 6 hours but this is now discontinued (d/c'ed 07/10/2013)  Hypertension  - continue metoprolol and losartan  DM2 (diabetes mellitus, type 2)  - may continue metformin on discharge  Acute renal failure  - likely due to lasix   Code Status: full code  Family Communication: pt wife at the bedside   Consultants:  PCCM  Cardiology Procedures:  None  Antibiotics:  Azithromycin 07/08/2013 --> 07/10/2013  Rocephin 07/08/2013 --> 07/10/2013  Levaquin 07/10/2013 --> for 2 more days on discharge    Manson Passey, MD  [facility]  Pager [phone]   Signed:  Manson Passey, MD  [facility] 07/13/2013, 11:14 AM  Pager #: [phone]   Discharge Exam: Filed Vitals:   07/13/13 0510  BP: 128/86  Pulse: 99  Temp: 97.9 F (36.6 C)  Resp: 18   Filed  Vitals:   07/12/13 1026 07/12/13 1200 07/12/13 2040 07/13/13 0510  BP: 135/71 112/71 120/77 128/86  Pulse:   76 99  Temp:  97.5 F (36.4 C) 97.4 F (36.3 C) 97.9 F (36.6 C)  TempSrc:  Oral Axillary Oral  Resp: 19 24 20 18   Height:      Weight:      SpO2:   100% 96%    General: Pt is alert, follows commands appropriately, not in acute distress Cardiovascular: irregular rhythm, slightly tachycardic, S1/S2 +, no murmurs, no rubs, no gallops Respiratory: Clear to auscultation bilaterally, no wheezing, no crackles, no rhonchi Abdominal: Soft, non tender, non distended, bowel sounds +, no guarding Extremities: no edema, no cyanosis, pulses palpable bilaterally DP and PT Neuro: Grossly nonfocal  Discharge Instructions  Discharge Orders   Future Orders Complete By Expires   Call MD for:  difficulty breathing, headache or visual disturbances  As directed    Call MD for:  persistant dizziness or light-headedness  As directed    Call MD for:  persistant nausea and vomiting  As directed    Call MD for:  severe uncontrolled pain  As directed    Diet - low sodium heart healthy  As directed    Discharge instructions  As directed    Comments:     1. Please note that we have made some adjustments to your medications per cardiology here at [facility]. We have stopped norvasc, coreg and have started metoprolol and digoxin. You may continue losartan 2. Another new medication is abixaban which is important as we have stopped aspirin and plavix 3. Continue metformin as before  4. Continue levaquin for pneumonia as prescribed 5. Please follow up with cardiology as soon as possible in [place] to make sure you are tolerating the medication well 6. Please note that you have required Cardizem drip on the admission to control the heart rate   Increase activity slowly  As directed        Medication List    STOP taking these medications       amLODipine 5 MG tablet  Commonly known  as:  NORVASC     aspirin EC 81 MG tablet     carvedilol 6.25 MG tablet  Commonly known as:  COREG     clopidogrel 75 MG tablet  Commonly known as:  PLAVIX     hydrochlorothiazide 12.5 MG capsule  Commonly known as:  MICROZIDE      TAKE these medications       apixaban 5 MG Tabs tablet  Commonly known as:  ELIQUIS  Take 1 tablet (5 mg total) by mouth 2 (two) times daily.     cholecalciferol 1000 UNITS tablet  Commonly known as:  VITAMIN D  Take 1,000 Units by mouth daily.     co-enzyme Q-10 30 MG capsule  Take 30 mg by mouth every evening.     digoxin 0.125 MG tablet  Commonly known as:  LANOXIN  Take 1 tablet (0.125 mg total) by mouth daily.     folic  acid 1 MG tablet  Commonly known as:  FOLVITE  Take 1 mg by mouth daily.     levofloxacin 750 MG tablet  Commonly known as:  LEVAQUIN  Take 1 tablet (750 mg total) by mouth daily.     losartan 50 MG tablet  Commonly known as:  COZAAR  Take 50 mg by mouth 2 (two) times daily.     metFORMIN 500 MG tablet  Commonly known as:  GLUCOPHAGE  Take 1,000 mg by mouth 2 (two) times daily with a meal.     metoprolol 100 MG tablet  Commonly known as:  LOPRESSOR  Take 1 tablet (100 mg total) by mouth 2 (two) times daily.     multivitamin with minerals Tabs tablet  Take 1 tablet by mouth 2 (two) times daily.     omega-3 acid ethyl esters 1 G capsule  Commonly known as:  LOVAZA  Take 1 g by mouth daily.     VITAMIN B-12 IJ  Inject 1,000 mg as directed every 30 (thirty) days.     vitamin C 500 MG tablet  Commonly known as:  ASCORBIC ACID  Take 500 mg by mouth 2 (two) times daily.           Follow-up Information   Follow up with Provider Not In System.       The results of significant diagnostics from this hospitalization (including imaging, microbiology, ancillary and laboratory) are listed below for reference.    Significant Diagnostic Studies: Dg Chest 2 View  07/08/2013   CLINICAL DATA:  Short of breath.   Chest pain.  EXAM: CHEST  2 VIEW  COMPARISON:  None.  FINDINGS: There is opacity at the left lung base partly silhouetting the left hemidiaphragm. This is consistent with pneumonia, or other infiltrate, likely with a minimal pleural effusion.  The lungs are otherwise clear. No right pleural effusion. No pneumothorax.  Cardiac silhouette is normal in size. The mediastinum and hilar contours are unremarkable.  IMPRESSION: Left lower lobe infiltrate.   Electronically Signed   By: Amie Portland M.D.   On: 07/08/2013 16:41   Ct Angio Chest Pe W/cm &/or Wo Cm  07/09/2013   CLINICAL DATA:  Decreased oxygen saturation  EXAM: CT ANGIOGRAPHY CHEST WITH CONTRAST  TECHNIQUE: Multidetector CT imaging of the chest was performed using the standard protocol during bolus administration of intravenous contrast. Multiplanar CT image reconstructions including MIPs were obtained to evaluate the vascular anatomy.  CONTRAST:  OMNIPAQUE IOHEXOL 350 MG/ML SOLN  COMPARISON:  None.  FINDINGS: There are no filling defects in the pulmonary arterial tree to suggest acute pulmonary thromboembolism.  2.0 cm right thyroid nodule.  No abnormal mediastinal adenopathy. Coronary artery calcifications are noted.  Moderate size bilateral pleural effusions. Compressive atelectasis in both lower lobes as well as the posterior upper lobes.  No acute bony deformity.  Review of the MIP images confirms the above findings.  IMPRESSION: No evidence of acute pulmonary thromboembolism.  Moderate bilateral pleural effusions.   Electronically Signed   By: Maryclare Bean M.D.   On: 07/09/2013 14:08   Dg Chest Port 1 View  07/10/2013   CLINICAL DATA:  Pulmonary edema, history of myocardial infarction.  EXAM: PORTABLE CHEST - 1 VIEW  COMPARISON:  PA and lateral chest x-ray dated July 08, 2013  FINDINGS: The lungs are adequately inflated. The interstitial markings have become much more conspicuous since the previous study. The left hemidiaphragm is  obscured. The cardiopericardial silhouette is enlarged.  The central pulmonary vascularity is engorged.  IMPRESSION: The findings are consistent with congestive heart failure with pulmonary interstitial edema. A small pleural effusion on the left has likely developed.   Electronically Signed   By: David  [place]   On: 07/10/2013 07:59    Microbiology: Recent Results (from the past 240 hour(s))  MRSA PCR SCREENING     Status: None   Collection Time    07/08/13 11:12 PM      Result Value Range Status   MRSA by PCR NEGATIVE  NEGATIVE Final   Comment:            The GeneXpert MRSA Assay (FDA     approved for NASAL specimens     only), is one component of a     comprehensive MRSA colonization     surveillance program. It is not     intended to diagnose MRSA     infection nor to guide or     monitor treatment for     MRSA infections.  CULTURE, BLOOD (ROUTINE X 2)     Status: None   Collection Time    07/09/13 10:16 AM      Result Value Range Status   Specimen Description BLOOD RIGHT HAND  5.5 ML IN [facility] BOTTLE   Final   Special Requests NONE   Final   Culture  Setup Time     Final   Value: 07/09/2013 18:41     Performed at [facility]   Culture     Final   Value:        BLOOD CULTURE RECEIVED NO GROWTH TO DATE CULTURE WILL BE HELD FOR 5 DAYS BEFORE ISSUING A FINAL NEGATIVE REPORT     Performed at [facility]   Report Status PENDING   Incomplete  CULTURE, BLOOD (ROUTINE X 2)     Status: None   Collection Time    07/09/13 10:40 AM      Result Value Range Status   Specimen Description BLOOD RIGHT HAND  5.5 ML IN [facility] BOTTLE   Final   Special Requests NONE   Final   Culture  Setup Time     Final   Value: 07/09/2013 18:40     Performed at [facility]   Culture     Final   Value:        BLOOD CULTURE RECEIVED NO GROWTH TO DATE CULTURE WILL BE HELD FOR 5 DAYS BEFORE ISSUING A FINAL NEGATIVE REPORT     Performed at [facility]   Report Status PENDING    Incomplete  RESPIRATORY VIRUS PANEL     Status: None   Collection Time    07/09/13 10:49 AM      Result Value Range Status   Source - RVPAN NOSE   Final   Respiratory Syncytial Virus A NOT DETECTED   Final   Respiratory Syncytial Virus B NOT DETECTED   Final   Influenza A NOT DETECTED   Final   Influenza B NOT DETECTED   Final   Parainfluenza 1 NOT DETECTED   Final   Parainfluenza 2 NOT DETECTED   Final   Parainfluenza 3 NOT DETECTED   Final   Metapneumovirus NOT DETECTED   Final   Rhinovirus NOT DETECTED   Final   Adenovirus NOT DETECTED   Final   Influenza A H1 NOT DETECTED   Final   Influenza A H3 NOT DETECTED   Final   Comment: (  NOTE)           Normal Reference Range for each Analyte: NOT DETECTED     Testing performed using the Luminex xTAG Respiratory Viral Panel test     kit.     This test was developed and its performance characteristics determined     by [facility]. It has not been cleared or approved by the [place]     Food and Drug Administration. This test is used for clinical purposes.     It should not be regarded as investigational or for research. This     laboratory is certified under the Clinical Laboratory Improvement     Amendments of 1988 (CLIA) as qualified to perform high complexity     clinical laboratory testing.     Performed at [facility]: Basic Metabolic Panel:  Recent Labs Lab 07/08/13 1610 07/09/13 0230 07/10/13 0357 07/11/13 0327 07/13/13 0628  NA 135 137 134* 136 138  K 4.1 3.6 3.4* 3.5 3.8  CL 101 105 100 103 105  CO2 18* 19 13* 18* 20  GLUCOSE 221* 213* 390* 262* 171*  BUN 42* 40* 35* 46* 38*  CREATININE 1.43* 1.28 1.23 1.64* 1.16  CALCIUM 9.4 9.0 8.9 9.0 8.5   Liver Function Tests:  Recent Labs Lab 07/08/13 1648  AST 23  ALT 22  ALKPHOS 91  BILITOT 0.6  PROT 6.5  ALBUMIN 3.4*   No results found for this basename: LIPASE, AMYLASE,  in the last 168 hours No results found for this basename:  AMMONIA,  in the last 168 hours CBC:  Recent Labs Lab 07/08/13 1610 07/09/13 0230 07/10/13 0357 07/11/13 0327  WBC 9.2 8.5 11.6* 16.0*  NEUTROABS  --   --  10.9*  --   HGB 13.2 12.3* 14.0 12.9*  HCT 37.7* 35.2* 38.9* 37.6*  MCV 87.9 88.0 85.3 87.0  PLT 207 200 238 212   Cardiac Enzymes:  Recent Labs Lab 07/08/13 1626 07/08/13 2030 07/09/13 0230 07/09/13 0810  TROPONINI <0.30 <0.30 <0.30 <0.30   BNP: BNP (last 3 results)  Recent Labs  07/08/13 1552  PROBNP 2939.0*   CBG:  Recent Labs Lab 07/11/13 2155 07/12/13 0804 07/12/13 1205 07/12/13 1636 07/12/13 2126  GLUCAP 181* 206* 249* 192* 187*    Time coordinating discharge: Over 30 minutes

## 2013-07-13 NOTE — Progress Notes (Signed)
Eliquis free 30-day trail offer given to pt.

## 2013-07-13 NOTE — Progress Notes (Signed)
Pt discharged home via family; Pt and family given and explained all discharge instructions, carenotes, and prescriptions; pt and family stated understanding and denied questions/concerns; all f/u appointments in place; IV removed without complicaitons; pt stable at time of discharge   Pts sister at bedside and is taking pt back to [facility]. They confirmed that the patient already has a follow up appointment with his cardiologist.

## 2013-07-14 LAB — GLUCOSE, CAPILLARY: Glucose-Capillary: 104 mg/dL — ABNORMAL HIGH (ref 70–99)

## 2013-07-15 LAB — CULTURE, BLOOD (ROUTINE X 2)
Culture: NO GROWTH
Culture: NO GROWTH

## 2013-07-21 ENCOUNTER — Ambulatory Visit (INDEPENDENT_AMBULATORY_CARE_PROVIDER_SITE_OTHER): Payer: Medicare Other | Admitting: Internal Medicine

## 2013-07-21 DIAGNOSIS — E119 Type 2 diabetes mellitus without complications: Secondary | ICD-10-CM

## 2013-07-21 DIAGNOSIS — I259 Chronic ischemic heart disease, unspecified: Secondary | ICD-10-CM

## 2013-07-21 DIAGNOSIS — I2510 Atherosclerotic heart disease of native coronary artery without angina pectoris: Secondary | ICD-10-CM

## 2013-07-21 DIAGNOSIS — I4891 Unspecified atrial fibrillation: Secondary | ICD-10-CM

## 2013-07-21 DIAGNOSIS — I480 Paroxysmal atrial fibrillation: Secondary | ICD-10-CM

## 2013-07-21 DIAGNOSIS — I5030 Unspecified diastolic (congestive) heart failure: Secondary | ICD-10-CM

## 2013-07-21 DIAGNOSIS — I447 Left bundle-branch block, unspecified: Secondary | ICD-10-CM

## 2013-07-21 DIAGNOSIS — I10 Essential (primary) hypertension: Secondary | ICD-10-CM

## 2013-07-21 DIAGNOSIS — R4182 Altered mental status, unspecified: Secondary | ICD-10-CM

## 2013-07-21 DIAGNOSIS — Z95820 Peripheral vascular angioplasty status with implants and grafts: Secondary | ICD-10-CM

## 2013-07-21 MED ORDER — CARVEDILOL 12.5 MG TABLET
12.50 mg | ORAL_TABLET | Freq: Two times a day (BID) | ORAL | Status: DC
Start: 2013-07-21 — End: 2013-08-25

## 2013-07-21 NOTE — Progress Notes (Addendum)
OV dictated    Kaimana Lurz L. Maryjane Benedict, PA-C    I have seen and evaluated the patient jointly with Veda Arrellano PA-C and agree with her assessment, recommendations and plans.      Chalak Berzingi, MD 07/25/2013 11:25 AM

## 2013-07-22 NOTE — Progress Notes (Addendum)
[facility]  [address] [place], [place] [address]    PROGRESS NOTE    PATIENT NAME: [name], [name]  CHART NUMBER: [id number]  DATE OF BIRTH:   DATE OF SERVICE: 07/21/2013    [facility]  [address]  [place], [place]  [address]     SUBJECTIVE:  This is a 73 year old white male who underwent stenting of his LAD as well as RCA in February 2013 with depressed EF of 40-50%.  Also, he had a history of paroxysmal atrial fibrillation and left bundle branch block.  Risk factors also include diabetes mellitus, noninsulin dependent, hypertension, as well as hyperlipidemia.    He was recently, on July 13, 2013, hospitalized in [place], [place], with pneumonia and, at that time, had recurrence of atrial fibrillation with RVR and subsequent congestive heart failure.  His medications were changed with Norvasc being discontinued.  He was started on digoxin as well as Eliquis.  Metoprolol was also added, but his primary care physician has discontinued this by family request for complaints of fatigue and has started Coreg instead, which he is able to tolerate.  In the office today, he denies any complaints of chest pain or shortness of breath at rest and no dyspnea upon exertion.  No orthopnea or PND.  No palpitations.  He does have an occasional nonproductive cough and intermittent wheezing.    ALLERGIES:  LIPITOR and PRAVASTATIN.    MEDICATIONS:  1.  Eliquis 5 mg p.o. q. day.  2.  Coreg 6.25 mg p.o. q. day.  3.  Lanoxin 125 mcg p.o. q. day.  4.  Folvite 1 mg q. day.  5.  Cozaar 50 mg p.o. q. day.  6.  Multivitamin 1 p.o. q. day.    7.  Potassium gluconate 99 mg q. day.  8.  Zostavax subcutaneously.    REVIEW OF SYSTEMS:  Denies either fever or chills.  No headache, syncope or near syncopal event.  Denies chest pain, shortness of breath at rest or dyspnea upon exertion.  No orthopnea or PND.  No palpitations.  He does have a frequent nonproductive cough with  intermittent wheezing.  No hemoptysis.  No abdominal pain, nausea, vomiting, diarrhea or change in either bowel or bladder habits.  No melena or tarry stool.  No edema nor known weight change.    OBJECTIVE:  On physical exam, blood pressure 114/68, pulse is 77 and regular, respirations 18, even and nonlabored.  Height is 6 feet 0 inches, weight 205 pounds and SpO2 is 96% on room air.  On general exam, he is alert and poor historian.  Normocephalic, atraumatic.  Sclerae is clear, nonicteric.  Neck is supple without adenopathy, JVD or bruit.  There is no thyromegaly.  Lungs are clear to auscultation with equal air entry bilaterally.  Heart is regular rate without audible murmur, gallop or rub.  Abdomen is soft, nontender, nondistended without hepatosplenomegaly or palpable mass, and there are positive bowel sounds throughout.  Extremities are without clubbing, cyanosis or edema as well as symmetrical pulses.    LABORATORY DATA:  EKG in the office today recurrence of atrial fibrillation, RVR 111.    ASSESSMENT:  1.  Paroxysmal atrial fibrillation with recurrence, RVR.  2.  Left bundle branch block.  3.  History of coronary artery disease with previous stenting of the LAD as well as RCA.  4.  Depressed left ventricular function with an EF of 40-45%.  5.  Hypertension, well  controlled.  6.  Hyperlipidemia with intolerance to statins.  7.  Short-term memory deficits.  8.  Recent treatment for pneumonia as well as congestive heart failure.    PLAN:  1.  We will increase his Coreg to 12.5 mg p.o. q. day.  2.  Agree with Eliquis as well as addition of Digoxin.    3.  We will see him back in the clinic in 6 weeks with planned repeat 2D echo within 3 months.      Gweneth FritterSuzanne Lee Talton Delpriore, PA-C  Section of Cardiology  [facility]    Linward Natalhalak Omer Berzingi, MD  Assistant Professor, Section of Cardiology  [facility]    [id number]; D: 07/21/2013 14:13:47; T: 07/21/2013 22:02:50    cc: [facility]       [address]       [place], [place] [phone]  I have seen and evaluated the patient jointly with Herbie SaxonSuzanne Thailyn Khalid PA-C and agree with her assessment, recommendations and plans. CAD stable, a fib on rate control and Eliquis, still RVR, will increased coreg and reassess in 3 months.      Vincent Groshalak Berzingi, MD 07/28/2013 12:04 PM

## 2013-07-28 ENCOUNTER — Other Ambulatory Visit (INDEPENDENT_AMBULATORY_CARE_PROVIDER_SITE_OTHER): Payer: Self-pay

## 2013-08-23 ENCOUNTER — Encounter (INDEPENDENT_AMBULATORY_CARE_PROVIDER_SITE_OTHER): Payer: Medicare Other | Admitting: Internal Medicine

## 2013-08-25 ENCOUNTER — Encounter (INDEPENDENT_AMBULATORY_CARE_PROVIDER_SITE_OTHER): Payer: Self-pay | Admitting: Internal Medicine

## 2013-08-25 ENCOUNTER — Ambulatory Visit (INDEPENDENT_AMBULATORY_CARE_PROVIDER_SITE_OTHER): Payer: Medicare Other | Admitting: Internal Medicine

## 2013-08-25 VITALS — BP 118/84 | HR 73 | Resp 20 | Ht 72.0 in | Wt 200.0 lb

## 2013-08-25 DIAGNOSIS — I259 Chronic ischemic heart disease, unspecified: Secondary | ICD-10-CM

## 2013-08-25 DIAGNOSIS — Z9889 Other specified postprocedural states: Secondary | ICD-10-CM

## 2013-08-25 DIAGNOSIS — I2510 Atherosclerotic heart disease of native coronary artery without angina pectoris: Secondary | ICD-10-CM

## 2013-08-25 DIAGNOSIS — Z95820 Peripheral vascular angioplasty status with implants and grafts: Secondary | ICD-10-CM

## 2013-08-25 DIAGNOSIS — I10 Essential (primary) hypertension: Secondary | ICD-10-CM

## 2013-08-25 DIAGNOSIS — I4891 Unspecified atrial fibrillation: Secondary | ICD-10-CM

## 2013-08-25 NOTE — Progress Notes (Signed)
[facility]  [address]  [place], [place] [id number]    PROGRESS NOTE    PATIENT NAME: Jim MainlandSNEDEGAR, Jarick N  CHART NUMBER: [id number]  DATE OF BIRTH:   DATE OF SERVICE: 08/25/2013    [facility]  [address]  [place], [place]  [phone]         PRIMARY CARE PHYSICIAN:  Nehemiah SettleH Ariel Valentine MD.    SUBJECTIVE:  [name] is a 73 year old gentleman with history of coronary artery disease status post PCI with implantation of drug-eluting stents to LAD, a 3.0 x 18 mm Xpedition as well as overlapped distally with a 2.75 x 9 mm Xpedition stent and a 2.5 x 15 mm Xpedition stent to the right coronary artery.  This was in February 2013.  He also has a history of ischemic cardiomyopathy and recently also diagnosed with atrial fibrillation on Eliquis and rate control.  He also has dementia, pleasantly forgetful.  He was accompanied by his sister during today's office visit.  She had recently noted some ankle swelling which has improved since the addition of Lasix.  No complaints of chest pain, maybe some shortness of breath when he wakes up in the morning, however, as he does activity it gets better and it seems like his activity has increased significantly more recently per the report.  No orthopnea or paroxysmal nocturnal dyspnea.  No syncope or near syncope.  He is compliant with his medications, which includes:  1.  Eliquis 5 mg twice daily.  2.  Coreg 25 mg twice daily.  3.  Losartan 50 mg once daily.  4.  Fish oil.  5.  Lasix 20 mg daily.  6.  Metformin 500 mg daily.  7.  Glipizide 2.5 mg daily.  8.  Digoxin 0.125 mg daily.  9.  Potassium gluconate 595 mg.  10.  Folic acid 1 mg.  11.  Vitamin C.  12.  Multivitamin.    ALLERGIES:  He has known allergy to Lipitor, could not tolerate it.    REVIEW OF SYSTEMS:  CNS:  No headache.  No visual changes, just forgetful.  Ears, nose and throat:  No sore throat, no earache.  Respiratory:  Some shortness of breath early in the morning, no  active wheezing.  GI:  No nausea, vomiting, diarrhea.    OBJECTIVE:  He is awake and alert, in no acute distress, 6 feet tall, weighs 200 pounds, pulse 73 beats per minute, blood pressure 118/84, pulse ox 99%.  Head:  Atraumatic.  Neck:  Supple, no jugular venous distention, no carotid bruit.  Chest:  Clear to auscultation.  Heart:  Normal S1, normal S2, irregularly irregular, no murmurs.  Abdomen:  Soft, nontender.  Extremities:  Equal pulses.  No cyanosis or finger clubbing.  Nervous system:  He is oriented x3 with no gross motor.  He is oriented to time, place and person, just forgetful overall.    ASSESSMENT AND PLAN:  1.  Coronary artery disease status post PCI to LAD and RCA.  No symptoms to suggest angina.  He is advised to continue his current medical therapy that includes beta-blocker, ARB.  He is not on a statin because of dementia that he has and also he was intolerant to it.  2.  Mild ischemic cardiomyopathy.  He is going to repeat an echocardiogram with contrast.  3.  Atrial fibrillation, rate controlled, also on Eliquis.  I will make an attempt to contact [employer] his insurance to  see if he can get approval for his Eliquis as he is forgetful and would be difficult for him to manage Coumadin and the adjustments needed so Eliquis would be optimal for him.  4.  CHF.  We will refer him also for cardiac rehabilitation.  5.  Atrial fibrillation on rate control and anticoagulation.  We will discuss his other options next visit.  I think rate control seems to be as adequate for him as attempting any cardioversion.      Linward Natal, MD  Assistant Professor, Section of Cardiology  [facility]    [id number]; D: 08/25/2013 12:09:57; T: 08/25/2013 14:31:21    cc: Nehemiah Settle MD      [address]       [place], [place] [address]

## 2013-08-25 NOTE — H&P (Signed)
See dictated note.

## 2013-09-01 ENCOUNTER — Encounter (INDEPENDENT_AMBULATORY_CARE_PROVIDER_SITE_OTHER): Payer: Medicare Other | Admitting: Internal Medicine

## 2013-09-16 DIAGNOSIS — I079 Rheumatic tricuspid valve disease, unspecified: Secondary | ICD-10-CM

## 2013-09-22 ENCOUNTER — Other Ambulatory Visit (INDEPENDENT_AMBULATORY_CARE_PROVIDER_SITE_OTHER): Payer: Self-pay | Admitting: Internal Medicine

## 2013-10-24 ENCOUNTER — Ambulatory Visit (INDEPENDENT_AMBULATORY_CARE_PROVIDER_SITE_OTHER): Payer: Medicare Other | Admitting: Internal Medicine

## 2013-10-24 ENCOUNTER — Encounter (INDEPENDENT_AMBULATORY_CARE_PROVIDER_SITE_OTHER): Payer: Self-pay | Admitting: Internal Medicine

## 2013-10-24 VITALS — BP 104/62 | HR 106 | Resp 16 | Ht 72.0 in | Wt 196.0 lb

## 2013-10-24 DIAGNOSIS — I255 Ischemic cardiomyopathy: Secondary | ICD-10-CM | POA: Insufficient documentation

## 2013-10-24 DIAGNOSIS — I447 Left bundle-branch block, unspecified: Secondary | ICD-10-CM

## 2013-10-24 DIAGNOSIS — Z95820 Peripheral vascular angioplasty status with implants and grafts: Secondary | ICD-10-CM

## 2013-10-24 DIAGNOSIS — I2589 Other forms of chronic ischemic heart disease: Secondary | ICD-10-CM

## 2013-10-24 DIAGNOSIS — I509 Heart failure, unspecified: Secondary | ICD-10-CM

## 2013-10-24 DIAGNOSIS — I259 Chronic ischemic heart disease, unspecified: Secondary | ICD-10-CM

## 2013-10-24 DIAGNOSIS — I2510 Atherosclerotic heart disease of native coronary artery without angina pectoris: Secondary | ICD-10-CM

## 2013-10-24 DIAGNOSIS — I4891 Unspecified atrial fibrillation: Secondary | ICD-10-CM

## 2013-10-24 HISTORY — DX: Heart failure, unspecified (CMS HCC): I50.9

## 2013-10-24 NOTE — Progress Notes (Signed)
See dictated note.

## 2013-10-25 NOTE — Progress Notes (Signed)
[facility]  [address] [place], [place] [address]    PROGRESS NOTE    PATIENT NAME: [name], [name]  CHART NUMBER: [id number]  DATE OF BIRTH:   DATE OF SERVICE: 10/24/2013    ADDENDED REPORT    [facility]  [address]  [place], [place]  [address]        PRIMARY CARE PHYSICIAN:  H. Hubert Azure, MD     SUBJECTIVE:  [name] is a 73 year old gentleman with a history of coronary artery disease status post PCI to LAD with implantation of a 3.0 x 18 Xpedition as well as distally overlapped with a 2.75 x 9 Xpedition as well as PCI to RCA with implantation of a 2.5 x 15-mm Xpedition stent in February 2013.  He had severe ischemic cardiomyopathy and more recently diagnosed with atrial fibrillation.  He had an underlying left bundle branch block.  He has been started on Eliquis for long-term anticoagulation as well as rate control with Coreg.  He has an underlying dementia with mild forgetfulness.  His sister for the most part helps him in order to manage his affairs.      During today's office visit, denies any complaints of chest pain, shortness of breath, palpitations, syncope or near syncope.  He does indicate that he gets some swelling around his ankles.  He was able to walk 2000-3000 steps, today actually 5000.  However, he does have difficulty and climbing up steps and whenever he gets extra fluid retention on him he will take under the supervision of sister an extra Lasix.     CURRENT MEDICATIONS:  1.  Apixaban (Eliquis) 5 mg twice daily.  He is not on a statin because of a recent development of dementia last year we decided to discontinue that.  2.  Coreg 25 mg twice daily.  3.  Cozaar 50 mg once daily.  4.  Furosemide 40 mg tablet daily with an extra tablet if he has extra fluid on him.        In addition to his other medications including metformin, multivitamin, Glucotrol, Folvite, Feratab, Lanoxin 125 mcg and vitamin C.     ALLERGIES:  He has intolerance  to Lipitor and pravastatin.  He had a rash with pravastatin.     REVIEW OF SYSTEMS:  CNS:  No headache.  No visual changes.  No weakness or numbness.  He is just forgetful.  Ears, nose and throat:  No sore throat.  No earache.  Respiratory:  No cough, fever or chills.  GI:  No nausea, vomiting or diarrhea.  Genitourinary:  No dysuria, burning or frequency.  Skin:  No pruritus or rash.     OBJECTIVE:  He is awake and alert, in no acute distress.  He is 6 feet tall, weighs 196 pounds, pulse 106 beats per minute, irregularly irregular, blood pressure 104/62, pulse ox 96%.  Head:  Atraumatic.  Neck:  Supple.  Chest:  Clear.  Heart:  Normal S1, normal S2, irregularly irregular, soft systolic murmur grade 2/6.  Abdomen:  Soft, nontender, no organomegaly, no masses.  Extremities:  Equal pulses.  No cyanosis or finger clubbing.  Nervous system:  He is oriented x3.  During today's evaluation moves all of his extremities.     IMPRESSION AND PLAN:  1.  Coronary artery disease status post PCI to LAD and RCA.  Stable, no symptoms to suggest angina.  He is advised to continue with Coreg and losartan.  He was not able to tolerate statins.  2.  Severe ischemic cardiomyopathy, EF of 15-20% by recent echocardiogram that was done on September 16, 2013.  He is on Eliquis as well as Coreg.  He is advised to continue both.  He is also on Lanoxin.  His heart rate is still slightly out of control; however, he walked today are above 5000 steps, according to his sister coming here.  I have elected not to adjust the medication at this time. He is also on a beta-blocker, ARB and Lasix.  He is not on Aldactone and may consider adding Aldactone at next visit.  3.  He is functional class 2-3, closer to 2.  4.  We discussed the option of considering ICD possibly a biventricular in order for him to cardiac EP for further consideration.  At this point, he seems to be a lot more stable.  I will reassess him in 3 months.           ZO/[id number]; D:  10/24/2013 15:51:00; T: 10/25/2013 05:45:10     Electronically signed by Vincent GrosBerzingi, Amamda Curbow, MD on 10/25/13 1228         ADDENDUM:  [name] has chronic systolic congestive heart failure with chronic atrial fibrillation.  He is functional class III with a left bundle branch block.  I have recommended that he do cardiac rehabilitation.  He is qualified for that intervention and we will discuss the option of ICD at the next visit.      Linward Natalhalak Omer Dorean Daniello, MD  Assistant Professor, Section of Cardiology  [facility] Medicine    [id number]; D: 10/27/2013 14:48:32; T: 10/28/2013 10:19:11    cc: Jim SettleH Ariel Valentine MD      [address]       [place], [place] [id number]

## 2013-11-02 ENCOUNTER — Ambulatory Visit (INDEPENDENT_AMBULATORY_CARE_PROVIDER_SITE_OTHER): Payer: Medicare Other

## 2013-11-02 VITALS — BP 122/86 | HR 105 | Resp 16 | Ht 72.0 in | Wt 200.0 lb

## 2013-11-02 DIAGNOSIS — I509 Heart failure, unspecified: Secondary | ICD-10-CM

## 2013-11-02 DIAGNOSIS — I447 Left bundle-branch block, unspecified: Secondary | ICD-10-CM

## 2013-11-02 MED ORDER — SPIRONOLACTONE 25 MG TABLET
25.0000 mg | ORAL_TABLET | Freq: Every day | ORAL | Status: DC
Start: 2013-11-02 — End: 2016-03-14

## 2013-11-02 NOTE — Patient Instructions (Signed)
Start aldactone and check potassium level Friday.  Stop supplemental potassium pill.  We will schedule a biventricular pacemaker implantation.

## 2013-11-02 NOTE — H&P (Signed)
[facility]  [address] [place], [place] [address]    HISTORY AND PHYSICAL    PATIENT NAME: [name], [name]  CHART NUMBER: [id number]  DATE OF BIRTH:   DATE OF SERVICE: 11/02/2013    [facility]  [address]  [place], [place]  [address]         REASON FOR REQUEST:  Congestive failure.    REQUESTING PHYSICIAN:  Linward Natal MD.    IMPRESSION:  1.  Ischemic cardiomyopathy.  The patient is remote from coronary revascularization and has a residual ejection fraction of 10-15%.  This is on a medical regimen that includes a study-proven beta-blocker and an angiotensin receptor blocker.  Today, we will add a low dose of Aldactone and check potassium on Friday.  The patient has an underlying left bundle branch block with a QRS duration of over 140 milliseconds.  He has cognitive deficits.  I have had a discussion with him and his sister regarding the possibility of a biventricular pacemaker versus biventricular defibrillator.  They are currently leaning more towards a biventricular pacing system to avoid the potential painful therapies of a defibrillator.  Biventricular pacing does carry a projection of some improvement in overall mortality as well.  The patient will stay on Xarelto for the procedure.  2.  Atrial fibrillation.  The patient has been in longstanding persistent atrial fibrillation.  He has been appropriately anticoagulated for months.  As part of our procedure, we will attempt restoration of a sinus mechanism with direct current shock.  If sinus rhythm is restored we would implant an atrial lead and consider the addition of amiodarone for maintenance.  3.  Diabetes mellitus.  4.  Hypertension.  5.  Early dementia.    PLAN:  1.  Aldactone 25 mg q.a.m.  2.  Check potassium on Friday.  3.  Biventricular pacemaker implantation with cardioversion of atrial fibrillation.    HISTORY OF PRESENT ILLNESS:  The patient is a 73 year old gentleman with underlying  ischemic heart disease.  He has undergone percutaneous coronary interventions as recently as February 2013.  A followup echocardiogram in March of this year shows a continued ejection fraction of 15-20%.  The patient is on a study-proven beta-blocker and angiotensin receptor blocker.  He was in the hospital with pneumonia in December and has been in atrial fibrillation with a focus on rate control with Eliquis since that time.  He has no bleeding complaints.    The patient has good days and bad days.  At times he can walk a mile or more.  Other days he has dyspnea with activities of daily living.  There is no orthopnea or PND.  There is no near syncope or syncope.    PAST MEDICAL HISTORY:  Hypertension, diabetes mellitus, coronary artery disease per above, atrial fibrillation, early dementia.    MEDICATIONS:  1.  Eliquis 5 mg b.i.d.  2.  Vitamin C.  3.  Coreg 25 mg b.i.d.  4.  Lanoxin 125 mcg daily.  5.  Iron 324 mg daily.  6.  Folate 1 mg daily.  7.  Lasix 40 mg daily with extra doses as deemed necessary by his sister.    8.  Glucotrol 5 mg daily.  9.  Cozaar 50 mg b.i.d.  10.  Glucophage 500 mg b.i.d.  11.  Multivitamin.    ALLERGIES:  Lipitor.    SOCIAL HISTORY:  The patient is attended by his sister.  He is  a nonsmoker with social ethanol use.    FAMILY HISTORY:  Cardiac history is negative.    REVIEW OF SYSTEMS:  There are no weight fluctuations, fevers, chills or sweats.  There is no acute visual, auditory, or focal neurologic complaint.  There is no cough, sputum production, wheezing or hemoptysis.  There is no nausea, vomiting, reflux, diarrhea, bloody or black stools.  There is no flank pain, hematuria or dysuria.  There is no acute muscle or joint complaints.  He does have chronic pedal edema.    PHYSICAL EXAMINATION:  The patient is a pleasant gentleman who is alert and oriented x4.  He is in no acute distress.  The BMI is 27.1.  The blood pressure 122/86.  The pulse is 100 and irregularly irregular.  The  respiratory rate is 16 and unlabored with a room air saturation of 96%.  Head is normocephalic, atraumatic without thyromegaly or adenopathy.  Lung fields are clear.  Carotid upstrokes are brisk without bruits.  The jugular venous pressure is estimated at 5 cm of water.  There is an irregular rate and rhythm with no audible gallop or murmur.  Bowel sounds are active, without abdominal or renal bruits.  There are no masses or tenderness.  There is 1-2+ pretibial edema bilaterally.    A 12-lead electrocardiogram shows atrial fibrillation conducted with a left bundle branch block.  The QRS duration is in excess of 140 milliseconds.      Jackquline Boschobert Layne Dilauro, MD  Associate Professor  [facility] Department of Medicine, Section of Cardiology    ZO/[id number]; D: 11/02/2013 13:59:53; T: 11/02/2013 14:17:07    cc: Linward Natalhalak Omer Berzingi MD      [facility]              Eric R Anger MD      [address]       [place], [place] [id number]

## 2013-11-02 NOTE — Progress Notes (Signed)
See dictation

## 2013-11-15 ENCOUNTER — Other Ambulatory Visit (INDEPENDENT_AMBULATORY_CARE_PROVIDER_SITE_OTHER): Payer: Self-pay

## 2013-11-21 ENCOUNTER — Ambulatory Visit (HOSPITAL_COMMUNITY): Payer: Medicare Other

## 2013-11-21 ENCOUNTER — Encounter (HOSPITAL_COMMUNITY): Payer: Self-pay

## 2013-11-21 ENCOUNTER — Observation Stay
Admission: AD | Admit: 2013-11-21 | Discharge: 2013-11-22 | Disposition: A | Discharge status: Home / Self Care | Payer: Medicare Other | Source: Ambulatory Visit | Attending: Cardiovascular Disease | Admitting: Cardiovascular Disease

## 2013-11-21 ENCOUNTER — Observation Stay (HOSPITAL_COMMUNITY): Payer: Medicare Other

## 2013-11-21 DIAGNOSIS — I10 Essential (primary) hypertension: Secondary | ICD-10-CM | POA: Diagnosis present

## 2013-11-21 DIAGNOSIS — I5022 Chronic systolic (congestive) heart failure: Secondary | ICD-10-CM

## 2013-11-21 DIAGNOSIS — E1129 Type 2 diabetes mellitus with other diabetic kidney complication: Secondary | ICD-10-CM | POA: Diagnosis present

## 2013-11-21 DIAGNOSIS — Z95820 Peripheral vascular angioplasty status with implants and grafts: Secondary | ICD-10-CM | POA: Diagnosis present

## 2013-11-21 DIAGNOSIS — I2510 Atherosclerotic heart disease of native coronary artery without angina pectoris: Secondary | ICD-10-CM

## 2013-11-21 DIAGNOSIS — Z01810 Encounter for preprocedural cardiovascular examination: Secondary | ICD-10-CM

## 2013-11-21 DIAGNOSIS — I4891 Unspecified atrial fibrillation: Secondary | ICD-10-CM | POA: Diagnosis present

## 2013-11-21 DIAGNOSIS — I447 Left bundle-branch block, unspecified: Secondary | ICD-10-CM | POA: Insufficient documentation

## 2013-11-21 DIAGNOSIS — I509 Heart failure, unspecified: Secondary | ICD-10-CM | POA: Diagnosis present

## 2013-11-21 DIAGNOSIS — Z45018 Encounter for adjustment and management of other part of cardiac pacemaker: Secondary | ICD-10-CM

## 2013-11-21 DIAGNOSIS — Z9861 Coronary angioplasty status: Secondary | ICD-10-CM | POA: Insufficient documentation

## 2013-11-21 DIAGNOSIS — E119 Type 2 diabetes mellitus without complications: Secondary | ICD-10-CM

## 2013-11-21 DIAGNOSIS — F0390 Unspecified dementia without behavioral disturbance: Secondary | ICD-10-CM | POA: Insufficient documentation

## 2013-11-21 DIAGNOSIS — I255 Ischemic cardiomyopathy: Secondary | ICD-10-CM

## 2013-11-21 DIAGNOSIS — I428 Other cardiomyopathies: Secondary | ICD-10-CM | POA: Insufficient documentation

## 2013-11-21 DIAGNOSIS — Z4502 Encounter for adjustment and management of automatic implantable cardiac defibrillator: Secondary | ICD-10-CM

## 2013-11-21 LAB — CBC/DIFF
BASOPHILS: 1 %
BASOS ABS: 0.038 10*3/uL (ref 0.000–0.200)
EOS ABS: 0.096 THOU/uL (ref 0.000–0.500)
EOSINOPHIL: 1 %
HCT: 42.5 % (ref 36.7–47.0)
HGB: 13.8 g/dL (ref 12.5–16.3)
LYMPHOCYTES: 16 %
LYMPHS ABS: 1.213 THOU/uL (ref 1.000–4.800)
MCH: 29.2 pg (ref 27.4–33.0)
MCHC: 32.5 g/dL (ref 32.5–35.8)
MCV: 90 fL (ref 78–100)
MONOCYTES: 9 %
MONOS ABS: 0.648 THOU/uL (ref 0.300–1.000)
MPV: 9.3 fL (ref 7.5–11.5)
PLATELET COUNT: 138 THOU/uL — ABNORMAL LOW (ref 140–450)
PMN ABS: 5.402 THOU/uL (ref 1.500–7.700)
PMN'S: 73 %
RBC: 4.72 MIL/uL (ref 4.06–5.63)
RDW: 14.9 % (ref 12.0–15.0)
WBC: 7.4 THOU/uL (ref 3.5–11.0)

## 2013-11-21 LAB — BUN
BUN/CREAT RATIO: 26 — ABNORMAL HIGH (ref 6–22)
BUN: 38 mg/dL — ABNORMAL HIGH (ref 8–25)

## 2013-11-21 LAB — CREATININE: CREATININE: 1.45 mg/dL — ABNORMAL HIGH (ref 0.62–1.27)

## 2013-11-21 LAB — GLUCOSE, NON FASTING: GLUCOSE,NONFAST: 169 mg/dL — ABNORMAL HIGH (ref 65–139)

## 2013-11-21 LAB — ELECTROLYTES
ANION GAP: 11 mmol/L (ref 4–13)
CARBON DIOXIDE: 21 mmol/L — ABNORMAL LOW (ref 22–32)
CHLORIDE: 107 mmol/L (ref 96–111)
POTASSIUM: 4.4 mmol/L (ref 3.5–5.1)
SODIUM: 139 mmol/L (ref 136–145)

## 2013-11-21 LAB — PERFORM POC WHOLE BLOOD GLUCOSE: GLUCOSE, POINT OF CARE: 218 mg/dL — ABNORMAL HIGH (ref 70–105)

## 2013-11-21 LAB — PT/INR
INR: 2.13 — ABNORMAL HIGH (ref 0.80–1.20)
PROTHROMBIN TIME: 23.8 s — ABNORMAL HIGH (ref 8.7–13.2)

## 2013-11-21 LAB — PTT (PARTIAL THROMBOPLASTIN TIME): APTT: 45.1 s — ABNORMAL HIGH (ref 25.1–36.5)

## 2013-11-21 LAB — CREATININE WITH EGFR: ESTIMATED GLOMERULAR FILTRATION RATE: 51 ml/min/1.73m2 — ABNORMAL LOW (ref >59)

## 2013-11-21 MED ORDER — INSULIN LISPRO 100 UNIT/ML SUB-Q SSIP
2.00 [IU] | INJECTION | Freq: Four times a day (QID) | SUBCUTANEOUS | Status: DC | PRN
Start: 2013-11-21 — End: 2013-11-22
  Administered 2013-11-21: 4 [IU] via SUBCUTANEOUS
  Administered 2013-11-22: 2 [IU] via SUBCUTANEOUS
  Filled 2013-11-21: qty 3

## 2013-11-21 MED ORDER — SODIUM CHLORIDE 0.9 % INTRAVENOUS SOLUTION
INTRAVENOUS | Status: DC
Start: 2013-11-21 — End: 2013-11-22

## 2013-11-21 MED ORDER — SPIRONOLACTONE 25 MG TABLET
25.0000 mg | ORAL_TABLET | Freq: Every day | ORAL | Status: DC
Start: 2013-11-22 — End: 2013-11-22
  Administered 2013-11-22: 25 mg via ORAL
  Filled 2013-11-21: qty 1

## 2013-11-21 MED ORDER — MIDAZOLAM 1 MG/ML INJECTION SOLUTION
INTRAMUSCULAR | Status: AC
Start: 2013-11-21 — End: 2013-11-21
  Filled 2013-11-21: qty 18

## 2013-11-21 MED ORDER — FERROUS SULFATE 324 MG (65 MG IRON) TABLET,DELAYED RELEASE
324.0000 mg | DELAYED_RELEASE_TABLET | Freq: Every day | ORAL | Status: DC
Start: 2013-11-22 — End: 2013-11-22
  Administered 2013-11-22: 324 mg via ORAL
  Filled 2013-11-21: qty 1

## 2013-11-21 MED ORDER — AMIODARONE 200 MG TABLET
400.0000 mg | ORAL_TABLET | Freq: Every day | ORAL | Status: DC
Start: 2013-11-21 — End: 2013-11-22
  Administered 2013-11-21 – 2013-11-22 (×2): 400 mg via ORAL
  Filled 2013-11-21 (×2): qty 2

## 2013-11-21 MED ORDER — CEFAZOLIN 10 GRAM SOLUTION FOR INJECTION
2.00 g | INTRAMUSCULAR | Status: AC
Start: 2013-11-21 — End: 2013-11-21
  Administered 2013-11-21: 2 g via INTRAVENOUS
  Filled 2013-11-21: qty 20

## 2013-11-21 MED ORDER — FUROSEMIDE 40 MG TABLET
40.0000 mg | ORAL_TABLET | Freq: Every day | ORAL | Status: DC
Start: 2013-11-22 — End: 2013-11-22
  Administered 2013-11-22: 40 mg via ORAL
  Filled 2013-11-21: qty 1

## 2013-11-21 MED ORDER — FOLIC ACID 1 MG TABLET
1.0000 mg | ORAL_TABLET | Freq: Every day | ORAL | Status: DC
Start: 2013-11-22 — End: 2013-11-22
  Administered 2013-11-22 (×2): 1 mg via ORAL
  Filled 2013-11-21: qty 1

## 2013-11-21 MED ORDER — ACETAMINOPHEN 325 MG TABLET
650.00 mg | ORAL_TABLET | ORAL | Status: DC | PRN
Start: 2013-11-21 — End: 2013-11-22

## 2013-11-21 MED ORDER — LIDOCAINE HCL 20 MG/ML (2 %) INJECTION SOLUTION
INTRAMUSCULAR | Status: AC
Start: 2013-11-21 — End: 2013-11-21
  Filled 2013-11-21: qty 40

## 2013-11-21 MED ORDER — LIDOCAINE HCL 20 MG/ML (2 %) INJECTION SOLUTION
10.00 mL | INTRAMUSCULAR | Status: AC
Start: 2013-11-21 — End: 2013-11-21
  Administered 2013-11-21: 200 mg via INTRADERMAL

## 2013-11-21 MED ORDER — DIGOXIN 125 MCG (0.125 MG) TABLET
125.0000 ug | ORAL_TABLET | Freq: Every day | ORAL | Status: DC
Start: 2013-11-22 — End: 2013-11-22
  Administered 2013-11-22: 125 ug via ORAL
  Filled 2013-11-21: qty 1

## 2013-11-21 MED ORDER — LOSARTAN 50 MG TABLET
50.00 mg | ORAL_TABLET | Freq: Two times a day (BID) | ORAL | Status: DC
Start: 2013-11-21 — End: 2013-11-22
  Administered 2013-11-21 – 2013-11-22 (×3): 50 mg via ORAL
  Filled 2013-11-21 (×3): qty 1

## 2013-11-21 MED ORDER — CARVEDILOL 25 MG TABLET
25.00 mg | ORAL_TABLET | Freq: Two times a day (BID) | ORAL | Status: DC
Start: 2013-11-21 — End: 2013-11-22
  Administered 2013-11-21 – 2013-11-22 (×2): 25 mg via ORAL
  Filled 2013-11-21 (×3): qty 1

## 2013-11-21 MED ORDER — SODIUM CHLORIDE 0.9 % IRRIGATION SOLUTION
Status: AC
Start: 2013-11-21 — End: 2013-11-21
  Filled 2013-11-21: qty 10

## 2013-11-21 MED ORDER — APIXABAN 5 MG TABLET
5.00 mg | ORAL_TABLET | Freq: Two times a day (BID) | ORAL | Status: DC
Start: 2013-11-21 — End: 2013-11-22
  Administered 2013-11-21 – 2013-11-22 (×2): 5 mg via ORAL
  Filled 2013-11-21 (×3): qty 1

## 2013-11-21 MED ORDER — FENTANYL (PF) 50 MCG/ML INJECTION SOLUTION
12.50 ug | INTRAMUSCULAR | Status: AC | PRN
Start: 2013-11-21 — End: 2013-11-21
  Administered 2013-11-21: 25 ug via INTRAVENOUS

## 2013-11-21 MED ORDER — GLIPIZIDE 5 MG TABLET
5.00 mg | ORAL_TABLET | Freq: Every morning | ORAL | Status: DC
Start: 2013-11-22 — End: 2013-11-22
  Administered 2013-11-22: 5 mg via ORAL
  Filled 2013-11-21: qty 1

## 2013-11-21 MED ORDER — MIDAZOLAM 1 MG/ML INJECTION SOLUTION
0.50 mg | INTRAMUSCULAR | Status: AC | PRN
Start: 2013-11-21 — End: 2013-11-21
  Administered 2013-11-21: 1 mg via INTRAVENOUS
  Administered 2013-11-21: 2 mg via INTRAVENOUS
  Administered 2013-11-21 (×3): 1 mg via INTRAVENOUS

## 2013-11-21 MED ORDER — HYDROCODONE 5 MG-ACETAMINOPHEN 325 MG TABLET
1.0000 | ORAL_TABLET | ORAL | Status: DC | PRN
Start: 2013-11-21 — End: 2013-11-22
  Administered 2013-11-22: 1 via ORAL
  Filled 2013-11-21: qty 1

## 2013-11-21 MED ORDER — NITROGLYCERIN 0.4 MG SUBLINGUAL TABLET
0.40 mg | SUBLINGUAL_TABLET | SUBLINGUAL | Status: DC | PRN
Start: 2013-11-21 — End: 2013-11-22

## 2013-11-21 MED ORDER — FENTANYL (PF) 50 MCG/ML INJECTION SOLUTION
INTRAMUSCULAR | Status: AC
Start: 2013-11-21 — End: 2013-11-21
  Filled 2013-11-21: qty 4

## 2013-11-21 NOTE — Progress Notes (Signed)
Patient on Eliquis not coumadin.  Will continue Eliquis.

## 2013-11-21 NOTE — Progress Notes (Signed)
Biventricular ICD implanted and test.  Atrial fibrillation cardioversion.  No complications noted.    Will continue anticoagulation.  Start amiodarone 400 mg PO daily.  Observe overnight for LV lead stability.  Wound check and staple removal will be coordinated in [place] office in 1 week.

## 2013-11-21 NOTE — Nurses Notes (Signed)
Arrived to OBS 3 ambulatory with steady gait.  VSS 0 pain or discomfort noted.  Oriented to unit, bed locked and in the lowest position.  Family at bedside.

## 2013-11-21 NOTE — Care Plan (Signed)
Problem: General Plan of Care(Adult,OB)  Goal: Plan of Care Review(Adult,OB)  The patient and/or their representative will communicate an understanding of their plan of care   Outcome: Ongoing (see interventions/notes)  Pt had BiV pacer inserted today.  Continue to monitor site, verify placement in the AM.    Discharge Plan:  Home (Patient/Family Member/other) (code 1)    Discharge possible in the AM.  Pt is independent at home with ADLs and does not use any DME.  No needs identified at this time.  Will continue to follow with progression to discharge.    The patient will continue to be evaluated for developing discharge needs.     Case Manager: Valora Corporal, CLINICAL CAR 11/21/2013, 13:59  Phone: [phone]

## 2013-11-21 NOTE — Care Management Notes (Signed)
[facility]  Care Management Initial Evaluation    Patient Name: [name]  Date of Birth:   Sex: male  Date/Time of Admission: 11/21/2013  9:11 AM  Room/Bed: 1021/A  Payor: MEDICARE / Plan: MEDICARE PART A AND B / Product Type: Medicare /   PCP: Nehemiah SettleH Ariel Valentine, MD    Pharmacy Info:   Preferred Pharmacy    [facility] [address] - [place], [place] - [address]    [address] [place] [id number]    Phone: [phone] Fax: [phone]    Open 24 Hours?: No    [facility] [place], [place] [address]    [address] [place] [id number]    Phone: [phone] Fax: [phone]    Open 24 Hours?: No        Emergency Contact Info:   Extended Emergency Contact Information  Primary Emergency Contact: Lolita LenzSNEDEGAR, Jim   [place]  Home Phone: [phone]  Relation: Sister  Secondary Emergency Contact: Jeralyn BennettOOPER, CHERYL SUE  Address: [address]           [place], [place] [id number] [place]  Home Phone: [phone]  Relation: Significant other    History:   Jim MainlandWilliam N [name] is a 73 y.o., male, admitted scheduled BiV pacer insertion.    Height/Weight: 182.9 cm (6') / 87.544 kg (193 lb)     LOS: 0 days   Admitting Diagnosis: LBBB  CHF (NYHA CLASS II, ACC/AHA STAGE C)    Assessment:      11/21/13 1355   Assessment Details   Assessment Type Admission   Date of Care Management Update 11/21/13   Date of Next DCP Update 11/24/13   Care Management Plan   Discharge Planning Status initial meeting   Projected Discharge Date 11/22/13   CM will evaluate for rehabilitation potential yes   Patient choice offered to patient/family no   Form for patient choice reviewed/signed and on chart no   Patient aware of possible cost for ambulance transport?  No   Discharge Needs Assessment   Outpatient/Agency/Support Group Needs none   Equipment Currently Used at Home none   Equipment Needed After Discharge none   Discharge Facility/Level of Care Needs Home (Patient/Family  Member/other)(code 1)   Transportation Available car;family or friend will provide   Referral Information   Admission Type inpatient   Address Verified verified-no changes   Arrived From home   Insurance Verified verified-no change   ADVANCE DIRECTIVES   !! Does the Patient have an Advance Directive? Yes, Patient Does Have Advance Directive for Healthcare Treatment   Type of Advance Directive Completed Medical Living Will;Medical Power of Attorney   Copy of Advance Directives in Chart? 0   Name of MPOA or Healthcare Surrogate [name]   Phone Number of MPOA or Healthcare Surrogate [phone]   Patient Requests Assistance in Having Advance Directive Notarized. N/A   Employment/Financial   Patient has Prescription Coverage?  Yes       Name of Insurance Coverage for Medications [employer]   Mutuality/Individual Preferences    !!What Anxieties, Fears or Concerns Do You Have About Your Health or Care? None   !!What Questions Do You Have About Your Health or Care? None   !!What Information Would Help [place] Give You More Personalized Care? None   Living Environment   Lives With alone   Living Arrangements apartment   Able to Return to Prior  Living Arrangements yes   Home Safety   Home Assessment: No Problems Identified   Home Accessibility no concerns;stairs to enter home   Living Environment   Number of Stairs to Enter Home 24     Pt had BiV pacer inserted today.  Continue to monitor site, verify placement in the AM.    Discharge Plan:  Home (Patient/Family Member/other) (code 1)    Discharge possible in the AM.  Pt is independent at home with ADLs and does not use any DME.  No needs identified at this time.  Will continue to follow with progression to discharge.    The patient will continue to be evaluated for developing discharge needs.     Case Manager: Valora Corporal, CLINICAL CAR 11/21/2013, 13:59  Phone: [phone]

## 2013-11-21 NOTE — H&P (Addendum)
[facility]                                                     H&P Update Form    Shayan, Kowitz, 73 y.o. male  Date of Admission:  11/21/2013  Date of Birth:      11/21/2013    STOP: IF H&P IS GREATER THAN 30 DAYS FROM SURGICAL DAY COMPLETE NEW H&P IS REQUIRED.    Outpatient Pre-Surgical H & P updated the day of the procedure.  1.  H&P completed within 30 days of surgical procedure was performed by Dr. Rennie Plowman on 11/02/13 and has been reviewed, the patient has been examined, and no change has occured in the patients condition since the H&P was completed.       Change in medications: No/      Last Menstrual Period: Not applicable      Comments:     2.  Patient continues to be appropiate candidate for planned surgical procedure. YES      Annye English, MD      I have seen and examined the patient and agree with above.  Patient has no new symptoms.  He is on all 3 study-proven classes of medications.  He has NYHA class II symptoms.  His lungs are clear.  IRRR without S3 gallop.  Prolonged QRS.    Patient here for biventricular pacing system. If SR can be restored will deploy atrial lead.

## 2013-11-22 ENCOUNTER — Encounter (INDEPENDENT_AMBULATORY_CARE_PROVIDER_SITE_OTHER): Payer: Medicare Other | Admitting: Internal Medicine

## 2013-11-22 ENCOUNTER — Observation Stay (HOSPITAL_BASED_OUTPATIENT_CLINIC_OR_DEPARTMENT_OTHER): Payer: Medicare Other

## 2013-11-22 DIAGNOSIS — J9819 Other pulmonary collapse: Secondary | ICD-10-CM

## 2013-11-22 DIAGNOSIS — I509 Heart failure, unspecified: Secondary | ICD-10-CM

## 2013-11-22 DIAGNOSIS — J90 Pleural effusion, not elsewhere classified: Secondary | ICD-10-CM

## 2013-11-22 DIAGNOSIS — J811 Chronic pulmonary edema: Secondary | ICD-10-CM

## 2013-11-22 DIAGNOSIS — I517 Cardiomegaly: Secondary | ICD-10-CM

## 2013-11-22 DIAGNOSIS — I4891 Unspecified atrial fibrillation: Secondary | ICD-10-CM

## 2013-11-22 LAB — PERFORM POC WHOLE BLOOD GLUCOSE
GLUCOSE, POINT OF CARE: 131 mg/dL — ABNORMAL HIGH (ref 70–105)
GLUCOSE, POINT OF CARE: 185 mg/dL — ABNORMAL HIGH (ref 70–105)

## 2013-11-22 LAB — H & H
HCT: 44.7 % (ref 36.7–47.0)
HGB: 14.8 g/dL (ref 12.5–16.3)

## 2013-11-22 LAB — HGA1C (HEMOGLOBIN A1C WITH EST AVG GLUCOSE)
ESTIMATED AVERAGE GLUCOSE: 169 mg/dL
HEMOGLOBIN A1C: 7.5 % — ABNORMAL HIGH (ref 4.0–6.0)

## 2013-11-22 MED ORDER — FUROSEMIDE 10 MG/ML INJECTION SOLUTION
60.0000 mg | Freq: Once | INTRAMUSCULAR | Status: AC
Start: 2013-11-22 — End: 2013-11-22
  Administered 2013-11-22: 60 mg via INTRAVENOUS
  Filled 2013-11-22: qty 6

## 2013-11-22 MED ORDER — AMIODARONE 400 MG TABLET
400.0000 mg | ORAL_TABLET | Freq: Every day | ORAL | Status: DC
Start: 2013-11-22 — End: 2014-01-16

## 2013-11-22 MED ORDER — FUROSEMIDE 40 MG TABLET
80.0000 mg | ORAL_TABLET | Freq: Every day | ORAL | Status: DC
Start: 2013-11-22 — End: 2013-11-29

## 2013-11-22 MED ADMIN — sodium chloride 0.9 % (flush) injection syringe: ORAL | @ 08:00:00

## 2013-11-22 MED ADMIN — ofloxacin 0.3 % ear drops: ORAL | @ 02:00:00 | NDC 24208041005

## 2013-11-22 NOTE — Ancillary Notes (Signed)
[place]   Met with [name] to assess diabetes education needs.  Sister at bedside.  Sister reports that he takes Metformin 519m  Extended release twice daily and 5070mMetformin twice daily in addition to glipizide 2.80m83maily.  He follows with H AMaida SaleD General diabetes issues discussed.    SarIhor Dow, LD, CDEDoyleiabetes Education Center  Phone: [phone] #: [phone]

## 2013-11-22 NOTE — Pharmacy (Signed)
[facility]  Discharge Pharmacy Service  Initial Note    Patient: [name], [name]  Date of Birth:   MRN: [id number]  Room/Bed: 1021/A  Service: Cardiology 2    Date of Service: 11/22/2013    Allergies   Allergen Reactions    Lipitor [Atorvastatin]      Concerned about cognitive function.    Pravastatin Rash       Reason for Visit:    Initiate Discharge Pharmacy Services:  Declined  Freedom of Choice Offerings: Accepted  Review of Patient's Medication List: Accepted    Conclusion:     Consulted patient about the Discharge Pharmacy Service and patient was not interested at this time.  If patient reconsiders service, please contact the discharge pharmacy at [phone] or fax prescription orders to [phone].     [place] Simcha Farrington, CPHT 11/22/2013, 10:03

## 2013-11-22 NOTE — Care Plan (Signed)
Problem: General Plan of Care(Adult,OB)  Goal: Individualization/Patient Specific Goal(Adult/OB)  Outcome: Adequate for Discharge Date Met:  11/22/13  Goal: Plan of Care Review(Adult,OB)  The patient and/or their representative will communicate an understanding of their plan of care   Outcome: Adequate for Discharge Date Met:  11/22/13  Patient given discharge instructions as directed by MD and handouts on all new medications. Patient and family member verbalize understanding of all discharge instructions and follow up appointments. No questions at this time from patient. Will continue with discharge. Patient stable at time of discharge. Pt given information about his ICD and instructions on post hospital care.  Pt arm remains in a sling as a reminder.

## 2013-11-22 NOTE — Progress Notes (Signed)
Mild dyspnea.  Afebrile.  Site fine.  Lungs reduced inspiratory excursion. Basilar crackles.  RRR without audible S3.  Telemtry- sinus rhythm with P-synchronous pacing.  CXR- pulmonary edema. Leads in good position.  ICD interrogation shows excellent lead function.    Volume overload- will give iv lasix and follow output and symptoms. If responsive, will DC later today on double dose of oral lasix with K replacement.  Check K Thursday.  Continue amiodarone at 400 mg daily for 1 month then reducing dose to 200 mg daily.  Wound check in [place] office in 1 week.  Follow up with me in 1 month to oversee amiodarone administration and maintenance of sinus rhythm.

## 2013-11-22 NOTE — Nurses Notes (Signed)
NP notified of increased pain, tenderness and swelling around pacer site. Pacer site marked. H/H drawn and stable. Will continue to check site Q1 hour for any changes.

## 2013-11-22 NOTE — Care Management Notes (Signed)
Care Coordinator/Social Work Plan  [place]  Patient Name: [name]   MRN: [id number]   Acct Number: [id number]  DOB:  Age: 73  **Admission Information**  Patient Type: OBSERVATION  Admit Date: 11/21/2013 Admit Time: 09:11  Admit Reason: LBBB  Admitting Phys: Chanetta Marshall  Attending Phys: Gean Quint B  Unit: CSDW Bed: 1021-A  160. LOC Notification, CMS Important Message / Detailed Notice  Created by : Valora Corporal Date/Time 2013-11-22 11:52:18.000  Medical Necessity and Level Of Care Notification  Level of Care Notification Patient notified of Level of Care status change to Observation (Date / Time) Observation  Letter given to patient today 5/12 at 1143, pt understood and had not further questions.

## 2013-11-22 NOTE — Care Management Notes (Signed)
Utilization Review Determination    SECTION I  Reason for Physician Advisor Referral: Does not meet inpatient criteria. 11/21/2013    Current MERLIN MD Order: INPATIENT    Utilization Review Findings: Patient meets observation status.  Utilization Review MD: Dr. Silvio Clayman    Based on clinical information available to me as per above and in chart, the patient's status should be: Observation    Para Skeans, RN 11/22/2013, 13:05  -------------------------------------------------------------------------------------------------------------------  1.  I have reviewed this case with the patient's treating physician with Dr. Chestine Spore and service, and the MD agrees with this change in status.  They are aware of the referral to the Utilization Review Committee.    2.  The patient will be notified by Crystall Morris on 11/22/2013 of changes in level of care.  Please refer to the education documentation and/or [employer]scripts for specific time of delivery.    Para Skeans, RN 11/22/2013, 13:05  (Patient notification is required only if the status is changed while an Inpatient to Observation Status)  -------------------------------------------------------------------------------------------------------------------

## 2013-11-22 NOTE — Discharge Summary (Addendum)
DISCHARGE SUMMARY      PATIENT NAMESathvik, [name]  MRN:  [id number]  DOB:      ADMISSION DATE:  11/21/2013  DISCHARGE DATE:  11/22/2013    ATTENDING PHYSICIAN: No att. providers found  PRIMARY CARE PHYSICIAN: Nehemiah Settle, MD     DISCHARGE DIAGNOSIS:   Principle Problem: <principal problem not specified>  There are no hospital problems to display for this patient.    Active Non-Hospital Problems    Diagnosis Date Noted    Ischemic cardiomyopathy 10/24/2013    CHF (NYHA class II, ACC/AHA stage C) 10/24/2013    Atrial fibrillation 08/25/2013    Congestive heart failure with LV diastolic dysfunction, NYHA class 1 06/29/2012    PAF (paroxysmal atrial fibrillation) 11/05/2011    CAD (coronary artery disease) 09/25/2011    S/P angioplasty with stent 09/25/2011    Altered mental status 09/04/2011    Elevated troponin 08/29/2011    HTN (hypertension) 08/29/2011    Diabetes mellitus 08/29/2011    LBBB (left bundle branch block) 08/28/2011      DISCHARGE MEDICATIONS:     Current Discharge Medication List      START taking these medications.      Details    Amiodarone 400 mg Tablet   Commonly known as:  PACERONE    400 mg, Oral, DAILY   Qty:  30 Tab   Refills:  1         CONTINUE these medications which have CHANGED during your visit.      Details    furosemide 40 mg Tablet   Commonly known as:  LASIX   What changed:  how much to take    80 mg, Oral, DAILY, Takes extra tab when more edema   Qty:  60 Tab   Refills:  11         CONTINUE these medications - NO CHANGES were made during your visit.      Details    apixaban 5 mg Tablet   Commonly known as:  ELIQUIS    5 mg, Oral, 2 TIMES DAILY   Refills:  0       ascorbic acid 500 mg Tablet   Commonly known as:  VITAMIN C    500 mg, Oral, DAILY   Refills:  0       carvedilol 25 mg Tablet   Commonly known as:  COREG    25 mg, Oral, 2 TIMES DAILY WITH FOOD   Refills:  0       digoxin 125 mcg Tablet   Commonly known as:  LANOXIN    0.125 mg, Oral, DAILY      Refills:  0       ferrous sulfate 324 mg (65 mg iron) Tablet, Delayed Release (E.C.)   Commonly known as:  FERATAB    324 mg, Oral, DAILY   Refills:  0       folic acid 1 mg Tablet   Commonly known as:  FOLVITE    1 mg, Oral, DAILY   Refills:  0       glipiZIDE 5 mg Tablet   Commonly known as:  GLUCOTROL    5 mg, Oral, EVERY MORNING BEFORE BREAKFAST, Take 30 minutes before meals    Refills:  0       losartan 50 mg Tablet   Commonly known as:  COZAAR    50 mg, Oral, 2 TIMES DAILY   Refills:  0  metFORMIN 500 mg Tablet   Commonly known as:  GLUCOPHAGE    500 mg, Oral, 2 TIMES DAILY WITH FOOD   Refills:  0       multivitamin Tablet    1 Tab, Oral, DAILY   Refills:  0       nitroglycerin 0.4 mg Tablet, Sublingual   Commonly known as:  NITROSTAT    0.4 mg, Sublingual, EVERY 5 MIN PRN, for 3 doses over 15 minutes   Qty:  30 Tab   Refills:  5       spironolactone 25 mg Tablet   Commonly known as:  ALDACTONE    25 mg, Oral, DAILY   Qty:  90 Tab   Refills:  3           DISCHARGE INSTRUCTIONS:        Follow-up Information    Follow up with Nehemiah SettleValentine, H Ariel, MD In 2 weeks.    Specialty:  EXTERNAL    Contact information:    [address]  [place] [id number]  [phone]          Follow up In 1 week.        Follow up with [facility] .    Specialty:  Cardiology    Contact information:    [address]  [place] [id number]  [phone]        Follow [facility] with [facility] .    Specialty:  Cardiology    Contact information:    [address]  [place] [id number]  [phone]          BASIC METABOLIC PANEL, NON-FASTING     MISCELLANEOUS INSTRUCTIONS TO PATIENT   Please have blood work done on Thursday and fax results.    Wound check/staple removal at [place] office in one week    Follow up with Dr. Rennie PlowmanHull in 1 month.     SCHEDULE FOLLOW-UP - [facility]   Reason for visit: HOSPITAL DISCHARGE    Followup reason: wound check/staple removal    Provider: [facility]    Follow-up in: 1 WEEK      SCHEDULE FOLLOW-UP - [facility]   Reason for visit: HOSPITAL DISCHARGE    Followup reason: s/p BiV ICD placement    Provider: Jackquline Boschobert Hull, MD    Follow-up in: 4 WEEKS         REASON FOR HOSPITALIZATION AND HOSPITAL COURSE:  This is a 73 y.o., male with a history of CAD, ICM (EF 15-20%), CHF NYHA class II symptoms, Afib, HTN, diabetes, and LBBB who presented for BiV ICD placement.  On 11/21/13, patient underwent BiV ICD implantation and Afib cardioversion.  The patient was admitted for observation overnight.  He did complain of some appropriate tenderness at the site, however there was no hematoma present.  He also had some mild dyspnea the day following the procedure and a chest xray showed a pulmonary edema pattern.  He was give a dose of 60 mg IV lasix with which he diuresed approximately 2L.  His home dose of lasix was increased to 80 mg daily.  He was also prescribed amiodarone 400mg  daily for one month, until he returns to dr. Rennie PlowmanHull, who will further manage his amiodarone therapy.  He will return to [facility] in [place] in one week for wound check and staple removal.  He will also have his potassium rechecked this Thursday.  He will follow up  with Dr. Rennie Plowman in one month.    CONDITION ON DISCHARGE:  A. Ambulation: Full ambulation  B. Self-care Ability: Complete  C. Cognitive Status Alert and Oriented x 3    DISCHARGE DISPOSITION:  Home discharge       Jim [name], Jim [name]  11/22/2013, 15:14        Copies sent to Care Team      Relationship Specialty Notifications Start End    Nehemiah Settle PCP - General EXTERNAL  11/21/13     Phone: [phone] Fax: [phone]         [address] [place] [address]    Nehemiah Settle PCP - Secondary Care EXTERNAL  11/11/13     Phone: [phone] Fax: [phone]         [address] [place] [address]          Referring providers can utilize [email or web address] to access their referred [employer] patient's  information.              I personally saw the patient. See mid-level's note for additional details. My findings/particpation are: single S1, S2.    Gean Quint, MD 11/22/2013, 17:10

## 2013-11-22 NOTE — Progress Notes (Addendum)
[facility]   Cardiology   Progress Note      [name],[name]  Date of Admission:  11/21/2013  Date of service: 11/22/2013  Date of Birth:    MRN:  [id number]    Hospital Day:  LOS: 1 day   Subjective: complaints of mild dyspnea this morning.  Appropriate tenderness at device site.   Site within normal limits    Objective:     Vital Signs:  Temp (24hrs) Max:37 C (98.6 F)      Temperature: 36.8 C (98.2 F) (11/22/13 0758)  BP (Non-Invasive): 136/93 mmHg (11/22/13 0758)  MAP (Non-Invasive): 103 mmHG (11/22/13 0758)  Heart Rate: 92 (11/22/13 0758)  Respiratory Rate: 16 (11/22/13 0758)  Pain Score (Numeric, Faces): 0 (11/22/13 0758)  SpO2-1: 95 % (11/22/13 0758)  Physical Exam:  General: no acute distress and alert  Lungs: rales bibasilar  Cardiovascular:    Heart regular rate and rhythm without murmer  Extremities: No edema or cyanosis  Skin: left chest incision ecchymotic, no active bleeding or hematoma  Psychiatric: AOx3    I/O:  I/O last 24 hours:      Intake/Output Summary (Last 24 hours) at 11/22/13 1452  Last data filed at 11/22/13 1300   Gross per 24 hour   Intake    720 ml   Output   2925 ml   Net  -2205 ml     I/O current shift:  05/12 0800 - 05/12 1559  In: 350 [P.O.:350]  Out: 2275 [Urine:2275]  Blood Sugars:   Last Fingerstick:    Lab Results   Component Value Date    GLUCOSEPOC 185* 11/22/2013       Labs:  BMP:   No results found for this encounter  EF:  15-20% via Echo at outside facility 09/16/13    Current Medications:    Current Facility-Administered Medications:  acetaminophen (TYLENOL) tablet 650 mg Oral Q4H PRN   amiodarone (CORDARONE) tablet 400 mg Oral Daily   apixaban (ELIQUIS) tablet 5 mg Oral 2x/day   carvedilol (COREG) tablet 25 mg Oral 2x/day-Food   digoxin (LANOXIN) tablet 125 mcg Oral Daily   ferrous sulfate 324 mg (65 mg elemental IRON) tab 324 mg Oral Daily   folic acid (FOLVITE) tablet 1 mg Oral Daily   furosemide (LASIX) tablet 40 mg Oral Daily      glipiZIDE (GLUCOTROL) tablet 5 mg Oral Daily before Breakfast   HYDROcodone-acetaminophen (NORCO) 5-325 mg per tablet 1 Tab Oral Q4H PRN   losartan (COZAAR) tablet 50 mg Oral 2x/day   nitroglycerin (NITROSTAT) sublingual tablet 0.4 mg Sublingual Q5 Min PRN   NS premix infusion  Intravenous Continuous   spironolactone (ALDACTONE) tablet 25 mg Oral Daily   SSIP insulin lispro (HUMALOG) 100 units/mL injection 2-6 Units Subcutaneous 4x/day PRN       Allergies   Allergen Reactions    Lipitor [Atorvastatin]      Concerned about cognitive function.    Pravastatin Rash         Assessment/ Plan:  Active Hospital Problems    Diagnosis    Primary Problem: Ischemic cardiomyopathy    CHF (NYHA class II, ACC/AHA stage C)    Atrial fibrillation    CAD (coronary artery disease)    S/P angioplasty with stent    HTN (hypertension)    Diabetes mellitus     ASA:  no  Beta Blocker:  yes  ACEI/ARB:  yes  Statin:  no  Plavix: no  Spironolactone Indicated (  Heart Failure): yes  DVT/PE Prophylaxis: eliquis     1. ICM/CHF   S/p BiV ICD yesterday   Site WNL   Continue cozaar 50 mg daily   Continue coreg 25 mg bid   Continue digoxin daily   Continue aldactone 25 mg daily   Some increased dyspnea today   CXR with pulmonary edema pattern   Will give 60 mg IV lasix now   Increase daily dose of lasix to 80 mg daily for discharge   If diurese well this am, will d/c later today  2.atrial fibrillation   Sinus rhythm today   Continue amiodarone 400mg  daily x 1 month    continue Eliquis 5 mg bid  3. HTN   Well controlled   Continue current antihypertensive regimen  4. CAD   Stable and asymptomatic   Continue above medication regimen as above  5. DM2   ssip   FS ac/hs      Disposition Planning: Home discharge      Silverio Lay, APRN 11/22/2013, 14:52          I personally saw the patient. See mid-level's note for additional details. My findings/particpation are: single S1, S2.    Gean Quint, MD 11/22/2013, 17:10

## 2013-11-22 NOTE — Procedures (Signed)
[facility]                                    SECTION OF CARDIOLOGY                               CARDIAC DIAGNOSTIC LABORATORIES                                     [phone]    Department of Medicine      School of Medicine  Section of Cardiology      [facility]                            CARDIAC CATHETERIZATION PROCEDURE REPORT    PATIENT NAME:  Jim MainlandSNEDEGAR, Roderick N   HOSPITAL NUMBER:[id number]  DATE OF BIRTH:   PROCEDURE DATE: 11/21/2013    NAME OF PROCEDURE:  Dual chamber biventricular defibrillator implantation and testing.    PHYSICIAN:  Jackquline Boschobert Caragh Gasper, MD.    REFERRING PHYSICIAN:  Vincent Groshalak Berzingi, MD.      INDICATIONS FOR PROCEDURE:  The patient is a 73 year old gentleman with an ischemic cardiomyopathy and an ejection fraction of 10-15% after more than 3 months post coronary revascularization.  He is on a study-proven regimen that now includes beta blockade, angiotensin receptor blockade and low-dose aldosterone.  He has [place] Heart Association class II symptoms with a prolonged QRS exceeding 240 milliseconds with left bundle branch block morphology.  The patient elected to pursue defibrillator implantation over biventricular pacing in the interim timeframe from evaluation for this procedure and presentation.    DESCRIPTION OF PROCEDURE:  The patient received prophylactic antibiotics in the laboratory.  He was prepared and draped in the usual sterile fashion.  After anesthesia with 1% lidocaine locally, a 6-cm incision was made parallel to the left clavicle.  Utilizing blunt and Bovie dissection, the prepectoralis fascia was identified and a pocket formed superior to this.    Using the modified Seldinger technique, 3 guidewires were placed in the left subclavian vein.  Over the first guidewire, a sheath was introduced and the guidewire removed.  This lead was placed in the right ventricular apex.  After documenting appropriate  sensing and capture thresholds, the lead was secured to the pectoralis floor with 2 Ethibond sutures around the suture sleeve.    At this point, with the patient sedated, a 200 joule synchronous transthoracic shock was undertaken.  A sinus mechanism was restored.  In the short-term, this was maintained.  An atrial lead was therefore placed.  Over the second guidewire, a sheath was advanced and the guidewire removed.  Through this, the atrial lead was passed and the sheath torn away.  The lead was actively fixed in the high lateral right atrial wall.  After documenting appropriate sensing and capture thresholds, this lead was secured with 2 Ethibond sutures around the suture sleeve.    Over the remaining guidewire, a sheath was advanced and that guidewire removed.  Through this, a long St. Jude sheath was advanced over a guidewire and introducer.  With a deflectable quadripolar electrode catheter, the great cardiac vein was cannulated.  Once the sheath was advanced, a venogram revealed a large marginal branch.  With a  0.014-inch guidewire, the lead was advanced to a stable position.  After documenting appropriate capture thresholds, the sheath was removed and the safe sheath was removed.  This lead was secured with 2 Ethibond sutures around the suture sleeve.    The pocket was irrigated with copious amounts of antibiotic solution.  To maintain hemostasis around the access sites, Surgicel foam was placed.  The leads were connected to the generator and the generator placed within the pocket.    Through the device, ventricular fibrillation was induced.  The device appropriately recognized ventricular fibrillation and defibrillated the patient with a 25-joule delivery.  The pocket was then closed with a running suture of 2-0 Vicryl and the skin was closed with staples.    COMPLICATIONS:  None.    IMPLANT DATA:  The patient received a St. Jude Medical [id number] device, serial # [id number].    The atrial lead was a Transport planner [id number] lead, serial # [id number].  A waves were 4.3 millivolts.  The lead impedance was 436 ohms.  The capture threshold was 0.7 volts at 0.5 milliseconds.    The right ventricular lead was a Designer, jewellery [id number] lead, serial # [id number].  R waves were 6.5 millivolts.  The lead impedance was 508 ohms with a high voltage impedance of 40 ohms.  The capture threshold was 0.9 volts at 0.5 milliseconds.    The left ventricular lead was a Designer, jewellery [id number] lead, serial # [id number].  The lead impedance was 590 ohms.  The capture threshold was 0.75 volts at 0.5 milliseconds.  There was diaphragmatic pacing only at 10 volt output.    PLAN:  The device is programmed to provide DDD pacing between 70-120 ppm.  This is currently programmed to be a single zone device detecting ventricular fibrillation as rates exceeding 200 beats per minute and delivering high output therapies.    We will initiate oral amiodarone to attempt to maintain a sinus mechanism.      Jackquline Bosch, MD  Associate Professor, Section of Cardiology  [facility] Department of Medicine    [id number]; D: 11/21/2013 12:45:06; T: 11/22/2013 00:54:29    cc: Linward Natal MD      Shirleen Schirmer

## 2013-11-26 ENCOUNTER — Ambulatory Visit (HOSPITAL_BASED_OUTPATIENT_CLINIC_OR_DEPARTMENT_OTHER): Payer: Self-pay | Admitting: Student in an Organized Health Care Education/Training Program

## 2013-11-26 NOTE — Telephone Encounter (Signed)
Patient has been complaining of light headedness and dizziness since yesterday. His dose of lasix has been increased to 80 mg daily since the implantation of the BiV ICD. Patient denies any chest pain.   Advised sister to cut down on lasix dose to 40 mg daily and to cut down on the dose of Losartan to 50 mg daily from 50 BID. If symptoms persists despite the above and despite encouraging gentle hydration he should seek medical attention.  Patient has a follow up in [place] office on Tuesday, if symptoms again persists or gets worse advised patient to get checked in a local emergency department.   Sister mention there is mild swelling around the sit of BiV ICD but not blood is visible.     Channon Brougher Trenda Moots, MD  11/26/2013, 17:08

## 2013-11-26 NOTE — Telephone Encounter (Signed)
-----   Message from Elba Barman Truly, RN STUDENT sent at 11/26/2013  5:01 PM EDT -----  >> Elba Barman TRULY, RN STUDENT 11/26/2013 05:01 PM  The sister of patient Lakeem Starzyk MRN: [id number] called requesting to speak to on call doctor about brothers incision site where pace maker was placed and patient has low blood pressure and is light headed. Paged and connected with Dr. Katheren Puller for consult. -KT

## 2013-11-28 ENCOUNTER — Other Ambulatory Visit (INDEPENDENT_AMBULATORY_CARE_PROVIDER_SITE_OTHER): Payer: Self-pay

## 2013-11-29 ENCOUNTER — Ambulatory Visit (INDEPENDENT_AMBULATORY_CARE_PROVIDER_SITE_OTHER): Payer: Medicare Other

## 2013-11-29 VITALS — BP 82/54 | HR 76 | Resp 18 | Ht 72.0 in | Wt 180.6 lb

## 2013-11-29 DIAGNOSIS — I10 Essential (primary) hypertension: Secondary | ICD-10-CM

## 2013-11-29 DIAGNOSIS — I447 Left bundle-branch block, unspecified: Secondary | ICD-10-CM

## 2013-11-29 DIAGNOSIS — I480 Paroxysmal atrial fibrillation: Secondary | ICD-10-CM

## 2013-11-29 DIAGNOSIS — I959 Hypotension, unspecified: Principal | ICD-10-CM

## 2013-11-29 DIAGNOSIS — I255 Ischemic cardiomyopathy: Secondary | ICD-10-CM

## 2013-11-29 DIAGNOSIS — E119 Type 2 diabetes mellitus without complications: Secondary | ICD-10-CM

## 2013-11-29 DIAGNOSIS — I5030 Unspecified diastolic (congestive) heart failure: Secondary | ICD-10-CM

## 2013-11-29 DIAGNOSIS — I509 Heart failure, unspecified: Secondary | ICD-10-CM

## 2013-11-29 DIAGNOSIS — I2589 Other forms of chronic ischemic heart disease: Secondary | ICD-10-CM

## 2013-11-29 DIAGNOSIS — I4891 Unspecified atrial fibrillation: Secondary | ICD-10-CM

## 2013-11-29 MED ORDER — FUROSEMIDE 20 MG TABLET
20.0000 mg | ORAL_TABLET | Freq: Every day | ORAL | Status: DC
Start: 2013-11-29 — End: 2014-03-28

## 2013-11-29 NOTE — Patient Instructions (Signed)
Decrease Lasix to 20 mg one time a day for the next 1 week.  Daily weight.  If weight at end of week is greater than 2 pound increase then we will go back to 40mg  once a day.    Blood work today.

## 2013-12-02 ENCOUNTER — Other Ambulatory Visit (INDEPENDENT_AMBULATORY_CARE_PROVIDER_SITE_OTHER): Payer: Self-pay

## 2013-12-06 ENCOUNTER — Other Ambulatory Visit (INDEPENDENT_AMBULATORY_CARE_PROVIDER_SITE_OTHER): Payer: Self-pay | Admitting: Internal Medicine

## 2013-12-06 ENCOUNTER — Encounter (INDEPENDENT_AMBULATORY_CARE_PROVIDER_SITE_OTHER): Payer: Self-pay | Admitting: Internal Medicine

## 2013-12-06 MED ORDER — CARVEDILOL 3.125 MG TABLET
3.13 mg | ORAL_TABLET | Freq: Two times a day (BID) | ORAL | Status: DC
Start: 2013-12-06 — End: 2014-01-16

## 2013-12-08 DIAGNOSIS — I959 Hypotension, unspecified: Secondary | ICD-10-CM | POA: Insufficient documentation

## 2013-12-08 NOTE — Progress Notes (Addendum)
OV dictated    Rosalita Chessman L. Manson Passey, PA-C      I have seen and evaluated the patient jointly with Herbie Saxon PA-C and agree with her assessment, recommendations and plans.      Vincent Gros, MD 12/12/2013 22:32

## 2013-12-09 NOTE — Progress Notes (Addendum)
[facility]  [address] [place]  [place], [place] [id number]    PROGRESS NOTE    PATIENT NAME: Jim MainlandSNEDEGAR, Ankith N  CHART NUMBER: [id number]  DATE OF BIRTH:   DATE OF SERVICE: 11/29/2013    [facility]  [address]  [place], [place]  [phone]       SUBJECTIVE:  This is a 73 year old white male who presents to the office today for staple removal undergoing biventricular ICD implant and atrial fibrillation cardioversion on Nov 21, 2013, but is also here for sick call.  He does have a history of coronary artery disease, ischemic cardiomyopathy, EF of 15-20% and previous atrial fibrillation, diabetes mellitus, noninsulin dependent, hypertension.  Post-ICD implant he had pulmonary edema, had increased Lasix dosing and has continued this while at home.  His blood pressure today is 82/54 and dizziness.  He has had no actual loss of consciousness.  He appears dehydrated.  We will obtain a BMP today.  We will decrease his Coreg dosing and decrease his Lasix dosing for one week as well the use of compression stockings.  He has been without chest pain, shortness of breath or dyspnea upon exertion.  The ICD site is clear, without evidence of erythema, edema, exudate or fluctuance.  His staples are removed with wound well approximated without difficulty.    ALLERGIES:  Lipitor and pravastatin.    MEDICATIONS:  1.  Pacerone 400 mg daily.  2.  Eliquis 5 mg b.i.d.  3.  Vitamin C 500 mg daily.  4.  Coreg 3.125 mg p.o. b.i.d.  5.  Lanoxin 0.125 mcg p.o. daily.  6.  Folvite 1 mg daily.  7.  Lasix 20 mg daily.  8.  Glucotrol 5 mg daily.  9.  Cozaar 25 mg daily.  10.  Glucophage 500 mg b.i.d.  11.  Multivitamin 1 p.o. daily.  12.  Aldactone 25 mg daily.    REVIEW OF SYSTEMS:  No fever or chills.  No headache, syncope or near syncopal event, but he has had marked dizziness.  No chest pain, shortness of breath at rest or dyspnea upon exertion.  No orthopnea or PND.  No palpitations.  No cough or  hemoptysis.  No abdominal pain, nausea, vomiting, diarrhea, change in either bowel or bladder habits.  No melena or tarry stool.  No edema nor known weight change.    OBJECTIVE:  On physical exam, blood pressure 82/54, pulse is 76 and regular, respirations 18, even and nonlabored.  Height is 6 feet 0 inch, weight 180 pounds and SpO2 is 96% on room air.  On general exam, he is alert and oriented x3, a poor historian.  Normocephalic, atraumatic.  Sclerae is clear, nonicteric.  Neck is supple without adenopathy, JVD or bruit.  There is no thyromegaly.  Lungs are clear to auscultation with equal air entry bilaterally.  Heart is regular rate without audible murmur, gallop or rub.  Abdomen is soft, nontender, nondistended without hepatosplenomegaly or palpable mass.  There are positive bowel sounds throughout.  Extremities are without clubbing, cyanosis or edema as well as symmetrical pulses.    ASSESSMENT:  1.  Hypotension in the setting of dehydration.  2.  Ischemic cardiomyopathy with recent BIV ICD implant.  He has no signs or symptoms of infection today and his staples were removed intact.  3.  History of atrial fibrillation, recent cardioversion.  4.  Left bundle branch block.  5.  Diabetes mellitus, noninsulin dependent.  6.  Hypertension.    PLAN:  1.  We will decrease his Lasix to 20 mg daily for the future 1 week.  If his weight is greater than 180 he may then resume his Lasix.  If he has greater than 2-pound weight gain we will increase his Lasix to 40.  2.  Please keep regularly scheduled appointment with Dr. Rennie Plowman.      Gweneth Fritter, PA-C  Section of Cardiology  [facility] Department of Medicine    Linward Natal, MD  Assistant Professor, Section of Cardiology  [facility] Department of Medicine    HR/[id number]; D: 12/08/2013 13:34:39; T: 12/09/2013 07:41:06    cc: Nehemiah Settle MD      [address]       [place], [place] [address]    I have seen and evaluated the patient jointly with Herbie Saxon PA-C and agree  with her assessment, recommendations and plans. Ischemic CMP s/p PCI, recent BI v ICD, has symptomatic hypotension will cut back on meds and then re-titrate as tolerated.    Vincent Gros, MD 12/14/2013 11:37

## 2013-12-21 ENCOUNTER — Ambulatory Visit (INDEPENDENT_AMBULATORY_CARE_PROVIDER_SITE_OTHER): Payer: Medicare Other

## 2013-12-21 VITALS — BP 106/60 | HR 78 | Resp 16 | Ht 72.0 in | Wt 183.9 lb

## 2013-12-21 DIAGNOSIS — I255 Ischemic cardiomyopathy: Secondary | ICD-10-CM

## 2013-12-21 DIAGNOSIS — Z95810 Presence of automatic (implantable) cardiac defibrillator: Secondary | ICD-10-CM

## 2013-12-21 DIAGNOSIS — I2589 Other forms of chronic ischemic heart disease: Secondary | ICD-10-CM

## 2013-12-21 NOTE — Progress Notes (Signed)
See dictaiton

## 2013-12-21 NOTE — Procedures (Signed)
See dictaiton

## 2013-12-22 NOTE — Progress Notes (Signed)
[facility]  [address] [place], [place] [address]    PROGRESS NOTE    PATIENT NAME: [name], [name]  CHART NUMBER: [id number]  DATE OF BIRTH:   DATE OF SERVICE: 12/21/2013    [facility]  [address]  [place], [place]  [address]    DEVICE INTERROGATION    The patient recently received a St. Jude Medical 3365 biventricular defibrillator.  He reports marked symptom improvement.  The estimated longevity is 6.6 years.    A-waves are greater than 5 millivolts.  R-waves are 7.9 millivolts.  The atrial capture threshold is 0.87 volts at 0.5 milliseconds.  The lead impedance is 540 ohms.  The right ventricular threshold is 0.5 volts at 0.5 milliseconds.  The lead impedance is 440 ohms with a high voltage impedance of 46 ohms.  The left ventricular capture threshold is 1.25 volts at 0.5 milliseconds with a lead impedance of 1025 ohms.  There have been no tachyarrhythmias documented.  Today, auto-capture was activated in the right atrium.    ASSESSMENT:  Normal device function.    PLAN:  We will conduct a home check in 3 months with an office interrogation at 6 months.      Jackquline Bosch, MD  Associate Professor  [facility] Department of Medicine, Section of Cardiology    [id number]; D: 12/21/2013 13:55:35; T: 12/22/2013 07:42:15    cc: Nehemiah Settle MD      [address]       [place], [place] [address]

## 2014-01-09 ENCOUNTER — Ambulatory Visit (INDEPENDENT_AMBULATORY_CARE_PROVIDER_SITE_OTHER): Payer: Medicare Other | Admitting: Internal Medicine

## 2014-01-09 VITALS — BP 140/80 | HR 118 | Resp 18 | Ht 72.0 in | Wt 200.4 lb

## 2014-01-09 DIAGNOSIS — I4891 Unspecified atrial fibrillation: Secondary | ICD-10-CM

## 2014-01-09 NOTE — Progress Notes (Signed)
See dictated note.

## 2014-01-10 ENCOUNTER — Inpatient Hospital Stay
Admission: RE | Admit: 2014-01-10 | Discharge: 2014-01-10 | Disposition: A | Discharge status: Home / Self Care | Payer: Medicare Other | Source: Ambulatory Visit

## 2014-01-10 ENCOUNTER — Encounter (HOSPITAL_COMMUNITY): Payer: Medicare Other

## 2014-01-10 ENCOUNTER — Ambulatory Visit (HOSPITAL_COMMUNITY): Payer: Medicare Other

## 2014-01-10 ENCOUNTER — Encounter (HOSPITAL_COMMUNITY): Payer: Self-pay

## 2014-01-10 DIAGNOSIS — I509 Heart failure, unspecified: Secondary | ICD-10-CM | POA: Insufficient documentation

## 2014-01-10 DIAGNOSIS — I10 Essential (primary) hypertension: Secondary | ICD-10-CM | POA: Insufficient documentation

## 2014-01-10 DIAGNOSIS — I4891 Unspecified atrial fibrillation: Secondary | ICD-10-CM | POA: Insufficient documentation

## 2014-01-10 DIAGNOSIS — Z538 Procedure and treatment not carried out for other reasons: Secondary | ICD-10-CM | POA: Insufficient documentation

## 2014-01-10 DIAGNOSIS — I2510 Atherosclerotic heart disease of native coronary artery without angina pectoris: Secondary | ICD-10-CM | POA: Insufficient documentation

## 2014-01-10 DIAGNOSIS — E119 Type 2 diabetes mellitus without complications: Secondary | ICD-10-CM | POA: Insufficient documentation

## 2014-01-10 DIAGNOSIS — I447 Left bundle-branch block, unspecified: Secondary | ICD-10-CM

## 2014-01-10 LAB — CBC/DIFF
BASOPHILS: 1 %
BASOS ABS: 0.062 THOU/uL (ref 0.000–0.200)
EOS ABS: 0.238 THOU/uL (ref 0.000–0.500)
EOSINOPHIL: 4 %
HCT: 42.4 % (ref 36.7–47.0)
HGB: 14.1 g/dL (ref 12.5–16.3)
LYMPHOCYTES: 19 %
LYMPHS ABS: 1.235 THOU/uL (ref 1.000–4.800)
MCH: 30.1 pg (ref 27.4–33.0)
MCHC: 33.2 g/dL (ref 32.5–35.8)
MCV: 90.5 fL (ref 78–100)
MONOCYTES: 10 %
MONOS ABS: 0.684 THOU/uL (ref 0.300–1.000)
MPV: 8.5 fL (ref 7.5–11.5)
PLATELET COUNT: 213 THOU/uL (ref 140–450)
PMN ABS: 4.426 THOU/uL (ref 1.500–7.700)
PMN'S: 66 %
RBC: 4.68 MIL/uL (ref 4.06–5.63)
RDW: 15.8 % — ABNORMAL HIGH (ref 12.0–15.0)
WBC: 6.6 THOU/uL (ref 3.5–11.0)

## 2014-01-10 LAB — PT/INR
INR: 1.16 (ref 0.80–1.20)
INR: 1.16 (ref 0.80–1.20)
PROTHROMBIN TIME: 12.7 s (ref 8.7–13.2)

## 2014-01-10 LAB — ELECTROLYTES
ANION GAP: 9 mmol/L (ref 4–13)
CARBON DIOXIDE: 23 mmol/L (ref 22–32)
CHLORIDE: 108 mmol/L (ref 96–111)
POTASSIUM: 7.2 mmol/L (ref 3.5–5.1)
SODIUM: 140 mmol/L (ref 136–145)

## 2014-01-10 LAB — CREATININE WITH EGFR
CREATININE: 1.34 mg/dL — ABNORMAL HIGH (ref 0.62–1.27)
ESTIMATED GLOMERULAR FILTRATION RATE: 56 ml/min/1.73m2 — ABNORMAL LOW (ref >59)

## 2014-01-10 LAB — BUN
BUN/CREAT RATIO: 13 (ref 6–22)
BUN: 17 mg/dL (ref 8–25)

## 2014-01-10 LAB — PTT (PARTIAL THROMBOPLASTIN TIME): APTT: 27.4 s (ref 25.1–36.5)

## 2014-01-10 LAB — CALCIUM: CALCIUM: 9.5 mg/dL (ref 8.5–10.4)

## 2014-01-10 LAB — MAGNESIUM: MAGNESIUM: 4.9 mg/dL — ABNORMAL HIGH (ref 1.6–2.5)

## 2014-01-10 MED ORDER — SODIUM CHLORIDE 0.9 % INTRAVENOUS SOLUTION
INTRAVENOUS | Status: DC
Start: 2014-01-10 — End: 2014-01-10

## 2014-01-10 NOTE — Progress Notes (Signed)
ICD interrogation documents conversion to sinus rhythm.  Procedure, therefore, cancelled.    Will DC patient to home.  Continue meds including amiodarone at dose of 400 mg daily.  I will see in [place] office in 4 weeks or sooner as needed.

## 2014-01-10 NOTE — Progress Notes (Signed)
[facility]  [address] [place]  [place], [place] [id number]    PROGRESS NOTE    PATIENT NAME: Jim MainlandSNEDEGAR, Rossi N  CHART NUMBER: [id number]  DATE OF BIRTH:   DATE OF SERVICE: 01/09/2014    [facility]  [address]  [place], [place]  [id number]       PRIMARY CARE PHYSICIAN:  H. Hubert AzureAriel Valentine, MD.    SUBJECTIVE:  [name] was seen today as an urgent visit.  He is attending cardiac rehab; however, this morning when he woke up, after he had his breakfast, suddenly he did not feel right.  He felt just tired and not feeling well.  No particular complaints of chest pain or even palpitation.  No syncope or near syncope.  When he presented for cardiac rehabilitation, he was noted to have a rapid heart rate and irregular, and because he was not feeling well with a rapid irregular heartbeat, he was advised to come over to our office, and we saw him on an urgent basis basically with a complaint of not feeling well.  He has prior history of coronary artery disease status post PCI to LAD with a 3.0 x 18 mm Xience Xpedition stent, which was distally overlapped with a 2.75 x 9 mm Xpedition stent, PCI to RCA with a 2.5 x 15 mm Xpedition stent.  This was in February of 2013.  More recently he had a bout of atrial fibrillation.  He had an underlying ischemic cardiomyopathy and that put him in to severe decompensated heart failure, which after cardioversion and a biventricular device he was doing great in cardiac rehab tolerating large level of exercise, making good progress with cardiac rehabilitation until this morning.  He was on rhythm control with amiodarone, on Coreg at a reduced dose of 3.125 mg due to hypotension bout a month ago.  He was also on Aldactone 25.  His Cozaar was on hold and his furosemide the dose was supposed to be at 20 mg, however, his sister was only giving it to him.  He was weighing 200 pounds in our office today.  He has not been taking the furosemide for several  days.  He is not on a statin because he was intolerant to Lipitor or pravastatin, and he had some memory issues so we did not want to rechallenge him with further other statins.    REVIEW OF SYSTEMS:  CNS:  No headache.  No visual changes.  No weakness or numbness.  Ears/ Nose/Throat:  No sore throat.  No earache.  Respiratory:  No cough, fever or chills.  GI:  No nausea, vomiting, diarrhea.  No blood in the stools.  No blood in the urine.    PHYSICAL EXAMINATION:  He is awake and alert, in no acute distress, 6 foot tall, weighs 200 pounds, that is 20 pounds above his last weight of 180 pounds.  Pulse 129 beats per minute, irregularly irregular.  Blood pressure 140/80.  Pulse ox 98%.  Head:  Atraumatic.  Neck:  Supple, no jugular venous distention, no carotid bruit.  Chest:  Clear to auscultation.  Heart:  Normal S1, normal S2, no murmurs,  irregularly irregular.  Abdomen:  Soft.  Extremity:  Maybe trace edema.    ASSESSMENT AND PLAN:  1.  Atrial fibrillation with RVR.  He is on amiodarone.  I discussed the case over the phone with Dr. Rennie PlowmanHull.  We will give him another attempt of cardioversion.  He will present  himself tomorrow morning.  He will present himself to cardiac catheterization [facility] for an elective outpatient cardioversion through his ICD device.  I advised him to increase his Coreg from 3.125 to 6.25 mg twice a day.  After he gets his cardioversion, we will resume his Lasix at 40 mg as well as Cozaar at 25.  He will come to my office next Monday.  He will do a basic metabolic panel prior to seeing me and I will see him later, and if he is doing well, I will send him back to cardiac rehabilitation.  We will put his cardiac rehabilitation on hold for this week.  2.  Severe ischemic cardiomyopathy status post ICD device.  His functional status remarkably improved after the biventricular devise and cardiac rehabilitation.  He is on good medical therapy.  I will reassess him next week.      Linward Natal, MD  Assistant Professor, Section of Cardiology  [facility] Department of Medicine    [id number]; D: 01/09/2014 11:26:59; T: 01/10/2014 11:47:14    cc: Nehemiah Settle MD      [address]       [place], [place] [address]

## 2014-01-10 NOTE — Nurses Notes (Signed)
Dr Rennie Plowman at bedside. Patient in normal sinus rhythm. Procedure cancelled. PIV d/c'd. Patient left in stable condition.

## 2014-01-10 NOTE — Discharge Instructions (Signed)
Discharge Instructions after Your Cardioversion      A Cardioversion is a procedure done when you have an abnormal heart rhythm (beat).  Abnormal heartbeats are also called arrhythmias.  Your heartbeat is controlled by your heart's own electrical system.  Problems with your heart's electrical system may cause your heart to beat abnormally some, or all of the time.  Arrhythmias may cause you to feel weak, dizzy, and have trouble breathing.  You also may be at a greater risk for heart failure and stroke.      Activity  For the next 24 hours  . You may feel sleepy.  Do not drive or operate machinery or power tools.  (You must have someone take you home)  . Do not drink any alcoholic beverages  . Do not make any important decisions or sign any legal documents      Diet  . You may resume your normal diet.    Call your doctor if you have:  . Temperature greater than 101 degrees F.  . New onset of discomfort in the center of your chest that feels like squeezing, pressure, fullness, or pain that lasts for more than a few minutes or keeps returning.  . New onset of discomfort or pain in your back, neck, jaw, stomach or one or both of your arms.  . Nausea  . Difficulty breathing.

## 2014-01-10 NOTE — Nurses Notes (Signed)
Patient ambulated back to [facility]. Offered restroom. Changed into gown. Instructed on use of call bell and television remote. Vitals signs obtained. Attempting IV access and lab collection at this time. Awaiting healthcare provider to consent patient. Will prep accordingly.Will continue to monitor.

## 2014-01-16 ENCOUNTER — Ambulatory Visit (INDEPENDENT_AMBULATORY_CARE_PROVIDER_SITE_OTHER): Payer: Medicare Other | Admitting: Internal Medicine

## 2014-01-16 VITALS — BP 160/80 | HR 84 | Resp 16 | Ht 72.0 in | Wt 194.9 lb

## 2014-01-16 DIAGNOSIS — I4891 Unspecified atrial fibrillation: Secondary | ICD-10-CM

## 2014-01-16 DIAGNOSIS — I10 Essential (primary) hypertension: Secondary | ICD-10-CM

## 2014-01-16 DIAGNOSIS — I509 Heart failure, unspecified: Secondary | ICD-10-CM

## 2014-01-16 DIAGNOSIS — I447 Left bundle-branch block, unspecified: Secondary | ICD-10-CM

## 2014-01-16 DIAGNOSIS — I2589 Other forms of chronic ischemic heart disease: Secondary | ICD-10-CM

## 2014-01-16 DIAGNOSIS — I480 Paroxysmal atrial fibrillation: Secondary | ICD-10-CM

## 2014-01-16 DIAGNOSIS — I255 Ischemic cardiomyopathy: Secondary | ICD-10-CM

## 2014-01-16 DIAGNOSIS — I5022 Chronic systolic (congestive) heart failure: Secondary | ICD-10-CM

## 2014-01-16 DIAGNOSIS — I2510 Atherosclerotic heart disease of native coronary artery without angina pectoris: Principal | ICD-10-CM

## 2014-01-16 MED ORDER — CARVEDILOL 6.25 MG TABLET
6.2500 mg | ORAL_TABLET | Freq: Two times a day (BID) | ORAL | Status: DC
Start: 2014-01-16 — End: 2015-02-12

## 2014-01-16 MED ORDER — AMIODARONE 200 MG TABLET
200.0000 mg | ORAL_TABLET | Freq: Every day | ORAL | Status: DC
Start: 2014-01-16 — End: 2015-01-30

## 2014-01-16 NOTE — Addendum Note (Signed)
Addended by: Blanch Media ANN MARIE on: 01/16/2014 04:06 PM     Modules accepted: Orders, Medications

## 2014-01-16 NOTE — Progress Notes (Signed)
See dictated note.

## 2014-01-16 NOTE — Addendum Note (Signed)
Addended by: Blanch Media ANN MARIE on: 01/16/2014 04:01 PM     Modules accepted: Medications

## 2014-01-17 NOTE — Progress Notes (Signed)
[facility]  [address] [place], [place] [address]    PROGRESS NOTE    PATIENT NAME: [name], [name]  CHART NUMBER: [id number]  DATE OF BIRTH:   DATE OF SERVICE: 01/16/2014    [facility]  [address]  [place], [place]  [address]     PRIMARY CARE PHYSICIAN:  Nehemiah Settle, MD.    SUBJECTIVE:  Mr. Moya is in the office accompanied by his sister.  He has a history of coronary artery disease status post PCI to LAD with implantation of a 3.0 x 18-mm Xience Xpedition stent distally overlapped with a 2.75 x 9-mm Xience Xpedition stent as well as PCI to RCA with a 2.5 x 15-mm Xience Xpedition stent in February 2013.  He also has paroxysmal atrial fibrillation.  He has a left bundle branch block.  He had ischemic cardiomyopathy status post biventricular ICD with remarkable improvement in his symptoms.  Last time he was in the office, he was in atrial fibrillation with RVR.  He was transferred to [facility].  He was set for outpatient cardioversion at [facility]; however, his reverted back to sinus rhythm.  He is in my office to follow up.  His sister indicates that she will be traveling abroad and he will be with his other sister and he is doing remarkably better.  He is back to cardiac rehab today.  He needs some of the medications refilled; Coreg 6.5 mg, amiodarone, as well as the Eliquis.  He has no complaints of chest pain, palpitations, syncope or near syncope.      MEDICATIONS:  He indicates that he is compliant with his current medications, including:  1.  Eliquis 5 mg twice daily.  2.  Amiodarone at 400 mg daily.    3.  He is not on a statin because of his forgetfulness and concern about dementia.  4.  Carvedilol 6.5 mg twice daily.  5.  Cozaar 50 mg daily.  6.  Lasix 40 mg daily.  7.  Aldactone 25 mg daily.  8.  Digoxin 125 mcg daily.    In addition to his other medications including:  9.  Vitamin C.  10.  Vitamin D.  11.  Coenzyme Q10.    12.  Vitamin B12.  13.  Folic  acid.  14.  Glucotrol with iron.  15.  Metformin.  16.  Omega-3 fatty acids.  17.  Zantac.    PHYSICAL EXAMINATION:  He is awake and alert.  He is 6 feet tall, weighs 194 pounds, pulse 84 beats per minute, blood pressure 160/80, pulse ox 99%.  Head:  Atraumatic.  Neck:  Supple.  Chest:  Clear.  Heart:  Normal S1, normal S2, no murmurs.  Abdomen:  Soft, nontender.  Extremities:  Equal pulses.  No cyanosis or finger clubbing.  Nervous system:  He is oriented x3.  Moves all of his extremities.  He is forgetful.    ASSESSMENT AND PLAN:  1.  Paroxysmal atrial fibrillation, currently in sinus rhythm.  He is on Amiodarone 400 mg. I advised him to cut back to 200 mg a day.  He is also on Coreg and Eliquis.  He is advised to continue.  2.  Hypertension.  Blood pressure is a little bit on the higher side; however, his home readings looked good.  Continue current medications, may consider increasing the Coreg in the future if he continues to have hypertension.  3.  Coronary artery disease status  post PCI with drug-eluting stents, stable.  No symptoms to suggest angina.  At this point, he seems to be stable.  Cut back on his amiodarone.  I refilled his request as well as Coreg for him today along with amiodarone at 200 mg.  He will follow up in 3 months.      Linward Natalhalak Omer Kristyanna Barcelo, MD  Assistant Professor, Section of Cardiology  [facility] Department of Medicine    [id number]; D: 01/16/2014 16:05:49; T: 01/17/2014 10:12:11    cc: Nehemiah SettleH Ariel Valentine MD      [address]       [place], [place] [id number]

## 2014-01-18 ENCOUNTER — Other Ambulatory Visit (INDEPENDENT_AMBULATORY_CARE_PROVIDER_SITE_OTHER): Payer: Self-pay

## 2014-01-22 ENCOUNTER — Ambulatory Visit (INDEPENDENT_AMBULATORY_CARE_PROVIDER_SITE_OTHER): Payer: Medicare Other | Admitting: Emergency Medicine

## 2014-01-22 VITALS — BP 126/80 | HR 70 | Temp 97.7°F | Resp 17 | Ht 72.5 in | Wt 195.0 lb

## 2014-01-22 DIAGNOSIS — H1030 Unspecified acute conjunctivitis, unspecified eye: Secondary | ICD-10-CM

## 2014-01-22 DIAGNOSIS — H1130 Conjunctival hemorrhage, unspecified eye: Secondary | ICD-10-CM

## 2014-01-22 DIAGNOSIS — H1131 Conjunctival hemorrhage, right eye: Secondary | ICD-10-CM

## 2014-01-22 MED ORDER — TOBRAMYCIN 0.3 % OP SOLN: 2.0000 [drp] | OPHTHALMIC | Status: DC

## 2014-01-22 NOTE — Patient Instructions (Signed)

## 2014-01-22 NOTE — Progress Notes (Signed)
[facility] [address], [place] [address] [phone]  Date:  01/22/2014   Name:  Darius HeinzWilliam Vrooman   DOB:  1941/01/22   MRN:  [id number]  PCP:  PROVIDER NOT IN SYSTEM    Chief Complaint: Conjunctivitis   History of Present Illness:  Darius Saunders is a 73 y.o. very pleasant male patient who presents with the following:  Past two days has experienced redness and drainage in right eye.  No pain in eye.  Diminished vision. No history of injury.  No antecedent coryza or cough/.  No contact lens use.  No improvement with over the counter medications or other home remedies. Denies other complaint or health concern today.  Patient Active Problem List   Diagnosis Date Noted  . Acute systolic congestive heart failure, NYHA class 3 07/10/2013  . Ischemic cardiomyopathy 07/10/2013  . Acute respiratory failure with hypoxia 07/09/2013  . CAD (coronary artery disease) 07/09/2013  . CAP (community acquired pneumonia) 07/08/2013  . Atrial fibrillation with RVR 07/08/2013  . DM2 (diabetes mellitus, type 2) 07/08/2013    Past Medical History  Diagnosis Date  . MI (myocardial infarction)   . Diabetes mellitus without complication   . Hypertension   . Atrial fibrillation   . Diabetic neuropathy     Past Surgical History  Procedure Laterality Date  . Appendectomy    . Tonsillectomy      History  Substance Use Topics  . Smoking status: Never Smoker   . Smokeless tobacco: Never Used  . Alcohol Use: Yes     Comment: Socially    Family History  Problem Relation Age of Onset  . Hypertension Sister     Allergies  Allergen Reactions  . Statins Other (See Comments)    Rash, memory loss    Medication list has been reviewed and updated.  Current Outpatient Prescriptions on File Prior to Visit  Medication Sig Dispense Refill  . apixaban (ELIQUIS) 5 MG TABS tablet Take 1 tablet (5 mg total) by mouth 2 (two) times daily.  60 tablet  0  .  cholecalciferol (VITAMIN D) 1000 UNITS tablet Take 1,000 Units by mouth daily.      Marland Kitchen. co-enzyme Q-10 30 MG capsule Take 30 mg by mouth every evening.      . Cyanocobalamin (VITAMIN B-12 IJ) Inject 1,000 mg as directed every 30 (thirty) days.       . digoxin (LANOXIN) 0.125 MG tablet Take 1 tablet (0.125 mg total) by mouth daily.  30 tablet  0  . folic acid (FOLVITE) 1 MG tablet Take 1 mg by mouth daily.      Marland Kitchen. levofloxacin (LEVAQUIN) 750 MG tablet Take 1 tablet (750 mg total) by mouth daily.  2 tablet  0  . losartan (COZAAR) 50 MG tablet Take 50 mg by mouth 2 (two) times daily.      . metFORMIN (GLUCOPHAGE) 500 MG tablet Take 1,000 mg by mouth 2 (two) times daily with a meal.      . metoprolol (LOPRESSOR) 100 MG tablet Take 1 tablet (100 mg total) by mouth 2 (two) times daily.  60 tablet  0  . Multiple Vitamin (MULTIVITAMIN WITH MINERALS) TABS tablet Take 1 tablet by mouth 2 (two) times daily.       Marland Kitchen. omega-3 acid ethyl esters (LOVAZA) 1 G capsule Take 1 g by mouth daily.      . vitamin C (ASCORBIC ACID) 500 MG tablet Take 500 mg by mouth 2 (  two) times daily.       No current facility-administered medications on file prior to visit.    Review of Systems:  As per HPI, otherwise negative.    Physical Examination: Filed Vitals:   01/22/14 1021  BP: 126/80  Pulse: 70  Temp: 97.7 F (36.5 C)  Resp: 17   Filed Vitals:   01/22/14 1021  Height: 6' 0.5" (1.842 m)  Weight: 195 lb (88.451 kg)   Body mass index is 26.07 kg/(m^2). Ideal Body Weight: Weight in (lb) to have BMI = 25: 186.5   GEN: WDWN, NAD, Non-toxic, Alert & Oriented x 3 HEENT: Atraumatic, Normocephalic.  Nasal congestion Ears and Nose: No external deformity. EXTR: No clubbing/cyanosis/edema NEURO: Normal gait.  PSYCH: Normally interactive. Conversant. Not depressed or anxious appearing.  Calm demeanor.   RIGHT eye:  Diffuse conjunctival injection.  Subconjunctival hemorrhage inferior eye.  Assessment and  Plan: Conjunctivitis Subconjunctival hemorrhage. tobrex  Signed,  Phillips Odor, MD

## 2014-02-08 ENCOUNTER — Encounter (INDEPENDENT_AMBULATORY_CARE_PROVIDER_SITE_OTHER): Payer: Medicare Other

## 2014-02-13 ENCOUNTER — Encounter (INDEPENDENT_AMBULATORY_CARE_PROVIDER_SITE_OTHER): Payer: Medicare Other | Admitting: Internal Medicine

## 2014-02-22 ENCOUNTER — Ambulatory Visit (INDEPENDENT_AMBULATORY_CARE_PROVIDER_SITE_OTHER): Payer: Medicare Other

## 2014-02-22 VITALS — BP 118/76 | HR 69 | Resp 20 | Wt 195.3 lb

## 2014-02-22 DIAGNOSIS — I4891 Unspecified atrial fibrillation: Secondary | ICD-10-CM

## 2014-02-22 NOTE — Progress Notes (Signed)
See dictation

## 2014-02-23 NOTE — Progress Notes (Signed)
[facility]  [address] [place], [place] [address]    PROGRESS NOTE    PATIENT NAME: [name], [name]  CHART NUMBER: [id number]  DATE OF BIRTH:   DATE OF SERVICE: 02/22/2014    [facility]  [address]  [place], [place]  [address]     SUBJECTIVE:  The patient underwent defibrillator implantation in May.  Today's visit is for a new and separate problem of atrial fibrillation.  He returned in July with atrial fibrillation.  He did not require cardioversion but converted spontaneously.  He was placed on amiodarone.  He is now down to a dose of 200 mg daily.  He is tolerating this well and has had no recurrent palpitations or documented atrial fibrillation.    MEDICATIONS:  Amiodarone, Eliquis, vitamin C, Tessalon Perles, Coreg 6.25 mg b.i.d., vitamin D3, coenzyme Q10, vitamin B12, digoxin, folate, Lasix, Glucotrol, Cozaar 25 mg daily, metformin, omega 3 fatty acids, Zantac, Aldactone 25 mg daily and vitamin C.    ALLERGIES:  LIPITOR and PRAVASTATIN.    OBJECTIVE:  In the office today, the patient is a pleasant gentleman who is alert and oriented x4.  The BMI is 26.5.  The blood pressure is 118/76.  The pulse is 70 and regular.  The respiratory rate is 20 and unlabored with room air saturation of 99%.  Lung fields are clear throughout.  Carotid upstrokes are brisk without bruits.  The defibrillator site is well healed and nontender.  There is a regular rate and rhythm with no audible murmur or gallop.  There is no peripheral edema.    A 12-lead electrocardiogram shows an AV paced rhythm with a prominent R wave in V1 consistent with left ventricular capture.  There is a single PVC.    ASSESSMENT AND PLAN:  The patient has good symptom control on low-dose amiodarone.  We will continue our current strategy to include amiodarone and Eliquis.  We will interrogate the device in 3 months.      Jackquline Bosch, MD  Associate Professor  [facility] Department of Medicine, Section of  Cardiology    [id number]; D: 02/22/2014 15:17:24; T: 02/23/2014 00:46:58    cc: Nehemiah Settle MD      [address]       [place], [place] [address]

## 2014-02-24 ENCOUNTER — Other Ambulatory Visit (INDEPENDENT_AMBULATORY_CARE_PROVIDER_SITE_OTHER): Payer: Self-pay

## 2014-03-28 ENCOUNTER — Ambulatory Visit (INDEPENDENT_AMBULATORY_CARE_PROVIDER_SITE_OTHER): Payer: Medicare Other | Admitting: Internal Medicine

## 2014-03-28 ENCOUNTER — Encounter (INDEPENDENT_AMBULATORY_CARE_PROVIDER_SITE_OTHER): Payer: Self-pay | Admitting: Internal Medicine

## 2014-03-28 VITALS — BP 120/64 | HR 82 | Resp 16 | Ht 72.0 in | Wt 198.9 lb

## 2014-03-28 DIAGNOSIS — I4891 Unspecified atrial fibrillation: Secondary | ICD-10-CM

## 2014-03-28 DIAGNOSIS — I2510 Atherosclerotic heart disease of native coronary artery without angina pectoris: Secondary | ICD-10-CM

## 2014-03-28 DIAGNOSIS — I2589 Other forms of chronic ischemic heart disease: Secondary | ICD-10-CM

## 2014-03-28 DIAGNOSIS — I255 Ischemic cardiomyopathy: Secondary | ICD-10-CM

## 2014-03-28 NOTE — Progress Notes (Signed)
See dictated note.

## 2014-03-29 NOTE — Progress Notes (Signed)
[facility]  [address] [place], [place] [address]    PROGRESS NOTE    PATIENT NAME: [name], [name]  CHART NUMBER: [id number]  DATE OF BIRTH:   DATE OF SERVICE: 03/28/2014    [facility]  [address]  [place], [place]  [address]    PRIMARY CARE PHYSICIAN:  H. Hubert Azure, MD    SUBJECTIVE:  Mr. Norum is a 73 year old gentleman with history of PCI to LAD with implantation of a 3.0 x 18 mm Xience Xpedition stent overlapped with a 2.75 x 9 mm Xience Xpedition stent as well as PCI to the RCA with a 2.5 x 15 mm Xience Xpedition stent all done in February of 2013.  He also has a history of paroxysmal atrial fibrillation.  He has a left bundle branch block with ischemic cardiomyopathy status post Bi-V ICD implantation with remarkable improvement in his symptoms.  He was in my office today accompanied by his sister.  He indicates that he used to do not leave his house because could not walk the 20 steps that he has.  He said he can do several times a day now.  He walked yesterday about 8,000 steps.  He says activity does not limit him, and he is doing very good with no complaints of chest pain, palpitations, syncope or near syncope.      MEDICATIONS:  He indicates that he is compliant with his current medications, including:  1.  Eliquis 5 mg twice a day.  2.  Amiodarone 200 mg daily.    3.  He is not on a statin because of some forgetfulness when this medication was started and he was taken off.    4.  Carvedilol 6.25 mg twice daily.  5.  We took him off his Cozaar last time, and his blood pressure seems to be still doing well, and we restarted him back on 25 mg, so he takes now 25 mg a day.  6.  Aldactone 25 mg a day.    In addition to his other medications, including:  7.  Metformin.    8.  Coenzyme Q.    9.  Digoxin 125 mcg a day.  10.  Lasix 40 mg a day.    11.  Glipizide.    12.  Folic acid.    13.  Iron.    14.  Fish oil.    15.  Magnesium.    REVIEW OF  SYSTEMS:  Central nervous system:  No headache, no visual changes.  Ears, nose and throat:  No sore throat, no earache.  Respiratory:  No cough, fever or chills.  Gastrointestinal:  No nausea, vomiting, diarrhea.  Genitourinary:  He has a kidney stone.  He is due to get lithotripsy.    OBJECTIVE:  He is awake and alert, in no acute distress, 6 feet tall, weighs 198 pounds, pulse 82 beats per minute, blood pressure 120/64.  He weighs 90.2 kg. Head:  Atraumatic.  Neck:  Supple.  Chest:  Clear.  Heart:  Normal S1, normal S2, no murmurs.  Abdomen:  Soft, nontender.  Extremities:  Equal pulses.  No cyanosis or finger clubbing.  Nervous system:  He is oriented x3 with no gross motor deficit.    STUDIES:  His EKG shows a dual chamber paced rhythm at 70.    ASSESSMENT AND PLAN:  1.  Coronary artery disease status post multivessel PCI, stable, no symptoms to suggest angina.  He  is on beta-blockers.  He is advised to continue.  2.  Paroxysmal atrial fibrillation, currently maintaining sinus rhythm on amiodarone.  He has a dual chamber pacer ICD, no shocks related to the device.  3.  He has paroxysmal atrial fibrillation on Eliquis.  He is advised to continue.  4.  In reference to his lithotripsy, I recommend that he stop his Eliquis on Wednesday and Thursday to pursue his procedure on Friday.  We will fax a copy of this to Dr. Daisy Lazar office.  I will see him back in 6 months.      Linward Natal, MD  Assistant Professor, Section of Cardiology  [facility] Department of Medicine    [id number]; D: 03/28/2014 15:01:39; T: 03/29/2014 22:26:54    cc: Nehemiah Settle MD      [address]       [place], [place] [address]

## 2014-04-12 ENCOUNTER — Other Ambulatory Visit (INDEPENDENT_AMBULATORY_CARE_PROVIDER_SITE_OTHER): Payer: Self-pay

## 2014-05-19 LAB — ENTER/EDIT NEPHROLOGY EXTERNAL LABS
ALKALINE PHOSPHATASE: 78
ALT (SGPT): 27
AST (SGOT): 26
BUN/CREAT RATIO: 14.12
BUN: 24
CALCIUM: 9
CARBON DIOXIDE: 25
CHLORIDE: 104
CHOLESTEROL: 249
CREATININE: 1.7
ESTIMATED GLOMERULAR FILTRATION RATE: 40
GLUCOSE, FASTING: 138
HCT: 37
HDL-CHOLESTEROL: 39
HEMOGLOBIN A1C: 6.2
HGB: 12.5
IRON BINDING CAPACITY: 414
LDL CHOLESTEROL,DIRECT: 172
PHOSPHORUS: 4.1
PLATELET COUNT: 146
POTASSIUM: 4.3
SODIUM: 139
TOTAL PROTEIN: 5.9
TRIGLYCERIDES: 192
VITAMIN B12: 402
WBC: 5.9

## 2014-06-21 ENCOUNTER — Encounter (INDEPENDENT_AMBULATORY_CARE_PROVIDER_SITE_OTHER): Payer: Self-pay | Admitting: NEPHROLOGY

## 2014-06-21 ENCOUNTER — Ambulatory Visit (INDEPENDENT_AMBULATORY_CARE_PROVIDER_SITE_OTHER): Payer: Medicare Other

## 2014-06-21 VITALS — BP 142/74 | HR 70 | Resp 18 | Ht 72.0 in | Wt 205.7 lb

## 2014-06-21 DIAGNOSIS — E539 Vitamin B deficiency, unspecified: Secondary | ICD-10-CM | POA: Insufficient documentation

## 2014-06-21 DIAGNOSIS — E785 Hyperlipidemia, unspecified: Secondary | ICD-10-CM | POA: Insufficient documentation

## 2014-06-21 DIAGNOSIS — G5700 Lesion of sciatic nerve, unspecified lower limb: Secondary | ICD-10-CM | POA: Insufficient documentation

## 2014-06-21 DIAGNOSIS — N189 Chronic kidney disease, unspecified: Secondary | ICD-10-CM | POA: Insufficient documentation

## 2014-06-21 DIAGNOSIS — D649 Anemia, unspecified: Secondary | ICD-10-CM | POA: Insufficient documentation

## 2014-06-21 DIAGNOSIS — I255 Ischemic cardiomyopathy: Secondary | ICD-10-CM

## 2014-06-21 DIAGNOSIS — K635 Polyp of colon: Secondary | ICD-10-CM | POA: Insufficient documentation

## 2014-06-21 DIAGNOSIS — Z95810 Presence of automatic (implantable) cardiac defibrillator: Secondary | ICD-10-CM

## 2014-06-21 NOTE — Procedures (Signed)
See dictation

## 2014-06-22 ENCOUNTER — Ambulatory Visit (INDEPENDENT_AMBULATORY_CARE_PROVIDER_SITE_OTHER): Payer: Medicare Other | Admitting: NEPHROLOGY

## 2014-06-22 ENCOUNTER — Encounter (INDEPENDENT_AMBULATORY_CARE_PROVIDER_SITE_OTHER): Payer: Self-pay | Admitting: NEPHROLOGY

## 2014-06-22 VITALS — BP 148/88 | HR 76 | Resp 14 | Ht 72.0 in | Wt 199.0 lb

## 2014-06-22 DIAGNOSIS — I129 Hypertensive chronic kidney disease with stage 1 through stage 4 chronic kidney disease, or unspecified chronic kidney disease: Secondary | ICD-10-CM

## 2014-06-22 DIAGNOSIS — E539 Vitamin B deficiency, unspecified: Secondary | ICD-10-CM

## 2014-06-22 DIAGNOSIS — D649 Anemia, unspecified: Secondary | ICD-10-CM

## 2014-06-22 DIAGNOSIS — G5700 Lesion of sciatic nerve, unspecified lower limb: Secondary | ICD-10-CM

## 2014-06-22 DIAGNOSIS — E785 Hyperlipidemia, unspecified: Secondary | ICD-10-CM

## 2014-06-22 DIAGNOSIS — K635 Polyp of colon: Secondary | ICD-10-CM

## 2014-06-22 DIAGNOSIS — N189 Chronic kidney disease, unspecified: Secondary | ICD-10-CM

## 2014-06-22 DIAGNOSIS — E1122 Type 2 diabetes mellitus with diabetic chronic kidney disease: Secondary | ICD-10-CM

## 2014-06-22 DIAGNOSIS — N183 Chronic kidney disease, stage 3 (moderate): Secondary | ICD-10-CM

## 2014-06-22 NOTE — Progress Notes (Signed)
[facility]  [address] [place], [place] [address]    PROGRESS NOTE    PATIENT NAME: [name], [name]  CHART NUMBER: [id number]  DATE OF BIRTH:   DATE OF SERVICE: 06/21/2014    [facility]  [address]  [place], [place]  [address]     DEVICE INTERROGATION:  The patient has a [employer] 3365 biventricular defibrillator in place.  The charge time is 8.6 seconds and the estimated longevity is 6 years.    A waves are greater than 5 millivolts and R waves are 6.2 millivolts.  The atrial capture threshold is 0.87 volts at 0.5 milliseconds.  The lead impedance is 510 ohms.  The right ventricular capture threshold is 0.75 volts at 0.5 milliseconds.  The lead impedance is 360 ohms with a high voltage impedance of 46 ohms.  The left ventricular capture threshold is 1.5 volts at 0.5 milliseconds.  The lead impedance is 840 ohms.  There have been no tachyarrhythmias detected.    ASSESSMENT AND PLAN:  The device is functioning normally.  We will conduct a home check in 3 months with an office interrogation repeated in 6 months.      Jackquline Bosch, MD  Associate Professor  [facility] Department of Medicine, Section of Cardiology    [id number]; D: 06/21/2014 14:40:34; T: 06/22/2014 93:90:30    cc: Nehemiah Settle MD      [address]       [place], [place] [address]

## 2014-06-22 NOTE — Progress Notes (Signed)
Chief Complaint   Patient presents with    CKD Stage 3       S:    [name] is a 73 y.o. male referred by Maida Sale, MD for evaluation of elevated serum creatinine level.  Information obtained from patient and sister who presents with the patient and who corroborates the history presented.  Additional information obtained from review of outside records (office notes and lab tests) from the office of Maida Sale, MD.     Most recent serum creatinine was 1.7 in October 2015.  Previous creatinines  Blood sugars are fairly well controlled at target as per review of outside records and discussion with patient.  Blood pressures are fairly well controlled (754-492 systolic) at target as per review of outside records and discussion with patient.  Review of records indicates that identifiable risk factors for chronic kidney disease include diabetes, hypertension, obesity, race, atherosclerosis.  On ultrasound, patient's right kidney is noted to be 3 cm smaller than the left but both have no evidence of abnormality otherwise. The patient does not report prior history of kidney stones, autoimmune disorders, kidney trauma, obstruction, prior history of acute kidney injury, NSAIA intake, recent IV contrast exposure, recent antibiotic intake or recent use of herbal and nephrotoxic medications.     Past Medical History   Diagnosis Date    HTN (hypertension)     Diabetes mellitus     Wears glasses     Wears dentures     CAD (coronary artery disease) 09/25/2011    CHF (NYHA class II, ACC/AHA stage C) 10/24/2013    Renal stones     BPH (benign prostatic hypertrophy)     CVA (cerebrovascular accident)          Past Surgical History   Procedure Laterality Date    Hx lithotripsy      Hx appendectomy      Hx tonsillectomy      Hx adenoidectomy      Hx heart catheterization      Coronary artery angioplasty      Hx pacemaker defibrillator placement           Allergies   Allergen Reactions     Lipitor [Atorvastatin]      Concerned about cognitive function.    Pravastatin Rash     Current Outpatient Prescriptions   Medication Sig    Amiodarone (PACERONE) 200 mg Oral Tablet Take 1 Tab (200 mg total) by mouth Once a day    apixaban (ELIQUIS) 5 mg Oral Tablet Take 5 mg by mouth Twice daily    ascorbic acid (VITAMIN C) 500 mg Oral Tablet Take 500 mg by mouth Twice daily     carvedilol (COREG) 6.25 mg Oral Tablet Take 1 Tab (6.25 mg total) by mouth Twice daily with food    cholecalciferol, vitamin D3, 1,000 unit Oral Tablet Take 1,000 Units by mouth Twice daily    Coenzyme Q10 (CO Q-10) 100 mg Oral Capsule Take by mouth Twice daily    cyanocobalamin (VITAMIN B12) 1,000 mcg/mL Injection Solution 1,000 mcg by Subcutaneous route Every 30 days    digoxin (LANOXIN) 125 mcg Oral Tablet Take 0.125 mg by mouth Once a day    folic acid (FOLVITE) 1 mg Oral Tablet Take 1 mg by mouth Once a day    furosemide (LASIX) 40 mg Oral Tablet Take 40 mg by mouth Once a day    glipiZIDE (GLUCOTROL) 5 mg Oral Tablet Take 2.5 mg  by mouth Every morning before breakfast Take 30 minutes before meals    Iron, Carbonyl 45 mg Oral Tablet Take by mouth Once a day    losartan (COZAAR) 50 mg Oral Tablet Take 25 mg by mouth Once a day     magnesium citrate 100 mg Oral Tablet Take by mouth Every 48 hours    metFORMIN (GLUCOPHAGE) 500 mg Oral Tablet Take 500 mg by mouth Twice daily with food    multivitamin Oral Tablet Take 1 Tab by mouth Once a day    nitroglycerin (NITROSTAT) 0.4 mg Sublingual Tablet, Sublingual 1 Tab (0.4 mg total) by Sublingual route Every 5 minutes as needed for Chest pain. for 3 doses over 15 minutes    Omega-3 Fatty Acids-Vitamin E (FISH OIL) 1,000 mg Oral Capsule Take 1,000 mg by mouth Once a day    ranitidine (ZANTAC) 150 mg Oral Tablet Take 75 mg by mouth     spironolactone (ALDACTONE) 25 mg Oral Tablet Take 1 Tab (25 mg total) by mouth Once a day     History     Social History    Marital Status:  Single     Spouse Name: N/A     Number of Children: N/A    Years of Education: N/A     Occupational History    Not on file.     Social History Main Topics    Smoking status: Never Smoker     Smokeless tobacco: Never Used    Alcohol Use: 0.5 oz/week     1 Cans of beer per week    Drug Use: No    Sexual Activity: Not on file     Other Topics Concern    Right Hand Dominant Yes     Social History Narrative    No narrative on file     Family History   Problem Relation Age of Onset    Coronary Artery Disease Other      Grandmother    Diabetes Mother     Hypertension Father     High Cholesterol Father     Diabetes Sister     Cancer Sister            REVIEW OF SYSTEMS    CONSTITUTIONAL:  Denies weight loss or fevers or fatigue  SKIN: Denies rashes or lesions  EYES: Denies eye diseases, known diabetic retinopathy  EARS, NOSE, THROAT: Denies hearing difficulty, sinus or throat problems  CARDIOVASCULAR: Denies chest pain, palpitations, PND or lower extremity swelling  RESPIRATORY: Denies shortness of breath  GASTROINTESTINAL:  Denies abdominal pain, nausea, vomiting, diarrhea  GENITOURINARY: Denies hematuria, known protein in the urine, kidney stones, dysuria, flank pain  MUSCULOSKELETAL: Occasional joint pains  NEUROLOGIC: Denies arm and leg numbness  HEM/LYMPH: Denies enlargement of glands  ENDOCRINE: Admits to nocturia  ALLERGIC/IMMUNE: Denies hay fever or allergies to animals      PHYSICAL EXAM  Filed Vitals:    06/22/14 1111   BP: 148/88   Pulse: 76   Resp: 14   Height: 1.829 m (6')   Weight: 90.266 kg (199 lb)     The patient looks as stated age, is in no acute distress and does not appear chronically ill.  HEAD:  Normocephalic.    EYES:  Anicteric.    EARS, NOSE AND THROAT:  Membranes clear and moist, no fetor.  Neck:  Supple.    LUNGS:  Clear to auscultation without adventitious sounds.    CARDIOVASCULAR:  Regular rate, no  rub.  No lower extremity edema.    ABDOMEN:  Soft, nontender, bowel sounds x4.       MUSCULOSKELETAL:   No overt joint deformities.    LYMPHATIC/HEMATOLOGIC:  No overt lymphadenopathy.    NEUROLOGIC:  No asterixis or gross motor or sensory defects.    PSYCHIATRIC:  Alert and oriented x3 with appropriate affect.    SKIN:  Warm and dry.    .  Office Visit on 06/22/2014   Component Date Value Ref Range Status    WBC 05/19/2014 5.9   Final    HGB 05/19/2014 12.5   Final    HCT 05/19/2014 37.0   Final    PLATELET COUNT 05/19/2014 146   Final    SODIUM 05/19/2014 139   Final    POTASSIUM 05/19/2014 4.3   Final    CHLORIDE 05/19/2014 104   Final    CARBON DIOXIDE 05/19/2014 25   Final    BUN 05/19/2014 24   Final    CREATININE 05/19/2014 1.7   Final    BUN/CREAT RATIO 05/19/2014 14.12   Final    ESTIMATED GLOMERULAR FILTRATION RA* 05/19/2014 40   Final    GLUCOSE, FASTING 05/19/2014 138   Final    CALCIUM 05/19/2014 9.0   Final    TOTAL PROTEIN 05/19/2014 5.9   Final    ALBUMIN (SERUM) 05/19/2014 3.9   Final    PHOSPHORUS 05/19/2014 4.1   Final    HEMOGLOBIN A1C 05/19/2014 6.2   Final    25-HYDROXY VITAMIN D 05/19/2014 39   Final    FERRITIN 05/19/2014 55   Final    IRON 05/19/2014 109   Final    IRON BINDING CAPACITY 05/19/2014 414   Final    CHOLESTEROL 05/19/2014 249   Final    HDL-CHOLESTEROL 05/19/2014 39   Final    LDL CHOLESTEROL,DIRECT 05/19/2014 172   Final    TRIGLYCERIDES 05/19/2014 192   Final    MAGNESIUM 05/19/2014 1.9   Final    AST (SGOT) 05/19/2014 26   Final    ALT (SGPT) 05/19/2014 27   Final    ALKALINE PHOSPHATASE 05/19/2014 78   Final    VITAMIN B12 05/19/2014 402   Final     Results for [name], [name] (MRN [id number]) as of 06/22/2014 13:27   Ref. Range 08/28/2011 22:47 08/29/2011 01:42 08/30/2011 02:04 09/05/2011 01:49 09/06/2011 02:09 11/21/2013 09:30 01/10/2014 09:15 05/19/2014 00:00   CREATININE No range found 1.14 1.21 1.23 0.94 0.97 1.45 (H) 1.34 (H) 1.7       ASSESSMENT:  1.  Chronic Kidney Disease - eGFR estimated at Stage 3 with fluctuating  levels of renal function over past 3 years and recent increase in creatinine.   Risk factors include diabetes, hypertension and if proteinuria present, would consider diabetic nephropathy.  Asymmetric kidney size - significance not clear, but in face of easily controlled blood pressure have low index of suspicion for renal artery disease.  2.  Stable electrolytes, no uremic signs or symptoms and good volume status  3.  Hypertension - well controlled  4.  No evidence of CKD-related anemia or mineral metabolism abnormalities  5.  Diabetes - fairly well controlled    RECOMMENDATIONS:  1.  Check urine protein soon. Consider tweaking RAAS dose if proteinuria present.  2   Continue current medications.  Continue RAAS inhibition for antiproteinuric effect.  3.  Advise quarterly blood and urine testing (see below) for CKD complications that tend  to evolve during Stage 3 or by later CKD stages (mineral metabolism abnormalities and anemia related to CKD).  4.  Discussed the importance of meticulous blood sugar and blood pressure control for their key roles in protecting the kidney from further injury and loss of function.  5.  Understands the importance of avoiding NSAIAs.  6.  RTC 6 months.              Charm Rings, DO, FACP, FASN  Chief, Section of Nephrology  Professor of Medicine        _______________________________________________________________________    Quarterly Labs:    CBC  Magnesium  Calcium  Phosphourus  Albumin  Electrolytes, BUN, creatinine  Iron, Fe Sat, TIBC  IPTH  Vit. D 25 0H  Hgb A1C    Random urine protein  Random urine creatinine  Random urine albumin      Dx: Chronic Renal Failure 585.3  Dx: Diabetes 250.40        Please FAX results to:    Charm Rings, DO, [place], [facility]  Fax:  [phone] [facility])  Fax:  [phone] [facility])

## 2014-06-22 NOTE — Progress Notes (Signed)
No chief complaint on file.      S:    [name] is a 74 y.o. male referred by Maida Sale, MD for evaluation of elevated serum creatinine level.  Information obtained from patient and spouse who presents with the patient and who corroborates the history presented.  Additional information obtained from review of outside records (office notes and lab tests) from the office of Maida Sale, MD.     Most recent serum creatinine   Blood pressures are well controlled at target as per review of outside records and discussion with patient.  Blood sugars are well controlled at target as per review of outside records and discussion with patient.  Review of records indicates that identifiable risk factors for chronic kidney disease include diabetes, hypertension, obesity, race, atherosclerosis, heart failure.   The patient does not report prior history of kidney stones, autoimmune disorders, knowledge of having a solitary kidney, kidney trauma, obstruction, prior history of acute kidney injury, NSAIA intake, recent IV contrast exposure, recent antibiotic intake or recent use of herbal and nephrotoxic medications.     Past Medical History   Diagnosis Date    HTN (hypertension)     Diabetes mellitus     Wears glasses     Wears dentures     CAD (coronary artery disease) 09/25/2011    CHF (NYHA class II, ACC/AHA stage C) 10/24/2013    Renal stones     BPH (benign prostatic hypertrophy)     CVA (cerebrovascular accident)          Past Surgical History   Procedure Laterality Date    Hx lithotripsy      Hx appendectomy      Hx tonsillectomy      Hx adenoidectomy      Hx heart catheterization      Coronary artery angioplasty      Hx pacemaker defibrillator placement           Allergies   Allergen Reactions    Lipitor [Atorvastatin]      Concerned about cognitive function.    Pravastatin Rash     Current Outpatient Prescriptions   Medication Sig    Amiodarone (PACERONE) 200 mg Oral Tablet Take 1  Tab (200 mg total) by mouth Once a day    apixaban (ELIQUIS) 5 mg Oral Tablet Take 5 mg by mouth Twice daily    ascorbic acid (VITAMIN C) 500 mg Oral Tablet Take 500 mg by mouth Twice daily     carvedilol (COREG) 6.25 mg Oral Tablet Take 1 Tab (6.25 mg total) by mouth Twice daily with food    cholecalciferol, vitamin D3, 1,000 unit Oral Tablet Take 1,000 Units by mouth Twice daily    Coenzyme Q10 (CO Q-10) 100 mg Oral Capsule Take by mouth Twice daily    cyanocobalamin (VITAMIN B12) 1,000 mcg/mL Injection Solution 1,000 mcg by Subcutaneous route Every 30 days    digoxin (LANOXIN) 125 mcg Oral Tablet Take 0.125 mg by mouth Once a day    folic acid (FOLVITE) 1 mg Oral Tablet Take 1 mg by mouth Once a day    furosemide (LASIX) 40 mg Oral Tablet Take 40 mg by mouth Once a day    glipiZIDE (GLUCOTROL) 5 mg Oral Tablet Take 2.5 mg by mouth Every morning before breakfast Take 30 minutes before meals    Iron, Carbonyl 45 mg Oral Tablet Take by mouth Once a day    losartan (COZAAR) 50 mg Oral  Tablet Take 25 mg by mouth Once a day     magnesium citrate 100 mg Oral Tablet Take by mouth Every 48 hours    metFORMIN (GLUCOPHAGE) 500 mg Oral Tablet Take 500 mg by mouth Twice daily with food    multivitamin Oral Tablet Take 1 Tab by mouth Once a day    nitroglycerin (NITROSTAT) 0.4 mg Sublingual Tablet, Sublingual 1 Tab (0.4 mg total) by Sublingual route Every 5 minutes as needed for Chest pain. for 3 doses over 15 minutes    Omega-3 Fatty Acids-Vitamin E (FISH OIL) 1,000 mg Oral Capsule Take 1,000 mg by mouth Once a day    ranitidine (ZANTAC) 150 mg Oral Tablet Take 75 mg by mouth     spironolactone (ALDACTONE) 25 mg Oral Tablet Take 1 Tab (25 mg total) by mouth Once a day     History     Social History    Marital Status: Single     Spouse Name: N/A     Number of Children: N/A    Years of Education: N/A     Occupational History    Not on file.     Social History Main Topics    Smoking status: Never Smoker        Smokeless tobacco: Never Used    Alcohol Use: 0.5 oz/week     1 Cans of beer per week    Drug Use: No    Sexual Activity: Not on file     Other Topics Concern    Right Hand Dominant Yes     Social History Narrative    No narrative on file     Family History   Problem Relation Age of Onset    Coronary Artery Disease Other      Grandmother    Diabetes Mother     Hypertension Father     High Cholesterol Father     Diabetes Sister     Cancer Sister            REVIEW OF SYSTEMS    CONSTITUTIONAL:  Denies weight loss or fevers or fatigue  SKIN: Denies rashes or lesions  EYES: Denies eye diseases, known diabetic retinopathy  EARS, NOSE, THROAT: Denies hearing difficulty, sinus or throat problems  CARDIOVASCULAR: Denies chest pain, palpitations, PND or lower extremity swelling  RESPIRATORY: Denies shortness of breath  GASTROINTESTINAL:  Denies abdominal pain, nausea, vomiting, diarrhea  GENITOURINARY: Denies hematuria, known protein in the urine, kidney stones, dysuria, flank pain  MUSCULOSKELETAL: Denies joint pains  NEUROLOGIC: Denies arm and leg numbness  HEM/LYMPH: Denies enlargement of glands  ENDOCRINE: Denies nocturia  ALLERGIC/IMMUNE: Denies hay fever or allergies to animals      PHYSICAL EXAM  Filed Vitals:    06/22/14 1111   BP: 148/88   Pulse: 76   Resp: 14   Height: 1.829 m (6')   Weight: 90.266 kg (199 lb)     The patient looks as stated age, is in no acute distress and does not appear chronically ill.  HEAD:  Normocephalic.    EYES:  Anicteric.    EARS, NOSE AND THROAT:  Membranes clear and moist, no fetor.  Neck:  Supple.    LUNGS:  Clear to auscultation without adventitious sounds.    CARDIOVASCULAR:  Regular rate, no rub.  No lower extremity edema.    ABDOMEN:  Soft, nontender, bowel sounds x4.    MUSCULOSKELETAL:   No overt joint deformities.    LYMPHATIC/HEMATOLOGIC:  No overt  lymphadenopathy.    NEUROLOGIC:  No asterixis or gross motor or sensory defects.    PSYCHIATRIC:  Alert and  oriented x3 with appropriate affect.    SKIN:  Warm and dry.    .  Office Visit on 06/22/2014   Component Date Value Ref Range Status    WBC 05/19/2014 5.9   Final    HGB 05/19/2014 12.5   Final    HCT 05/19/2014 37.0   Final    PLATELET COUNT 05/19/2014 146   Final    SODIUM 05/19/2014 139   Final    POTASSIUM 05/19/2014 4.3   Final    CHLORIDE 05/19/2014 104   Final    CARBON DIOXIDE 05/19/2014 25   Final    BUN 05/19/2014 24   Final    CREATININE 05/19/2014 1.7   Final    BUN/CREAT RATIO 05/19/2014 14.12   Final    ESTIMATED GLOMERULAR FILTRATION RA* 05/19/2014 40   Final    GLUCOSE, FASTING 05/19/2014 138   Final    CALCIUM 05/19/2014 9.0   Final    TOTAL PROTEIN 05/19/2014 5.9   Final    ALBUMIN (SERUM) 05/19/2014 3.9   Final    PHOSPHORUS 05/19/2014 4.1   Final    HEMOGLOBIN A1C 05/19/2014 6.2   Final    25-HYDROXY VITAMIN D 05/19/2014 39   Final    FERRITIN 05/19/2014 55   Final    IRON 05/19/2014 109   Final    IRON BINDING CAPACITY 05/19/2014 414   Final    CHOLESTEROL 05/19/2014 249   Final    HDL-CHOLESTEROL 05/19/2014 39   Final    LDL CHOLESTEROL,DIRECT 05/19/2014 172   Final    TRIGLYCERIDES 05/19/2014 192   Final    MAGNESIUM 05/19/2014 1.9   Final    AST (SGOT) 05/19/2014 26   Final    ALT (SGPT) 05/19/2014 27   Final    ALKALINE PHOSPHATASE 05/19/2014 78   Final    VITAMIN B12 05/19/2014 402   Final         Outside tests in addition to those noted above:     ASSESSMENT:  1.  Chronic Kidney Disease - eGFR estimated at Stage   and stable, but of unclear etiology.  Possibilities include hypertension, ageing and if proteinuria present, would consider diabetic nephropathy.  2.  Stable electrolytes, no uremic signs or symptoms and good volume status  3.  Hypertension - well controlled  4.  No evidence of CKD-related anemia or mineral metabolism abnormalities  5.  Diabetes - well controlled    RECOMMENDATIONS:  1.  Renal ultrasound to assure the presence of two kidneys and  for evaluation of parenchymal character and symmetry.  2   Continue current medications.  Continue RAAS inhibition for antiproteinuric effect.  3.  Advise quarterly blood and urine testing (see below) for CKD complications that tend to evolve during Stage 3 or by later CKD stages (mineral metabolism abnormalities and anemia related to CKD).  4.  Discussed the importance of meticulous blood sugar and blood pressure control for their key roles in protecting the kidney from further injury and loss of function.  5.  Understands the importance of avoiding NSAIAs.  6.  Old records requested from referring physician.  7.  RTC               Charm Rings, DO, FACP, FASN  Chief, Section of Nephrology  Professor of Medicine  _______________________________________________________________________    Quarterly Labs:    CBC  Magnesium  Calcium  Phosphourus  Albumin  Electrolytes, BUN, creatinine  Iron, Fe Sat, TIBC  IPTH  Vit. D 25 0H  Hgb A1C    Random urine protein  Random urine creatinine  Random urine albumin      Dx: Chronic Renal Failure 585.3  Dx: Diabetes 250.40        Please FAX results to:    Charm Rings, DO, [place], [facility]  Fax:  [phone] [facility])  Fax:  [phone] [facility])

## 2014-06-28 ENCOUNTER — Encounter (INDEPENDENT_AMBULATORY_CARE_PROVIDER_SITE_OTHER): Payer: Self-pay | Admitting: NEPHROLOGY

## 2014-07-04 ENCOUNTER — Encounter (FREE_STANDING_LABORATORY_FACILITY)
Admit: 2014-07-04 | Discharge: 2014-07-04 | Disposition: A | Discharge status: Home / Self Care | Payer: Medicare Other | Attending: NEPHROLOGY | Admitting: NEPHROLOGY

## 2014-07-04 DIAGNOSIS — D649 Anemia, unspecified: Secondary | ICD-10-CM | POA: Insufficient documentation

## 2014-07-04 DIAGNOSIS — E539 Vitamin B deficiency, unspecified: Secondary | ICD-10-CM | POA: Insufficient documentation

## 2014-07-04 DIAGNOSIS — E785 Hyperlipidemia, unspecified: Secondary | ICD-10-CM | POA: Insufficient documentation

## 2014-07-04 DIAGNOSIS — G5700 Lesion of sciatic nerve, unspecified lower limb: Secondary | ICD-10-CM | POA: Insufficient documentation

## 2014-07-04 DIAGNOSIS — N189 Chronic kidney disease, unspecified: Secondary | ICD-10-CM | POA: Insufficient documentation

## 2014-07-04 DIAGNOSIS — K635 Polyp of colon: Secondary | ICD-10-CM | POA: Insufficient documentation

## 2014-07-04 LAB — CBC
HCT: 40 % (ref 36.7–47.0)
HGB: 13.5 g/dL (ref 12.5–16.3)
HGB: 13.5 g/dL (ref 12.5–16.3)
MCH: 31 pg (ref 27.4–33.0)
MCHC: 33.9 g/dL (ref 32.5–35.8)
MCV: 91.5 fL (ref 78–100)
MPV: 8.8 fL (ref 7.5–11.5)
PLATELET COUNT: 158 THOU/uL (ref 140–450)
RBC: 4.37 MIL/uL (ref 4.06–5.63)
RDW: 13.3 % (ref 12.0–15.0)
WBC: 7 THOU/uL (ref 3.5–11.0)

## 2014-07-04 LAB — COMPREHENSIVE METABOLIC PANEL, NON-FASTING
ALBUMIN: 3.9 g/dL (ref 3.4–4.8)
ALKALINE PHOSPHATASE: 87 U/L (ref <150)
ALT (SGPT): 24 U/L (ref <55)
ANION GAP: 10 mmol/L (ref 4–13)
AST (SGOT): 23 U/L (ref 8–48)
BILIRUBIN, TOTAL: 0.6 mg/dL (ref 0.3–1.3)
BUN/CREAT RATIO: 12 (ref 6–22)
BUN/CREAT RATIO: 12 (ref 6–22)
BUN: 19 mg/dL (ref 8–25)
CALCIUM: 9.6 mg/dL (ref 8.5–10.4)
CARBON DIOXIDE: 26 mmol/L (ref 22–32)
CHLORIDE: 104 mmol/L (ref 96–111)
CREATININE: 1.62 mg/dL — ABNORMAL HIGH (ref 0.62–1.27)
ESTIMATED GLOMERULAR FILTRATION RATE: 45 ml/min/1.73m2 — ABNORMAL LOW (ref >59)
GLUCOSE,NONFAST: 129 mg/dL (ref 65–139)
POTASSIUM: 4.1 mmol/L (ref 3.5–5.1)
POTASSIUM: 4.1 mmol/L (ref 3.5–5.1)
SODIUM: 140 mmol/L (ref 136–145)
TOTAL PROTEIN: 6.5 g/dL (ref 6.0–8.0)

## 2014-07-04 LAB — PROTEIN, TOTAL URINE, RANDOM: PROTEIN, URINE, RANDOM: <5 mg/dL (ref <30)

## 2014-07-04 LAB — MICROALB/CREAT RATIO
CREATININE, UR RAND: 59 mg/dL — AB
MICROALBUMIN RANDOM URINE: <0.5 mg/dL (ref <2.0)

## 2014-07-04 LAB — PARATHYROID HORMONE (PTH): INTACT PTH: 120.8 pg/mL — ABNORMAL HIGH (ref 8.5–75.0)

## 2014-07-06 LAB — VITAMIN D, SERUM (25 HYDROXYVITAMIN D2 AND D3 BY MS)
25 HYDROXYVITAMIN D2/D3,total: 49 ng/mL (ref <100)
25 HYDROXYVITAMIN D2: <4 ng/mL
25 HYDROXYVITAMIN D3: 49 ng/mL

## 2014-08-21 LAB — ENTER/EDIT NEPHROLOGY EXTERNAL LABS
25-HYDROXY VITAMIN D: 37
ALBUMIN (SERUM): 3.9
ALKALINE PHOSPHATASE: 72
ALT (SGPT): 24
AST (SGOT): 25
BUN/CREAT RATIO: 17.65
BUN: 30
CALCIUM: 9
CARBON DIOXIDE: 26
CHLORIDE: 104
CHOLESTEROL: 221
CREATININE: 1.7
ESTIMATED GLOMERULAR FILTRATION RATE: 40
FERRITIN: 62
GLUCOSE, FASTING: 171
HCT: 38.9
HDL-CHOLESTEROL: 35
HEMOGLOBIN A1C: 8.9
HGB: 13
IRON BINDING CAPACITY: 406
IRON: 105
LDL CHOLESTEROL,DIRECT: 124
MAGNESIUM: 2
PHOSPHORUS: 2.6
PLATELET COUNT: 155
POTASSIUM: 4.6
SODIUM: 138
TOTAL PROTEIN: 5.8
TRIGLYCERIDES: 307
VITAMIN B12: 418
WBC: 6.4

## 2014-08-24 ENCOUNTER — Encounter (INDEPENDENT_AMBULATORY_CARE_PROVIDER_SITE_OTHER): Payer: Self-pay

## 2014-09-05 ENCOUNTER — Encounter (FREE_STANDING_LABORATORY_FACILITY)
Admit: 2014-09-05 | Discharge: 2014-09-05 | Disposition: A | Discharge status: Home / Self Care | Payer: Medicare Other | Attending: NEPHROLOGY | Admitting: NEPHROLOGY

## 2014-09-05 DIAGNOSIS — E539 Vitamin B deficiency, unspecified: Secondary | ICD-10-CM | POA: Insufficient documentation

## 2014-09-05 DIAGNOSIS — N189 Chronic kidney disease, unspecified: Secondary | ICD-10-CM | POA: Insufficient documentation

## 2014-09-05 DIAGNOSIS — K635 Polyp of colon: Secondary | ICD-10-CM | POA: Insufficient documentation

## 2014-09-05 DIAGNOSIS — E785 Hyperlipidemia, unspecified: Secondary | ICD-10-CM | POA: Insufficient documentation

## 2014-09-05 DIAGNOSIS — D649 Anemia, unspecified: Secondary | ICD-10-CM | POA: Insufficient documentation

## 2014-09-05 DIAGNOSIS — G5700 Lesion of sciatic nerve, unspecified lower limb: Secondary | ICD-10-CM | POA: Insufficient documentation

## 2014-09-05 LAB — PROTEIN, TOTAL URINE, RANDOM: PROTEIN, URINE, RANDOM: 21 mg/dL (ref <30)

## 2014-09-05 LAB — MICROALB/CREAT RATIO
CREATININE, UR RAND: 224 mg/dL — AB
MICROALBUMIN RANDOM URINE: 1.5 mg/dL (ref <2.0)
MICROALBUMIN/CREAT RATIO: 7 mg/g (ref <30)

## 2014-09-05 LAB — PARATHYROID HORMONE (PTH): INTACT PTH: 132.4 pg/mL — ABNORMAL HIGH (ref 8.5–75.0)

## 2014-09-12 ENCOUNTER — Encounter (INDEPENDENT_AMBULATORY_CARE_PROVIDER_SITE_OTHER): Payer: Medicare Other | Admitting: Family

## 2014-09-25 ENCOUNTER — Encounter (FREE_STANDING_LABORATORY_FACILITY)
Admit: 2014-09-25 | Discharge: 2014-09-25 | Disposition: A | Discharge status: Home / Self Care | Payer: Self-pay | Attending: Internal Medicine | Admitting: Internal Medicine

## 2014-09-25 ENCOUNTER — Other Ambulatory Visit (FREE_STANDING_LABORATORY_FACILITY): Payer: Self-pay | Admitting: Internal Medicine

## 2014-09-25 DIAGNOSIS — K621 Rectal polyp: Secondary | ICD-10-CM

## 2014-09-26 ENCOUNTER — Encounter (INDEPENDENT_AMBULATORY_CARE_PROVIDER_SITE_OTHER): Payer: Medicare Other | Admitting: Internal Medicine

## 2014-09-26 ENCOUNTER — Encounter (FREE_STANDING_LABORATORY_FACILITY)
Admit: 2014-09-26 | Discharge: 2014-09-26 | Disposition: A | Discharge status: Home / Self Care | Payer: Self-pay | Attending: Internal Medicine | Admitting: Internal Medicine

## 2014-09-26 DIAGNOSIS — K317 Polyp of stomach and duodenum: Secondary | ICD-10-CM

## 2014-09-26 LAB — HISTORICAL SURGICAL PATHOLOGY SPECIMEN

## 2014-09-27 ENCOUNTER — Other Ambulatory Visit (INDEPENDENT_AMBULATORY_CARE_PROVIDER_SITE_OTHER): Payer: Self-pay | Admitting: FAMILY PRACTICE

## 2014-09-29 LAB — HISTORICAL SURGICAL PATHOLOGY SPECIMEN

## 2014-10-02 ENCOUNTER — Ambulatory Visit (INDEPENDENT_AMBULATORY_CARE_PROVIDER_SITE_OTHER): Payer: Medicare Other | Admitting: Internal Medicine

## 2014-10-02 ENCOUNTER — Encounter (INDEPENDENT_AMBULATORY_CARE_PROVIDER_SITE_OTHER): Payer: Self-pay | Admitting: Internal Medicine

## 2014-10-02 VITALS — BP 118/72 | HR 78 | Resp 18 | Ht 72.0 in | Wt 201.5 lb

## 2014-10-02 DIAGNOSIS — I5020 Unspecified systolic (congestive) heart failure: Secondary | ICD-10-CM | POA: Insufficient documentation

## 2014-10-02 DIAGNOSIS — I255 Ischemic cardiomyopathy: Secondary | ICD-10-CM

## 2014-10-02 DIAGNOSIS — I2510 Atherosclerotic heart disease of native coronary artery without angina pectoris: Secondary | ICD-10-CM

## 2014-10-02 DIAGNOSIS — I4891 Unspecified atrial fibrillation: Secondary | ICD-10-CM

## 2014-10-02 DIAGNOSIS — I5022 Chronic systolic (congestive) heart failure: Secondary | ICD-10-CM

## 2014-10-02 NOTE — Progress Notes (Signed)
[facility]  [address] [place], [place] [address]    PROGRESS NOTE    PATIENT NAME: [name], [name]  CHART NUMBER: [id number]  DATE OF BIRTH:   DATE OF SERVICE: 10/02/2014    [facility]  [address]  [place], [place]  [address]    PRIMARY CARE PHYSICIAN:  H. Hubert Azure, MD    Dear Kathlen Mody:    SUBJECTIVE:  Mr. Kuka was in my office today for a followup visit.  He was accompanied by his sister.  As you may remember, he is a 74 year old gentleman with a history of coronary artery disease status post PCI to LAD with implantation of a 3.0 x 18 mm Xience Xpedition stent overlapped with a 2.75 x 9 mm Xience Xpedition stent as well as PCI to the RCA with a 2.5 x 15 mm Xience Xpedition stent, all done in February 2013.  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  He is in my office to follow up.  His ICD device is followed by Dr. Rennie Plowman.  During today's office visit, he feels great.  He is telling me that he is physically very active.  He does daily exercise.  He is functional class I, no limitations.  No symptoms of chest pain, palpitation, syncope or near syncope, no dizziness, lightheadedness.      MEDICATIONS:  He indicates that he is compliant with his current medications, including:  1.  Eliquis 5 mg twice daily.  2.  Amiodarone 200 mg once daily.  3.  Carvedilol 6.25 mg twice daily.  4.  Cozaar 25 mg once daily.  5.  Lasix 40 mg once daily.  6.  Aldactone 25 mg once daily.  7.  Digoxin 125 mcg daily.  In addition to his other medications, including:    8.  Vitamin C.    9.  Vitamin D.    10.  Coenzyme Q.  11.  Vitamin B12.    12.  Folic acid.  13.  Iron.  14.  Metformin.  15.  Multivitamin.  16.  Omega 3 fatty acids.  17.  Zantac.  18.  Nitroglycerin on as needed basis.    ALLERGIES:  He has trouble tolerating STATINS.    REVIEW OF SYSTEMS:  Central nervous  system:  No headache, no visual changes, a little bit of difficulty with his vision and he is due to see an eye doctor.  Ears, nose and throat:  No sore throat, no earache.  Respiratory:  No cough, fever or chills.  Gastrointestinal:  No nausea, vomiting, diarrhea.  No blood in the stools, no blood in the urine.    OBJECTIVE:  He is awake and alert, in no acute distress.  He is 6 feet tall, weighs 201 pounds, that is 91.4 kilograms, stable, pulse 78 beats per minute, blood pressure 118/72, pulse ox 100%.  Head:  Atraumatic.  Neck:  Supple, no jugular venous distention, no carotid bruit, no cervical lymph node enlargement.  Chest:  Clear to auscultation.  Heart:  Normal S1, normal S2, no murmurs.  Abdomen:  Soft, nontender.  Extremities:  No edema.      DIAGNOSTIC STUDIES:  His EKG today shows a dual chamber sequential biventricular paced rhythm.    ASSESSMENT AND PLAN:  Mr. Jakob is a 74 year old with:    1.  Coronary artery disease status post PCI to LAD  and RCA, stable, no symptoms to suggest angina.  He is on good medical therapy including the carvedilol and Cozaar.  2.  Severe ischemic cardiomyopathy.  He is on good medical treatment with Coreg, Cozaar and Aldactone, guideline directed treatment, and he is tolerating them very well.  He also has a biventricular ICD.  His functional class progressed from class III-IV now to class I.  He is very happy with that as well as his sister, and they are amazed by the progress that he made since he got his biventricular device.  3.  He has other comorbidities including being on amiodarone as well as diabetic.  I suggested for him to get his regular eye checks, which has been already scheduled, as well as to do a lung function test, TSH, chest x-ray through Dr. Dallas Schimke office.  They voiced understanding for that.  I will see him back in 6 months, sooner if he has any problems.      Linward Natal, MD  Assistant Professor, Section of Cardiology  [facility] Department  of Medicine    [id number]; D: 10/02/2014 10:46:42; T: 10/02/2014 22:31:03    cc: Nehemiah Settle MD      [address]       [place], [place] [address]

## 2014-10-02 NOTE — Progress Notes (Signed)
See dictated note.

## 2014-10-08 ENCOUNTER — Ambulatory Visit: Payer: Medicare Other

## 2014-10-08 DIAGNOSIS — I255 Ischemic cardiomyopathy: Secondary | ICD-10-CM | POA: Insufficient documentation

## 2014-10-08 DIAGNOSIS — Z95810 Presence of automatic (implantable) cardiac defibrillator: Principal | ICD-10-CM | POA: Insufficient documentation

## 2014-10-08 DIAGNOSIS — I509 Heart failure, unspecified: Secondary | ICD-10-CM

## 2014-10-08 DIAGNOSIS — I447 Left bundle-branch block, unspecified: Secondary | ICD-10-CM

## 2014-10-10 ENCOUNTER — Encounter (INDEPENDENT_AMBULATORY_CARE_PROVIDER_SITE_OTHER): Payer: Self-pay | Admitting: Internal Medicine

## 2014-10-10 NOTE — Progress Notes (Signed)
Called and spoke with patient's sister [name] to give instructions for patient to be brought to clinic for ekg's next week on Monday and Friday. Patient will be starting treatment for h.pylori and the pharmacist at [facility] called and discussed the possibility of the prolongation of the qt interval with Dr. Dellia Beckwith and he would like the patient to have ekg's on 10/16/14 and 10/20/14. [name] able to repeat the dates back to me and states she will ensure he is brought to the clinic for the ekg's. Encouraged her to call the clinic if any other needs arise.

## 2014-10-16 ENCOUNTER — Ambulatory Visit (INDEPENDENT_AMBULATORY_CARE_PROVIDER_SITE_OTHER): Payer: Medicare Other | Admitting: Internal Medicine

## 2014-10-16 DIAGNOSIS — I4891 Unspecified atrial fibrillation: Secondary | ICD-10-CM

## 2014-10-16 NOTE — H&P (Signed)
Here for EKG only

## 2014-10-19 NOTE — Procedures (Addendum)
Patient's ICD was checked by remote transmission.    Patient's ICD was evaluated, typically including programmed   parameters, lead(s), battery, capture, sensing function(s), presence or   absence of therapies, and underlying heart rhythm. Often but not always   this includes sensor rate response, lower and upper heart rates, AV   intervals, pacing voltage and pulse duration, sensing value, and   diagnostics.  Please see programmer printout for details (scanned document).    SJM #[id number]  CRT-D.  Mode=DDD@70  bpm.  Battery status etr=5.5 years.  A: 440 ohms, P=>5 mV, threshold=0.875V@0 .69ms  [output=auto ].  RV: 340 ohms, R=>12 mV, threshold=nt  [output=2.5V and 0.46ms ].  LV: 750 ohms, threshold=nt  [output=2.5V and 0.40ms ].  Ap=37% ,Vp=>99% .  VF detections=200 bpm. No recent vt/vf episodes.  No new alerts.  Will recheck again in 6 months from home by remote transmission.  Darlyn Chamber, RN 10/08/2014    PACEMAKER OR DEFIBRILLATOR EVALUATION (INTERROGATION EVALUATION OR PROGRAMMING EVALUATION), OR ANALYSIS OF REMOTE MONITORING, AS APPLICABLE    Order:    See patient's medical record.    Procedure Description:    Standard programming evaluation (or interrogation evaluation) using device programmer, or standard analysis of remote monitoring, as appropriate.    Data recording:    See printouts, where available, for details.    Physician Analysis and Reporting:    Unremarkable device analysis. No major abnormalities, except as otherwise noted, if applicable.      Plan:    Plan routine follow-up, as appropriate, or unless otherwise indicated.  Please see notes and/or scanned printouts (where available) for details.      Chanetta Marshall, MD 10/19/2014, 15:39

## 2014-10-20 ENCOUNTER — Other Ambulatory Visit (INDEPENDENT_AMBULATORY_CARE_PROVIDER_SITE_OTHER): Payer: Self-pay | Admitting: Internal Medicine

## 2014-10-20 DIAGNOSIS — I4891 Unspecified atrial fibrillation: Secondary | ICD-10-CM

## 2014-11-07 ENCOUNTER — Ambulatory Visit (INDEPENDENT_AMBULATORY_CARE_PROVIDER_SITE_OTHER): Payer: Medicare Other | Admitting: Family

## 2014-11-07 VITALS — BP 112/68 | HR 64 | Resp 16 | Ht 72.0 in | Wt 202.0 lb

## 2014-11-07 DIAGNOSIS — E1165 Type 2 diabetes mellitus with hyperglycemia: Secondary | ICD-10-CM

## 2014-11-07 DIAGNOSIS — I129 Hypertensive chronic kidney disease with stage 1 through stage 4 chronic kidney disease, or unspecified chronic kidney disease: Secondary | ICD-10-CM

## 2014-11-07 DIAGNOSIS — E119 Type 2 diabetes mellitus without complications: Secondary | ICD-10-CM

## 2014-11-07 DIAGNOSIS — N183 Chronic kidney disease, stage 3 unspecified (CMS HCC): Secondary | ICD-10-CM | POA: Insufficient documentation

## 2014-11-07 DIAGNOSIS — E1122 Type 2 diabetes mellitus with diabetic chronic kidney disease: Secondary | ICD-10-CM

## 2014-11-07 DIAGNOSIS — I10 Essential (primary) hypertension: Secondary | ICD-10-CM

## 2014-11-07 NOTE — Progress Notes (Addendum)
 [facility] Department of Medicine  [address]  [place], [place] [address]      PROGRESS NOTE    PATIENT NAME: [name], [name]  CHART NUMBER: [id number]  DATE OF BIRTH:   DATE OF SERVICE: 11/07/2014    IDENTIFICATION:  This is a 74 year old Caucasian male who is being followed in the [facility] for what appears to be chronic kidney disease stage III with baseline creatinines ranging 1.4-1.7 for the past year, most likely multifactorial in etiology with uncontrolled diabetes, hypertension, atherosclerosis and heart failure.  He was initially seen by Dr. Cameron Ali as a referral from Dr. Hubert Azure on June 22, 2014, and at that time his most recent creatinine level was 1.7.  He is here today in scheduled followup.    SUBJECTIVE:  The patient presents to clinic today accompanied by his sister.  He tells me he is doing very well and has no complaints or issues.  He denies chest pain or shortness of breath.  No lower extremity edema currently, he did in the past before and his pacemaker was placed.  Appetite is excellent.  No nausea or vomiting.  No fever, chills or hospitalizations.  He denies dysuria or hematuria.  He feels that urine output is adequate with fluid intake.    MEDICATIONS:   Current Outpatient Prescriptions   Medication Sig    Amiodarone (PACERONE) 200 mg Oral Tablet Take 1 Tab (200 mg total) by mouth Once a day    apixaban (ELIQUIS) 5 mg Oral Tablet Take 5 mg by mouth Twice daily    ascorbic acid (VITAMIN C) 500 mg Oral Tablet Take 500 mg by mouth Twice daily     carvedilol (COREG) 6.25 mg Oral Tablet Take 1 Tab (6.25 mg total) by mouth Twice daily with food    cholecalciferol, vitamin D3, 1,000 unit Oral Tablet Take 1,000 Units by mouth Twice daily    Coenzyme Q10 (CO Q-10) 100 mg Oral Capsule Take by mouth Once a day     cyanocobalamin (VITAMIN B12) 1,000 mcg/mL Injection Solution 1,000 mcg by Subcutaneous route Every 30 days    digoxin  (LANOXIN) 125 mcg Oral Tablet Take 0.125 mg by mouth Once a day    folic acid (FOLVITE) 1 mg Oral Tablet Take 1 mg by mouth Once a day    furosemide (LASIX) 40 mg Oral Tablet Take 40 mg by mouth Once a day    glipiZIDE (GLUCOTROL) 5 mg Oral Tablet Take 2.5 mg by mouth Every morning before breakfast Take 30 minutes before meals    Iron, Carbonyl 45 mg Oral Tablet Take by mouth Once a day    losartan (COZAAR) 50 mg Oral Tablet Take 25 mg by mouth Once a day     magnesium citrate 100 mg Oral Tablet Take by mouth Every 48 hours    metFORMIN (GLUCOPHAGE) 500 mg Oral Tablet Take 500 mg by mouth Twice daily with food    multivitamin Oral Tablet Take 1 Tab by mouth Twice daily     nitroglycerin (NITROSTAT) 0.4 mg Sublingual Tablet, Sublingual 1 Tab (0.4 mg total) by Sublingual route Every 5 minutes as needed for Chest pain. for 3 doses over 15 minutes    Omega-3 Fatty Acids-Vitamin E (FISH OIL) 1,000 mg Oral Capsule Take 1,000 mg by mouth Once a day    ranitidine (ZANTAC) 150 mg Oral Tablet Take 37.5 mg by mouth Once a day     spironolactone (ALDACTONE) 25 mg  Oral Tablet Take 1 Tab (25 mg total) by mouth Once a day    VIT A/VIT C/VIT E/ZINC/COPPER (ICAPS AREDS ORAL) Take by mouth Twice daily         OBJECTIVE:  [name] is a 74 year old Caucasian male who presents to the [facility] today accompanied by his sister.  He is ambulatory without difficulty and without the use of assistive devices.  He is in no acute distress, appears his stated age.  He is very pleasant and cooperative.  Vital signs:  Blood pressure 112/68, pulse 64, respirations 16, weight 202 pounds.  HEENT:  Head is normocephalic and atraumatic.  Eyes are anicteric.  He is wearing eyeglasses.  Mucous membranes moist.  Cardiac:  Normal S1, S2, regular rate and rhythm.  Pulmonary:  Lungs are clear.  He displays good respiratory effort.  Abdomen:  Soft, nontender.  Skin:  Warm, dry and intact.  Extremities:  No lower extremity  edema.  Psych:  Alert and oriented with normal mood and affect and appropriate response.  Neurologic:  No tremors or asterixis.    LABORATORY DATA:    Lab Results   Component Value Date    BUN 30 08/21/2014    BUN 19 07/04/2014    BUN 24 05/19/2014    CREATININE 1.7 08/21/2014    CREATININE 1.62* 07/04/2014    CREATININE 1.7 05/19/2014    BUNCRRATIO 17.65 08/21/2014    BUNCRRATIO 12 07/04/2014    BUNCRRATIO 14.12 05/19/2014    GFR 40 08/21/2014    GFR 45* 07/04/2014    GFR 40 05/19/2014    SODIUM 138 08/21/2014    SODIUM 140 07/04/2014    SODIUM 139 05/19/2014    POTASSIUM 4.6 08/21/2014    POTASSIUM 4.1 07/04/2014    POTASSIUM 4.3 05/19/2014    CHLORIDE 104 08/21/2014    CHLORIDE 104 07/04/2014    CHLORIDE 104 05/19/2014    CO2 26 08/21/2014    CO2 26 07/04/2014    CO2 25 05/19/2014    ANIONGAP 10 07/04/2014    ANIONGAP 9 01/10/2014    ANIONGAP 11 11/21/2013    CALCIUM 9.0 08/21/2014    CALCIUM 9.6 07/04/2014    CALCIUM 9.0 05/19/2014    PHOSPHORUS 2.6 08/21/2014    PHOSPHORUS 4.1 05/19/2014    PHOSPHORUS 3.4 09/06/2011    ALBUMIN 3.9 07/04/2014    HGB 13.0 08/21/2014    HGB 13.5 07/04/2014    HGB 12.5 05/19/2014    HCT 38.9 08/21/2014    HCT 40.0 07/04/2014    HCT 37.0 05/19/2014    INTACTPTH 132.4* 09/05/2014    INTACTPTH 120.8* 07/04/2014    IRON 105 08/21/2014    IRON 109 05/19/2014    IRONBINDCAP 406 08/21/2014    IRONBINDCAP 414 05/19/2014    FERRITIN 62 08/21/2014    FERRITIN 55 05/19/2014    25HYDVITD3 49.0 07/04/2014    HA1C 8.9 08/21/2014    HA1C 6.2 05/19/2014    HA1C 7.5* 11/21/2013         RADIOLOGY DATA:  Renal ultrasound from [facility] dated March 23, 2014, indicates right kidney at 9.3 cm with no evidence of hydronephrosis, renal calculi or renal cortical thinning.  Left kidney 12.4 cm and it demonstrates flow with no evidence of hydronephrosis, renal calculi or renal cortical thinning.  There is a calculus in the posterior aspect of the bladder measuring 1.6 cm x 2  cm.    ASSESSMENT/PLAN:  1.  Chronic kidney  disease stage III with baseline creatinines ranging 1.4-1.7 and most likely etiology multifactorial in etiology with uncontrolled diabetes, hypertension and atherosclerosis and heart failure.  His most recent creatinine level indicates stability at 1.7.  We will continue to monitor with quarterly labs and clinic visits.  I did remind him on the importance of complete avoidance of NSAIDs and IV contrast dyes if at all possible and he verbalized understanding.  2.  Lower extremity edema, none current.  The patient may actually be a little bit hypovolemic and seems to be somewhat dry and complains of excessive thirst.  At this point in time, I will have him decrease his Lasix to 20 mg daily and only take 40 mg tablet of Lasix if needed.  I also advised him to be very cautious with sodium intake, in which he verbalized understanding.  3.  Hypertension.  Blood pressure adequately controlled.  No changes in medications to be made at this time.  4.  Diabetes mellitus, inadequately controlled.  Last A1c above goal at 8.9.  He will continue to follow with his primary care provider for management of diabetes with goal A1c less than 7%.  He is currently on metformin.  If his GFR were to decrease any further this would need to be stopped due to risk of metabolic acidosis.  His most recent urine test was negative for protein.  He is on an ARB which he will continue.  5.  Disposition.  He will return to the [facility] in 3 months for reevaluation of above-mentioned conditions.  He will have lab work completed prior to that visit and was given a lab slip today.    The patient was seen independently.      Candie Chroman, ANP  Section of Nephrology  [facility] Department of Medicine    [id number]; D: 11/07/2014 12:03:38; T: 11/07/2014 14:24:06    cc: Nehemiah Settle MD      [address]       [place], [place] [address]

## 2014-11-08 ENCOUNTER — Other Ambulatory Visit (INDEPENDENT_AMBULATORY_CARE_PROVIDER_SITE_OTHER): Payer: Self-pay | Admitting: FAMILY PRACTICE

## 2014-11-28 LAB — ENTER/EDIT NEPHROLOGY EXTERNAL LABS
25-HYDROXY VITAMIN D: 28.5
ALBUMIN (SERUM): 3.9
BUN: 30 — ABNORMAL HIGH
CALCIUM: 9.1
CARBON DIOXIDE: 23
CHLORIDE: 106
CREATININE: 1.9 — ABNORMAL HIGH
ESTIMATED GLOMERULAR FILTRATION RATE: 35
ESTIMATED GLOMERULAR FILTRATION RATE: 35
HCT: 38.1 — ABNORMAL LOW
HGB: 12.4 — ABNORMAL LOW
INTACT PTH: 81.4
PHOSPHORUS: 4.2
POTASSIUM: 4.3
SODIUM: 137

## 2014-12-06 ENCOUNTER — Encounter (INDEPENDENT_AMBULATORY_CARE_PROVIDER_SITE_OTHER): Payer: Self-pay | Admitting: Family

## 2014-12-19 ENCOUNTER — Encounter (INDEPENDENT_AMBULATORY_CARE_PROVIDER_SITE_OTHER): Payer: Medicare Other | Admitting: Family

## 2015-01-30 ENCOUNTER — Other Ambulatory Visit (INDEPENDENT_AMBULATORY_CARE_PROVIDER_SITE_OTHER): Payer: Self-pay

## 2015-01-30 MED ORDER — AMIODARONE 200 MG TABLET
200.0000 mg | ORAL_TABLET | Freq: Every day | ORAL | Status: DC
Start: 2015-01-30 — End: 2016-01-16

## 2015-02-12 ENCOUNTER — Other Ambulatory Visit (INDEPENDENT_AMBULATORY_CARE_PROVIDER_SITE_OTHER): Payer: Self-pay | Admitting: Internal Medicine

## 2015-03-02 IMAGING — CT CT ANGIO CHEST
2 of 6 series · 19 of 36 positions shown · IV contrast (OMNIPAQUE)
Comparison: None.

CLINICAL DATA: Decreased oxygen saturation

EXAM:
CT ANGIOGRAPHY CHEST WITH CONTRAST
TECHNIQUE: Multidetector CT imaging of the chest was performed using the
standard protocol during bolus administration of intravenous
contrast. Multiplanar CT image reconstructions including MIPs were
obtained to evaluate the vascular anatomy.
CONTRAST:  100mL OMNIPAQUE IOHEXOL 350 MG/ML SOLN

[Series 6: pe thins @ 1mm · axial · 0.76mm/px · z∈[-315,-49]mm · 18 of 296 slices shown]
[im 15/296  lung]
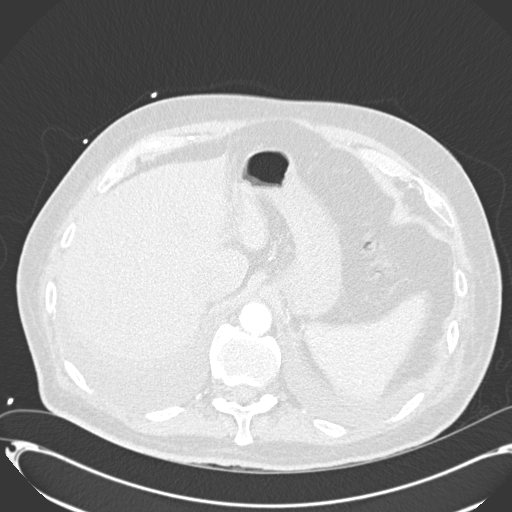
[im 30/296  mediastinal]
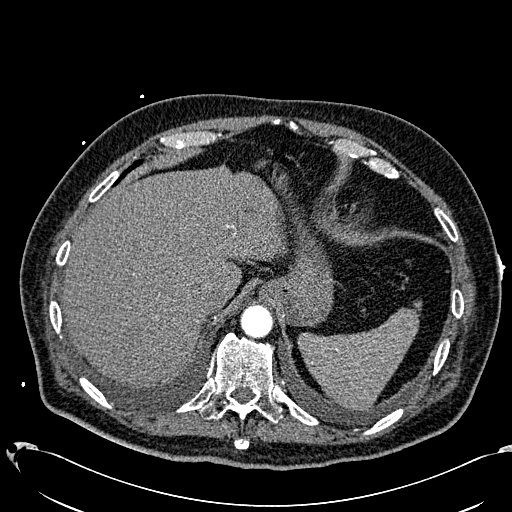
[im 45/296  lung]
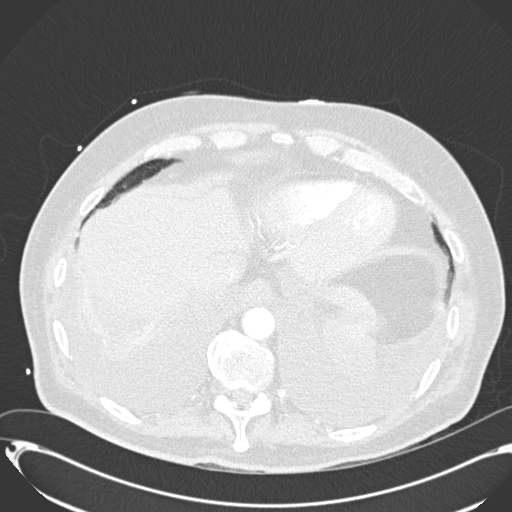
[im 60/296  mediastinal]
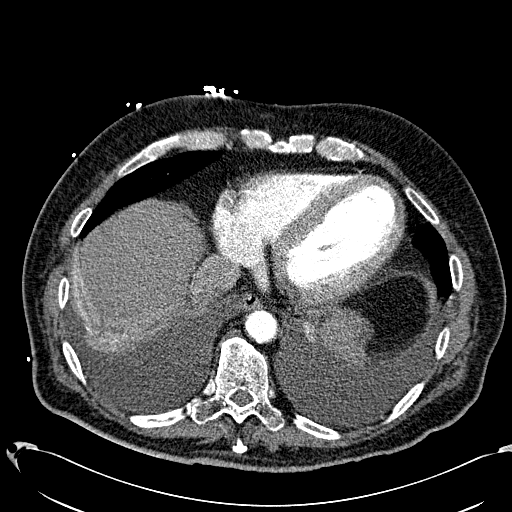
[im 74/296  lung]
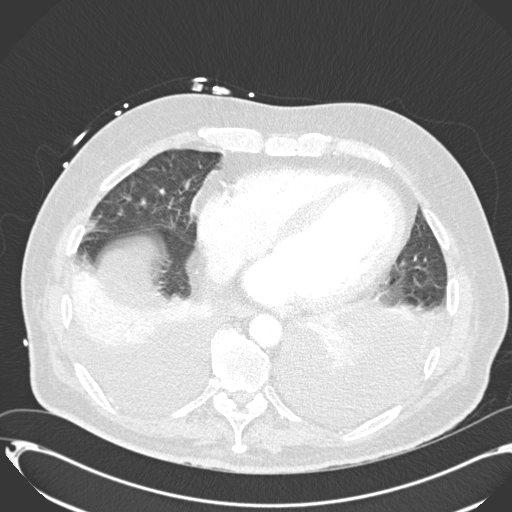
[im 89/296  mediastinal]
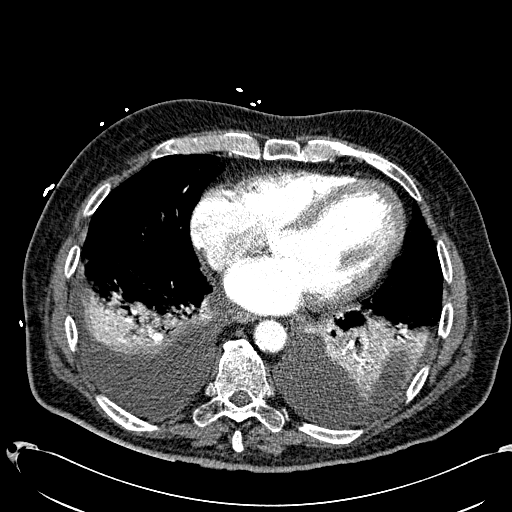
[im 104/296  lung]
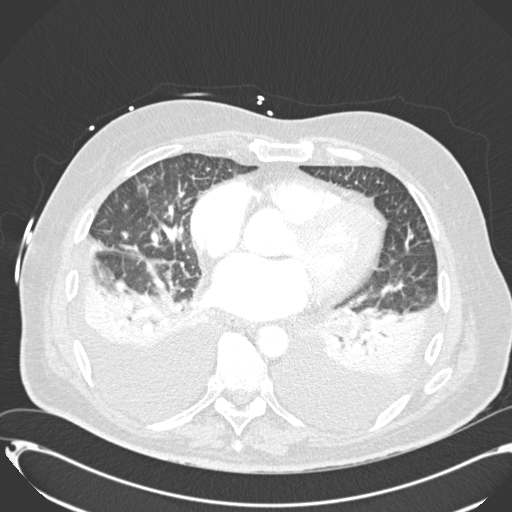
[im 119/296  mediastinal]
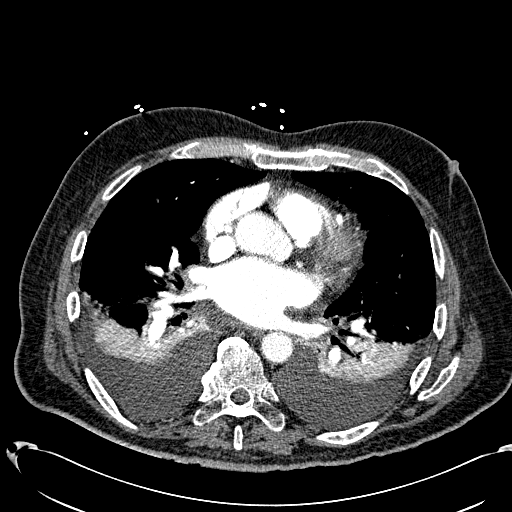
[im 133/296  lung]
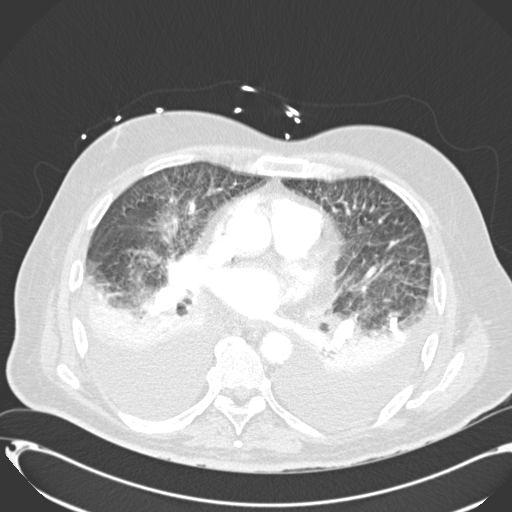
[im 163/296  mediastinal]
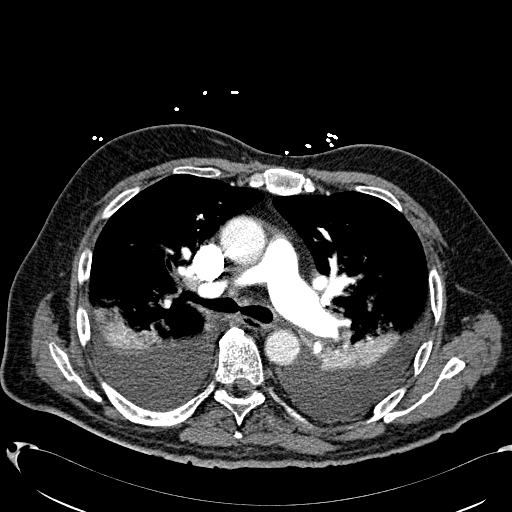
[im 178/296  lung]
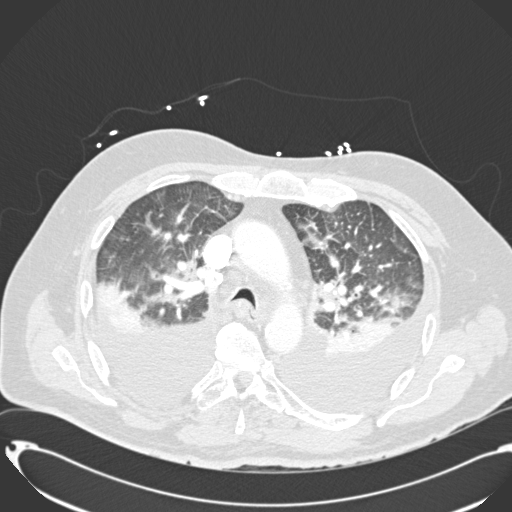
[im 192/296  mediastinal]
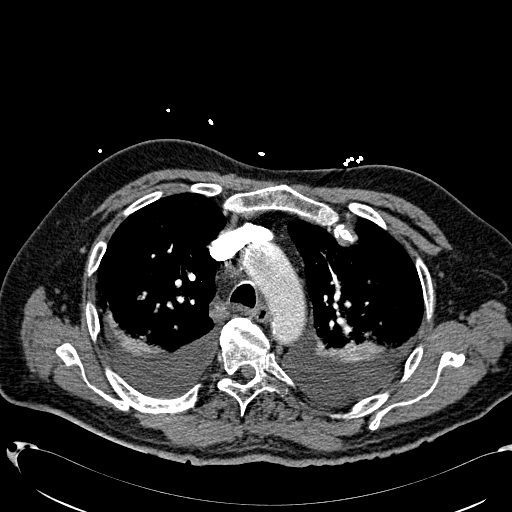
[im 207/296  lung]
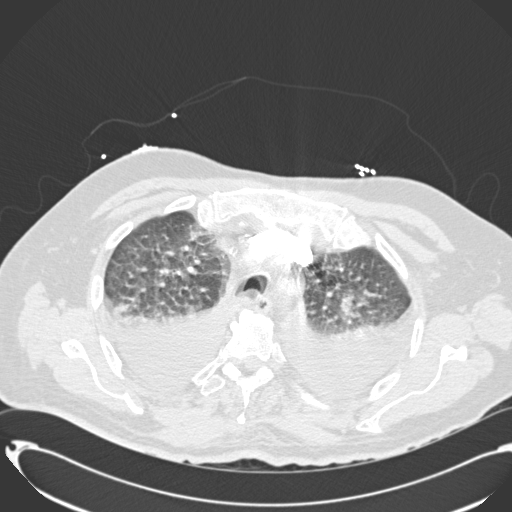
[im 222/296  mediastinal]
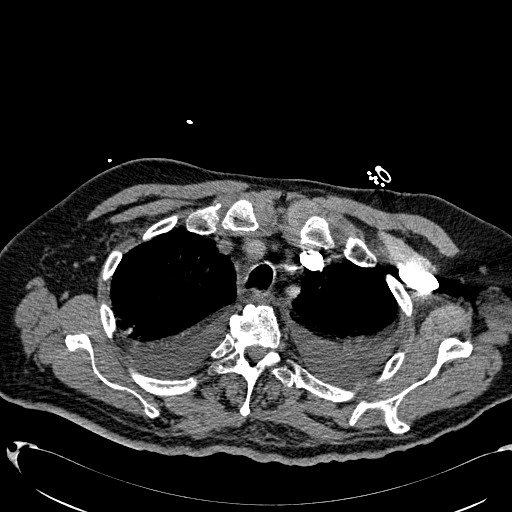
[im 237/296  lung]
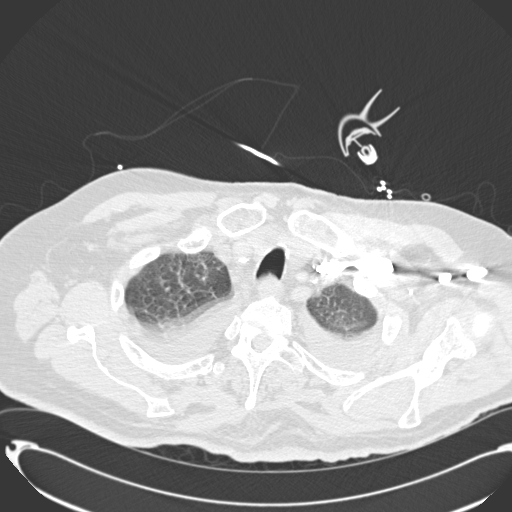
[im 251/296  mediastinal]
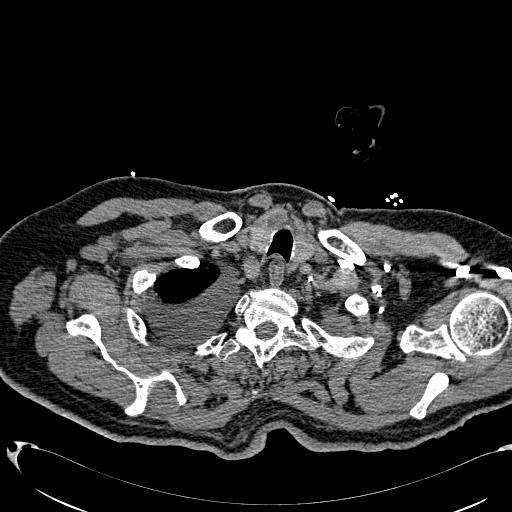
[im 266/296  lung]
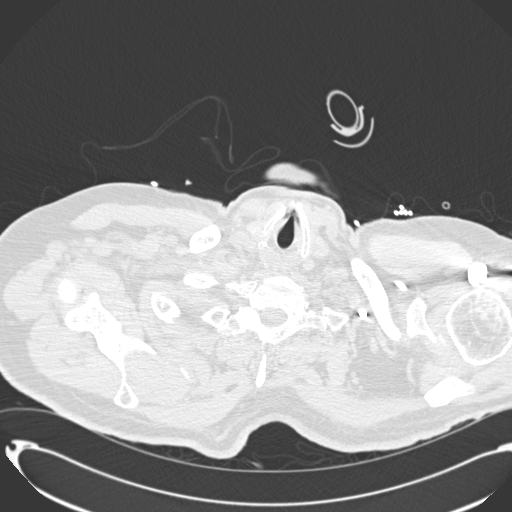
[im 281/296  mediastinal]
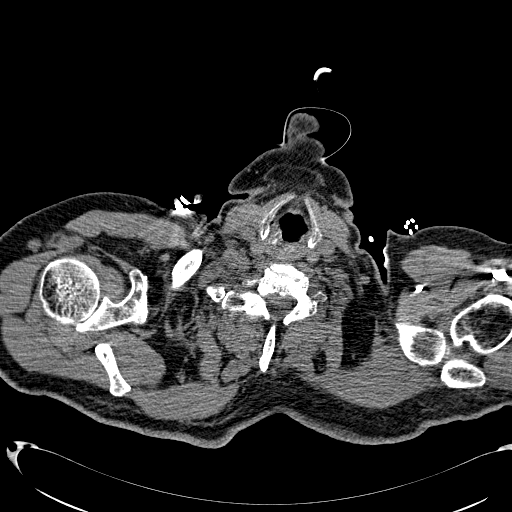

[Series 602: <mpr thick range> · coronal · 0.76mm/px · 1 of 142 slices shown]
[im 71/142  mediastinal]
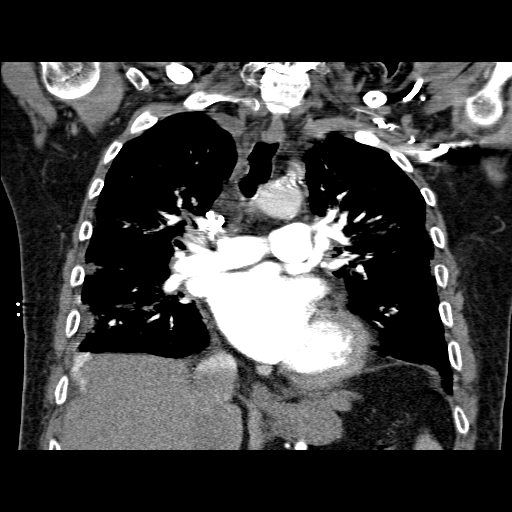

[19 of 36 positions shown; findings below may reference images not displayed]

FINDINGS: There are no filling defects in the pulmonary arterial tree to
suggest acute pulmonary thromboembolism.

2.0 cm right thyroid nodule.

No abnormal mediastinal adenopathy. Coronary artery calcifications
are noted.

Moderate size bilateral pleural effusions. Compressive atelectasis
in both lower lobes as well as the posterior upper lobes.

No acute bony deformity.

Review of the MIP images confirms the above findings.
IMPRESSION: No evidence of acute pulmonary thromboembolism.

Moderate bilateral pleural effusions.

## 2015-03-05 ENCOUNTER — Other Ambulatory Visit (INDEPENDENT_AMBULATORY_CARE_PROVIDER_SITE_OTHER): Payer: Self-pay

## 2015-03-05 MED ORDER — FUROSEMIDE 40 MG TABLET
40.0000 mg | ORAL_TABLET | Freq: Every day | ORAL | Status: DC
Start: 2015-03-05 — End: 2015-04-05

## 2015-03-06 ENCOUNTER — Encounter (INDEPENDENT_AMBULATORY_CARE_PROVIDER_SITE_OTHER): Payer: Medicare Other | Admitting: Family

## 2015-03-13 LAB — ENTER/EDIT NEPHROLOGY EXTERNAL LABS
25-HYDROXY VITAMIN D: 42.9
ALBUMIN (SERUM): 3.8
ALKALINE PHOSPHATASE: 77
ALT (SGPT): 19
AST (SGOT): 25
BUN/CREAT RATIO: 14.12
BUN/CREAT RATIO: 14.12
BUN: 24
CALCIUM: 9.5
CALCIUM: 9.5
CARBON DIOXIDE: 23
CARBON DIOXIDE: 23
CHLORIDE: 108
CHOLESTEROL: 226
CREATININE: 1.7
ESTIMATED GLOMERULAR FILTRATION RATE: 40
GLUCOSE, FASTING: 146
HCT: 39.9
HCT: 39.9
HDL-CHOLESTEROL: 37
HGB: 13.7
IRON BINDING CAPACITY: 411
PLATELET COUNT: 154
POTASSIUM: 4.3
SODIUM: 141
TOTAL PROTEIN: 6
TRIGLYCERIDES: 188
WBC: 6.4

## 2015-04-04 ENCOUNTER — Other Ambulatory Visit (INDEPENDENT_AMBULATORY_CARE_PROVIDER_SITE_OTHER): Payer: Self-pay

## 2015-04-05 ENCOUNTER — Ambulatory Visit (INDEPENDENT_AMBULATORY_CARE_PROVIDER_SITE_OTHER): Payer: Medicare Other | Admitting: Internal Medicine

## 2015-04-05 ENCOUNTER — Encounter (INDEPENDENT_AMBULATORY_CARE_PROVIDER_SITE_OTHER): Payer: Self-pay | Admitting: Internal Medicine

## 2015-04-05 VITALS — BP 146/80 | HR 80 | Resp 16 | Ht 72.0 in | Wt 209.7 lb

## 2015-04-05 DIAGNOSIS — N183 Chronic kidney disease, stage 3 (moderate): Secondary | ICD-10-CM

## 2015-04-05 DIAGNOSIS — E119 Type 2 diabetes mellitus without complications: Secondary | ICD-10-CM

## 2015-04-05 DIAGNOSIS — I480 Paroxysmal atrial fibrillation: Secondary | ICD-10-CM

## 2015-04-05 DIAGNOSIS — I2510 Atherosclerotic heart disease of native coronary artery without angina pectoris: Secondary | ICD-10-CM

## 2015-04-05 DIAGNOSIS — I447 Left bundle-branch block, unspecified: Secondary | ICD-10-CM

## 2015-04-05 DIAGNOSIS — I5030 Unspecified diastolic (congestive) heart failure: Secondary | ICD-10-CM

## 2015-04-05 DIAGNOSIS — Z95810 Presence of automatic (implantable) cardiac defibrillator: Secondary | ICD-10-CM

## 2015-04-05 DIAGNOSIS — I255 Ischemic cardiomyopathy: Secondary | ICD-10-CM

## 2015-04-05 DIAGNOSIS — I10 Essential (primary) hypertension: Secondary | ICD-10-CM

## 2015-04-05 NOTE — Progress Notes (Addendum)
OV dictated    Suzanne L. Brown, PA-C    I have seen and evaluated the patient jointly with Suzanne Brown PA-C and agree with her assessment, recommendations and plans.      Nayelly Laughman, MD 04/08/2015 10:14

## 2015-04-05 NOTE — Progress Notes (Addendum)
[facility]  [address] [place], [place] [address]    PROGRESS NOTE    PATIENT NAME: [name], [name]  CHART NUMBER: [id number]  DATE OF BIRTH:   DATE OF SERVICE: 04/05/2015    [facility]  [address]  [place], [place]  [address]    SUBJECTIVE:  This is a 74 year old white male who is followed in the clinic with a history of coronary artery disease, stenting of his LAD as well as RCA in 2013.  Also, severe ischemic cardiomyopathy with ICD implant that is followed by Dr. Jackquline Bosch.  He has a left bundle branch block as well as paroxysmal atrial fibrillation, maintaining sinus rhythm on Amiodarone therapy and as well long-term anticoagulation with Eliquis.  Risk factors also include diabetes mellitus, noninsulin dependent and hypertension.  He previously had class III-IV congestive heart failure, now class I.    In the office today, he states, "I've never felt better."  He has increased exercise capacity.  Denies dyspnea upon exertion.  No shortness of breath at rest, orthopnea, PND or peripheral edema.  Denies chest pain.    ALLERGIES:  LIPITOR, PRAVASTATIN.     MEDICATIONS:  1.  Pacerone 200 mg p.o. q. day.  2.  Eliquis 5 mg b.i.d.  3.  Vitamin C 500 mg b.i.d.  4.  Coreg 6.25 mg p.o. b.i.d.  5.  D3 5000 units q. day.  6.  CoQ10 daily.  7.  Vitamin B12 1000 mcg subcutaneous q.30 days.  8.  Lanoxin 125 mcg q. day.  9.  Folvite 1 mg q. day.  10.  Lasix 20 mg q. day.  11.  Glucotrol 5 mg b.i.d.  12.  Iron 45 mg q. day.  13.  Cozaar 50 mg 1/2 tablet p.o. q. day.  14.  Magnesium citrate 100 mg q. day.  15.  Glucophage 500 mg b.i.d.  16.  Multivitamin daily.  17.  Fish oil 1000 mg q. day.  18.  Zantac 150 mg q. day.  19.  Aldactone 25 mg q. day.  20.  Vitamin A, C, E, zinc and copper b.i.d.    REVIEW OF SYSTEMS:  No fever or chills.  No headache, syncope or near syncopal event.  Denies chest pain, shortness of breath at rest or dyspnea upon exertion.  No orthopnea or PND.   No palpitations.  No cough or hemoptysis.  No abdominal pain, nausea, vomiting, diarrhea or change in either bowel or bladder habits.  No melena or tarry stool.  No edema nor known weight change.    OBJECTIVE:  On physical exam, blood pressure 146/80, pulse is 80 and regular, respirations 16, even and nonlabored.  Height is 6 feet 0 inch, weight 209 pounds and SpO2 is 98% on room air.  On general exam, he is alert and oriented x3.  HEENT is normocephalic, atraumatic.  Sclerae is clear, nonicteric.  Neck is supple without adenopathy, JVD or bruit.  There is no thyromegaly.  Lungs are clear to auscultation with equal air entry bilaterally.  Heart is regular rate without audible murmur, gallop or rub.  Abdomen is soft, nontender, nondistended without hepatosplenomegaly or palpable mass.  There are positive bowel sounds throughout.  Extremities are without clubbing, cyanosis or edema as well as symmetrical pulses.    ASSESSMENT:  1.  Coronary artery disease with previous stent in the LAD and RCA, which is stable.  2.  Ischemic cardiomyopathy with ICD implant followed by  Dr. Jackquline Bosch.  3.  Paroxysmal atrial fibrillation, maintaining sinus rhythm on amiodarone therapy.  4.  Long-term anticoagulation with Eliquis.  5.  Left bundle branch block.  6.  Diabetes mellitus, noninsulin dependent.  7.  Hypertension.  8.  Hyperlipidemia.  9.  Class I congestive heart failure.    PLAN:  1.  He has had recent Amiodarone screening, by his and his sister's report, through Dr. Dallas Schimke office, as well recent cataract extraction and normal eye exam by their report.  2.  He will continue on present medications today without change including his amiodarone, Eliquis, Coreg, Lanoxin, Lasix, Cozaar and Aldactone.  3.  Risk factor modification including hemoglobin A1c less than 6.5.  4.  We will see him back in the clinic in 6 months, sooner if needed.      Gweneth Fritter, PA-C  Section of Cardiology  [facility] Department of  Medicine    Linward Natal, MD  Assistant Professor, Section of Cardiology  [facility] Department of Medicine    [id number]; D: 04/05/2015 12:38:14; T: 04/05/2015 23:55:47    cc: Nehemiah Settle MD      [address]       [place], [place] [address]    I have seen and evaluated the patient jointly with Herbie Saxon PA-C and agree with her assessment, recommendations and plans. CHF with remarkable improvement in symptoms will continue present meds.      Vincent Gros, MD 04/08/2015 10:13

## 2015-04-10 ENCOUNTER — Ambulatory Visit: Payer: Medicare Other

## 2015-04-10 DIAGNOSIS — Z4502 Encounter for adjustment and management of automatic implantable cardiac defibrillator: Secondary | ICD-10-CM | POA: Insufficient documentation

## 2015-04-10 DIAGNOSIS — I255 Ischemic cardiomyopathy: Secondary | ICD-10-CM | POA: Insufficient documentation

## 2015-04-10 DIAGNOSIS — I509 Heart failure, unspecified: Secondary | ICD-10-CM | POA: Insufficient documentation

## 2015-04-10 DIAGNOSIS — Z95810 Presence of automatic (implantable) cardiac defibrillator: Secondary | ICD-10-CM

## 2015-04-10 DIAGNOSIS — I447 Left bundle-branch block, unspecified: Secondary | ICD-10-CM | POA: Insufficient documentation

## 2015-04-25 NOTE — Procedures (Addendum)
Patient's ICD was checked by remote transmission.    Patient's ICD was evaluated, typically including programmed   parameters, lead(s), battery, capture, sensing function(s), presence or   absence of therapies, and underlying heart rhythm. Often but not always   this includes sensor rate response, lower and upper heart rates, AV   intervals, pacing voltage and pulse duration, sensing value, and   diagnostics.  Please see programmer printout for details (scanned document).    SJM #[id number]  CRT-D.  Mode=DDD@70  bpm.  Battery status etr=5.1 years.  A: 460 ohms, P=>5 mV, threshold=0.875V@0 .30ms  [output=auto ].  RV: 360 ohms, R=nt, threshold=nt  [output=2.5V and 0.68ms ].  LV: 760 ohms, threshold=nt  [output=2.5V and 0.45ms ].  Ap=41% ,Vp=>99% .  VF detections=200 bpm. No recent vt/vf episodes.  No new alerts.  Normal device check.  Will recheck again in 6 months from home by remote transmission.  Darlyn Chamber, RN 04/08/2015    PACEMAKER OR DEFIBRILLATOR EVALUATION (INTERROGATION EVALUATION OR PROGRAMMING EVALUATION), OR ANALYSIS OF REMOTE MONITORING, AS APPLICABLE    Order:    See patient's medical record.    Procedure Description:    Standard programming evaluation (or interrogation evaluation) using device programmer, or standard analysis of remote monitoring, as appropriate.    Data recording:    See printouts, where available, for details.    Physician Analysis and Reporting:    Unremarkable device analysis. No major abnormalities, except as otherwise noted, if applicable.      Plan:    Plan routine follow-up, as appropriate, or unless otherwise indicated.  Please see notes and/or scanned printouts (where available) for details.      Chanetta Marshall, MD 04/26/2015, 09:57

## 2015-05-01 ENCOUNTER — Encounter (INDEPENDENT_AMBULATORY_CARE_PROVIDER_SITE_OTHER): Payer: Self-pay | Admitting: Family

## 2015-05-01 ENCOUNTER — Ambulatory Visit (INDEPENDENT_AMBULATORY_CARE_PROVIDER_SITE_OTHER): Payer: Medicare Other | Admitting: Family

## 2015-05-01 VITALS — BP 138/78 | HR 78 | Resp 14 | Ht 72.0 in | Wt 218.0 lb

## 2015-05-01 DIAGNOSIS — E1122 Type 2 diabetes mellitus with diabetic chronic kidney disease: Secondary | ICD-10-CM

## 2015-05-01 DIAGNOSIS — N183 Chronic kidney disease, stage 3 (moderate): Secondary | ICD-10-CM

## 2015-05-01 DIAGNOSIS — I10 Essential (primary) hypertension: Secondary | ICD-10-CM

## 2015-05-01 DIAGNOSIS — E119 Type 2 diabetes mellitus without complications: Secondary | ICD-10-CM

## 2015-05-01 NOTE — Progress Notes (Addendum)
 [facility] Department of Medicine  [address]  [place], [place] [address]      PROGRESS NOTE    PATIENT NAME: [name], [name]  CHART NUMBER: [id number]  DATE OF BIRTH:   DATE OF SERVICE:  05/01/2015     IDENTIFICATION:  This is a 74 year old Caucasian male who is being followed in the [facility] for what appears to be chronic kidney disease stage III with baseline creatinines ranging 1.4-1.7 for the past year, most likely multifactorial in etiology with uncontrolled diabetes, hypertension, atherosclerosis and heart failure.  I last saw him on November 07, 2014, and at that time his most recent creatinine level was 1.7.  He is here today in scheduled followup.    SUBJECTIVE:  The patient presents to clinic today accompanied by his sister.  He tells me he is doing very well and has no complaints or issues.  Did have cataracts removed from his yes recently.  Vision much improved.  He denies chest pain or shortness of breath.  No lower extremity edema currently, he did in the past before and his pacemaker was placed.  Appetite is excellent.  No nausea or vomiting.  No fever, chills or hospitalizations.  He denies dysuria or hematuria.  He feels that urine output is adequate with fluid intake.  Home BPs range in the 120's/70s.    MEDICATIONS:   Current Outpatient Prescriptions   Medication Sig    amiodarone (PACERONE) 200 mg Oral Tablet Take 1 Tab (200 mg total) by mouth Once a day    apixaban (ELIQUIS) 5 mg Oral Tablet Take 5 mg by mouth Twice daily    ascorbic acid (VITAMIN C) 500 mg Oral Tablet Take 500 mg by mouth Twice daily     carvedilol (COREG) 6.25 mg Oral Tablet TAKE ONE TABLET BY MOUTH TWICE DAILY WITH FOOD    cholecalciferol, vitamin D3, 1,000 unit Oral Tablet Take 5,000 Units by mouth Once a day     Coenzyme Q10 (CO Q-10) 100 mg Oral Capsule Take by mouth Once a day     cyanocobalamin (VITAMIN B12) 1,000 mcg/mL Injection Solution 1,000 mcg by Subcutaneous route  Every 30 days    digoxin (LANOXIN) 125 mcg Oral Tablet Take 0.125 mg by mouth Once a day    folic acid (FOLVITE) 1 mg Oral Tablet Take 1 mg by mouth Once a day    furosemide (LASIX) 20 mg Oral Tablet Take 20 mg by mouth Once a day    glipiZIDE (GLUCOTROL) 5 mg Oral Tablet Take 5 mg by mouth Twice a day before meals Take 30 minutes before meals    Iron, Carbonyl 45 mg Oral Tablet Take by mouth Once a day    losartan (COZAAR) 50 mg Oral Tablet Take 25 mg by mouth Once a day     magnesium citrate 100 mg Oral Tablet Take by mouth Once a day     metFORMIN (GLUCOPHAGE) 500 mg Oral Tablet Take 500 mg by mouth Twice daily with food    multivitamin Oral Tablet Take 1 Tab by mouth Twice daily     nitroglycerin (NITROSTAT) 0.4 mg Sublingual Tablet, Sublingual 1 Tab (0.4 mg total) by Sublingual route Every 5 minutes as needed for Chest pain. for 3 doses over 15 minutes    Omega-3 Fatty Acids-Vitamin E (FISH OIL) 1,000 mg Oral Capsule Take 1,000 mg by mouth Once a day    ranitidine (ZANTAC) 150 mg Oral Tablet Take 150 mg by  mouth Once a day     spironolactone (ALDACTONE) 25 mg Oral Tablet Take 1 Tab (25 mg total) by mouth Once a day    VIT A/VIT C/VIT E/ZINC/COPPER (ICAPS AREDS ORAL) Take by mouth Twice daily         OBJECTIVE:  [name] is a 74 year old Caucasian male who presents to the [facility] today accompanied by his sister.  He is ambulatory without difficulty and without the use of assistive devices.  He is in no acute distress, appears his stated age.  He is very pleasant and cooperative.    Filed Vitals:    05/01/15 1105   BP: 138/78   Pulse: 78   Resp: 14   Height: 1.829 m (6')   Weight: 98.884 kg (218 lb)     HEENT:  Head is normocephalic and atraumatic.  Eyes are anicteric.  He is wearing eyeglasses.  Mucous membranes moist.  Cardiac:  Normal S1, S2, regular rate and rhythm.  +Pacemaker.  Pulmonary:  Lungs are clear.  He displays good respiratory effort.  Abdomen:  Soft,  nontender.  Skin:  Warm, dry and intact.  Extremities:  No lower extremity edema.  Psych:  Alert and oriented with normal mood and affect and appropriate response.  Neurologic:  No tremors or asterixis.    LABORATORY DATA:    Lab Results   Component Value Date    BUN 24 03/13/2015    BUN 30* 11/28/2014    BUN 30 08/21/2014    CREATININE 1.7 03/13/2015    CREATININE 1.9* 11/28/2014    CREATININE 1.7 08/21/2014    BUNCRRATIO 14.12 03/13/2015    BUNCRRATIO 17.65 08/21/2014    BUNCRRATIO 12 07/04/2014    GFR 40 03/13/2015    GFR 35 11/28/2014    GFR 40 08/21/2014    SODIUM 141 03/13/2015    SODIUM 137 11/28/2014    SODIUM 138 08/21/2014    POTASSIUM 4.3 03/13/2015    POTASSIUM 4.3 11/28/2014    POTASSIUM 4.6 08/21/2014    CHLORIDE 108 03/13/2015    CHLORIDE 106 11/28/2014    CHLORIDE 104 08/21/2014    CO2 23 03/13/2015    CO2 23 11/28/2014    CO2 26 08/21/2014    ANIONGAP 10 07/04/2014    ANIONGAP 9 01/10/2014    ANIONGAP 11 11/21/2013    CALCIUM 9.5 03/13/2015    CALCIUM 9.1 11/28/2014    CALCIUM 9.0 08/21/2014    PHOSPHORUS 4.2 11/28/2014    PHOSPHORUS 2.6 08/21/2014    PHOSPHORUS 4.1 05/19/2014    ALBUMIN 3.9 07/04/2014    HGB 13.7 03/13/2015    HGB 12.4* 11/28/2014    HGB 13.0 08/21/2014    HCT 39.9 03/13/2015    HCT 38.1* 11/28/2014    HCT 38.9 08/21/2014    INTACTPTH 81.4 11/28/2014    INTACTPTH 132.4* 09/05/2014    INTACTPTH 120.8* 07/04/2014    IRON 105 08/21/2014    IRON 109 05/19/2014    IRONBINDCAP 411 03/13/2015    IRONBINDCAP 406 08/21/2014    IRONBINDCAP 414 05/19/2014    FERRITIN 41 03/13/2015    FERRITIN 62 08/21/2014    FERRITIN 55 05/19/2014    [id number] 49.0 07/04/2014    HA1C 7.3 03/13/2015    HA1C 8.9 08/21/2014    HA1C 6.2 05/19/2014         RADIOLOGY DATA:  Renal ultrasound from [facility] dated March 23, 2014, indicates right kidney at 9.3 cm with no evidence  of hydronephrosis, renal calculi or renal cortical thinning.  Left kidney 12.4 cm and it demonstrates flow with no evidence  of hydronephrosis, renal calculi or renal cortical thinning.  There is a calculus in the posterior aspect of the bladder measuring 1.6 cm x 2 cm.    ASSESSMENT/PLAN:  1.  Chronic kidney disease stage III with baseline creatinines ranging 1.4-1.7 and most likely etiology multifactorial in etiology with uncontrolled diabetes, hypertension and atherosclerosis and heart failure.  His most recent creatinine level indicates stability at 1.7.  We will continue to monitor with quarterly labs and biannual clinic visits.  I did remind him on the importance of complete avoidance of NSAIDs and IV contrast dyes if at all possible and he verbalized understanding.  2.  Lower extremity edema, much improved and minimal. Continue lasix 20 mg daily.    3.  Hypertension.  Blood pressure adequately controlled.  No changes in medications to be made at this time.  4.  Diabetes mellitus, inadequately controlled.  Last A1c above goal at 7.3, although this is down from previous reading of 8.9.  He will continue to follow with his primary care provider for management of diabetes with goal A1c less than 7%.  He is currently on metformin.  If his GFR were to decrease any further this would need to be stopped due to risk of metabolic acidosis.  His most recent urine test was negative for protein.  He is on an ARB which he will continue.  5.  Disposition.  He will return to the [facility] in 6 months for reevaluation of above-mentioned conditions.  He will have lab work completed in the interim and will be seen sooner, if indicated.    The patient was seen independently.      Candie Chroman, ANP  Section of Nephrology  [facility] Department of Medicine

## 2015-05-04 ENCOUNTER — Encounter (HOSPITAL_BASED_OUTPATIENT_CLINIC_OR_DEPARTMENT_OTHER): Payer: Self-pay

## 2015-06-13 ENCOUNTER — Other Ambulatory Visit (INDEPENDENT_AMBULATORY_CARE_PROVIDER_SITE_OTHER): Payer: Self-pay | Admitting: Internal Medicine

## 2015-06-14 ENCOUNTER — Other Ambulatory Visit (INDEPENDENT_AMBULATORY_CARE_PROVIDER_SITE_OTHER): Payer: Self-pay | Admitting: Internal Medicine

## 2015-06-14 MED ORDER — RIVAROXABAN 20 MG TABLET
20.00 mg | ORAL_TABLET | Freq: Every evening | ORAL | Status: DC
Start: 2015-06-14 — End: 2015-06-14

## 2015-06-14 MED ORDER — RIVAROXABAN 20 MG TABLET
20.0000 mg | ORAL_TABLET | Freq: Every evening | ORAL | Status: DC
Start: 2015-06-14 — End: 2015-10-18

## 2015-06-15 ENCOUNTER — Ambulatory Visit (INDEPENDENT_AMBULATORY_CARE_PROVIDER_SITE_OTHER): Payer: Self-pay | Admitting: Internal Medicine

## 2015-06-15 NOTE — Telephone Encounter (Signed)
Called to patient and let him know that Xarelto 20 mg a day was sent to his pharmacy in place of the Eliquis and know it is ok to switch no significant difference. Pt voiced understanding CS LPn

## 2015-06-25 ENCOUNTER — Ambulatory Visit (INDEPENDENT_AMBULATORY_CARE_PROVIDER_SITE_OTHER): Payer: Self-pay | Admitting: Internal Medicine

## 2015-06-25 NOTE — Telephone Encounter (Signed)
Prior auth for Xarelto faxed to [facility] for approval.  BB,LPN

## 2015-07-04 ENCOUNTER — Encounter (INDEPENDENT_AMBULATORY_CARE_PROVIDER_SITE_OTHER): Payer: Self-pay | Admitting: Family

## 2015-07-04 LAB — ENTER/EDIT NEPHROLOGY EXTERNAL LABS
25-HYDROXY VITAMIN D: 45.6
ALKALINE PHOSPHATASE: 86
ALT (SGPT): 23
AST (SGOT): 23
BUN/CREAT RATIO: 13.89
BUN: 25
CALCIUM: 9.2
CARBON DIOXIDE: 23
CHLORIDE: 105
CREATININE: 1.8
ESTIMATED GLOMERULAR FILTRATION RATE: 37
FERRITIN: 77
GLUCOSE, FASTING: 237
HCT: 43.3
HEMOGLOBIN A1C: 9.5
HGB: 14.9
IRON BINDING CAPACITY: 427
IRON: 124
PLATELET COUNT: 156
POTASSIUM: 4.6
POTASSIUM: 4.6
SODIUM: 137
TOTAL PROTEIN: 6.1
TSH: 2.171
WBC: 6.7

## 2015-07-11 ENCOUNTER — Ambulatory Visit: Payer: Medicare Other | Attending: Internal Medicine

## 2015-07-11 DIAGNOSIS — I447 Left bundle-branch block, unspecified: Secondary | ICD-10-CM | POA: Insufficient documentation

## 2015-07-11 DIAGNOSIS — I255 Ischemic cardiomyopathy: Principal | ICD-10-CM

## 2015-07-11 DIAGNOSIS — Z95810 Presence of automatic (implantable) cardiac defibrillator: Secondary | ICD-10-CM | POA: Insufficient documentation

## 2015-07-11 DIAGNOSIS — I509 Heart failure, unspecified: Secondary | ICD-10-CM | POA: Insufficient documentation

## 2015-08-13 ENCOUNTER — Ambulatory Visit (INDEPENDENT_AMBULATORY_CARE_PROVIDER_SITE_OTHER): Payer: Self-pay | Admitting: Internal Medicine

## 2015-08-13 NOTE — Telephone Encounter (Signed)
-----   Message from Vincent Gros, MD sent at 08/11/2015  2:26 PM EST -----  ----- Message from Sheryn Bison sent at 08/08/2015  9:16 AM EST -----      ----- Message from Sheryn Bison sent at 08/08/2015  9:15 AM EST -----  [name] is having a growth removed on the roof of his mouth by Dr. Izola Price on Tuesday 08/14/15 and he needs to go off of his Xarelto and they need to know how long he needs to be off of this medication? Call [name] when you find out this information.     Thanks

## 2015-08-13 NOTE — Telephone Encounter (Signed)
Called to pt and let him know that he should stop his Xarelto 72 hours before his procedure, pt voiced understanding, CS LPN

## 2015-08-20 NOTE — Procedures (Addendum)
Patient's ICD was checked by remote transmission.    Patient's ICD was evaluated, typically including programmed   parameters, lead(s), battery, capture, sensing function(s), presence or   absence of therapies, and underlying heart rhythm. Often but not always   this includes sensor rate response, lower and upper heart rates, AV   intervals, pacing voltage and pulse duration, sensing value, and   diagnostics.  Please see programmer printout for details (scanned document).    SJM #[id number]  CRT-D.  Mode=DDD@70  bpm.  Battery status etr=5.0 years.  A: 510 ohms, P=>5 mV, threshold=1.0V@0 .42ms  [output=auto ].  RV: 390 ohms, R=> 12 mV, threshold=nt  [output=2.5V and 0.34ms ].  LV: 750 ohms, threshold=nt  [output=2.5V and 0.73ms ].  Ap=37% ,Vp=>99% .  VF detections=200 bpm. No recent vt/vf episodes.  No new alerts.  Normal device check.  Will recheck again in 6 months from home by remote transmission.  Darlyn Chamber, RN 07/11/2015      PACEMAKER PROGRAMMING EVALUATION    Order:  See patient's medical record.    Procedure Description:  Standard programming evaluation using pacemaker programmer.    Data recording:  See printouts, where available, for details.    Physician Analysis and Reporting:  Unremarkable device analysis.  No major abnormalities, except as otherwise noted, if applicable.    Plan:  Plan routine follow-up, as appropriate, or unless otherwise indicated.  Please see notes and/or scanned printouts (where available) for details.    Hazle Quant, MD  Cardiac Electrophysiology  [facility]

## 2015-09-28 ENCOUNTER — Other Ambulatory Visit (INDEPENDENT_AMBULATORY_CARE_PROVIDER_SITE_OTHER): Payer: Self-pay

## 2015-10-04 ENCOUNTER — Encounter (INDEPENDENT_AMBULATORY_CARE_PROVIDER_SITE_OTHER): Payer: Medicare Other | Admitting: Internal Medicine

## 2015-10-18 ENCOUNTER — Other Ambulatory Visit (INDEPENDENT_AMBULATORY_CARE_PROVIDER_SITE_OTHER): Payer: Self-pay | Admitting: Internal Medicine

## 2015-10-18 MED ORDER — RIVAROXABAN 15 MG TABLET
15.00 mg | ORAL_TABLET | Freq: Every evening | ORAL | 3 refills | Status: DC
Start: 2015-10-18 — End: 2016-03-14

## 2015-10-18 NOTE — Telephone Encounter (Signed)
Per Dr.Berzingi dosage was changed to 15 mg, CS LPN

## 2015-10-18 NOTE — Telephone Encounter (Signed)
Regarding: med refill  ----- Message from Sheryn Bison sent at 10/18/2015 11:05 AM EDT -----  [name] called and said that [name] needs a refill on his Xarelto and that Dr. Dellia Beckwith and Dr. Durel Salts had talked about cutting the dosage of the Xarelto from 20 to 15 mg so now she needs a new script sent to     Preferred Pharmacy     [facility] [address] [place] - [address]    [address] [place] [address]    Phone: [phone] Fax: [phone]    Not a 24 hour pharmacy; exact hours not known

## 2015-10-30 ENCOUNTER — Ambulatory Visit (INDEPENDENT_AMBULATORY_CARE_PROVIDER_SITE_OTHER): Payer: Medicare Other | Admitting: Family

## 2015-10-30 VITALS — BP 126/72 | HR 74 | Resp 15 | Ht 72.0 in | Wt 220.0 lb

## 2015-10-30 DIAGNOSIS — E119 Type 2 diabetes mellitus without complications: Secondary | ICD-10-CM

## 2015-10-30 DIAGNOSIS — D631 Anemia in chronic kidney disease: Secondary | ICD-10-CM

## 2015-10-30 DIAGNOSIS — N183 Chronic kidney disease, stage 3 (moderate): Secondary | ICD-10-CM

## 2015-10-30 DIAGNOSIS — I10 Essential (primary) hypertension: Secondary | ICD-10-CM

## 2015-10-30 DIAGNOSIS — D649 Anemia, unspecified: Secondary | ICD-10-CM

## 2015-10-30 DIAGNOSIS — I129 Hypertensive chronic kidney disease with stage 1 through stage 4 chronic kidney disease, or unspecified chronic kidney disease: Secondary | ICD-10-CM

## 2015-10-30 LAB — ENTER/EDIT NEPHROLOGY EXTERNAL LABS
25-HYDROXY VITAMIN D: 52
ALBUMIN (SERUM): 4
ALKALINE PHOSPHATASE: 85
ALT (SGPT): 27
AST (SGOT): 24
BUN/CREAT RATIO: 16.11
BUN: 29
CALCIUM: 9.1
CARBON DIOXIDE: 23
CHLORIDE: 102
CHOLESTEROL: 252
CREATININE: 1.8
ESTIMATED GLOMERULAR FILTRATION RATE: 37
FERRITIN: 96
GLUCOSE, FASTING: 239
HCT: 40.8
HDL-CHOLESTEROL: 40
HEMOGLOBIN A1C: 8.8
HGB: 13.7
IRON BINDING CAPACITY: 409
IRON SATURATION: 30
IRON: 122
LDL CHOLESTEROL,DIRECT: 155
PLATELET COUNT: 160
POTASSIUM: 4.4
SODIUM: 137
TOTAL PROTEIN: 6.2
TRIGLYCERIDES: 288
VITAMIN B12: >1500
VITAMIN B12: >1500
WBC: 6.4

## 2015-10-30 NOTE — Progress Notes (Addendum)
 [facility] Department of Medicine  [address]  [place], [place] [address]      PROGRESS NOTE    PATIENT NAME: [name], [name]  CHART NUMBER: [id number]  DATE OF BIRTH:   DATE OF SERVICE:  10/30/2015     IDENTIFICATION:  This is a 75 y.o. Caucasian male who is being followed in the [facility] for chronic kidney disease stage III with baseline creatinines ranging 1.7-1.9 for the past year, most likely multifactorial in etiology with uncontrolled diabetes, hypertension, atherosclerosis and heart failure.  I last saw him on May 01, 2015, and at that time his most recent creatinine level was 1.7.  He is here today in scheduled followup.    SUBJECTIVE:  The patient presents to clinic today accompanied by his sister.  He tells me he is doing very well and has no complaints or issues.  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   He denies chest pain or shortness of breath.  No lower extremity edema currently, he did in the past before and his pacemaker was placed.  Appetite is excellent.  No nausea or vomiting.  No fever, chills or hospitalizations.  He denies dysuria or hematuria.  He feels that urine output is adequate with fluid intake.  Home BPs range in the 120's/70s.    MEDICATIONS:   Current Outpatient Prescriptions   Medication Sig    amiodarone (PACERONE) 200 mg Oral Tablet Take 1 Tab (200 mg total) by mouth Once a day    ascorbic acid (VITAMIN C) 500 mg Oral Tablet Take 500 mg by mouth Twice daily     carvedilol (COREG) 6.25 mg Oral Tablet TAKE ONE TABLET BY MOUTH TWICE DAILY WITH FOOD    cholecalciferol, vitamin D3, 1,000 unit Oral Tablet Take 5,000 Units by mouth Once a day     Coenzyme Q10 (CO Q-10) 100 mg Oral Capsule Take by mouth Once a day     cyanocobalamin (VITAMIN B12) 1,000 mcg/mL Injection Solution 1,000 mcg by Subcutaneous route Every 30 days    digoxin (LANOXIN) 125 mcg Oral Tablet Take 0.125 mg by mouth Once a day    folic acid  (FOLVITE) 1 mg Oral Tablet Take 1 mg by mouth Once a day    furosemide (LASIX) 20 mg Oral Tablet Take 20 mg by mouth Once a day    glipiZIDE (GLUCOTROL) 5 mg Oral Tablet Take 5 mg by mouth Twice a day before meals Take 30 minutes before meals    Iron, Carbonyl 45 mg Oral Tablet Take by mouth Once a day    losartan (COZAAR) 50 mg Oral Tablet Take 25 mg by mouth Once a day     magnesium citrate 100 mg Oral Tablet Take by mouth Once a day     metFORMIN (GLUCOPHAGE) 500 mg Oral Tablet Take 500 mg by mouth Twice daily with food    multivitamin Oral Tablet Take 1 Tab by mouth Twice daily     nitroglycerin (NITROSTAT) 0.4 mg Sublingual Tablet, Sublingual 1 Tab (0.4 mg total) by Sublingual route Every 5 minutes as needed for Chest pain. for 3 doses over 15 minutes    Omega-3 Fatty Acids-Vitamin E (FISH OIL) 1,000 mg Oral Capsule Take 1,000 mg by mouth Once a day    ranitidine (ZANTAC) 150 mg Oral Tablet Take 150 mg by mouth Once a day     rivaroxaban (XARELTO) 15 mg Oral Tablet Take 1 Tab (15 mg  total) by mouth Every evening with dinner    spironolactone (ALDACTONE) 25 mg Oral Tablet Take 1 Tab (25 mg total) by mouth Once a day    VIT A/VIT C/VIT E/ZINC/COPPER (ICAPS AREDS ORAL) Take by mouth Twice daily         OBJECTIVE:  [name] is a 75 y.o.  Caucasian male who presents to the [facility] today accompanied by his sister.  He is ambulatory without difficulty and without the use of assistive devices.  He is in no acute distress, appears his stated age.  He is very pleasant and cooperative.      Vitals:    10/30/15 1033   BP: 126/72   Pulse: 74   Resp: 15   Weight: 99.8 kg (220 lb)   Height: 1.829 m (6')     HEENT:  Head is normocephalic and atraumatic.  Eyes are anicteric.  Mucous membranes moist.  Cardiac:  Normal S1, S2, regular rate and rhythm.  +Pacemaker.  Pulmonary:  Lungs are clear.  He displays good respiratory effort.  Abdomen:  Soft, nontender.  Skin:  Warm, dry and intact.   Extremities:  No lower extremity edema.  Psych:  Alert and oriented with normal mood and affect and appropriate response.  Neurologic:  No tremors or asterixis.    LABORATORY DATA:    Lab Results   Component Value Date    BUN 29 09/19/2015    BUN 25 06/19/2015    BUN 24 03/13/2015    CREATININE 1.8 09/19/2015    CREATININE 1.8 06/19/2015    CREATININE 1.7 03/13/2015    BUNCRRATIO 16.11 09/19/2015    BUNCRRATIO 13.89 06/19/2015    BUNCRRATIO 14.12 03/13/2015    GFR 37 09/19/2015    GFR 37 06/19/2015    GFR 40 03/13/2015    SODIUM 137 09/19/2015    SODIUM 137 06/19/2015    SODIUM 141 03/13/2015    POTASSIUM 4.4 09/19/2015    POTASSIUM 4.6 06/19/2015    POTASSIUM 4.3 03/13/2015    CHLORIDE 102 09/19/2015    CHLORIDE 105 06/19/2015    CHLORIDE 108 03/13/2015    CO2 23 09/19/2015    CO2 23 06/19/2015    CO2 23 03/13/2015    ANIONGAP 10 07/04/2014    ANIONGAP 9 01/10/2014    ANIONGAP 11 11/21/2013    CALCIUM 9.1 09/19/2015    CALCIUM 9.2 06/19/2015    CALCIUM 9.5 03/13/2015    PHOSPHORUS 4.2 11/28/2014    PHOSPHORUS 2.6 08/21/2014    PHOSPHORUS 4.1 05/19/2014    ALBUMIN 3.9 07/04/2014    HGB 13.7 09/19/2015    HGB 14.9 06/19/2015    HGB 13.7 03/13/2015    HCT 40.8 09/19/2015    HCT 43.3 06/19/2015    HCT 39.9 03/13/2015    INTACTPTH 81.4 11/28/2014    INTACTPTH 132.4 (H) 09/05/2014    INTACTPTH 120.8 (H) 07/04/2014    IRON 122 09/19/2015    IRON 124 06/19/2015    IRON 105 08/21/2014    IRONBINDCAP 409 09/19/2015    IRONBINDCAP 427 06/19/2015    IRONBINDCAP 411 03/13/2015    IRONSAT 30 09/19/2015    FERRITIN 96 09/19/2015    FERRITIN 77 06/19/2015    FERRITIN 41 03/13/2015    [id number] 49.0 07/04/2014    HA1C 8.8 09/19/2015    HA1C 9.5 06/19/2015    HA1C 7.3 03/13/2015         RADIOLOGY DATA:  Renal ultrasound from [facility] dated March 23, 2014, indicates right kidney at 9.3 cm with no evidence of hydronephrosis, renal calculi or renal cortical thinning.  Left kidney 12.4 cm and it demonstrates flow with  no evidence of hydronephrosis, renal calculi or renal cortical thinning.  There is a calculus in the posterior aspect of the bladder measuring 1.6 cm x 2 cm.    ASSESSMENT/PLAN:  1.  Chronic kidney disease stage III with baseline creatinines ranging 1.7-1.9 and most likely etiology multifactorial in etiology with uncontrolled diabetes, hypertension and atherosclerosis and heart failure.  His most recent creatinine level indicates stability at 1.8.  We will continue to monitor with quarterly labs and clinic visits.  I did remind him on the importance of complete avoidance of NSAIDs and IV contrast dyes if at all possible and he verbalized understanding.  2.  Lower extremity edema, much improved and minimal. Continue lasix 20 mg daily.    3.  Hypertension.  Blood pressure adequately controlled.  No changes in medications to be made at this time.  4.  Diabetes mellitus, inadequately controlled.  Last A1c above goal at 8.8, although this is down from previous reading of 8.9.  He will continue to follow with his primary care provider for management of diabetes with goal A1c less than 7%.  He is currently on metformin.  If his GFR were to decrease ot <30 this would need to be stopped due to risk of metabolic acidosis.  His most recent urine test was negative for protein.  He is on an ARB which he will continue.  5.  Disposition.  He will return to the [facility] in 3 months for reevaluation of above-mentioned conditions.  He will have lab work completed in the interim and will be seen sooner, if indicated.    The patient was seen independently.      Candie Chroman, ANP  Section of Nephrology  [facility] Department of Medicine

## 2016-01-01 ENCOUNTER — Ambulatory Visit (INDEPENDENT_AMBULATORY_CARE_PROVIDER_SITE_OTHER): Payer: Medicare Other | Admitting: Family

## 2016-01-01 ENCOUNTER — Encounter (INDEPENDENT_AMBULATORY_CARE_PROVIDER_SITE_OTHER): Payer: Self-pay | Admitting: Family

## 2016-01-01 VITALS — BP 130/78 | HR 78 | Resp 14 | Ht 72.0 in | Wt 218.0 lb

## 2016-01-01 DIAGNOSIS — E119 Type 2 diabetes mellitus without complications: Secondary | ICD-10-CM

## 2016-01-01 DIAGNOSIS — I129 Hypertensive chronic kidney disease with stage 1 through stage 4 chronic kidney disease, or unspecified chronic kidney disease: Secondary | ICD-10-CM

## 2016-01-01 DIAGNOSIS — N183 Chronic kidney disease, stage 3 (moderate): Secondary | ICD-10-CM

## 2016-01-01 DIAGNOSIS — I10 Essential (primary) hypertension: Secondary | ICD-10-CM

## 2016-01-01 DIAGNOSIS — E1165 Type 2 diabetes mellitus with hyperglycemia: Secondary | ICD-10-CM

## 2016-01-01 LAB — ENTER/EDIT NEPHROLOGY EXTERNAL LABS
ALBUMIN (SERUM): 3.8
ALKALINE PHOSPHATASE: 79
ALT (SGPT): 20
AST (SGOT): 24
BUN/CREAT RATIO: 13.16
BUN: 25
CALCIUM: 9.4
CARBON DIOXIDE: 20
CARBON DIOXIDE: 20
CHLORIDE: 108
CREATININE: 1.9
ESTIMATED GLOMERULAR FILTRATION RATE: 35
FERRITIN: 58
GLUCOSE,NONFAST: 136
HCT: 40.2
HEMOGLOBIN A1C: 9.2
HGB: 13.5
IRON BINDING CAPACITY: 384
IRON: 75
PLATELET COUNT: 142
POTASSIUM: 4
SODIUM: 140
TOTAL PROTEIN: 5.7
WBC: 7.1

## 2016-01-01 NOTE — Progress Notes (Signed)
 [facility] Department of Medicine  [address]  [place], [place] [address]      PROGRESS NOTE    PATIENT NAME: [name], [name]  CHART NUMBER: [id number]  DATE OF BIRTH:   DATE OF SERVICE:  01/01/2016     IDENTIFICATION:  This is a 75 y.o. Caucasian male who is being followed in the [facility] for chronic kidney disease stage III with baseline creatinines ranging 1.7-1.9 for the past year, most likely multifactorial in etiology with uncontrolled diabetes, hypertension, atherosclerosis and heart failure.  I last saw him on May 01, 2015, and at that time his most recent creatinine level was 1.7.  He is here today in scheduled followup.    SUBJECTIVE:  The patient presents to clinic today accompanied by his sister.  He tells me he continues to do very well and has no complaints or issues.  He denies chest pain or shortness of breath.  No lower extremity edema currently, he did in the past before and his pacemaker was placed.  Appetite is excellent.  Admits to "eating all the wrong stuff."  No nausea or vomiting.  No fever, chills or hospitalizations.  He denies dysuria or hematuria.  He feels that urine output is adequate with fluid intake.  Home BPs range in the 120's/70s.    MEDICATIONS:   Current Outpatient Prescriptions   Medication Sig    amiodarone (PACERONE) 200 mg Oral Tablet Take 1 Tab (200 mg total) by mouth Once a day    ascorbic acid (VITAMIN C) 500 mg Oral Tablet Take 500 mg by mouth Twice daily     carvedilol (COREG) 6.25 mg Oral Tablet TAKE ONE TABLET BY MOUTH TWICE DAILY WITH FOOD    cholecalciferol, vitamin D3, 1,000 unit Oral Tablet Take 5,000 Units by mouth Once a day     Coenzyme Q10 (CO Q-10) 100 mg Oral Capsule Take by mouth Once a day     cyanocobalamin (VITAMIN B12) 1,000 mcg/mL Injection Solution 1,000 mcg by Subcutaneous route Every 30 days    digoxin (LANOXIN) 125 mcg Oral Tablet Take 0.125 mg by mouth Once a day    folic acid (FOLVITE) 1 mg  Oral Tablet Take 1 mg by mouth Once a day    furosemide (LASIX) 20 mg Oral Tablet Take 20 mg by mouth Once a day    glipiZIDE (GLUCOTROL) 5 mg Oral Tablet Take 5 mg by mouth Twice a day before meals Take 30 minutes before meals    Iron, Carbonyl 45 mg Oral Tablet Take by mouth Once a day    losartan (COZAAR) 50 mg Oral Tablet Take 25 mg by mouth Once a day     magnesium citrate 100 mg Oral Tablet Take by mouth Once a day     metFORMIN (GLUCOPHAGE) 500 mg Oral Tablet Take 500 mg by mouth Twice daily with food    multivitamin Oral Tablet Take 1 Tab by mouth Twice daily     nitroglycerin (NITROSTAT) 0.4 mg Sublingual Tablet, Sublingual 1 Tab (0.4 mg total) by Sublingual route Every 5 minutes as needed for Chest pain. for 3 doses over 15 minutes    Omega-3 Fatty Acids-Vitamin E (FISH OIL) 1,000 mg Oral Capsule Take 1,000 mg by mouth Once a day    ranitidine (ZANTAC) 150 mg Oral Tablet Take 150 mg by mouth Once a day     rivaroxaban (XARELTO) 15 mg Oral Tablet Take 1 Tab (15 mg total) by mouth Every  evening with dinner    spironolactone (ALDACTONE) 25 mg Oral Tablet Take 1 Tab (25 mg total) by mouth Once a day    VIT A/VIT C/VIT E/ZINC/COPPER (ICAPS AREDS ORAL) Take by mouth Twice daily       OBJECTIVE:  [name] is a 75 y.o.  Caucasian male who presents to the [facility] today accompanied by his sister.  He is ambulatory without difficulty and without the use of assistive devices.  He is in no acute distress, appears his stated age.  He is very pleasant and cooperative.      Vitals:    01/01/16 0934   BP: 130/78   Pulse: 78   Resp: 14   Weight: 98.9 kg (218 lb)   Height: 1.829 m (6')     HEENT:  Head is normocephalic and atraumatic.  Eyes are anicteric.  Mucous membranes moist.  Cardiac:  Normal S1, S2, regular rate and rhythm.  +Pacemaker.  Pulmonary:  Lungs are clear.  He displays good respiratory effort.  Abdomen:  Soft, nontender.  Skin:  Warm, dry and intact.  Extremities:  No  lower extremity edema.  Psych:  Alert and oriented with normal mood and affect and appropriate response.  Neurologic:  No tremors or asterixis.    LABORATORY DATA:    Lab Results   Component Value Date    BUN 25 12/25/2015    BUN 29 09/19/2015    BUN 25 06/19/2015    CREATININE 1.9 12/25/2015    CREATININE 1.8 09/19/2015    CREATININE 1.8 06/19/2015    BUNCRRATIO 13.16 12/25/2015    BUNCRRATIO 16.11 09/19/2015    BUNCRRATIO 13.89 06/19/2015    GFR 35 12/25/2015    GFR 37 09/19/2015    GFR 37 06/19/2015    SODIUM 140 12/25/2015    SODIUM 137 09/19/2015    SODIUM 137 06/19/2015    POTASSIUM 4.0 12/25/2015    POTASSIUM 4.4 09/19/2015    POTASSIUM 4.6 06/19/2015    CHLORIDE 108 12/25/2015    CHLORIDE 102 09/19/2015    CHLORIDE 105 06/19/2015    CO2 20 12/25/2015    CO2 23 09/19/2015    CO2 23 06/19/2015    ANIONGAP 10 07/04/2014    ANIONGAP 9 01/10/2014    ANIONGAP 11 11/21/2013    CALCIUM 9.4 12/25/2015    CALCIUM 9.1 09/19/2015    CALCIUM 9.2 06/19/2015    PHOSPHORUS 4.2 11/28/2014    PHOSPHORUS 2.6 08/21/2014    PHOSPHORUS 4.1 05/19/2014    ALBUMIN 3.9 07/04/2014    HGB 13.5 12/25/2015    HGB 13.7 09/19/2015    HGB 14.9 06/19/2015    HCT 40.2 12/25/2015    HCT 40.8 09/19/2015    HCT 43.3 06/19/2015    INTACTPTH 81.4 11/28/2014    INTACTPTH 132.4 (H) 09/05/2014    INTACTPTH 120.8 (H) 07/04/2014    IRON 75 12/25/2015    IRON 122 09/19/2015    IRON 124 06/19/2015    IRONBINDCAP 384 12/25/2015    IRONBINDCAP 409 09/19/2015    IRONBINDCAP 427 06/19/2015    IRONSAT 30 09/19/2015    FERRITIN 58 12/25/2015    FERRITIN 96 09/19/2015    FERRITIN 77 06/19/2015    [id number] 49.0 07/04/2014    HA1C 9.2 12/25/2015    HA1C 8.8 09/19/2015    HA1C 9.5 06/19/2015         RADIOLOGY DATA:  Renal ultrasound from [facility] dated March 23, 2014, indicates  right kidney at 9.3 cm with no evidence of hydronephrosis, renal calculi or renal cortical thinning.  Left kidney 12.4 cm and it demonstrates flow with no evidence of  hydronephrosis, renal calculi or renal cortical thinning.  There is a calculus in the posterior aspect of the bladder measuring 1.6 cm x 2 cm.    ASSESSMENT/PLAN:  1.  Chronic kidney disease stage III with baseline creatinines ranging 1.7-1.9 and most likely etiology multifactorial in etiology with uncontrolled diabetes, hypertension and atherosclerosis and heart failure.  His most recent creatinine level indicates stability at 1.9.  We will continue to monitor with quarterly labs and clinic visits.  I did remind him on the importance of complete avoidance of NSAIDs and IV contrast dyes if at all possible and he verbalized understanding.  2.  Lower extremity edema, much improved and minimal. Continue lasix 20 mg daily.    3.  Hypertension.  Blood pressure adequately controlled.  No changes in medications to be made at this time.  4.  Diabetes mellitus, inadequately controlled.  Last A1c above goal at 9.2.  He will continue to follow with his primary care provider for management of diabetes with goal A1c less than 7%.   He is currently on metformin.  If his GFR were to decrease ot <30 this would need to be stopped due to risk of metabolic acidosis.  His most recent urine test was negative for protein.   Ordered to be checked again with next set of labs. He is on an ARB which he will continue.  5.  Disposition.  He will return to the [facility] in 3 months for reevaluation of above-mentioned conditions.  He will have lab work completed in the interim and will be seen sooner, if indicated.    The patient was seen independently.      Candie Chroman, ANP  Section of Nephrology  [facility] Department of Medicine

## 2016-01-09 ENCOUNTER — Ambulatory Visit: Payer: Medicare Other | Attending: Internal Medicine

## 2016-01-09 DIAGNOSIS — I509 Heart failure, unspecified: Secondary | ICD-10-CM | POA: Insufficient documentation

## 2016-01-09 DIAGNOSIS — I255 Ischemic cardiomyopathy: Secondary | ICD-10-CM

## 2016-01-09 DIAGNOSIS — I447 Left bundle-branch block, unspecified: Secondary | ICD-10-CM

## 2016-01-09 DIAGNOSIS — Z95810 Presence of automatic (implantable) cardiac defibrillator: Principal | ICD-10-CM | POA: Insufficient documentation

## 2016-01-09 DIAGNOSIS — Z4502 Encounter for adjustment and management of automatic implantable cardiac defibrillator: Secondary | ICD-10-CM | POA: Insufficient documentation

## 2016-01-09 NOTE — Procedures (Signed)
Patient's ICD was checked by remote transmission.    Patient's ICD was evaluated, typically including programmed   parameters, lead(s), battery, capture, sensing function(s), presence or   absence of therapies, and underlying heart rhythm. Often but not always   this includes sensor rate response, lower and upper heart rates, AV   intervals, pacing voltage and pulse duration, sensing value, and   diagnostics.  Please see programmer printout for details (scanned document).    SJM #[id number]  CRT-D.  Mode=DDD@70  bpm.  Battery status etr=4.6 years.  A: 440 ohms, P=>5 mV, threshold=0.625V@0 .21ms  [output=auto ].  RV: 380 ohms, R=7.1 mV, threshold=nt  [output=2.5V and 0.39ms ].  LV: 700 ohms, threshold=nt  [output=2.5V and 0.82ms ].  Ap=33% ,Vp=>99% .  VF detections=200 bpm. No recent vt/vf episodes.  No new alerts.  Normal device check.  Will recheck again in 6 months from home by remote transmission.  Modena Slater, RN  01/09/16      PACEMAKER PROGRAMMING EVALUATION    Order:  See patient's medical record.    Procedure Description:  Standard programming evaluation using pacemaker programmer.    Data recording:  See printouts, where available, for details.    Physician Analysis and Reporting:  Unremarkable device analysis.  No major abnormalities, except as otherwise noted, if applicable.    Plan:  Plan routine follow-up, as appropriate, or unless otherwise indicated.  Please see notes and/or scanned printouts (where available) for details.    Hazle Quant, MD  Cardiac Electrophysiology  [facility]

## 2016-01-16 ENCOUNTER — Other Ambulatory Visit (INDEPENDENT_AMBULATORY_CARE_PROVIDER_SITE_OTHER): Payer: Self-pay | Admitting: Internal Medicine

## 2016-01-16 MED ORDER — AMIODARONE 200 MG TABLET
200.0000 mg | ORAL_TABLET | Freq: Every day | ORAL | 3 refills | Status: DC
Start: 2016-01-16 — End: 2016-04-08

## 2016-01-17 DIAGNOSIS — I771 Stricture of artery: Secondary | ICD-10-CM

## 2016-01-17 DIAGNOSIS — I70203 Unspecified atherosclerosis of native arteries of extremities, bilateral legs: Secondary | ICD-10-CM

## 2016-02-14 ENCOUNTER — Inpatient Hospital Stay (HOSPITAL_COMMUNITY)
Admission: EM | Admit: 2016-02-14 | Discharge: 2016-02-17 | DRG: 193 | Disposition: A | Discharge status: Home / Self Care | Payer: Medicare Other | Attending: Internal Medicine | Admitting: Internal Medicine

## 2016-02-14 ENCOUNTER — Encounter (HOSPITAL_COMMUNITY): Payer: Self-pay | Admitting: Emergency Medicine

## 2016-02-14 ENCOUNTER — Emergency Department (HOSPITAL_COMMUNITY): Payer: Medicare Other

## 2016-02-14 DIAGNOSIS — I480 Paroxysmal atrial fibrillation: Secondary | ICD-10-CM | POA: Diagnosis present

## 2016-02-14 DIAGNOSIS — Z7984 Long term (current) use of oral hypoglycemic drugs: Secondary | ICD-10-CM | POA: Diagnosis not present

## 2016-02-14 DIAGNOSIS — Z79899 Other long term (current) drug therapy: Secondary | ICD-10-CM

## 2016-02-14 DIAGNOSIS — N182 Chronic kidney disease, stage 2 (mild): Secondary | ICD-10-CM | POA: Diagnosis present

## 2016-02-14 DIAGNOSIS — E1121 Type 2 diabetes mellitus with diabetic nephropathy: Secondary | ICD-10-CM | POA: Diagnosis present

## 2016-02-14 DIAGNOSIS — I429 Cardiomyopathy, unspecified: Secondary | ICD-10-CM | POA: Diagnosis present

## 2016-02-14 DIAGNOSIS — Z8249 Family history of ischemic heart disease and other diseases of the circulatory system: Secondary | ICD-10-CM | POA: Diagnosis not present

## 2016-02-14 DIAGNOSIS — R7989 Other specified abnormal findings of blood chemistry: Secondary | ICD-10-CM | POA: Diagnosis not present

## 2016-02-14 DIAGNOSIS — Z888 Allergy status to other drugs, medicaments and biological substances status: Secondary | ICD-10-CM

## 2016-02-14 DIAGNOSIS — E1140 Type 2 diabetes mellitus with diabetic neuropathy, unspecified: Secondary | ICD-10-CM | POA: Diagnosis present

## 2016-02-14 DIAGNOSIS — Z95810 Presence of automatic (implantable) cardiac defibrillator: Secondary | ICD-10-CM | POA: Diagnosis not present

## 2016-02-14 DIAGNOSIS — R778 Other specified abnormalities of plasma proteins: Secondary | ICD-10-CM

## 2016-02-14 DIAGNOSIS — J189 Pneumonia, unspecified organism: Secondary | ICD-10-CM | POA: Diagnosis present

## 2016-02-14 DIAGNOSIS — R0600 Dyspnea, unspecified: Secondary | ICD-10-CM | POA: Diagnosis present

## 2016-02-14 DIAGNOSIS — I5042 Chronic combined systolic (congestive) and diastolic (congestive) heart failure: Secondary | ICD-10-CM

## 2016-02-14 DIAGNOSIS — R748 Abnormal levels of other serum enzymes: Secondary | ICD-10-CM | POA: Diagnosis present

## 2016-02-14 DIAGNOSIS — E119 Type 2 diabetes mellitus without complications: Secondary | ICD-10-CM

## 2016-02-14 DIAGNOSIS — I482 Chronic atrial fibrillation, unspecified: Secondary | ICD-10-CM | POA: Diagnosis present

## 2016-02-14 DIAGNOSIS — I493 Ventricular premature depolarization: Secondary | ICD-10-CM | POA: Diagnosis present

## 2016-02-14 DIAGNOSIS — E1122 Type 2 diabetes mellitus with diabetic chronic kidney disease: Secondary | ICD-10-CM | POA: Diagnosis present

## 2016-02-14 DIAGNOSIS — I130 Hypertensive heart and chronic kidney disease with heart failure and stage 1 through stage 4 chronic kidney disease, or unspecified chronic kidney disease: Secondary | ICD-10-CM | POA: Diagnosis present

## 2016-02-14 DIAGNOSIS — Z7901 Long term (current) use of anticoagulants: Secondary | ICD-10-CM

## 2016-02-14 DIAGNOSIS — I2510 Atherosclerotic heart disease of native coronary artery without angina pectoris: Secondary | ICD-10-CM | POA: Diagnosis present

## 2016-02-14 DIAGNOSIS — I509 Heart failure, unspecified: Secondary | ICD-10-CM | POA: Diagnosis not present

## 2016-02-14 DIAGNOSIS — I5043 Acute on chronic combined systolic (congestive) and diastolic (congestive) heart failure: Secondary | ICD-10-CM | POA: Diagnosis present

## 2016-02-14 DIAGNOSIS — I252 Old myocardial infarction: Secondary | ICD-10-CM

## 2016-02-14 HISTORY — DX: Cardiomyopathy, unspecified: I42.9

## 2016-02-14 HISTORY — DX: Paroxysmal atrial fibrillation: I48.0

## 2016-02-14 LAB — CBC WITH DIFFERENTIAL/PLATELET
Basophils Absolute: 0 10*3/uL (ref 0.0–0.1)
Basophils Relative: 0 %
Eosinophils Absolute: 0.2 10*3/uL (ref 0.0–0.7)
Eosinophils Relative: 2 %
HEMATOCRIT: 42.6 % (ref 39.0–52.0)
HEMOGLOBIN: 14.3 g/dL (ref 13.0–17.0)
LYMPHS ABS: 1.7 10*3/uL (ref 0.7–4.0)
Lymphocytes Relative: 17 %
MCH: 30 pg (ref 26.0–34.0)
MCHC: 33.6 g/dL (ref 30.0–36.0)
MCV: 89.5 fL (ref 78.0–100.0)
Monocytes Absolute: 1.1 10*3/uL — ABNORMAL HIGH (ref 0.1–1.0)
Monocytes Relative: 12 %
NEUTROS PCT: 69 %
Neutro Abs: 6.6 10*3/uL (ref 1.7–7.7)
Platelets: 165 10*3/uL (ref 150–400)
RBC: 4.76 MIL/uL (ref 4.22–5.81)
RDW: 13.4 % (ref 11.5–15.5)
WBC: 9.6 10*3/uL (ref 4.0–10.5)

## 2016-02-14 LAB — BASIC METABOLIC PANEL
Anion gap: 11 (ref 5–15)
BUN: 27 mg/dL — ABNORMAL HIGH (ref 6–20)
CO2: 22 mmol/L (ref 22–32)
Calcium: 9.3 mg/dL (ref 8.9–10.3)
Chloride: 106 mmol/L (ref 101–111)
Creatinine, Ser: 1.55 mg/dL — ABNORMAL HIGH (ref 0.61–1.24)
GFR calc Af Amer: 49 mL/min — ABNORMAL LOW (ref >60)
GFR, EST NON AFRICAN AMERICAN: 42 mL/min — AB (ref >60)
Glucose, Bld: 138 mg/dL — ABNORMAL HIGH (ref 65–99)
POTASSIUM: 4.3 mmol/L (ref 3.5–5.1)
SODIUM: 139 mmol/L (ref 135–145)

## 2016-02-14 LAB — TROPONIN I: TROPONIN I: 0.2 ng/mL — AB (ref <0.03)

## 2016-02-14 LAB — I-STAT TROPONIN, ED: Troponin i, poc: 0.19 ng/mL (ref 0.00–0.08)

## 2016-02-14 LAB — BRAIN NATRIURETIC PEPTIDE: B Natriuretic Peptide: 497 pg/mL — ABNORMAL HIGH (ref 0.0–100.0)

## 2016-02-14 MED ORDER — BENZONATATE 100 MG PO CAPS: 100.0000 mg | ORAL_CAPSULE | Freq: Three times a day (TID) | ORAL | Status: DC | PRN

## 2016-02-14 MED ORDER — RIVAROXABAN 15 MG PO TABS
15.0000 mg | ORAL_TABLET | Freq: Every evening | ORAL | Status: DC
  Administered 2016-02-14 – 2016-02-16 (×3): 15 mg via ORAL
  Filled 2016-02-14 (×3): qty 1

## 2016-02-14 MED ORDER — DEXTROSE 5 % IV SOLN
500.0000 mg | Freq: Once | INTRAVENOUS | Status: AC
  Administered 2016-02-14: 500 mg via INTRAVENOUS
  Filled 2016-02-14: qty 500

## 2016-02-14 MED ORDER — COQ-10 100 MG PO CAPS: 100.0000 mg | ORAL_CAPSULE | Freq: Every evening | ORAL | Status: DC

## 2016-02-14 MED ORDER — DEXTROSE 5 % IV SOLN
1.0000 g | Freq: Once | INTRAVENOUS | Status: AC
  Administered 2016-02-14: 1 g via INTRAVENOUS
  Filled 2016-02-14: qty 10

## 2016-02-14 MED ORDER — CARVEDILOL 3.125 MG PO TABS
3.1250 mg | ORAL_TABLET | Freq: Two times a day (BID) | ORAL | Status: DC
  Administered 2016-02-14 – 2016-02-17 (×6): 3.125 mg via ORAL
  Filled 2016-02-14 (×6): qty 1

## 2016-02-14 MED ORDER — LEVALBUTEROL HCL 1.25 MG/0.5ML IN NEBU
1.2500 mg | INHALATION_SOLUTION | Freq: Four times a day (QID) | RESPIRATORY_TRACT | Status: DC | PRN
  Filled 2016-02-14: qty 0.5

## 2016-02-14 MED ORDER — PREDNISONE 20 MG PO TABS
40.0000 mg | ORAL_TABLET | Freq: Every day | ORAL | Status: DC
  Administered 2016-02-15: 40 mg via ORAL
  Filled 2016-02-14: qty 2

## 2016-02-14 MED ORDER — DIGOXIN 125 MCG PO TABS
0.1250 mg | ORAL_TABLET | Freq: Every day | ORAL | Status: DC
Start: 2016-02-15 — End: 2016-02-17
  Administered 2016-02-15 – 2016-02-17 (×3): 0.125 mg via ORAL
  Filled 2016-02-14 (×3): qty 1

## 2016-02-14 MED ORDER — INSULIN ASPART 100 UNIT/ML ~~LOC~~ SOLN
0.0000 [IU] | Freq: Three times a day (TID) | SUBCUTANEOUS | Status: DC
  Administered 2016-02-15: 2 [IU] via SUBCUTANEOUS
  Administered 2016-02-15: 1 [IU] via SUBCUTANEOUS
  Administered 2016-02-15: 9 [IU] via SUBCUTANEOUS
  Administered 2016-02-16: 2 [IU] via SUBCUTANEOUS
  Administered 2016-02-16 (×2): 3 [IU] via SUBCUTANEOUS
  Administered 2016-02-17: 1 [IU] via SUBCUTANEOUS

## 2016-02-14 MED ORDER — SPIRONOLACTONE 25 MG PO TABS
12.5000 mg | ORAL_TABLET | Freq: Every day | ORAL | Status: DC
  Administered 2016-02-15 – 2016-02-17 (×3): 12.5 mg via ORAL
  Filled 2016-02-14 (×3): qty 1

## 2016-02-14 MED ORDER — FERROUS SULFATE 325 (65 FE) MG PO TABS
325.0000 mg | ORAL_TABLET | Freq: Every day | ORAL | Status: DC
  Administered 2016-02-15 – 2016-02-17 (×3): 325 mg via ORAL
  Filled 2016-02-14 (×3): qty 1

## 2016-02-14 MED ORDER — MAGNESIUM OXIDE 400 (241.3 MG) MG PO TABS
200.0000 mg | ORAL_TABLET | Freq: Every day | ORAL | Status: DC
  Administered 2016-02-15 – 2016-02-17 (×3): 200 mg via ORAL
  Filled 2016-02-14 (×3): qty 1

## 2016-02-14 MED ORDER — NITROGLYCERIN 0.4 MG SL SUBL: 0.4000 mg | SUBLINGUAL_TABLET | SUBLINGUAL | Status: DC | PRN

## 2016-02-14 MED ORDER — DEXTROSE 5 % IV SOLN
1.0000 g | INTRAVENOUS | Status: DC
  Administered 2016-02-15 – 2016-02-16 (×2): 1 g via INTRAVENOUS
  Filled 2016-02-14 (×3): qty 10

## 2016-02-14 MED ORDER — VITAMIN D 1000 UNITS PO TABS
5000.0000 [IU] | ORAL_TABLET | Freq: Every day | ORAL | Status: DC
  Administered 2016-02-15 – 2016-02-17 (×3): 5000 [IU] via ORAL
  Filled 2016-02-14 (×3): qty 5

## 2016-02-14 MED ORDER — AMIODARONE HCL 200 MG PO TABS
200.0000 mg | ORAL_TABLET | Freq: Every day | ORAL | Status: DC
  Administered 2016-02-15 – 2016-02-17 (×3): 200 mg via ORAL
  Filled 2016-02-14 (×3): qty 1

## 2016-02-14 MED ORDER — ALBUTEROL SULFATE (2.5 MG/3ML) 0.083% IN NEBU
INHALATION_SOLUTION | RESPIRATORY_TRACT | Status: AC
  Filled 2016-02-14: qty 3

## 2016-02-14 MED ORDER — FUROSEMIDE 20 MG PO TABS
20.0000 mg | ORAL_TABLET | Freq: Every day | ORAL | Status: DC
Start: 2016-02-15 — End: 2016-02-17
  Administered 2016-02-15 – 2016-02-17 (×3): 20 mg via ORAL
  Filled 2016-02-14 (×3): qty 1

## 2016-02-14 MED ORDER — ALBUTEROL SULFATE (2.5 MG/3ML) 0.083% IN NEBU
5.0000 mg | INHALATION_SOLUTION | Freq: Once | RESPIRATORY_TRACT | Status: AC
  Administered 2016-02-14: 5 mg via RESPIRATORY_TRACT
  Filled 2016-02-14: qty 6

## 2016-02-14 MED ORDER — DEXTROSE 5 % IV SOLN
500.0000 mg | INTRAVENOUS | Status: DC
  Administered 2016-02-15: 500 mg via INTRAVENOUS
  Filled 2016-02-14: qty 500

## 2016-02-14 MED ORDER — FOLIC ACID 1 MG PO TABS
1.0000 mg | ORAL_TABLET | Freq: Every day | ORAL | Status: DC
  Administered 2016-02-15 – 2016-02-17 (×3): 1 mg via ORAL
  Filled 2016-02-14 (×3): qty 1

## 2016-02-14 MED ORDER — FAMOTIDINE 20 MG PO TABS
20.0000 mg | ORAL_TABLET | Freq: Every day | ORAL | Status: DC
  Administered 2016-02-15 – 2016-02-17 (×3): 20 mg via ORAL
  Filled 2016-02-14 (×3): qty 1

## 2016-02-14 MED ORDER — MAGNESIUM 100 MG PO TABS: 100.0000 mg | ORAL_TABLET | Freq: Every day | ORAL | Status: DC

## 2016-02-14 NOTE — Progress Notes (Signed)
CRITICAL VALUE ALERT  Critical value received:  Troponin 0.20  Date of notification:  02/14/16   Time of notification:  1927  Critical value read back:Yes.    Nurse who received alert:  Elisabeth Pigeon   MD notified (1st page):  [place]  Time of first page:  1928  MD notified (2nd page):  Time of second page:  Responding MD:  Joneen Roach  Time MD responded:  [phone]

## 2016-02-14 NOTE — Progress Notes (Addendum)
PHARMACIST - PHYSICIAN ORDER COMMUNICATION  CONCERNING: P&T Medication Policy on Herbal Medications  DESCRIPTION:  This patient's order for:  Co Q 10, Magnesium 100mg  (OTC strength)  has been noted.  This product(s) is classified as an "herbal" or natural product. Due to a lack of definitive safety studies or FDA approval, nonstandard manufacturing practices, plus the potential risk of unknown drug-drug interactions while on inpatient medications, the Pharmacy and Therapeutics Committee does not permit the use of "herbal" or natural products of this type within [facility].   ACTION TAKEN: The pharmacy department is unable to verify this order at this time and your patient has been informed of this safety policy. Please reevaluate patient's clinical condition at discharge and address if the herbal or natural product(s) should be resumed at that time.

## 2016-02-14 NOTE — H&P (Signed)
History and Physical    Darius Saunders MOQ:[id number] DOB: 1941-07-01 DOA: 02/14/2016  Referring MD/NP/PA: Dr. Fredderick Phenix   PCP: PROVIDER NOT IN SYSTEM   Patient coming from: Home   Chief Complaint: Dyspnea   HPI:  &75 yo male with known history of atrial fib on Xarelto, DM, combined systolic and diastolic CHF per last ECHO in 3546 with EF 20 - 25% and grade I diastolic CHF presents to [facility] ED with main concern of several days duration of progressively worsening dyspnea, present at rest occasionally but worse with exertion, associated with subjective fevers, chills, mixed non productive and productive cough or clear and yellow sputum, chest pressure that is worse with lying down flat and with coughing spells. Pt denies similar events in the past. No recent sick contact or exposures, no abd or urinary concerns, no known changes in weight or appetite. Chest pain is not radiating but is present across the anterior chest area and is sharp, 5/10 in severity when present and relieved once coughing spells subsides.   ED Course: In ED, initially pt with wheezing and dyspnea at rest, given duonebs and responded. VSS, blood work stable except with Cr 1.55. Imaging studies notable for PNA and TRH asked to admit for further evaluation.   Review of Systems:  Constitutional: appetite change and fatigue.  HENT: Negative for  nosebleeds, congestion, facial swelling, rhinorrhea, Eyes: Negative for pain, discharge, redness, itching and visual disturbance.  Respiratory: Negative for stridor.   Cardiovascular: Negative for palpitations and leg swelling.  Gastrointestinal: Negative for abdominal distention.  Genitourinary: Negative for dysuria, urgency, frequency, hematuria, flank pain, Musculoskeletal: Negative for back pain, joint swelling, arthralgias and gait problem.  Neurological: Negative for dizziness, tremors, seizures, syncope, facial asymmetry, speech difficulty, weakness, light-headedness, numbness and  headaches.  Hematological: Negative for adenopathy. Does not bruise/bleed easily.  Psychiatric/Behavioral: Negative for hallucinations, behavioral problems, confusion, dysphoric mood, decreased concentration and agitation.   Past Medical History:  Diagnosis Date  . Atrial fibrillation (HCC)   . Diabetes mellitus without complication (HCC)   . Diabetic neuropathy (HCC)   . Hypertension   . MI (myocardial infarction) [facility])     Past Surgical History:  Procedure Laterality Date  . APPENDECTOMY    . TONSILLECTOMY       reports that he has never smoked. He has never used smokeless tobacco. He reports that he drinks alcohol. He reports that he does not use drugs.  Allergies  Allergen Reactions  . Other     Steroids- caused him to stay awake  . Statins Other (See Comments)    Rash, memory loss    Family History  Problem Relation Age of Onset  . Hypertension Sister     Medication Sig  amiodarone (PACERONE) 200 MG tablet Take 200 mg by mouth daily.  carvedilol (COREG) 6.25 MG tablet Take 3.125 mg by mouth 2 (two) times daily with a meal.  digoxin (LANOXIN) 0.125 MG tablet Take 1 tablet (0.125 mg total) by mouth daily.  furosemide (LASIX) 20 MG tablet Take 20 mg by mouth daily.  glipiZIDE (GLUCOTROL) 10 MG tablet Take 10 mg by mouth 2 (two) times daily.  Iron, Ferrous Sulfate, 142 (45 Fe) MG TBCR Take 142 mg by mouth daily.  Magnesium 100 MG TABS Take 100 mg by mouth daily.  metFORMIN (GLUCOPHAGE) 500 MG tablet Take 500 mg by mouth 2 (two) times daily with a meal.   metFORMIN (GLUCOPHAGE-XR) 500 MG 24 hr tablet Take 500 mg by mouth 2 (  two) times daily.  Rivaroxaban (XARELTO) 15 MG TABS tablet Take 15 mg by mouth every evening.  spironolactone (ALDACTONE) 25 MG tablet Take 12.5 mg by mouth daily.    Physical Exam: Vitals:   02/14/16 1327 02/14/16 1549 02/14/16 1619  BP: 150/99 140/85 140/85  Pulse: 96 97 86  Resp: 16  23  Temp: 97.8 F (36.6 C)    TempSrc: Oral    SpO2:  97% 98% 98%    Constitutional: NAD, calm, comfortable Vitals:   02/14/16 1327 02/14/16 1549 02/14/16 1619  BP: 150/99 140/85 140/85  Pulse: 96 97 86  Resp: 16  23  Temp: 97.8 F (36.6 C)    TempSrc: Oral    SpO2: 97% 98% 98%   Eyes: PERRL, lids and conjunctivae normal ENMT: Mucous membranes are moist. Posterior pharynx clear of any exudate or lesions.Normal dentition.  Neck: normal, supple, no masses, no thyromegaly Respiratory: rhonchi at left base and some at the right base  Cardiovascular: Irregular rate and rhythm, no  gallops. 2+ pedal pulses. No carotid bruits.  Abdomen: no tenderness, no masses palpated. No hepatosplenomegaly. Bowel sounds positive.  Musculoskeletal: no clubbing / cyanosis. No joint deformity upper and lower extremities. Good ROM, no contractures. Normal muscle tone.  Skin: no rashes, lesions, ulcers. No induration Neurologic: CN 2-12 grossly intact. Sensation intact, DTR normal. Strength 5/5 in all 4.  Psychiatric: Normal judgment and insight. Alert and oriented x 3. Normal mood.   Labs on Admission: I have personally reviewed following labs and imaging studies  CBC:  Recent Labs Lab 02/14/16 1511  WBC 9.6  NEUTROABS 6.6  HGB 14.3  HCT 42.6  MCV 89.5  PLT 165   Basic Metabolic Panel:  Recent Labs Lab 02/14/16 1530  NA 139  K 4.3  CL 106  CO2 22  GLUCOSE 138*  BUN 27*  CREATININE 1.55*  CALCIUM 9.3   Urine analysis:    Component Value Date/Time   COLORURINE YELLOW 07/08/2013 1820   APPEARANCEUR CLEAR 07/08/2013 1820   LABSPEC 1.023 07/08/2013 1820   PHURINE 5.5 07/08/2013 1820   GLUCOSEU NEGATIVE 07/08/2013 1820   HGBUR NEGATIVE 07/08/2013 1820   BILIRUBINUR NEGATIVE 07/08/2013 1820   KETONESUR NEGATIVE 07/08/2013 1820   PROTEINUR NEGATIVE 07/08/2013 1820   UROBILINOGEN 0.2 07/08/2013 1820   NITRITE NEGATIVE 07/08/2013 1820   LEUKOCYTESUR NEGATIVE 07/08/2013 1820    Radiological Exams on Admission: Dg Chest 2  View  Result Date: 02/14/2016 CLINICAL DATA:  Chest pain and shortness of breath.  Cough. EXAM: CHEST  2 VIEW COMPARISON:  07/10/2013 FINDINGS: Chronic cardiomegaly. 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 Stable aortic and hilar contours. Hazy basilar opacities asymmetric to the left. No cephalized blood flow. Trace effusions. IMPRESSION: Bibasilar opacity with asymmetric dense appearance on the left primarily concerning for pneumonia. Trace pleural effusions. Electronically Signed   By: Marnee Spring M.D.   On: 02/14/2016 14:48    EKG: pending   Assessment/Plan Active Problems: LLL PNA, CAP, unknown pathogen - order set utilized - start with providing supportive care with ABX Zithromax and Rocephin - blood and sputum cx, urine legionella and strep pneumo - duonebs as needed  - due to wheezing, will place on short prednisone course   Atrial fib  - rate controlled, CHADS score 3 - continue Xarelto, amiodarone, digoxin   DM type II with complications of nephropathy, CKDS stage II  - hold metformin and place on SSI - check A1C  CKD stage 2 -  baseline Cr in 2014 as high as 1.6, GFR in 50's  - monitor for now  Essential HTN - continue home medical regimen    Combined chronic systolic and diastolic CHF, chest pain, elevated troponins  - last ECHO in 2014 and per pt he moved to [place] and is not sure if had repeat ECHO - no signs of volume overload - will continue Lasix  - ECHO will be repeated - suspect chest pain is related to PNA but will cycle troponins for 24 hours as first set already elevated - review of records indicate last admission with similar problem pt also with similar elevation in troponin - monitor on tele - consider cardio consult in AM   DVT prophylaxis: Xarelto  Code Status: Full  Family Communication: Pt updated at bedside Disposition Plan: Home in 1-2 days  Consults called: None Admission status: Inpatient   Debbora Presto MD [facility] Pager [phone]  If 7PM-7AM, please contact night-coverage [email or web address] Password [facility]  02/14/2016, 4:27 PM

## 2016-02-14 NOTE — Progress Notes (Signed)
Pharmacy Antibiotic Follow-up Note  Darius Saunders is a 75 y.o. year-old male admitted on 02/14/2016.  The patient is currently on day 1 of Rocephin & Azithromycin  for CAP.  Assessment/Plan: This patient's current antibiotics will be continued without adjustments.  These abx need no adjustment for renal fx, will sign off protocol  Temp (24hrs), Avg:97.8 F (36.6 C), Min:97.7 F (36.5 C), Max:97.8 F (36.6 C)   Recent Labs Lab 02/14/16 1511  WBC 9.6    Recent Labs Lab 02/14/16 1530  CREATININE 1.55*   CrCl cannot be calculated (Unknown ideal weight.).    Allergies  Allergen Reactions  . Other     Steroids- caused him to stay awake  . Statins Other (See Comments)    Rash, memory loss   Antimicrobials this admission: 8/3 Azithromycin >>  8/3 Rocephin >>   Microbiology results: None ordered  Thank you for allowing pharmacy to be a part of this patient's care.  Otho Bellows PharmD 02/14/2016 5:50 PM

## 2016-02-14 NOTE — ED Notes (Signed)
RN starting IV will collect labs 

## 2016-02-14 NOTE — ED Triage Notes (Signed)
Patient presents for SOB with exertion, wheezing, hypertension, non productive cough x1 day. Denies fever, swelling, N/V.

## 2016-02-14 NOTE — ED Notes (Signed)
MD made aware of I STAT troponin results

## 2016-02-14 NOTE — ED Provider Notes (Signed)
[facility]-EMERGENCY DEPT Provider Note   CSN: [id number] Arrival date & time: 02/14/16  1317  First Provider Contact:  First MD Initiated Contact with Patient 02/14/16 1411        History   Chief Complaint Chief Complaint  Patient presents with  . Wheezing  . Cough    HPI Darius Saunders is a 75 y.o. male.  Patient presents with cough and wheezing. He has a 3 to four-day history of worsening nonproductive cough and wheezing. He has tightness across his chest which is worse when he lays flat. When he starts to lay down at night, his cough gets worse, his wheezing gets worse and he develops tightness across his chest. The tightness resolved when he sits up. He denies any known fevers. No leg swelling. No nausea or vomiting. He has some mild shortness of breath. No history of COPD asthma or other lung problems. He does have a history of diabetes, A. fib, hypertension and CHF.    Wheezing  Cough   Pertinent negatives include no numbness.    Past Medical History:  Diagnosis Date  . Atrial fibrillation (HCC)   . Diabetes mellitus without complication (HCC)   . Diabetic neuropathy (HCC)   . Hypertension   . MI (myocardial infarction) [facility])     Patient Active Problem List   Diagnosis Date Noted  . Acute systolic congestive heart failure, NYHA class 3 (HCC) 07/10/2013  . Ischemic cardiomyopathy 07/10/2013  . Acute respiratory failure with hypoxia (HCC) 07/09/2013  . CAD (coronary artery disease) 07/09/2013  . CAP (community acquired pneumonia) 07/08/2013  . Atrial fibrillation with RVR (HCC) 07/08/2013  . DM2 (diabetes mellitus, type 2) (HCC) 07/08/2013    Past Surgical History:  Procedure Laterality Date  . APPENDECTOMY    . TONSILLECTOMY         Home Medications    Prior to Admission medications   Medication Sig Start Date End Date Taking? Authorizing Provider  amiodarone (PACERONE) 200 MG tablet Take 200 mg by mouth daily.   Yes Historical Provider, MD    benzonatate (TESSALON) 100 MG capsule Take 100 mg by mouth 3 (three) times daily as needed for cough.   Yes Historical Provider, MD  carvedilol (COREG) 6.25 MG tablet Take 3.125 mg by mouth 2 (two) times daily with a meal.   Yes Historical Provider, MD  Cholecalciferol (VITAMIN D3) 5000 units CAPS Take 5,000 Units by mouth daily.   Yes Historical Provider, MD  Coenzyme Q10 (COQ-10) 100 MG CAPS Take 100 mg by mouth every evening.   Yes Historical Provider, MD  Cyanocobalamin (VITAMIN B-12 IJ) Inject 1,000 mg as directed every 30 (thirty) days.    Yes Historical Provider, MD  digoxin (LANOXIN) 0.125 MG tablet Take 1 tablet (0.125 mg total) by mouth daily. 07/13/13  Yes Alison Murray, MD  folic acid (FOLVITE) 1 MG tablet Take 1 mg by mouth daily.   Yes Historical Provider, MD  furosemide (LASIX) 20 MG tablet Take 20 mg by mouth daily.   Yes Historical Provider, MD  glipiZIDE (GLUCOTROL) 10 MG tablet Take 10 mg by mouth 2 (two) times daily.   Yes Historical Provider, MD  Iron, Ferrous Sulfate, 142 (45 Fe) MG TBCR Take 142 mg by mouth daily.   Yes Historical Provider, MD  Magnesium 100 MG TABS Take 100 mg by mouth daily.   Yes Historical Provider, MD  metFORMIN (GLUCOPHAGE) 500 MG tablet Take 500 mg by mouth 2 (two) times daily with a meal.  Yes Historical Provider, MD  metFORMIN (GLUCOPHAGE-XR) 500 MG 24 hr tablet Take 500 mg by mouth 2 (two) times daily.   Yes Historical Provider, MD  Multiple Vitamin (MULTIVITAMIN WITH MINERALS) TABS tablet Take 1 tablet by mouth 2 (two) times daily.    Yes Historical Provider, MD  Multiple Vitamins-Minerals (ICAPS AREDS 2) CAPS Take 1 tablet by mouth 2 (two) times daily.   Yes Historical Provider, MD  nitroGLYCERIN (NITROSTAT) 0.4 MG SL tablet Place 0.4 mg under the tongue every 5 (five) minutes as needed for chest pain.  08/30/11  Yes Historical Provider, MD  Omega-3 Fatty Acids (FISH OIL) 1000 MG CAPS Take 1,000 mg by mouth every morning.   Yes Historical  Provider, MD  ranitidine (ZANTAC) 150 MG tablet Take 150 mg by mouth daily.   Yes Historical Provider, MD  Rivaroxaban (XARELTO) 15 MG TABS tablet Take 15 mg by mouth every evening.   Yes Historical Provider, MD  spironolactone (ALDACTONE) 25 MG tablet Take 12.5 mg by mouth daily.   Yes Historical Provider, MD  vitamin C (ASCORBIC ACID) 500 MG tablet Take 500 mg by mouth 2 (two) times daily.   Yes Historical Provider, MD    Family History Family History  Problem Relation Age of Onset  . Hypertension Sister     Social History Social History  Substance Use Topics  . Smoking status: Never Smoker  . Smokeless tobacco: Never Used  . Alcohol use Yes     Comment: Socially     Allergies   Other and Statins   Review of Systems Review of Systems  Constitutional: Positive for fatigue. Negative for chills, diaphoresis and fever.  HENT: Negative for congestion, rhinorrhea and sneezing.   Eyes: Negative.   Respiratory: Positive for cough, chest tightness, shortness of breath and wheezing.   Cardiovascular: Negative for chest pain and leg swelling.  Gastrointestinal: Negative for abdominal pain, blood in stool, diarrhea, nausea and vomiting.  Genitourinary: Negative for difficulty urinating, flank pain, frequency and hematuria.  Musculoskeletal: Negative for arthralgias and back pain.  Skin: Negative for rash.  Neurological: Negative for dizziness, speech difficulty, weakness, numbness and headaches.     Physical Exam Updated Vital Signs BP 140/85 (BP Location: Right Arm)   Pulse 86   Temp 97.8 F (36.6 C) (Oral)   Resp 23   SpO2 98%   Physical Exam  Constitutional: He is oriented to person, place, and time. He appears well-developed and well-nourished.  HENT:  Head: Normocephalic and atraumatic.  Eyes: Pupils are equal, round, and reactive to light.  Neck: Normal range of motion. Neck supple.  Cardiovascular: Normal rate, regular rhythm and normal heart sounds.     Pulmonary/Chest: Effort normal. No respiratory distress. He has wheezes. He has no rales. He exhibits no tenderness.  Rhonchi bilaterally, few crackles in the bases  Abdominal: Soft. Bowel sounds are normal. There is no tenderness. There is no rebound and no guarding.  Musculoskeletal: Normal range of motion. He exhibits no edema.  Lymphadenopathy:    He has no cervical adenopathy.  Neurological: He is alert and oriented to person, place, and time.  Skin: Skin is warm and dry. No rash noted.  Psychiatric: He has a normal mood and affect.     ED Treatments / Results  Labs (all labs ordered are listed, but only abnormal results are displayed) Labs Reviewed  CBC WITH DIFFERENTIAL/PLATELET - Abnormal; Notable for the following:       Result Value   Monocytes Absolute  1.1 (*)    All other components within normal limits  BRAIN NATRIURETIC PEPTIDE - Abnormal; Notable for the following:    B Natriuretic Peptide 497.0 (*)    All other components within normal limits  BASIC METABOLIC PANEL - Abnormal; Notable for the following:    Glucose, Bld 138 (*)    BUN 27 (*)    Creatinine, Ser 1.55 (*)    GFR calc non Af Amer 42 (*)    GFR calc Af Amer 49 (*)    All other components within normal limits  I-STAT TROPOININ, ED - Abnormal; Notable for the following:    Troponin i, poc 0.19 (*)    All other components within normal limits    EKG  EKG Interpretation  Date/Time:  Thursday February 14 2016 13:52:10 EDT Ventricular Rate:  92 PR Interval:    QRS Duration: 140 QT Interval:  405 QTC Calculation: 502 R Axis:   -112 Text Interpretation:  Atrial-sensed ventricular-paced complexes No further analysis attempted due to paced rhythm Baseline wander in lead(s) V2 atrial fibrillation resolved since last tracing Confirmed by KNAPP  MD-J, JON ([id number]) on 02/14/2016 2:03:05 PM       Radiology Dg Chest 2 View  Result Date: 02/14/2016 CLINICAL DATA:  Chest pain and shortness of breath.  Cough.  EXAM: CHEST  2 VIEW COMPARISON:  07/10/2013 FINDINGS: Chronic cardiomegaly. 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 Stable aortic and hilar contours. Hazy basilar opacities asymmetric to the left. No cephalized blood flow. Trace effusions. IMPRESSION: Bibasilar opacity with asymmetric dense appearance on the left primarily concerning for pneumonia. Trace pleural effusions. Electronically Signed   By: Marnee Spring M.D.   On: 02/14/2016 14:48    Procedures Procedures (including critical care time)  Medications Ordered in ED Medications  azithromycin (ZITHROMAX) 500 mg in dextrose 5 % 250 mL IVPB (500 mg Intravenous New Bag/Given 02/14/16 1618)  albuterol (PROVENTIL) (2.5 MG/3ML) 0.083% nebulizer solution 5 mg ( Nebulization Not Given 02/14/16 1538)  cefTRIAXone (ROCEPHIN) 1 g in dextrose 5 % 50 mL IVPB (0 g Intravenous Stopped 02/14/16 1618)     Initial Impression / Assessment and Plan / ED Course  I have reviewed the triage vital signs and the nursing notes.  Pertinent labs & imaging results that were available during my care of the patient were reviewed by me and considered in my medical decision making (see chart for details).  Clinical Course    Patient has evidence of pneumonia on x-ray. He was given antibiotics for community-acquired pneumonia. He has no suggestions of sepsis. He also has a mild elevation in his troponin. He currently denies chest pain. His EKG shows a paced rhythm, but no underlying ischemic changes. His creatinine is elevated but not too different than his prior values from his last hospitalization in 2014. I will consult the hospitalist for admission. He is from [place] and is here visiting his daughter.    16:28 I spoke with Dr. Izola Price who will admit pt.  She requests a cardiology consult as well.  17:40 I spoke with Dr. Jens Som with cardiology who advised to have hospitalist team page cardiology in the am for consult.  Sent message to Dr.  Izola Price Final Clinical Impressions(s) / ED Diagnoses   Final diagnoses:  Community acquired pneumonia  Elevated troponin    New Prescriptions New Prescriptions   No medications on file     Rolan Bucco, MD 02/14/16 [phone]

## 2016-02-15 ENCOUNTER — Inpatient Hospital Stay (HOSPITAL_COMMUNITY): Payer: Medicare Other

## 2016-02-15 ENCOUNTER — Encounter (HOSPITAL_COMMUNITY): Payer: Self-pay | Admitting: Cardiology

## 2016-02-15 DIAGNOSIS — R7989 Other specified abnormal findings of blood chemistry: Secondary | ICD-10-CM

## 2016-02-15 DIAGNOSIS — E119 Type 2 diabetes mellitus without complications: Secondary | ICD-10-CM

## 2016-02-15 DIAGNOSIS — I482 Chronic atrial fibrillation: Secondary | ICD-10-CM

## 2016-02-15 DIAGNOSIS — I5042 Chronic combined systolic (congestive) and diastolic (congestive) heart failure: Secondary | ICD-10-CM

## 2016-02-15 DIAGNOSIS — N179 Acute kidney failure, unspecified: Secondary | ICD-10-CM | POA: Insufficient documentation

## 2016-02-15 DIAGNOSIS — J189 Pneumonia, unspecified organism: Principal | ICD-10-CM

## 2016-02-15 DIAGNOSIS — I509 Heart failure, unspecified: Secondary | ICD-10-CM

## 2016-02-15 LAB — CBC
HCT: 36.2 % — ABNORMAL LOW (ref 39.0–52.0)
HEMOGLOBIN: 12.1 g/dL — AB (ref 13.0–17.0)
MCH: 29.7 pg (ref 26.0–34.0)
MCHC: 33.4 g/dL (ref 30.0–36.0)
MCV: 88.9 fL (ref 78.0–100.0)
Platelets: 157 10*3/uL (ref 150–400)
RBC: 4.07 MIL/uL — ABNORMAL LOW (ref 4.22–5.81)
RDW: 13.5 % (ref 11.5–15.5)
WBC: 6.8 10*3/uL (ref 4.0–10.5)

## 2016-02-15 LAB — STREP PNEUMONIAE URINARY ANTIGEN: STREP PNEUMO URINARY ANTIGEN: NEGATIVE

## 2016-02-15 LAB — BASIC METABOLIC PANEL
ANION GAP: 9 (ref 5–15)
BUN: 27 mg/dL — AB (ref 6–20)
CALCIUM: 8.9 mg/dL (ref 8.9–10.3)
CO2: 21 mmol/L — ABNORMAL LOW (ref 22–32)
Chloride: 108 mmol/L (ref 101–111)
Creatinine, Ser: 1.64 mg/dL — ABNORMAL HIGH (ref 0.61–1.24)
GFR calc Af Amer: 46 mL/min — ABNORMAL LOW (ref >60)
GFR, EST NON AFRICAN AMERICAN: 40 mL/min — AB (ref >60)
GLUCOSE: 132 mg/dL — AB (ref 65–99)
Potassium: 4.2 mmol/L (ref 3.5–5.1)
SODIUM: 138 mmol/L (ref 135–145)

## 2016-02-15 LAB — GLUCOSE, CAPILLARY
GLUCOSE-CAPILLARY: 198 mg/dL — AB (ref 65–99)
GLUCOSE-CAPILLARY: 363 mg/dL — AB (ref 65–99)
Glucose-Capillary: 165 mg/dL — ABNORMAL HIGH (ref 65–99)
Glucose-Capillary: 144 mg/dL — ABNORMAL HIGH (ref 65–99)
Glucose-Capillary: 286 mg/dL — ABNORMAL HIGH (ref 65–99)

## 2016-02-15 LAB — ECHOCARDIOGRAM COMPLETE
Height: 72 in
Weight: 3366.4 oz

## 2016-02-15 LAB — TROPONIN I
TROPONIN I: 0.21 ng/mL — AB (ref <0.03)
TROPONIN I: 0.21 ng/mL — AB (ref <0.03)

## 2016-02-15 MED ORDER — PERFLUTREN LIPID MICROSPHERE
INTRAVENOUS | Status: AC
  Filled 2016-02-15: qty 10

## 2016-02-15 MED ORDER — PERFLUTREN LIPID MICROSPHERE
1.0000 mL | INTRAVENOUS | Status: AC | PRN
  Administered 2016-02-15: 2 mL via INTRAVENOUS
  Filled 2016-02-15: qty 10

## 2016-02-15 MED ORDER — OMEGA-3-ACID ETHYL ESTERS 1 G PO CAPS
1.0000 g | ORAL_CAPSULE | Freq: Two times a day (BID) | ORAL | Status: DC
  Administered 2016-02-15 – 2016-02-17 (×5): 1 g via ORAL
  Filled 2016-02-15 (×5): qty 1

## 2016-02-15 MED ORDER — METFORMIN HCL 500 MG PO TABS
500.0000 mg | ORAL_TABLET | Freq: Two times a day (BID) | ORAL | Status: DC
Start: 2016-02-15 — End: 2016-02-17
  Administered 2016-02-15 – 2016-02-17 (×4): 500 mg via ORAL
  Filled 2016-02-15 (×4): qty 1

## 2016-02-15 MED ORDER — PREDNISONE 20 MG PO TABS
20.0000 mg | ORAL_TABLET | Freq: Every day | ORAL | Status: DC
  Administered 2016-02-16: 20 mg via ORAL
  Filled 2016-02-15: qty 1

## 2016-02-15 NOTE — Consult Note (Signed)
Reason for Consult: elevated troponin, hx of A fib hx of cardiomyopathy.   Referring Physician: Dr. Olevia Bowens   PCP:  PROVIDER NOT IN SYSTEM  Primary Cardiologist:Dr. Nahser saw in consult previously, no office visits.  Darius Saunders is an 75 y.o. male.    Chief Complaint: admitted 02/14/16 with dyspnea and wheezing.   HPI: Asked to see 73 YOM that we have seen in 2014 for A fib in association with PNA and dyspnea.  PMHx of CAD, (LAD- 3.0 x 18 mm Xience Xpedition and  2.75 x 8 mm Xience Xpedition, RCA - 2.5x15 Xience Xpedition, 08/29/2011) , HTN, DM .  At that discharge it was noted he had been thought to be a poor Coumadin candidate because he has some memory problems, but at discharge on Apixaban. Echocardiogram showed severely impaired LVEF 20-25%, no apical thrombus.  He remained in rate controlled a fib.  No follow up noted.  He has moved to [place].    Pt admitted yesterday after presenting with SOB, coughing and wheezing.  He also has tightness across chest.  He tells me today that the tightness was with the SOB and wheezing.  Now the tightness resolved.  He was given duonebs and improved.  +PNA--LLL on CXR.  He is on Xarelto for his PAF.   Now troponin 0.20.  Pt on ARB, amiodarone, spironolactone and BB.   EKG SR with  biV pacing. And PVCs troponins 0.19-->0.20-->0.21-->0.21 Cr 1.55-->1.64 BNP 497  Today he has no complaints and he confirms BiV ICD, but does not have card that reveals what brand of device.  On tele he is BiV pacing with some PVCs.  He noted it has been sometime since he has had device check.   He is to see his cardiologist next month.    Past Medical History:  Diagnosis Date  . Atrial fibrillation ([place])   . Cardiomyopathy ([place])   . Diabetes mellitus without complication ([place])   . Diabetic neuropathy ([place])   . Hypertension   . MI (myocardial infarction) ([place])   . PAF (paroxysmal atrial fibrillation) ([place])     Past Surgical History:  Procedure  Laterality Date  . APPENDECTOMY    . BIV ICD GENERTAOR CHANGE OUT     hx of BIV ICD since 2014.   . TONSILLECTOMY      Family History  Problem Relation Age of Onset  . Hypertension Sister    Social History:  reports that he has never smoked. He has never used smokeless tobacco. He reports that he drinks alcohol. He reports that he does not use drugs.  Allergies:  Allergies  Allergen Reactions  . Other     Steroids- caused him to stay awake  . Statins Other (See Comments)    Rash, memory loss    OUTPATIENT MEDICATIONS: Amiodarone, coreg 3.125 BID, dig 0.125, lasix 20, glipizide, metformin, xarelto, aldactone, losartan and many vitamins.   Current Medications: Scheduled Meds: . amiodarone  200 mg Oral Daily  . azithromycin  500 mg Intravenous Q24H  . carvedilol  3.125 mg Oral BID WC  . cefTRIAXone (ROCEPHIN)  IV  1 g Intravenous Q24H  . cholecalciferol  5,000 Units Oral Daily  . digoxin  0.125 mg Oral Daily  . famotidine  20 mg Oral Daily  . ferrous sulfate  325 mg Oral Daily  . folic acid  1 mg Oral Daily  . furosemide  20 mg Oral Daily  . insulin  aspart  0-9 Units Subcutaneous TID WC  . magnesium oxide  200 mg Oral Daily  . predniSONE  40 mg Oral Q breakfast  . Rivaroxaban  15 mg Oral QPM  . spironolactone  12.5 mg Oral Daily   Continuous Infusions:  PRN Meds:.benzonatate, levalbuterol, nitroGLYCERIN   Results for orders placed or performed during the hospital encounter of 02/14/16 (from the past 48 hour(s))  Brain natriuretic peptide     Status: Abnormal   Collection Time: 02/14/16  2:58 PM  Result Value Ref Range   B Natriuretic Peptide 497.0 (H) 0.0 - 100.0 pg/mL  CBC with Differential     Status: Abnormal   Collection Time: 02/14/16  3:11 PM  Result Value Ref Range   WBC 9.6 4.0 - 10.5 K/uL   RBC 4.76 4.22 - 5.81 MIL/uL   Hemoglobin 14.3 13.0 - 17.0 g/dL   HCT 42.6 39.0 - 52.0 %   MCV 89.5 78.0 - 100.0 fL   MCH 30.0 26.0 - 34.0 pg   MCHC 33.6 30.0 -  36.0 g/dL   RDW 13.4 11.5 - 15.5 %   Platelets 165 150 - 400 K/uL   Neutrophils Relative % 69 %   Neutro Abs 6.6 1.7 - 7.7 K/uL   Lymphocytes Relative 17 %   Lymphs Abs 1.7 0.7 - 4.0 K/uL   Monocytes Relative 12 %   Monocytes Absolute 1.1 (H) 0.1 - 1.0 K/uL   Eosinophils Relative 2 %   Eosinophils Absolute 0.2 0.0 - 0.7 K/uL   Basophils Relative 0 %   Basophils Absolute 0.0 0.0 - 0.1 K/uL  I-stat troponin, ED     Status: Abnormal   Collection Time: 02/14/16  3:16 PM  Result Value Ref Range   Troponin i, poc 0.19 (HH) 0.00 - 0.08 ng/mL   Comment NOTIFIED PHYSICIAN    Comment 3            Comment: Due to the release kinetics of cTnI, a negative result within the first hours of the onset of symptoms does not rule out myocardial infarction with certainty. If myocardial infarction is still suspected, repeat the test at appropriate intervals.   Basic metabolic panel     Status: Abnormal   Collection Time: 02/14/16  3:30 PM  Result Value Ref Range   Sodium 139 135 - 145 mmol/L   Potassium 4.3 3.5 - 5.1 mmol/L   Chloride 106 101 - 111 mmol/L   CO2 22 22 - 32 mmol/L   Glucose, Bld 138 (H) 65 - 99 mg/dL   BUN 27 (H) 6 - 20 mg/dL   Creatinine, Ser 1.55 (H) 0.61 - 1.24 mg/dL   Calcium 9.3 8.9 - 10.3 mg/dL   GFR calc non Af Amer 42 (L) >60 mL/min   GFR calc Af Amer 49 (L) >60 mL/min    Comment: (NOTE) The eGFR has been calculated using the CKD EPI equation. This calculation has not been validated in all clinical situations. eGFR's persistently <60 mL/min signify possible Chronic Kidney Disease.    Anion gap 11 5 - 15  Troponin I (q 6hr x 3)     Status: Abnormal   Collection Time: 02/14/16  6:48 PM  Result Value Ref Range   Troponin I 0.20 (HH) <0.03 ng/mL    Comment: CRITICAL RESULT CALLED TO, READ BACK BY AND VERIFIED WITH: DELENO,M AT 1927 ON 02/14/16 BY MOSLEY,J   Glucose, capillary     Status: Abnormal   Collection Time: 02/14/16  9:50  PM  Result Value Ref Range    Glucose-Capillary 165 (H) 65 - 99 mg/dL  Troponin I (q 6hr x 3)     Status: Abnormal   Collection Time: 02/14/16 11:17 PM  Result Value Ref Range   Troponin I 0.21 (HH) <0.03 ng/mL    Comment: CRITICAL VALUE NOTED.  VALUE IS CONSISTENT WITH PREVIOUSLY REPORTED AND CALLED VALUE.  Basic metabolic panel     Status: Abnormal   Collection Time: 02/15/16  5:43 AM  Result Value Ref Range   Sodium 138 135 - 145 mmol/L   Potassium 4.2 3.5 - 5.1 mmol/L   Chloride 108 101 - 111 mmol/L   CO2 21 (L) 22 - 32 mmol/L   Glucose, Bld 132 (H) 65 - 99 mg/dL   BUN 27 (H) 6 - 20 mg/dL   Creatinine, Ser 1.64 (H) 0.61 - 1.24 mg/dL   Calcium 8.9 8.9 - 10.3 mg/dL   GFR calc non Af Amer 40 (L) >60 mL/min   GFR calc Af Amer 46 (L) >60 mL/min    Comment: (NOTE) The eGFR has been calculated using the CKD EPI equation. This calculation has not been validated in all clinical situations. eGFR's persistently <60 mL/min signify possible Chronic Kidney Disease.    Anion gap 9 5 - 15  CBC     Status: Abnormal   Collection Time: 02/15/16  5:43 AM  Result Value Ref Range   WBC 6.8 4.0 - 10.5 K/uL   RBC 4.07 (L) 4.22 - 5.81 MIL/uL   Hemoglobin 12.1 (L) 13.0 - 17.0 g/dL   HCT 36.2 (L) 39.0 - 52.0 %   MCV 88.9 78.0 - 100.0 fL   MCH 29.7 26.0 - 34.0 pg   MCHC 33.4 30.0 - 36.0 g/dL   RDW 13.5 11.5 - 15.5 %   Platelets 157 150 - 400 K/uL  Troponin I (q 6hr x 3)     Status: Abnormal   Collection Time: 02/15/16  5:43 AM  Result Value Ref Range   Troponin I 0.21 (HH) <0.03 ng/mL    Comment: CRITICAL VALUE NOTED.  VALUE IS CONSISTENT WITH PREVIOUSLY REPORTED AND CALLED VALUE.  Glucose, capillary     Status: Abnormal   Collection Time: 02/15/16  7:21 AM  Result Value Ref Range   Glucose-Capillary 144 (H) 65 - 99 mg/dL   Dg Chest 2 View  Result Date: 02/14/2016 CLINICAL DATA:  Chest pain and shortness of breath.  Cough. EXAM: CHEST  2 VIEW COMPARISON:  07/10/2013 FINDINGS: Chronic cardiomegaly. There is a  biventricular ICD/pacer from the left. Stable aortic and hilar contours. Hazy basilar opacities asymmetric to the left. No cephalized blood flow. Trace effusions. IMPRESSION: Bibasilar opacity with asymmetric dense appearance on the left primarily concerning for pneumonia. Trace pleural effusions. Electronically Signed   By: Monte Fantasia M.D.   On: 02/14/2016 14:48    ROS: General:no colds or fevers, no weight changes Skin:no rashes or ulcers HEENT:no blurred vision, no congestion CV:see HPI PUL:see HPI GI:no diarrhea constipation or melena, no indigestion GU:no hematuria, no dysuria MS:no joint pain, no claudication Neuro:no syncope, no lightheadedness Endo:+ diabetes well controlled per pt, no thyroid disease  Blood pressure 125/78, pulse 85, temperature 98.7 F (37.1 C), temperature source Oral, resp. rate 18, height 6' (1.829 m), weight 210 lb 6.4 oz (95.4 kg), SpO2 95 %.  Wt Readings from Last 3 Encounters:  02/15/16 210 lb 6.4 oz (95.4 kg)  01/22/14 195 lb (88.5 kg)  07/11/13 206 lb  9.1 oz (93.7 kg)    PE: General:Pleasant affect, NAD Skin:Warm and dry, brisk capillary refill HEENT:normocephalic, sclera clear, mucus membranes moist Neck:supple, no JVD, no bruits, sitting upright  Heart:S1S2 RRR without murmur, gall[place], rub or click Lungs:clear without rales, rhonchi, or wheezes QPR:FFMB, non tender, + BS, do not palpate liver spleen or masses Ext:no lower ext edema, 2+ pedal pulses, 2+ radial pulses Neuro:alert and oriented X 3, MAE, follows commands, + facial symmetry    Assessment/Plan Active Problems:   Pneumonia  1. Elevated troponin, may be demand ischemia with hx of cardiomyopathy and CAD.  Flat numbers.  2. Cardiomyopathy now appears BiV /ICD pacer and on medications including BB, no ace or arb due to CKD, on amiodarone. Also aldactone.  ? Echo though pt has had BiV ICD since last one.  Would be helpful to obtain records from [place].  Pt stated he has appt with  cardiologist at home next month.   Dr. Stanford Breed to see.   3.  PAF anticoagulation with Xarelto continues.  Maintaining SR with bIV pacing. CHA2DS2VASc score of 5 - he does not know brand of PPM will check with companies and have it interrogated.   4. PNA per IM on ABX.  He feels better.     Cecilie Kicks  Nurse Practitioner Certified [place] Pager [phone] or after 5pm or weekends call [phone] 02/15/2016, 8:52 AM  As above, patient seen and examined. Briefly he is a 76 year old male with past medical history of coronary artery disease, paroxysmal atrial fibrillation, cardiomyopathy, diabetes mellitus, hypertension, prior ICD for evaluation of elevated troponin. Patient lives in [place] and is visiting. 2 weeks prior to admission he described general malaise. He denies fevers, chills or productive cough. No hemoptysis. 2 days prior to admission he noticed increased dyspnea with lying flat and improved sitting up. He noted some dyspnea on exertion. No pedal edema, chest pain or syncope. Chest x-ray with possible left pneumonia. Electrocardiogram shows sinus with ventricular pacing and PVC. Laboratories show BNP 497, BUN 27 and creatinine 14. Troponin 0.19, 0.20 and 0.21.  1 elevated troponin-minimally elevated in the setting of renal insufficiency. Patient has not had chest pain. No clear trend. Not consistent with acute coronary syndrome. Would not pursue further ischemia evaluation. 2 coronary artery disease-no aspirin given need for anticoagulation. Intolerant to statins. 3 acute on chronic systolic congestive heart failure-possible small contribution to presentation from CHF. Increase Lasix to 40 mg daily for 3 days and then resume 20 mg daily long-term. 4 Possible pneumonia-antibiotics per primary care. 5 paroxysmal atrial fibrillation-patient remains in sinus. Continue amiodarone and xarelto. 6 ICM-continue coreg and cozaar at DC. 7 Prior ICD  Patient has a  follow-up appointment with his cardiologist in [place] in one month. We will sign off. Please call with questions. Kirk Ruths

## 2016-02-15 NOTE — Progress Notes (Signed)
Echo is slightly improved, it may be helpful to send copy of results back to [facility]. With Pt-  He is to see his cardiologist next month.

## 2016-02-15 NOTE — Progress Notes (Signed)
[facility] PROGRESS NOTE    Progress Note  Darius Saunders  ZOX:[id number] DOB: 10-28-1940 DOA: 02/14/2016 PCP: PROVIDER NOT IN SYSTEM     Brief Narrative:   Darius Saunders is an 75 y.o. 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  Assessment/Plan:   CAP (community acquired pneumonia) Continue IV Rocephin and azithromycin, sputum cultures and urine antigens are pending. No wheezing today will taper down prednisone quickly. He is afebrile.  Chronic kidney disease stage II: Seems at baseline continue to monitor.  DM2 (diabetes mellitus, type 2) (HCC): Good control  Chronic atrial fibrillation (HCC) Weight control continue Eluquis, coreg, amiodarone.  Chronic combined systolic and diastolic congestive heart failure (HCC): Seems to be euvolemic continue current home regimen 2-D echo is pending.   DVT prophylaxis: Xarelto Family Communication:none Disposition Plan/Barrier to D/C: home in 1-2 days Code Status:     Code Status Orders        Start     Ordered   02/14/16 1746  Full code  Continuous     02/14/16 1745    Code Status History    Date Active Date Inactive Code Status Order ID Comments User Context   07/08/2013  8:03 PM 07/13/2013  4:21 PM Full Code [id number]  Hillary Bow, DO ED        IV Access:    Peripheral IV   Procedures and diagnostic studies:   Dg Chest 2 View  Result Date: 02/14/2016 CLINICAL DATA:  Chest pain and shortness of breath.  Cough. EXAM: CHEST  2 VIEW COMPARISON:  07/10/2013 FINDINGS: Chronic cardiomegaly. 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 Stable aortic and hilar contours. Hazy basilar opacities asymmetric to the left. No cephalized blood flow. Trace effusions. IMPRESSION: Bibasilar opacity with asymmetric dense  appearance on the left primarily concerning for pneumonia. Trace pleural effusions. Electronically Signed   By: Marnee Spring M.D.   On: 02/14/2016 14:48     Medical Consultants:    None.  Anti-Infectives:   none  Subjective:    Darius Saunders he relates his shortness of breath is much better.  Objective:    Vitals:   02/14/16 1619 02/14/16 1745 02/14/16 2153 02/15/16 0535  BP: 140/85 127/75 (!) 106/59 125/78  Pulse: 86 89 83 85  Resp: Temp:  97.7 F (36.5 C) 98.4 F (36.9 C) 98.7 F (37.1 C)  TempSrc:  Oral Oral Oral  SpO2: 98% 98% 90% 95%  Weight:  95.4 kg (210 lb 4.8 oz)  95.4 kg (210 lb 6.4 oz)  Height:  6' (1.829 m)      Intake/Output Summary (Last 24 hours) at 02/15/16 0953 Last data filed at 02/14/16 1900  Gross per 24 hour  Intake              240 ml  Output                0 ml  Net              240 ml   Filed Weights   02/14/16 1745 02/15/16 0535  Weight: 95.4 kg (210 lb 4.8 oz) 95.4 kg (210 lb 6.4 oz)    Exam: General exam: In no acute distress. Respiratory system: Good air movement and clear to auscultation. Cardiovascular system: S1 &  S2 heard, RRR.  Gastrointestinal system: Abdomen is nondistended, soft and nontender.  Central nervous system: Alert and oriented. No focal neurological deficits. Extremities: No pedal edema. Skin: No rashes, lesions or ulcers Psychiatry: Judgement and insight appear normal. Mood & affect appropriate.    Data Reviewed:    Labs: Basic Metabolic Panel:  Recent Labs Lab 02/14/16 1530 02/15/16 0543  NA 139 138  K 4.3 4.2  CL 106 108  CO2 22 21*  GLUCOSE 138* 132*  BUN 27* 27*  CREATININE 1.55* 1.64*  CALCIUM 9.3 8.9   GFR Estimated Creatinine Clearance: 47.3 mL/min (by C-G formula based on SCr of 1.64 mg/dL). Liver Function Tests: No results for input(s): AST, ALT, ALKPHOS, BILITOT, PROT, ALBUMIN in the last 168 hours. No results for input(s): LIPASE, AMYLASE in the last 168  hours. No results for input(s): AMMONIA in the last 168 hours. Coagulation profile No results for input(s): INR, PROTIME in the last 168 hours.  CBC:  Recent Labs Lab 02/14/16 1511 02/15/16 0543  WBC 9.6 6.8  NEUTROABS 6.6  --   HGB 14.3 12.1*  HCT 42.6 36.2*  MCV 89.5 88.9  PLT 165 157   Cardiac Enzymes:  Recent Labs Lab 02/14/16 1848 02/14/16 2317 02/15/16 0543  TROPONINI 0.20* 0.21* 0.21*   BNP (last 3 results) No results for input(s): PROBNP in the last 8760 hours. CBG:  Recent Labs Lab 02/14/16 2150 02/15/16 0721  GLUCAP 165* 144*   D-Dimer: No results for input(s): DDIMER in the last 72 hours. Hgb A1c: No results for input(s): HGBA1C in the last 72 hours. Lipid Profile: No results for input(s): CHOL, HDL, LDLCALC, TRIG, CHOLHDL, LDLDIRECT in the last 72 hours. Thyroid function studies: No results for input(s): TSH, T4TOTAL, T3FREE, THYROIDAB in the last 72 hours.  Invalid input(s): FREET3 Anemia work up: No results for input(s): VITAMINB12, FOLATE, FERRITIN, TIBC, IRON, RETICCTPCT in the last 72 hours. Sepsis Labs:  Recent Labs Lab 02/14/16 1511 02/15/16 0543  WBC 9.6 6.8   Microbiology No results found for this or any previous visit (from the past 240 hour(s)).   Medications:   . amiodarone  200 mg Oral Daily  . azithromycin  500 mg Intravenous Q24H  . carvedilol  3.125 mg Oral BID WC  . cefTRIAXone (ROCEPHIN)  IV  1 g Intravenous Q24H  . cholecalciferol  5,000 Units Oral Daily  . digoxin  0.125 mg Oral Daily  . famotidine  20 mg Oral Daily  . ferrous sulfate  325 mg Oral Daily  . folic acid  1 mg Oral Daily  . furosemide  20 mg Oral Daily  . insulin aspart  0-9 Units Subcutaneous TID WC  . magnesium oxide  200 mg Oral Daily  . predniSONE  40 mg Oral Q breakfast  . Rivaroxaban  15 mg Oral QPM  . spironolactone  12.5 mg Oral Daily   Continuous Infusions:   Time spent: 25 min   LOS: 1 day   Marinda Elk  [facility] Pager [phone]  *Please refer to [email or web address], password [email or web address] to get updated schedule on who will round on this patient, as hospitalists switch teams weekly. If 7PM-7AM, please contact night-coverage at [email or web address], password [email or web address] for any overnight needs.  02/15/2016, 9:53 AM

## 2016-02-15 NOTE — Progress Notes (Signed)
  Echocardiogram 2D Echocardiogram with Definity has been performed.  Leta Jungling M 02/15/2016, 11:56 AM

## 2016-02-16 LAB — HEMOGLOBIN A1C
Hgb A1c MFr Bld: 7.6 % — ABNORMAL HIGH (ref 4.8–5.6)
MEAN PLASMA GLUCOSE: 171 mg/dL

## 2016-02-16 LAB — LEGIONELLA PNEUMOPHILA SEROGP 1 UR AG: L. pneumophila Serogp 1 Ur Ag: NEGATIVE

## 2016-02-16 LAB — GLUCOSE, CAPILLARY
GLUCOSE-CAPILLARY: 212 mg/dL — AB (ref 65–99)
GLUCOSE-CAPILLARY: 229 mg/dL — AB (ref 65–99)
Glucose-Capillary: 152 mg/dL — ABNORMAL HIGH (ref 65–99)
Glucose-Capillary: 296 mg/dL — ABNORMAL HIGH (ref 65–99)

## 2016-02-16 MED ORDER — AZITHROMYCIN 250 MG PO TABS
500.0000 mg | ORAL_TABLET | Freq: Every day | ORAL | Status: DC
  Administered 2016-02-16 – 2016-02-17 (×2): 500 mg via ORAL
  Filled 2016-02-16 (×2): qty 2

## 2016-02-16 MED ORDER — PREDNISONE 5 MG PO TABS
10.0000 mg | ORAL_TABLET | Freq: Every day | ORAL | Status: DC
  Administered 2016-02-17: 10 mg via ORAL
  Filled 2016-02-16: qty 2

## 2016-02-16 NOTE — Progress Notes (Signed)
PHARMACIST - PHYSICIAN COMMUNICATION DR:   Radonna Ricker CONCERNING: Antibiotic IV to Oral Route Change Policy  RECOMMENDATION: This patient is receiving Zithromax by the intravenous route.  Based on criteria approved by the Pharmacy and Therapeutics Committee, the antibiotic(s) is/are being converted to the equivalent oral dose form(s).   DESCRIPTION: These criteria include:  Patient being treated for a respiratory tract infection, urinary tract infection, cellulitis or clostridium difficile associated diarrhea if on metronidazole  The patient is not neutropenic and does not exhibit a GI malabsorption state  The patient is eating (either orally or via tube) and/or has been taking other orally administered medications for a least 24 hours  The patient is improving clinically and has a Tmax < 100.5  If you have questions about this conversion, please contact the Pharmacy Department  []   [phone] )  [facility] []   [phone] )  [facility] []   [phone] )  [facility] []   [phone] )  [facility] [x]   [phone] )  [facility]   Junita Push, PharmD, BCPS Pager: [phone] 02/16/2016@9 :45 AM

## 2016-02-16 NOTE — Progress Notes (Signed)
[facility] PROGRESS NOTE    Progress Note  Darius Saunders  ZOX:[id number] DOB: June 13, 1941 DOA: 02/14/2016 PCP: PROVIDER NOT IN SYSTEM     Brief Narrative:   [name] is an 75 y.o. 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  Assessment/Plan:   CAP (community acquired pneumonia) Cultures have remained negative, has remained afebrile change antibiotic coverage to Levaquin. Continue to taper down prednisone quickly. Wheezing has resolved.  Continue to titrate prednisone quickly as it may cost retained fluid. Mild elevation in cardiac biomarkers are negative to chronic renal disease.  Chronic kidney disease stage II: Seems at baseline continue to monitor.  DM2 (diabetes mellitus, type 2) (HCC): Good control  Chronic atrial fibrillation (HCC) Rate control continue Eluquis, coreg, amiodarone.  Chronic combined systolic and diastolic congestive heart failure (HCC): Seems to be euvolemic continue current home regimen 2-D echo is shows improvement compared to previous. Would be a good idea to send record of the echo to his cardiologist   DVT prophylaxis: Xarelto Family Communication:none Disposition Plan/Barrier to D/C: home in am Code Status:     Code Status Orders        Start     Ordered   02/14/16 1746  Full code  Continuous     02/14/16 1745    Code Status History    Date Active Date Inactive Code Status Order ID Comments User Context   07/08/2013  8:03 PM 07/13/2013  4:21 PM Full Code [id number]  Hillary Bow, DO ED        IV Access:    Peripheral IV   Procedures and diagnostic studies:   Dg Chest 2 View  Result Date: 02/14/2016 CLINICAL DATA:  Chest pain and shortness of breath.  Cough. EXAM: CHEST  2 VIEW COMPARISON:  07/10/2013 FINDINGS:  Chronic cardiomegaly. 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 Stable aortic and hilar contours. Hazy basilar opacities asymmetric to the left. No cephalized blood flow. Trace effusions. IMPRESSION: Bibasilar opacity with asymmetric dense appearance on the left primarily concerning for pneumonia. Trace pleural effusions. Electronically Signed   By: Marnee Spring M.D.   On: 02/14/2016 14:48     Medical Consultants:    None.  Anti-Infectives:   none  Subjective:    Darius Saunders he relates his shortness of breath is much better.  Objective:    Vitals:   02/15/16 0535 02/15/16 1310 02/15/16 2126 02/16/16 0555  BP: 125/78 (!) 147/87 (!) 142/87 100/62  Pulse: 85 91 97 98  Resp: Temp: 98.7 F (37.1 C) 97.6 F (36.4 C) 98.8 F (37.1 C) 98 F (36.7 C)  TempSrc: Oral Oral Oral Oral  SpO2: 95% 96% 98% 98%  Weight: 95.4 kg (210 lb 6.4 oz)     Height:        Intake/Output Summary (Last 24 hours) at 02/16/16 0920 Last data filed at 02/15/16 1800  Gross per 24 hour  Intake              780 ml  Output             1054 ml  Net             -274 ml   Filed Weights   02/14/16 1745 02/15/16 0535  Weight: 95.4 kg (210 lb  4.8 oz) 95.4 kg (210 lb 6.4 oz)    Exam: General exam: In no acute distress. Respiratory system: Good air movement and clear to auscultation. Cardiovascular system: S1 & S2 heard, RRR. -JVD Gastrointestinal system: Abdomen is nondistended, soft and nontender.  Central nervous system: Alert and oriented. No focal neurological deficits. Extremities: No pedal edema. Skin: No rashes, lesions or ulcers Psychiatry: Judgement and insight appear normal. Mood & affect appropriate.    Data Reviewed:    Labs: Basic Metabolic Panel:  Recent Labs Lab 02/14/16 1530 02/15/16 0543  NA 139 138  K 4.3 4.2  CL 106 108  CO2 22 21*  GLUCOSE 138* 132*  BUN 27* 27*  CREATININE 1.55* 1.64*  CALCIUM 9.3 8.9   GFR Estimated Creatinine  Clearance: 47.3 mL/min (by C-G formula based on SCr of 1.64 mg/dL). Liver Function Tests: No results for input(s): AST, ALT, ALKPHOS, BILITOT, PROT, ALBUMIN in the last 168 hours. No results for input(s): LIPASE, AMYLASE in the last 168 hours. No results for input(s): AMMONIA in the last 168 hours. Coagulation profile No results for input(s): INR, PROTIME in the last 168 hours.  CBC:  Recent Labs Lab 02/14/16 1511 02/15/16 0543  WBC 9.6 6.8  NEUTROABS 6.6  --   HGB 14.3 12.1*  HCT 42.6 36.2*  MCV 89.5 88.9  PLT 165 157   Cardiac Enzymes:  Recent Labs Lab 02/14/16 1848 02/14/16 2317 02/15/16 0543  TROPONINI 0.20* 0.21* 0.21*   BNP (last 3 results) No results for input(s): PROBNP in the last 8760 hours. CBG:  Recent Labs Lab 02/15/16 0721 02/15/16 1159 02/15/16 1653 02/15/16 2117 02/16/16 0750  GLUCAP 144* 198* 363* 286* 152*   D-Dimer: No results for input(s): DDIMER in the last 72 hours. Hgb A1c:  Recent Labs  02/15/16 0543  HGBA1C 7.6*   Lipid Profile: No results for input(s): CHOL, HDL, LDLCALC, TRIG, CHOLHDL, LDLDIRECT in the last 72 hours. Thyroid function studies: No results for input(s): TSH, T4TOTAL, T3FREE, THYROIDAB in the last 72 hours.  Invalid input(s): FREET3 Anemia work up: No results for input(s): VITAMINB12, FOLATE, FERRITIN, TIBC, IRON, RETICCTPCT in the last 72 hours. Sepsis Labs:  Recent Labs Lab 02/14/16 1511 02/15/16 0543  WBC 9.6 6.8   Microbiology Recent Results (from the past 240 hour(s))  Culture, blood (routine x 2) Call MD if unable to obtain prior to antibiotics being given     Status: None (Preliminary result)   Collection Time: 02/14/16  6:50 PM  Result Value Ref Range Status   Specimen Description BLOOD RIGHT HAND  Final   Special Requests IN PEDIATRIC BOTTLE 2.5CC  Final   Culture   Final    NO GROWTH < 24 HOURS Performed at [facility]    Report Status PENDING  Incomplete  Culture, blood (routine  x 2) Call MD if unable to obtain prior to antibiotics being given     Status: None (Preliminary result)   Collection Time: 02/14/16  6:50 PM  Result Value Ref Range Status   Specimen Description BLOOD LEFT HAND  Final   Special Requests IN PEDIATRIC BOTTLE 2CC  Final   Culture   Final    NO GROWTH < 24 HOURS Performed at [facility]    Report Status PENDING  Incomplete     Medications:   . amiodarone  200 mg Oral Daily  . azithromycin  500 mg Intravenous Q24H  . carvedilol  3.125 mg Oral BID WC  . cefTRIAXone (ROCEPHIN)  IV  1 g Intravenous Q24H  . cholecalciferol  5,000 Units Oral Daily  . digoxin  0.125 mg Oral Daily  . famotidine  20 mg Oral Daily  . ferrous sulfate  325 mg Oral Daily  . folic acid  1 mg Oral Daily  . furosemide  20 mg Oral Daily  . insulin aspart  0-9 Units Subcutaneous TID WC  . magnesium oxide  200 mg Oral Daily  . metFORMIN  500 mg Oral BID WC  . omega-3 acid ethyl esters  1 g Oral BID  . [START ON 02/17/2016] predniSONE  10 mg Oral Q breakfast  . Rivaroxaban  15 mg Oral QPM  . spironolactone  12.5 mg Oral Daily   Continuous Infusions:   Time spent: 15 min   LOS: 2 days   Marinda Elk  [facility] Pager [phone]  *Please refer to [email or web address], password [email or web address] to get updated schedule on who will round on this patient, as hospitalists switch teams weekly. If 7PM-7AM, please contact night-coverage at [email or web address], password [email or web address] for any overnight needs.  02/16/2016, 9:20 AM

## 2016-02-16 NOTE — Progress Notes (Signed)
Pharmacist Heart Failure Core Measure Documentation  Assessment: Darius Saunders has an EF documented as 25-30% on 02/15/16 by ECHO.  Rationale: Heart failure patients with left ventricular systolic dysfunction (LVSD) and an EF < 40% should be prescribed an angiotensin converting enzyme inhibitor (ACEI) or angiotensin receptor blocker (ARB) at discharge unless a contraindication is documented in the medical record.  This patient is not currently on an ACEI or ARB for HF.  This note is being placed in the record in order to provide documentation that a contraindication to the use of these agents is present for this encounter.  ACE Inhibitor or Angiotensin Receptor Blocker is contraindicated (specify all that apply)  []   ACEI allergy AND ARB allergy []   Angioedema []   Moderate or severe aortic stenosis []   Hyperkalemia [x]   Hypotension []   Renal artery stenosis [x]   Worsening renal function, preexisting renal disease or dysfunction  *Noted plans to resume patient's home losartan at hospital discharge.    Elson Clan 02/16/2016 9:43 AM

## 2016-02-17 LAB — GLUCOSE, CAPILLARY: GLUCOSE-CAPILLARY: 137 mg/dL — AB (ref 65–99)

## 2016-02-17 MED ORDER — PREDNISONE 5 MG PO TABS: 5.0000 mg | ORAL_TABLET | Freq: Every day | ORAL | 0 refills | Status: DC

## 2016-02-17 MED ORDER — AZITHROMYCIN 500 MG PO TABS: 500.0000 mg | ORAL_TABLET | Freq: Every day | ORAL | 0 refills | Status: DC

## 2016-02-17 NOTE — Discharge Summary (Signed)
Physician Discharge Summary  Darius Saunders ZOX:[id number] DOB: October 09, 1940 DOA: 02/14/2016  PCP: PROVIDER NOT IN SYSTEM  Admit date: 02/14/2016 Discharge date: 02/17/2016  Admitted From: home Disposition:  Home  Recommendations for Outpatient Follow-up:  1. Follow up with PCP in 1-2 weeks 2. Please obtain BMP/CBC in one week   Home Health:no Equipment/Devices:none  Discharge Condition:stable CODE STATUS:full Diet recommendation: Heart Healthy   Brief/Interim Summary: 75 yo male with known history of atrial fib on Xarelto, DM, combined systolic and diastolic CHF per last ECHO in 8119 with EF 20 - 25% and grade I diastolic CHF presents to [facility] ED with main concern of several days duration of progressively worsening dyspnea, present at rest occasionally but worse with exertion, associated with subjective fevers, chills, mixed non productive and productive cough or clear and yellow sputum, chest pressure that is worse with lying down flat and with coughing spells.   Discharge Diagnoses:  CAP (community acquired pneumonia): Started on IV abx. Has defervesce and leukocytosis resolved. Cultures have remained negative,  Has to be started on prednisone due to wheezing. Will cont prednisone for 1 more day and azithromycin for 3 more days Mild elevation in cardiac biomarkers are due  to chronic renal disease. He relate no CP.  Chronic kidney disease stage II: Seems at baseline continue to monitor.  DM2 (diabetes mellitus, type 2) (HCC): Good control No changes made to his medication.  Chronic atrial fibrillation [facility]): Rate control continue Eluquis, coreg, amiodarone.  Chronic combined systolic and diastolic congestive heart failure (HCC): Seems to be euvolemic continue current home regimen 2-D echo is shows improvement compared to previous.    Discharge Instructions  Discharge Instructions    Diet - low sodium heart healthy    Complete by:  As directed   Increase activity slowly     Complete by:  As directed       Medication List    TAKE these medications   amiodarone 200 MG tablet Commonly known as:  PACERONE Take 200 mg by mouth daily.   azithromycin 500 MG tablet Commonly known as:  ZITHROMAX Take 1 tablet (500 mg total) by mouth daily.   benzonatate 100 MG capsule Commonly known as:  TESSALON Take 100 mg by mouth 3 (three) times daily as needed for cough.   carvedilol 6.25 MG tablet Commonly known as:  COREG Take 3.125 mg by mouth 2 (two) times daily with a meal.   CoQ-10 100 MG Caps Take 100 mg by mouth every evening.   digoxin 0.125 MG tablet Commonly known as:  LANOXIN Take 1 tablet (0.125 mg total) by mouth daily.   Fish Oil 1000 MG Caps Take 1,000 mg by mouth every morning.   folic acid 1 MG tablet Commonly known as:  FOLVITE Take 1 mg by mouth daily.   furosemide 20 MG tablet Commonly known as:  LASIX Take 20 mg by mouth daily.   glipiZIDE 10 MG tablet Commonly known as:  GLUCOTROL Take 10 mg by mouth 2 (two) times daily.   ICAPS AREDS 2 Caps Take 1 tablet by mouth 2 (two) times daily.   Iron (Ferrous Sulfate) 142 (45 Fe) MG Tbcr Take 142 mg by mouth daily.   Magnesium 100 MG Tabs Take 100 mg by mouth daily.   metFORMIN 500 MG tablet Commonly known as:  GLUCOPHAGE Take 500 mg by mouth 2 (two) times daily with a meal.   metFORMIN 500 MG 24 hr tablet Commonly known as:  GLUCOPHAGE-XR Take 500 mg  by mouth 2 (two) times daily.   multivitamin with minerals Tabs tablet Take 1 tablet by mouth 2 (two) times daily.   nitroGLYCERIN 0.4 MG SL tablet Commonly known as:  NITROSTAT Place 0.4 mg under the tongue every 5 (five) minutes as needed for chest pain.   ranitidine 150 MG tablet Commonly known as:  ZANTAC Take 150 mg by mouth daily.   Rivaroxaban 15 MG Tabs tablet Commonly known as:  XARELTO Take 15 mg by mouth every evening.   spironolactone 25 MG tablet Commonly known as:  ALDACTONE Take 12.5 mg by mouth  daily.   VITAMIN B-12 IJ Inject 1,000 mg as directed every 30 (thirty) days.   vitamin C 500 MG tablet Commonly known as:  ASCORBIC ACID Take 500 mg by mouth 2 (two) times daily.   Vitamin D3 5000 units Caps Take 5,000 Units by mouth daily.       Allergies  Allergen Reactions  . Other     Steroids- caused him to stay awake  . Statins Other (See Comments)    Rash, memory loss    Consultations:  none   Procedures/Studies: Dg Chest 2 View  Result Date: 02/14/2016 CLINICAL DATA:  Chest pain and shortness of breath.  Cough. EXAM: CHEST  2 VIEW COMPARISON:  07/10/2013 FINDINGS: Chronic cardiomegaly. 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 Stable aortic and hilar contours. Hazy basilar opacities asymmetric to the left. No cephalized blood flow. Trace effusions. IMPRESSION: Bibasilar opacity with asymmetric dense appearance on the left primarily concerning for pneumonia. Trace pleural effusions. Electronically Signed   By: Marnee Spring M.D.   On: 02/14/2016 14:48      Subjective:   Discharge Exam: Vitals:   02/16/16 2112 02/17/16 0454  BP: 117/68 (!) 144/97  Pulse: 71 87  Resp: 18 18  Temp: 97.6 F (36.4 C) 99 F (37.2 C)   Vitals:   02/16/16 0555 02/16/16 1729 02/16/16 2112 02/17/16 0454  BP: 100/62  117/68 (!) 144/97  Pulse: 98  71 87  Resp: 18  18 18   Temp: 98 F (36.7 C)  97.6 F (36.4 C) 99 F (37.2 C)  TempSrc: Oral  Oral Oral  SpO2: 98%  98% 95%  Weight:  95 kg (209 lb 6.4 oz)  94.5 kg (208 lb 4.8 oz)  Height:        General: Pt is alert, awake, not in acute distress Cardiovascular: RRR, S1/S2 +, no rubs, no gallops Respiratory: CTA bilaterally, no wheezing, no rhonchi Abdominal: Soft, NT, ND, bowel sounds + Extremities: no edema, no cyanosis    The results of significant diagnostics from this hospitalization (including imaging, microbiology, ancillary and laboratory) are listed below for reference.     Microbiology: Recent  Results (from the past 240 hour(s))  Culture, blood (routine x 2) Call MD if unable to obtain prior to antibiotics being given     Status: None (Preliminary result)   Collection Time: 02/14/16  6:50 PM  Result Value Ref Range Status   Specimen Description BLOOD RIGHT HAND  Final   Special Requests IN PEDIATRIC BOTTLE 2.5CC  Final   Culture   Final    NO GROWTH 2 DAYS Performed at [facility]    Report Status PENDING  Incomplete  Culture, blood (routine x 2) Call MD if unable to obtain prior to antibiotics being given     Status: None (Preliminary result)   Collection Time: 02/14/16  6:50 PM  Result Value Ref Range  Status   Specimen Description BLOOD LEFT HAND  Final   Special Requests IN PEDIATRIC BOTTLE 2CC  Final   Culture   Final    NO GROWTH 2 DAYS Performed at [facility]    Report Status PENDING  Incomplete     Labs: BNP (last 3 results)  Recent Labs  02/14/16 1458  BNP 497.0*   Basic Metabolic Panel:  Recent Labs Lab 02/14/16 1530 02/15/16 0543  NA 139 138  K 4.3 4.2  CL 106 108  CO2 22 21*  GLUCOSE 138* 132*  BUN 27* 27*  CREATININE 1.55* 1.64*  CALCIUM 9.3 8.9   Liver Function Tests: No results for input(s): AST, ALT, ALKPHOS, BILITOT, PROT, ALBUMIN in the last 168 hours. No results for input(s): LIPASE, AMYLASE in the last 168 hours. No results for input(s): AMMONIA in the last 168 hours. CBC:  Recent Labs Lab 02/14/16 1511 02/15/16 0543  WBC 9.6 6.8  NEUTROABS 6.6  --   HGB 14.3 12.1*  HCT 42.6 36.2*  MCV 89.5 88.9  PLT 165 157   Cardiac Enzymes:  Recent Labs Lab 02/14/16 1848 02/14/16 2317 02/15/16 0543  TROPONINI 0.20* 0.21* 0.21*   BNP: Invalid input(s): POCBNP CBG:  Recent Labs Lab 02/16/16 0750 02/16/16 1207 02/16/16 1721 02/16/16 2117 02/17/16 0752  GLUCAP 152* 212* 229* 296* 137*   D-Dimer No results for input(s): DDIMER in the last 72 hours. Hgb A1c  Recent Labs  02/15/16 0543  HGBA1C 7.6*    Lipid Profile No results for input(s): CHOL, HDL, LDLCALC, TRIG, CHOLHDL, LDLDIRECT in the last 72 hours. Thyroid function studies No results for input(s): TSH, T4TOTAL, T3FREE, THYROIDAB in the last 72 hours.  Invalid input(s): FREET3 Anemia work up No results for input(s): VITAMINB12, FOLATE, FERRITIN, TIBC, IRON, RETICCTPCT in the last 72 hours. Urinalysis    Component Value Date/Time   COLORURINE YELLOW 07/08/2013 1820   APPEARANCEUR CLEAR 07/08/2013 1820   LABSPEC 1.023 07/08/2013 1820   PHURINE 5.5 07/08/2013 1820   GLUCOSEU NEGATIVE 07/08/2013 1820   HGBUR NEGATIVE 07/08/2013 1820   BILIRUBINUR NEGATIVE 07/08/2013 1820   KETONESUR NEGATIVE 07/08/2013 1820   PROTEINUR NEGATIVE 07/08/2013 1820   UROBILINOGEN 0.2 07/08/2013 1820   NITRITE NEGATIVE 07/08/2013 1820   LEUKOCYTESUR NEGATIVE 07/08/2013 1820   Sepsis Labs Invalid input(s): PROCALCITONIN,  WBC,  LACTICIDVEN Microbiology Recent Results (from the past 240 hour(s))  Culture, blood (routine x 2) Call MD if unable to obtain prior to antibiotics being given     Status: None (Preliminary result)   Collection Time: 02/14/16  6:50 PM  Result Value Ref Range Status   Specimen Description BLOOD RIGHT HAND  Final   Special Requests IN PEDIATRIC BOTTLE 2.5CC  Final   Culture   Final    NO GROWTH 2 DAYS Performed at [facility]    Report Status PENDING  Incomplete  Culture, blood (routine x 2) Call MD if unable to obtain prior to antibiotics being given     Status: None (Preliminary result)   Collection Time: 02/14/16  6:50 PM  Result Value Ref Range Status   Specimen Description BLOOD LEFT HAND  Final   Special Requests IN PEDIATRIC BOTTLE 2CC  Final   Culture   Final    NO GROWTH 2 DAYS Performed at [facility]    Report Status PENDING  Incomplete     Time coordinating discharge: Over 30 minutes  SIGNED:   Marinda Elk, MD  [facility] 02/17/2016, 10:54 AM Pager   If  7PM-7AM, please contact night-coverage [email or web address] Password [email or web address]

## 2016-02-19 LAB — CULTURE, BLOOD (ROUTINE X 2)
Culture: NO GROWTH
Culture: NO GROWTH

## 2016-02-27 DIAGNOSIS — Z95810 Presence of automatic (implantable) cardiac defibrillator: Secondary | ICD-10-CM

## 2016-02-27 DIAGNOSIS — I2510 Atherosclerotic heart disease of native coronary artery without angina pectoris: Secondary | ICD-10-CM

## 2016-02-27 DIAGNOSIS — I4891 Unspecified atrial fibrillation: Secondary | ICD-10-CM

## 2016-02-27 DIAGNOSIS — R7989 Other specified abnormal findings of blood chemistry: Secondary | ICD-10-CM

## 2016-02-27 DIAGNOSIS — I255 Ischemic cardiomyopathy: Secondary | ICD-10-CM

## 2016-02-27 DIAGNOSIS — I361 Nonrheumatic tricuspid (valve) insufficiency: Secondary | ICD-10-CM

## 2016-02-27 DIAGNOSIS — I513 Intracardiac thrombosis, not elsewhere classified: Secondary | ICD-10-CM

## 2016-03-10 DIAGNOSIS — R7989 Other specified abnormal findings of blood chemistry: Secondary | ICD-10-CM

## 2016-03-10 DIAGNOSIS — I5021 Acute systolic (congestive) heart failure: Secondary | ICD-10-CM

## 2016-03-10 DIAGNOSIS — I255 Ischemic cardiomyopathy: Secondary | ICD-10-CM

## 2016-03-10 DIAGNOSIS — I480 Paroxysmal atrial fibrillation: Secondary | ICD-10-CM

## 2016-03-11 ENCOUNTER — Encounter (INDEPENDENT_AMBULATORY_CARE_PROVIDER_SITE_OTHER): Payer: Medicare Other | Admitting: Physician Assistant

## 2016-03-11 ENCOUNTER — Ambulatory Visit (INDEPENDENT_AMBULATORY_CARE_PROVIDER_SITE_OTHER): Payer: Self-pay | Admitting: Vascular & Interventional Radiology

## 2016-03-14 ENCOUNTER — Encounter (INDEPENDENT_AMBULATORY_CARE_PROVIDER_SITE_OTHER): Payer: Self-pay | Admitting: INTERNAL MEDICINE CARDIOVASCULAR DISEASE

## 2016-03-14 ENCOUNTER — Other Ambulatory Visit (INDEPENDENT_AMBULATORY_CARE_PROVIDER_SITE_OTHER): Payer: Self-pay

## 2016-03-14 ENCOUNTER — Ambulatory Visit (INDEPENDENT_AMBULATORY_CARE_PROVIDER_SITE_OTHER): Payer: Medicare Other | Admitting: INTERNAL MEDICINE CARDIOVASCULAR DISEASE

## 2016-03-14 VITALS — BP 100/64 | HR 79 | Resp 16 | Ht 72.0 in | Wt 202.0 lb

## 2016-03-14 DIAGNOSIS — I447 Left bundle-branch block, unspecified: Secondary | ICD-10-CM

## 2016-03-14 DIAGNOSIS — I739 Peripheral vascular disease, unspecified: Secondary | ICD-10-CM

## 2016-03-14 DIAGNOSIS — R0600 Dyspnea, unspecified: Principal | ICD-10-CM

## 2016-03-14 DIAGNOSIS — Z95810 Presence of automatic (implantable) cardiac defibrillator: Secondary | ICD-10-CM

## 2016-03-14 DIAGNOSIS — I4819 Other persistent atrial fibrillation: Secondary | ICD-10-CM

## 2016-03-14 DIAGNOSIS — Z79899 Other long term (current) drug therapy: Secondary | ICD-10-CM

## 2016-03-14 DIAGNOSIS — I481 Persistent atrial fibrillation: Secondary | ICD-10-CM

## 2016-03-14 DIAGNOSIS — I255 Ischemic cardiomyopathy: Secondary | ICD-10-CM

## 2016-03-14 NOTE — Progress Notes (Signed)
[place]  [address]  [place] [address]  Phone: [phone]  Fax: [phone]    Encounter Date: 03/14/2016    Patient ID:  [name]  PXT:[id number]    DOB:   Age: 75 y.o. male    Subjective:     Chief Complaint   Patient presents with    Hospital Follow Up     HPI   Mr. 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 He presented to [facility] in August with c/o wheezing and exercise intolerance after his recent discharge from a hospital in [facility] for pneumonia. 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 His clot occurred while taking Xarelto 15 mg daily, so his medication was switched to Eliquis 5 mg BID. At that time he had good rate control. [facility] for acute on chronic systolic CHF exacerbation in the setting of atrial fibrillation with RVR. His medications were titrated and he had improvement in rate control with persistent Afib. His Lasix was increased to '40mg'$  twice a day and he did well. Today, he reports he feels a lot better. He hasn't had any weight gain, lower extremity edema, orthopnea or PND. He does admit to feeling fatigued and can tell he's still in atrial fibrillation. He's not had any bleeding complications with the Eliquis and now wants all of his medications sent [facility] [place] He hasn't had any fever, chills, cough, chest pain, abdominal pain, syncope or presyncope. No TIA or stroke-like symptoms.      Current Outpatient Prescriptions   Medication Sig    amiodarone (PACERONE) 200 mg Oral Tablet Take 1 Tab  (200 mg total) by mouth Once a day    apixaban (ELIQUIS) 5 mg Oral Tablet Take 5 mg by mouth Twice daily    carvedilol (COREG) 12.5 mg Oral Tablet Take 12.5 mg by mouth Twice daily with food    cholecalciferol, vitamin D3, 1,000 unit Oral Tablet Take 5,000 Units by mouth Once a day     Coenzyme Q10 (CO Q-10) 100 mg Oral Capsule Take by mouth Once a day     cyanocobalamin (VITAMIN B12) 1,000 mcg/mL Injection Solution 1,000 mcg by Subcutaneous route Every 30 days    digoxin (LANOXIN) 125 mcg Oral Tablet Take 0.125 mg by mouth Once a day    folic acid (FOLVITE) 1 mg Oral Tablet Take 1 mg by mouth Once a day    furosemide (LASIX) 40 mg Oral Tablet Take 40 mg by mouth Twice daily    glipiZIDE (GLUCOTROL) 5 mg Oral Tablet Take 10 mg by mouth Twice a day before meals Take 30 minutes before meals     Iron, Carbonyl 45 mg Oral Tablet Take by mouth Once a day    magnesium citrate 100 mg Oral Tablet Take by mouth Once a day     metFORMIN (GLUCOPHAGE) 500 mg Oral Tablet Take 500 mg by mouth Twice daily with food    nitroglycerin (NITROSTAT) 0.4 mg Sublingual Tablet, Sublingual 1 Tab (0.4 mg total) by Sublingual route Every 5 minutes as  needed for Chest pain. for 3 doses over 15 minutes    Omega-3 Fatty Acids-Vitamin E (FISH OIL) 1,000 mg Oral Capsule Take 1,000 mg by mouth Once a day    ranitidine (ZANTAC) 150 mg Oral Tablet Take 150 mg by mouth Once a day     VIT A/VIT C/VIT E/ZINC/COPPER (ICAPS AREDS ORAL) Take by mouth Twice daily     Allergies   Allergen Reactions    Lipitor [Atorvastatin]      Concerned about cognitive function.    Pravastatin Rash     Past Medical History:   Diagnosis Date    BPH (benign prostatic hypertrophy)     CAD (coronary artery disease) 09/25/2011    CHF (NYHA class II, ACC/AHA stage C) ([place]) 10/24/2013    CVA (cerebrovascular accident) ([place])     Diabetes mellitus ([place])     HTN (hypertension)     Renal stones     Wears dentures     Wears glasses          Past Surgical  History:   Procedure Laterality Date    CORONARY ARTERY ANGIOPLASTY      HX ADENOIDECTOMY      HX APPENDECTOMY      HX HEART CATHETERIZATION      HX LITHOTRIPSY      HX PACEMAKER DEFIBRILLATOR PLACEMENT      HX TONSILLECTOMY           Family History   Problem Relation Age of Onset    Coronary Artery Disease Other      Grandmother    Diabetes Mother     Hypertension Father     High Cholesterol Father     Diabetes Sister     Cancer Sister          Social History   Substance Use Topics    Smoking status: Never Smoker    Smokeless tobacco: Never Used    Alcohol use No       Review of Systems   Constitutional: Positive for fatigue. Negative for activity change, appetite change, chills, fever and unexpected weight change.   HENT: Negative for hearing loss.    Respiratory: Positive for shortness of breath. Negative for apnea, cough, chest tightness and wheezing.    Cardiovascular: Negative for chest pain, palpitations and leg swelling.   Gastrointestinal: Negative for abdominal pain and blood in stool.   Genitourinary: Negative for hematuria.   Musculoskeletal: Positive for arthralgias. Negative for myalgias.   Neurological: Negative for dizziness, tremors, seizures, syncope, facial asymmetry, speech difficulty, light-headedness and headaches.        Memory loss     Psychiatric/Behavioral: Negative for sleep disturbance.     Objective:   Vitals: BP 100/64   Pulse 79   Resp 16   Ht 1.829 m (6')   Wt 91.6 kg (202 lb)   SpO2 96%   BMI 27.4 kg/m2    Physical Exam   Constitutional: He is oriented to person, place, and time. He appears well-developed.   Neck: Normal range of motion. Neck supple. No JVD present.   Cardiovascular: Normal rate, S1 normal, S2 normal and normal pulses.  An irregularly irregular rhythm present. Exam reveals no gallop and no friction rub.    No murmur heard.  Pulmonary/Chest: Effort normal and breath sounds normal. No respiratory distress. He has no wheezes. He has no rales. He exhibits  no tenderness.   Abdominal: Soft. Bowel sounds are normal. There is no tenderness.  Musculoskeletal: Normal range of motion. He exhibits no edema or tenderness.   Neurological: He is alert and oriented to person, place, and time.   Psychiatric: He has a normal mood and affect. His behavior is normal.   Nursing note and vitals reviewed.    Blood work 03/10/16- Na 135, K 3.7, BUN 30, Creat 1.7 and eGFR 40.   HgbA1c 7.4.   Digoxin 1.0.  Hgb 12.6, HCT 39.5, PLT 127.    Assessment & Plan:     ENCOUNTER DIAGNOSES     ICD-10-CM   1. Dyspnea: 6 minute walk test to be done as well as PFTs to check his DLCO with being on Amiodarone for a few years. He is scheduled for repeat blood work with his PCP. Will make sure he has a TSH level rechecked. R06.00   2. Persistent atrial fibrillation ([place]): Rate controlled. Now taking Amiodarone 200 mg daily, Coreg 12.31m BID, Digoxin 1211m daily and Eliquis 5 mg BID. Would like his apical thrombus to resolve before attempting cardioversion. May also consider AV nodal ablation since he already has a permanent biventricular ICD in place. I48.1   3. On amiodarone therapy Z79.899   4. Ischemic cardiomyopathy: Stable. Appears euvolemic. Blood work showed worsened renal function. Will reduce Lasix back down to 4081maily and take an extra tablet as needed for worsened SOB or weight gain of 3-5 lbs from one morning to the next. I25.5   5. LBBB (left bundle branch block): Chronic. I44.7   6. Biventricular ICD (implantable cardioverter-defibrillator) in place: Normal device function. He's been in atrial fibrillation since 02/19/16. Thoracic impedence is improved. His battery longevity is 5.4 years. No VT or shocks. Z95.810   7. PAD (peripheral artery disease) [place] showed probable bilateral popliteal-tibial occlusive disease and abnormal toe pressures at rest indicating poor wound healing potential for ulcers. Take low dose ASA in addition to his anticoagulation due to CAD with PCI and  PAD. I73.9       Orders Placed This Encounter    THYROID STIMULATING HORMONE (SENSITIVE TSH)    SIX MINUTE WALK TEST (6MWT)    PULMONARY FUNCTION TESTING-ADULT       Return in about 2 weeks (around 03/28/2016).    AshSamuella BruinA-C    Due to AFib RVR he is now on amiodarone may need EP eval for device drug interaction   He will follow with Dr SidBeverley Fiedlerd discuss AV node ablation    Late entry for 03/14/2016. I personally saw and evaluated the patient. See mid-level's note for additional details. My findings/participation are AFib and PAD. If he is noted to have claudication when he goes for his six minute walk he may need further invasive workup    AbbLeo GrosserD

## 2016-03-28 ENCOUNTER — Other Ambulatory Visit (INDEPENDENT_AMBULATORY_CARE_PROVIDER_SITE_OTHER): Payer: Self-pay

## 2016-03-28 ENCOUNTER — Encounter (INDEPENDENT_AMBULATORY_CARE_PROVIDER_SITE_OTHER): Payer: Medicare Other | Admitting: Physician Assistant

## 2016-03-28 DIAGNOSIS — Z79899 Other long term (current) drug therapy: Secondary | ICD-10-CM

## 2016-03-28 DIAGNOSIS — R0600 Dyspnea, unspecified: Secondary | ICD-10-CM

## 2016-03-31 ENCOUNTER — Other Ambulatory Visit (INDEPENDENT_AMBULATORY_CARE_PROVIDER_SITE_OTHER): Payer: Self-pay

## 2016-04-02 ENCOUNTER — Encounter (INDEPENDENT_AMBULATORY_CARE_PROVIDER_SITE_OTHER): Payer: Self-pay | Admitting: Family

## 2016-04-02 LAB — ENTER/EDIT NEPHROLOGY EXTERNAL LABS
25-HYDROXY VITAMIN D: 68.3
ALKALINE PHOSPHATASE: 74
ALT (SGPT): 17
ANION GAP: 9
AST (SGOT): 18
BILIRUBIN, TOTAL: 0.7
BUN: 27 — ABNORMAL HIGH
CALCIUM: 9
CARBON DIOXIDE: 24
CARBON DIOXIDE: 24
CHLORIDE: 109 — ABNORMAL HIGH
CHOLESTEROL: 186
CREATININE: 1.9 — ABNORMAL HIGH
ESTIMATED GLOMERULAR FILTRATION RATE: 35
FERRITIN: 91
GLUCOSE, FASTING: 135 — ABNORMAL HIGH
HCT: 40.4 — ABNORMAL LOW
HDL-CHOLESTEROL: 35
HDL-CHOLESTEROL: 35
HEMOGLOBIN A1C: 6.6 — ABNORMAL HIGH
HGB: 13 — ABNORMAL LOW
INTACT PTH: 140 — ABNORMAL HIGH
IRON BINDING CAPACITY: 403
LDL CHOLESTEROL,DIRECT: 125
MCH: 29.3
MCHC: 32.2
MCV: 91.2
MPV: 11.2 — ABNORMAL HIGH
PHOSPHORUS: 3.3
PLATELET COUNT: 188
POTASSIUM: 4
RBC: 4.43 — ABNORMAL LOW
RDW: 14.1
SODIUM: 142
TOTAL PROTEIN: 5.9
TRIGLYCERIDES: 133
WBC: 6.4

## 2016-04-03 ENCOUNTER — Telehealth (INDEPENDENT_AMBULATORY_CARE_PROVIDER_SITE_OTHER): Payer: Self-pay | Admitting: Internal Medicine

## 2016-04-03 NOTE — Telephone Encounter (Signed)
Spoke to Mr [name] and his Sister, advised to stop amiodarone due to abnormal lung diffusion per preliminary report, to discuss the options with Dr Lucinda Dell, I think may be to do TEE guided cadrioversion around early Nov or late October if no longer LV clot and then admit him for Tikosyn therapy or AV node ablation. He did very well in sinus rhythm with his BIV.

## 2016-04-08 ENCOUNTER — Ambulatory Visit (INDEPENDENT_AMBULATORY_CARE_PROVIDER_SITE_OTHER): Payer: Medicare Other | Admitting: Family

## 2016-04-08 VITALS — BP 122/90 | HR 80 | Resp 16 | Ht 72.0 in | Wt 203.0 lb

## 2016-04-08 DIAGNOSIS — N183 Chronic kidney disease, stage 3 (moderate): Secondary | ICD-10-CM

## 2016-04-08 DIAGNOSIS — I129 Hypertensive chronic kidney disease with stage 1 through stage 4 chronic kidney disease, or unspecified chronic kidney disease: Secondary | ICD-10-CM

## 2016-04-08 DIAGNOSIS — D649 Anemia, unspecified: Secondary | ICD-10-CM

## 2016-04-08 DIAGNOSIS — I10 Essential (primary) hypertension: Secondary | ICD-10-CM

## 2016-04-08 NOTE — Progress Notes (Signed)
 [facility] Department of Medicine  [address]  [place], [place] [address]      PROGRESS NOTE    PATIENT NAME: [name], [name]  CHART NUMBER: [id number]  DATE OF BIRTH:   DATE OF SERVICE:  04/08/2016     IDENTIFICATION:  This is a 75 y.o. Caucasian male who is being followed in the [facility] for chronic kidney disease stage III with baseline creatinines ranging 1.7-1.9 for the past year, most likely multifactorial in etiology with uncontrolled diabetes, hypertension, atherosclerosis and heart failure.  I last saw him on January 01, 2016 and at that time his most recent creatinine level was 1.9.  He is here today in scheduled followup.    SUBJECTIVE:  The patient presents to clinic today accompanied by his sister.  She tells me that he has had a very eventful last couple months.  He has been hospitalized 3 times for Afib, pneumonia, and COPD exacerbation.  Medication adjustments have been made and he may undergo cardioversion later this week.    He denies current chest pain or shortness of breath.  No lower extremity edema currently, he did in the past before and his pacemaker was placed.  Appetite is good.  Has changed his diet and is eating healthier.  No nausea or vomiting.  No fever or chills.   He denies dysuria or hematuria.  He feels that urine output is adequate with fluid intake.  Home BPs range in the 120's/70s.  Feels that energy level is down and he is not as active as he was before these recent hospitalizations.    MEDICATIONS:   Current Outpatient Prescriptions   Medication Sig    apixaban (ELIQUIS) 5 mg Oral Tablet Take 5 mg by mouth Twice daily    aspirin (ECOTRIN) 81 mg Oral Tablet, Delayed Release (E.C.) Take 81 mg by mouth Once a day    carvedilol (COREG) 12.5 mg Oral Tablet Take 12.5 mg by mouth Twice daily with food    cholecalciferol, vitamin D3, 1,000 unit Oral Tablet Take 5,000 Units by mouth Once a day     Coenzyme Q10 (CO Q-10) 100 mg Oral Capsule Take  by mouth Once a day     digoxin (LANOXIN) 125 mcg Oral Tablet Take 0.125 mg by mouth Once a day    folic acid (FOLVITE) 1 mg Oral Tablet Take 1 mg by mouth Once a day    furosemide (LASIX) 40 mg Oral Tablet Take 40 mg by mouth Twice daily    glipiZIDE (GLUCOTROL) 5 mg Oral Tablet Take 10 mg by mouth Twice a day before meals Take 30 minutes before meals     Iron, Carbonyl 45 mg Oral Tablet Take by mouth Once a day    metFORMIN (GLUCOPHAGE) 500 mg Oral Tablet Take 500 mg by mouth Twice daily with food    nitroglycerin (NITROSTAT) 0.4 mg Sublingual Tablet, Sublingual 1 Tab (0.4 mg total) by Sublingual route Every 5 minutes as needed for Chest pain. for 3 doses over 15 minutes    Omega-3 Fatty Acids-Vitamin E (FISH OIL) 1,000 mg Oral Capsule Take 1,000 mg by mouth Once a day    ranitidine (ZANTAC) 150 mg Oral Tablet Take 150 mg by mouth Once a day     VIT A/VIT C/VIT E/ZINC/COPPER (ICAPS AREDS ORAL) Take by mouth Twice daily       OBJECTIVE:  Mr. Siah Steely is a 75 y.o.  Caucasian male who presents to the [facility] today accompanied by his sister.  He is ambulatory without difficulty and without the use of assistive devices.  He is in no acute distress, appears his stated age.  He is very pleasant and cooperative.      Vitals:    04/08/16 1132   BP: 122/90   Pulse: 80   Resp: 16   Weight: 92.1 kg (203 lb)   Height: 1.829 m (6')     HEENT:  Head is normocephalic and atraumatic.  Eyes are anicteric.  Mucous membranes moist.  Cardiac:  Normal S1, S2, regular rate and rhythm.  +Pacemaker.  Pulmonary:  Lungs are clear.  He displays good respiratory effort.  Abdomen:  Soft, nontender.  Skin:  Warm, dry and intact.  Extremities:  No lower extremity edema.  Psych:  Alert and oriented with normal mood and affect and appropriate response.  Neurologic:  No tremors or asterixis.    LABORATORY DATA:    Lab Results   Component Value Date    BUN 27 (H) 04/01/2016    BUN 25 12/25/2015    BUN 29 09/19/2015     CREATININE 1.9 (H) 04/01/2016    CREATININE 1.9 12/25/2015    CREATININE 1.8 09/19/2015    BUNCRRATIO 14.21 04/01/2016    BUNCRRATIO 13.16 12/25/2015    BUNCRRATIO 16.11 09/19/2015    GFR 35 04/01/2016    GFR 35 12/25/2015    GFR 37 09/19/2015    SODIUM 142 04/01/2016    SODIUM 140 12/25/2015    SODIUM 137 09/19/2015    POTASSIUM 4.0 04/01/2016    POTASSIUM 4.0 12/25/2015    POTASSIUM 4.4 09/19/2015    CHLORIDE 109 (H) 04/01/2016    CHLORIDE 108 12/25/2015    CHLORIDE 102 09/19/2015    CO2 24 04/01/2016    CO2 20 12/25/2015    CO2 23 09/19/2015    ANIONGAP 9 04/01/2016    ANIONGAP 10 07/04/2014    ANIONGAP 9 01/10/2014    CALCIUM 9.0 04/01/2016    CALCIUM 9.4 12/25/2015    CALCIUM 9.1 09/19/2015    PHOSPHORUS 3.3 04/01/2016    PHOSPHORUS 4.2 11/28/2014    PHOSPHORUS 2.6 08/21/2014    ALBUMIN 3.9 07/04/2014    HGB 13 (L) 04/01/2016    HGB 13.5 12/25/2015    HGB 13.7 09/19/2015    HCT 40.4 (L) 04/01/2016    HCT 40.2 12/25/2015    HCT 40.8 09/19/2015    INTACTPTH 140 (H) 04/01/2016    INTACTPTH 81.4 11/28/2014    INTACTPTH 132.4 (H) 09/05/2014    IRON 61 04/01/2016    IRON 75 12/25/2015    IRON 122 09/19/2015    IRONBINDCAP 403 04/01/2016    IRONBINDCAP 384 12/25/2015    IRONBINDCAP 409 09/19/2015    IRONSAT 30 09/19/2015    FERRITIN 91 04/01/2016    FERRITIN 58 12/25/2015    FERRITIN 96 09/19/2015    [id number] 49.0 07/04/2014    HA1C 6.6 (H) 04/01/2016    HA1C 9.2 12/25/2015    HA1C 8.8 09/19/2015         RADIOLOGY DATA:  [facility] March 23, 2014, indicates right kidney at 9.3 cm with no evidence of hydronephrosis, renal calculi or renal cortical thinning.  Left kidney 12.4 cm and it demonstrates flow with no evidence of hydronephrosis, renal calculi or renal cortical thinning.  There is a calculus in the posterior aspect of the bladder measuring 1.6 cm x 2 cm.  ASSESSMENT/PLAN:  1.  Chronic kidney disease stage III with baseline creatinines ranging 1.7-1.9 and most likely  etiology multifactorial in etiology with uncontrolled diabetes, hypertension and atherosclerosis and heart failure.  His most recent creatinine level indicates stability at 1.9.  We will continue to monitor with quarterly labs and clinic visits.  I did remind him on the importance of complete avoidance of NSAIDs and IV contrast dyes if at all possible and he verbalized understanding.  2.  Lower extremity edema, much improved and minimal. Continue lasix 40 mg twice daily.    3.  Hypertension.  Blood pressure adequately controlled.  No changes in medications to be made at this time.  4.  Diabetes mellitus, much improved.   Previous A1c above goal at 9.2 and most recently at 6.6.  I commended him on this great improvement.    He will continue to follow with his primary care provider for management of diabetes with goal A1c less than 7%.   He is currently on metformin.  If his GFR were to decrease ot <30 this would need to be stopped due to risk of metabolic acidosis.  His most recent urine test was negative for protein.   Ordered to be checked again with next set of labs. He was on an ARB which was recently dc'd.  Not sure why.  5.  Disposition.  He will return to the [facility] in 3 months for reevaluation of above-mentioned conditions.  He will have lab work completed in the interim and will be seen sooner, if indicated.    The patient was seen independently.      Candie Chroman, ANP  Section of Nephrology  [facility] Department of Medicine

## 2016-04-09 ENCOUNTER — Ambulatory Visit (INDEPENDENT_AMBULATORY_CARE_PROVIDER_SITE_OTHER): Payer: Medicare Other | Admitting: Internal Medicine

## 2016-04-09 VITALS — BP 118/68 | HR 75 | Resp 16 | Ht 72.0 in | Wt 199.0 lb

## 2016-04-09 DIAGNOSIS — I481 Persistent atrial fibrillation: Secondary | ICD-10-CM

## 2016-04-09 DIAGNOSIS — I739 Peripheral vascular disease, unspecified: Secondary | ICD-10-CM

## 2016-04-09 DIAGNOSIS — Z95810 Presence of automatic (implantable) cardiac defibrillator: Secondary | ICD-10-CM

## 2016-04-09 DIAGNOSIS — I4819 Other persistent atrial fibrillation: Secondary | ICD-10-CM

## 2016-04-09 DIAGNOSIS — I447 Left bundle-branch block, unspecified: Secondary | ICD-10-CM

## 2016-04-09 DIAGNOSIS — I255 Ischemic cardiomyopathy: Secondary | ICD-10-CM

## 2016-04-09 NOTE — Procedures (Signed)
Programming/analysis was performed of patient's multi-lead permanent   cardiac defibrillator, typically including analysis of programmed parameters,   lead(s), battery, capture and sensing function(s) and heart rhythm,   including sensor rate response (if applicable), lower and upper heart   rates, AV intervals, pacing voltage and pulse duration, sensing, and   diagnostic parameters, as applicable. Programming adjustments may have   been made.  Please see programmer printout for details (scanned document).      SJM Quadra Assura [id number] CRT-D  Battery longevity 4.3-5.1 years  Mode DDD 70bpm-120bpm  Pacing: AP <1%, BP 50%  Impedance: RA 400, RV 350, LV 590  Sensing: Pwaves 2.19mV, R waves 7.35mV  Threshold: RA not performed (afib), RV 0.75V@0 .26ms, LV 1.0V@0 .31ms    Events: Mode Switch 93% of the time with AT/AF Burden 99%.  Persistent Afib since 02/19/16.    Patient will be scheduled for AVN ablation with Dr. Lucinda Dell.    Ashok Norris, PA-C  04/09/2016, 16:39      PACEMAKER PROGRAMMING EVALUATION    Order:  See patient's medical record.    Procedure Description:  Standard programming evaluation using pacemaker programmer.    Data recording:  See printouts, where available, for details.    Physician Analysis and Reporting:  Unremarkable device analysis.  No major abnormalities, except as otherwise noted, if applicable.    Plan:  Plan routine follow-up, as appropriate, or unless otherwise indicated.  Please see notes and/or scanned printouts (where available) for details.    Hazle Quant, MD  Cardiac Electrophysiology  [facility]

## 2016-04-09 NOTE — Progress Notes (Signed)
[facility]  [address]  [place] [address]  Phone: [phone]  Fax: [phone]    Encounter Date: 04/09/2016    Patient ID:  [name]  BTY:[id number]    DOB:   Age: 75 y.o. 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 He presented to [facility] in August with c/o wheezing and exercise intolerance after his recent discharge from a hospital in [facility] for pneumonia. 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 His clot occurred while taking Xarelto 15 mg daily, so his medication was switched to Eliquis 5 mg BID. At that time he had good rate control. [facility] on chronic systolic CHF exacerbation in the setting of atrial fibrillation with RVR. His medications were titrated and he had improvement in rate control with persistent Afib. At his last visit, he was scheduled for PFTs, a six minute walk test and for a TSH level due to chronic Amiodarone therapy. His PFTs revealed severe airway obstruction and reduced DLCO. He was able to complete his 6 minute walk and did not qualify for home O2. Due to his results, Amiodarone therapy was discontinued. Today, he reports feeling better but admits to feeling weak since July. His heart rates have been fairly well controlled, but he remains in atrial fibrillation and is  currently on Eliquis 5 mg BID without any bleeding complication. He resides with his family due to cognitive impairment. He hasn't had any weight gain, lower extremity edema, orthopnea or PND. He hasn't had any fever, chills, cough, chest pain, abdominal pain, syncope or presyncope. No TIA or stroke-like symptoms.    Current Outpatient Prescriptions   Medication Sig    albuterol sulfate (PROVENTIL OR VENTOLIN OR PROAIR) 90 mcg/actuation Inhalation HFA Aerosol Inhaler Take 1-2 Puffs by inhalation Every 6 hours as needed    apixaban (ELIQUIS) 5 mg Oral Tablet Take 5 mg by mouth Twice daily    aspirin (ECOTRIN) 81 mg Oral Tablet, Delayed Release (E.C.) Take 81 mg by mouth Once a day    carvedilol (COREG) 12.5 mg Oral Tablet Take 12.5 mg by mouth Twice daily with food    cholecalciferol, vitamin D3, 1,000 unit Oral Tablet Take 5,000 Units by mouth Once a day     Coenzyme Q10 (CO Q-10) 100 mg Oral Capsule Take by mouth Once a day     cyanocobalamin (VITAMIN B12) 1,000 mcg/mL Injection Solution 1,000 mcg by Subcutaneous route Every 30 days    digoxin (LANOXIN) 125 mcg Oral Tablet Take 0.125 mg by mouth Once a day    folic acid (FOLVITE) 1 mg Oral Tablet Take 1 mg by mouth Once a day    furosemide (LASIX) 40 mg Oral Tablet Take 40 mg by mouth Once a day     glipiZIDE (GLUCOTROL) 5  mg Oral Tablet Take 10 mg by mouth Twice a day before meals Take 30 minutes before meals     Iron, Carbonyl 45 mg Oral Tablet Take by mouth Once a day    metFORMIN (GLUCOPHAGE) 500 mg Oral Tablet Take 500 mg by mouth Twice daily with food    nitroglycerin (NITROSTAT) 0.4 mg Sublingual Tablet, Sublingual 1 Tab (0.4 mg total) by Sublingual route Every 5 minutes as needed for Chest pain. for 3 doses over 15 minutes    Omega-3 Fatty Acids-Vitamin E (FISH OIL) 1,000 mg Oral Capsule Take 1,000 mg by mouth Once a day    ranitidine (ZANTAC) 150 mg Oral Tablet Take 150 mg by mouth Once a day     VIT A/VIT C/VIT E/ZINC/COPPER (ICAPS AREDS  ORAL) Take by mouth Twice daily     Allergies   Allergen Reactions    Lipitor [Atorvastatin]      Concerned about cognitive function.    Pravastatin Rash     Past Medical History:   Diagnosis Date    BPH (benign prostatic hypertrophy)     CAD (coronary artery disease) 09/25/2011    CHF (NYHA class II, ACC/AHA stage C) ([place]) 10/24/2013    CVA (cerebrovascular accident) ([place])     Diabetes mellitus ([place])     HTN (hypertension)     Renal stones     Wears dentures     Wears glasses          Past Surgical History:   Procedure Laterality Date    CORONARY ARTERY ANGIOPLASTY      HX ADENOIDECTOMY      HX APPENDECTOMY      HX HEART CATHETERIZATION      HX LITHOTRIPSY      HX PACEMAKER DEFIBRILLATOR PLACEMENT      HX TONSILLECTOMY           Family Medical History     Problem Relation (Age of Onset)    Cancer Sister    Coronary Artery Disease Other    Diabetes Mother, Sister    High Cholesterol Father    Hypertension Father            Social History   Substance Use Topics    Smoking status: Never Smoker    Smokeless tobacco: Never Used    Alcohol use No       Review of Systems   Constitutional: Positive for fatigue. Negative for activity change, appetite change, chills, fever and unexpected weight change.   HENT: Negative for hearing loss.    Respiratory: Positive for shortness of breath. Negative for apnea, cough, chest tightness and wheezing.    Cardiovascular: Negative for chest pain, palpitations and leg swelling.   Gastrointestinal: Negative for abdominal pain and blood in stool.   Genitourinary: Negative for hematuria.   Musculoskeletal: Positive for arthralgias. Negative for myalgias.   Neurological: Positive for weakness. Negative for dizziness, tremors, seizures, syncope, facial asymmetry, speech difficulty, light-headedness and headaches.        Memory loss   Psychiatric/Behavioral: Negative for sleep disturbance.     Objective:   Vitals: BP 118/68   Pulse 75   Resp 16   Ht 1.829 m (6')   Wt 90.3 kg  (199 lb)   SpO2 98%   BMI 26.99 kg/m2    Physical Exam   Constitutional: He is oriented to person, place, and time. He appears well-developed.   Neck: Normal range of motion. Neck supple. No JVD present.   Cardiovascular:  Normal rate, S1 normal, S2 normal and normal pulses.  An irregularly irregular rhythm present. Exam reveals no gallop and no friction rub.    No murmur heard.  Pulmonary/Chest: Effort normal and breath sounds normal. No respiratory distress. He has no wheezes. He has no rales. He exhibits no tenderness.   Abdominal: Soft. Bowel sounds are normal. There is no tenderness.   Musculoskeletal: Normal range of motion. He exhibits no edema or tenderness.   Neurological: He is alert and oriented to person, place, and time.   Psychiatric: He has a normal mood and affect. His behavior is normal.   Nursing note and vitals reviewed.    Blood work 03/10/16- Na 135, K 3.7, BUN 30, Creat 1.7 and eGFR 40.  HgbA1c 7.4  Digoxin level 1.0  Hgb 12.6, HCT 39.5 and PLT 127.    Assessment & Plan:     ENCOUNTER DIAGNOSES     ICD-10-CM   1. Persistent atrial fibrillation ([place]): Rate controlled. He remained in Afib despite being on Amiodarone therapy. Amiodarone had to be discontinued due to reduced DLCO. Due to his cognitive impairment and CKD, he's not a good candidate for Tikosyn. Other medications are also contraindicated due to his reduced EF. Discussed AVN ablation with the patient and his sister in detail. He already has a BiV ICD in place. Patient is agreeable. He'll be notified of a date and time for his procedure at [facility]. I48.1   2. Ischemic cardiomyopathy: Stable. Appears euvolemic. On Lasix 42m daily with increased dose as needed. I25.5   3. LBBB (left bundle branch block): chronic. I44.7   4. Biventricular ICD (implantable cardioverter-defibrillator) in place: Normal device function. He's been in atrial fibrillation since 02/19/16. No VT or shocks. Afib is overriding device function, and he's only biventricular  paced 50% of the time. This may be the reason he feels bad. Z95.810   5. PAD (peripheral artery disease) [place]: ABI's 01/17/16 showed probable bilateral popliteal-tibial occlusive disease and abnormal toe pressures at rest indicating poor wound healing potential for ulcers. [facility]9       Orders Placed This Encounter    PROG/ANALYSIS-MULTI LEAD ICD (AMB ONLY)       Return if symptoms worsen or fail to improve.    ASamuella Bruin PA-C      I personally saw and evaluated the patient on 04/09/16. See mid-level's note for additional details.  Persistent atrial fibrillation with ischemic cardiomyopathy s/p biventricular ICD.  Agree with stopping amiodarone in light of his recent PFTs.  Had recurrent persistent AF on amiodarone, so not sure that other antiarrhythmics would be expected to be effective, but might be able to take Multaq.  Tikosyn or sotalol could be considered, however not ideal given CKD and memory problems.  AV node ablation would restore full biventricular pacing (now 50%), which could make him feel better again, but no guarantee.  After discussion of these options, he will proceed with AV node ablation.  This will be scheduled at the next available time.  If he doesn't feels better after AV node ablation, rhythm control options could be reconsidered.    DRicki Miller MD

## 2016-04-10 DIAGNOSIS — I4819 Other persistent atrial fibrillation: Secondary | ICD-10-CM | POA: Insufficient documentation

## 2016-04-10 NOTE — Addendum Note (Signed)
Addended by: Hazle Quant on: 04/10/2016 09:00 AM     Modules accepted: Orders

## 2016-04-11 ENCOUNTER — Telehealth (HOSPITAL_COMMUNITY): Payer: Self-pay | Admitting: Internal Medicine

## 2016-04-11 NOTE — Telephone Encounter (Signed)
See patient instructions

## 2016-04-11 NOTE — Patient Instructions (Signed)
Received order for AV Node Ablation with Dr. Lucinda Dell. Spoke with pt and he asked that I speak to his sister [name], scheduled procedure, and gave instructions. Patient is scheduled for procedure on 10/31/7. [facility] are to [facility] at the [facility] on [address] on the 2nd floor to register. They will be notified the day before with instructions on time of arrival and where to go to register. NPO after MN, May take am meds with sip of water, holding am dose of metformin and Glipizide. He will need someone with him to drive him home. [name] verbalizes understanding of all instructions given and denies any questions or concerns at this time. Please call [facility] phone at [phone] for arrival time or can leave a message.  Levan Hurst RN

## 2016-04-15 ENCOUNTER — Ambulatory Visit (INDEPENDENT_AMBULATORY_CARE_PROVIDER_SITE_OTHER): Payer: Medicare Other

## 2016-04-15 VITALS — BP 142/82 | HR 73 | Resp 18 | Ht 72.0 in | Wt 197.0 lb

## 2016-04-15 DIAGNOSIS — I10 Essential (primary) hypertension: Secondary | ICD-10-CM

## 2016-04-15 DIAGNOSIS — I255 Ischemic cardiomyopathy: Secondary | ICD-10-CM

## 2016-04-15 DIAGNOSIS — Z95810 Presence of automatic (implantable) cardiac defibrillator: Secondary | ICD-10-CM

## 2016-04-15 DIAGNOSIS — I447 Left bundle-branch block, unspecified: Secondary | ICD-10-CM

## 2016-04-15 DIAGNOSIS — I480 Paroxysmal atrial fibrillation: Secondary | ICD-10-CM

## 2016-04-15 DIAGNOSIS — E119 Type 2 diabetes mellitus without complications: Secondary | ICD-10-CM

## 2016-04-15 DIAGNOSIS — N189 Chronic kidney disease, unspecified: Secondary | ICD-10-CM

## 2016-04-15 DIAGNOSIS — R4182 Altered mental status, unspecified: Secondary | ICD-10-CM

## 2016-04-15 NOTE — Progress Notes (Signed)
OV dictated    Rosalita Chessman L. Manson Passey, PA-C      I have seen and evaluated the patient jointly with Herbie Saxon PA-C and agree with her assessment, recommendations and plans.      Vincent Gros, MD 04/20/2016 13:18

## 2016-04-16 NOTE — Progress Notes (Signed)
PATIENT NAME: [name], [name]  HOSPITAL NUMBER:  [id number]  DATE OF SERVICE: 04/15/2016  DATE OF BIRTH:      PROGRESS NOTE    SUBJECTIVE:  A 75 year old white male that is followed in the clinic with a history of coronary artery disease with stenting of his LAD as well as RCA in 2013.  His initial EF was 40-50%; however, dropped to 20-25%.  He had a biventricular ICD implant, history of paroxysmal atrial fibrillation as well as left bundle branch block.  His risk factors include diabetes mellitus, noninsulin dependent, hypertension, hyperlipidemia been with 3 hospitalizations during August of this year treated for congestive heart failure with atrial fibrillation, RVR, as well as underlying pneumonia.  He developed mural thrombus while on Xarelto therapy and has been switched to Eliquis.  He has also had his Amiodarone discontinued with pulmonary function tests showing reduced DLCO.  He has been evaluated by Dr. Lucinda Dell and scheduled for AV node ablation on May 13, 2016.  Both he and his sister again have questions concerning the procedure and it was discussed in detail again with Dr. Dellia Beckwith present and they voice understanding.  Since being home, he has had no complaints of chest pain, shortness of breath at rest or dyspnea upon exertion.  He does have chronic fatigue sometimes spending days on end in bed.  There has been no overt heart failure.  No orthopnea, PND or peripheral edema.  His memory and cognition seem a bit worse today.  He relies on his sister for prompting.    ALLERGIES:  Lipitor and Pravastatin.    MEDICATIONS:  1. Proventil inhaler.  2. Eliquis 5 mg p.o. b.i.d.  3. Aspirin 81 mg p.o. daily.  4. Coreg 12.5 mg p.o. b.i.d.  5. Vitamin D3 5000 units daily.  6. CoQ10 100 mg daily.  7. Vitamin B12 1000 mcg subcu q.30 days.  8. Lanoxin 125 mcg daily.  9. Folvite 1 mg daily.  10. Lasix 40 mg daily.  11. Glucotrol 10 mg b.i.d.  12. Iron 45 mg daily.  13. Cozaar 25 mg 1/2 tablet p.o.  daily.  14. Glucophage 500 mg b.i.d.  15. Fish oil 1000 mg daily.  16. Zantac 150 mg daily.  17. Multivitamin daily.    REVIEW OF SYSTEMS:  No fever or chills.  No headache, syncope or near syncopal event.  Denies chest pain, shortness of breath at rest or dyspnea upon exertion.  No orthopnea or PND.  No palpitations.  No cough or hemoptysis.  No abdominal pain.  No nausea, vomiting, diarrhea, or change in either bowel or bladder habits.  No melena or tarry stool.  No edema nor known weight change.  He is with chronic fatigue.    PHYSICAL EXAMINATION:  Blood pressure 142/82, pulse 73 and regular, respirations 18 even and nonlabored.  Height is 6 feet 0 inch.  Weight 197 pounds.  SpO2 is 99% on room air.  On general exam, he is alert, requires prompting by his sister for detailing history.  Normocephalic atraumatic.  Sclerae is clear, nonicteric.  Neck:  Supple without adenopathy, JVD or bruit.  There is no thyromegaly.  Lungs:  Clear to auscultation with equal air entry bilaterally.  Heart: Regular rate without audible murmur, gallop or rub.  Abdomen:  Soft, nontender, nondistended without hepatosplenomegaly or palpable mass.  There are positive bowel sounds throughout.  Extremities: Without clubbing, cyanosis, or edema, as well as symmetrical pulses.    ASSESSMENT:  1. Listed as Persistent  atrial fibrillation.  2. Ischemic cardiomyopathy with a biventricular ICD implant.  3. Left bundle branch block, chronic.  4. Coronary artery disease with stenting of the RCA as well as LAD.  5. Hypertension.  6. Hyperlipidemia.  7. Previous Amiodarone discontinued secondary to reduced DLCO.    PLAN:  1. We will make no medication changes today.   2. We will repeat 2D echo in 2 months and see if he has resolution of the mural thrombus.  3. Continue Eliquis as ordered as well as medications as listed.  4. He has followup appointment with Dr. Lucinda DellSiddoway on May 13, 2016.        Gweneth FritterSuzanne Lee Brown, PA-C  Section of Cardiology   [facility]  Department of Medicine       Vincent Groshalak Shellia Hartl, MD  Assistant Professor   [facility] Department of Medicine, Section of Cardiology            CC:   Verita SchneidersHannah Ariel Valentine, MD   [address]   [place], [place] [id number]       DD:  04/15/2016 15:45:54  DT:  04/16/2016 14:42:09 TP  D#:  [id number]      I have seen and evaluated the patient jointly with Ashok NorrisAshley M Nestor, PA-C and agree with her assessment, recommendations and plans. A fib with BIV in place for CHF, off amiodarone secondary to reduced DLCO, he has been considered for AV node ablation by Dr Hilbert Corrigan[place]daway.          Vincent Groshalak Matthan Sledge, MD 04/20/2016 12:57

## 2016-04-24 ENCOUNTER — Ambulatory Visit (INDEPENDENT_AMBULATORY_CARE_PROVIDER_SITE_OTHER): Payer: Self-pay | Admitting: Internal Medicine

## 2016-04-24 NOTE — Telephone Encounter (Signed)
[name] that Dr. Dellia Beckwith advised to give extra 40mg  of Lasix rather than extra 20 mg of Lasix since the 20 mg is not decreasing the weight.  He will go back to regular dosing once back to original weight.  [name] voiced understanding and is agreeable.  BB, RN

## 2016-04-24 NOTE — Telephone Encounter (Signed)
-----   Message from Ortencia Kick sent at 04/24/2016 10:57 AM EDT -----  Pt's sister [name] called and said that Mithun has been retaining about 6 lbs of extra fluid, He ate some salty food during festival and has been having extra fluid.She said she usually gives him an extra 20 mg. At times when this happens. She wanted to know how much more she can give him? She asked if you would call her at [phone]. Thanks!

## 2016-05-13 ENCOUNTER — Encounter (HOSPITAL_BASED_OUTPATIENT_CLINIC_OR_DEPARTMENT_OTHER): Payer: Medicare Other | Admitting: Internal Medicine

## 2016-05-13 ENCOUNTER — Encounter (HOSPITAL_COMMUNITY)
Admission: RE | Disposition: A | Discharge status: Home / Self Care | Payer: Self-pay | Source: Ambulatory Visit | Attending: Internal Medicine

## 2016-05-13 ENCOUNTER — Encounter (HOSPITAL_COMMUNITY): Payer: Self-pay

## 2016-05-13 ENCOUNTER — Inpatient Hospital Stay
Admission: RE | Admit: 2016-05-13 | Discharge: 2016-05-13 | Disposition: A | Discharge status: Home / Self Care | Payer: Medicare Other | Source: Ambulatory Visit | Attending: Internal Medicine | Admitting: Internal Medicine

## 2016-05-13 DIAGNOSIS — Z7901 Long term (current) use of anticoagulants: Secondary | ICD-10-CM | POA: Insufficient documentation

## 2016-05-13 DIAGNOSIS — I255 Ischemic cardiomyopathy: Secondary | ICD-10-CM | POA: Insufficient documentation

## 2016-05-13 DIAGNOSIS — I10 Essential (primary) hypertension: Secondary | ICD-10-CM | POA: Insufficient documentation

## 2016-05-13 DIAGNOSIS — E785 Hyperlipidemia, unspecified: Secondary | ICD-10-CM | POA: Insufficient documentation

## 2016-05-13 DIAGNOSIS — R9431 Abnormal electrocardiogram [ECG] [EKG]: Secondary | ICD-10-CM

## 2016-05-13 DIAGNOSIS — E119 Type 2 diabetes mellitus without complications: Secondary | ICD-10-CM | POA: Insufficient documentation

## 2016-05-13 DIAGNOSIS — I4891 Unspecified atrial fibrillation: Secondary | ICD-10-CM

## 2016-05-13 DIAGNOSIS — I447 Left bundle-branch block, unspecified: Secondary | ICD-10-CM | POA: Insufficient documentation

## 2016-05-13 DIAGNOSIS — I481 Persistent atrial fibrillation: Secondary | ICD-10-CM | POA: Insufficient documentation

## 2016-05-13 DIAGNOSIS — Z7982 Long term (current) use of aspirin: Secondary | ICD-10-CM | POA: Insufficient documentation

## 2016-05-13 DIAGNOSIS — Z955 Presence of coronary angioplasty implant and graft: Secondary | ICD-10-CM | POA: Insufficient documentation

## 2016-05-13 DIAGNOSIS — I2510 Atherosclerotic heart disease of native coronary artery without angina pectoris: Secondary | ICD-10-CM | POA: Insufficient documentation

## 2016-05-13 DIAGNOSIS — Z95810 Presence of automatic (implantable) cardiac defibrillator: Secondary | ICD-10-CM | POA: Insufficient documentation

## 2016-05-13 DIAGNOSIS — I4819 Other persistent atrial fibrillation: Secondary | ICD-10-CM | POA: Insufficient documentation

## 2016-05-13 HISTORY — DX: Type 2 diabetes mellitus without complications (CMS HCC): E11.9

## 2016-05-13 HISTORY — DX: Acute myocardial infarction, unspecified (CMS HCC): I21.9

## 2016-05-13 LAB — CBC
HCT: 42.8 % (ref 36.7–47.0)
HGB: 14.1 g/dL (ref 12.5–16.3)
MCH: 29.3 pg (ref 27.4–33.0)
MCH: 29.3 pg (ref 27.4–33.0)
MCHC: 33.1 g/dL (ref 32.5–35.8)
MCV: 88.6 fL (ref 78.0–100.0)
MPV: 10.2 fL (ref 7.5–11.5)
PLATELETS: 201 x10ˆ3/uL (ref 140–450)
RBC: 4.83 x10ˆ6/uL (ref 4.06–5.63)
RDW: 15 % (ref 12.0–15.0)
WBC: 9.3 x10ˆ3/uL (ref 3.5–11.0)

## 2016-05-13 LAB — POCT ISTAT CG8 BLOOD GAS ELECTROLYTES HEMATOCRIT (RESULTS)
BASE EXCESS (BEP): 1 mmol/L (ref ?–2.0)
GLUCOSE, POC: 88 mg/dL (ref 70–105)
HCO3 (HCO3P): 26 mmol/L (ref 22–26)
HEMATOCRIT, POC: 39 % — ABNORMAL LOW (ref 39.8–50.2)
HEMOGLOBIN, POC: 13.3 g/dL (ref 11.5–16.5)
IONIZED CALCIUM, POC: 1.2 mmol/L — ABNORMAL LOW (ref 1.30–1.46)
IONIZED CALCIUM, POC: 1.2 mmol/L — ABNORMAL LOW (ref 1.30–1.46)
PCO2 (PCO2P): 37 mmHg (ref 35–45)
PH (PHP): 7.45 (ref 7.35–7.45)
PO2 (PO2P): 27 mmHg — CL (ref 72–100)
POTASSIUM, POC: 4.1 mmol/L (ref 3.5–5.0)
SO2 (SO2P): 53 % — CL (ref >=80)
SO2 (SO2P): 53 % — CL (ref >=80)
SODIUM, POC: 141 mmol/L (ref 136–145)
TCO2 (TOC2P): 27 mmol/L (ref 23–33)
TCO2 (TOC2P): 27 mmol/L (ref 23–33)

## 2016-05-13 LAB — ECG 12-LEAD
Atrial Rate: 96 {beats}/min
Calculated R Axis: 59 degrees
Calculated T Axis: -112 degrees
QRS Duration: 152 ms
QT Interval: 404 ms
QTC Calculation: 472 ms
Ventricular rate: 82 {beats}/min

## 2016-05-13 SURGERY — CARDIAC ABLATION - AV NODE

## 2016-05-13 MED ORDER — LIDOCAINE HCL 20 MG/ML (2 %) INJECTION SOLUTION
Freq: Once | INTRAMUSCULAR | Status: DC | PRN
Start: 2016-05-13 — End: 2016-05-13
  Administered 2016-05-13: 10 mL via INTRADERMAL

## 2016-05-13 MED ORDER — HEPARIN (PORCINE) (PF) 1,000 UNIT/500 ML IN 0.9 % SODIUM CHLORIDE IV
INTRAVENOUS | Status: DC
Start: 2016-05-13 — End: 2016-05-13
  Filled 2016-05-13: qty 500

## 2016-05-13 MED ORDER — HEPARIN (PORCINE) 1,000 UNIT/ML INJECTION SOLUTION
INTRAMUSCULAR | Status: DC
Start: 2016-05-13 — End: 2016-05-13
  Filled 2016-05-13: qty 10

## 2016-05-13 MED ORDER — LIDOCAINE HCL 20 MG/ML (2 %) INJECTION SOLUTION
INTRAMUSCULAR | Status: DC
Start: 2016-05-13 — End: 2016-05-13
  Filled 2016-05-13: qty 20

## 2016-05-13 MED ORDER — DIPHENHYDRAMINE 50 MG/ML INJECTION SOLUTION
Freq: Once | INTRAMUSCULAR | Status: DC | PRN
Start: 2016-05-13 — End: 2016-05-13
  Administered 2016-05-13: 50 mg via INTRAVENOUS

## 2016-05-13 MED ORDER — FENTANYL (PF) 50 MCG/ML INJECTION SOLUTION
Freq: Once | INTRAMUSCULAR | Status: DC | PRN
Start: 2016-05-13 — End: 2016-05-13
  Administered 2016-05-13: 50 ug via INTRAVENOUS

## 2016-05-13 MED ORDER — MIDAZOLAM 1 MG/ML INJECTION SOLUTION
INTRAMUSCULAR | Status: DC
Start: 2016-05-13 — End: 2016-05-13
  Filled 2016-05-13: qty 12

## 2016-05-13 MED ORDER — FENTANYL (PF) 50 MCG/ML INJECTION SOLUTION
INTRAMUSCULAR | Status: DC
Start: 2016-05-13 — End: 2016-05-13
  Filled 2016-05-13: qty 4

## 2016-05-13 MED ORDER — DIPHENHYDRAMINE 50 MG/ML INJECTION SOLUTION
INTRAMUSCULAR | Status: DC
Start: 2016-05-13 — End: 2016-05-13
  Filled 2016-05-13: qty 1

## 2016-05-13 MED ORDER — MIDAZOLAM 1 MG/ML INJECTION SOLUTION
Freq: Once | INTRAMUSCULAR | Status: DC | PRN
Start: 2016-05-13 — End: 2016-05-13
  Administered 2016-05-13: 2 mg via INTRAVENOUS

## 2016-05-13 MED ORDER — SODIUM CHLORIDE 0.9 % INTRAVENOUS SOLUTION
INTRAVENOUS | Status: DC
Start: 2016-05-13 — End: 2016-05-13
  Administered 2016-05-13: 0 via INTRAVENOUS

## 2016-05-13 MED ADMIN — insulin lispro 100 unit/mL subcutaneous solution: INTRAVENOUS | @ 11:00:00

## 2016-05-13 SURGICAL SUPPLY — 10 items
CATH ABLT BLAZER2 HTD 7FR 8FR 110CM 5MM 2.5MM SPC LRG CURVE (ABALATION) ×1 IMPLANT
CONNECTOR CATH STERO RD 651 (CARDIAC) ×1 IMPLANT
ELECTRODE DEFIBR QCMBO 59-95F TEMP CONTROL ADULT (Electrical Supplies) IMPLANT
ELECTRODE DEFIBR RD 24IN EDGE_SYS QCMB LEADWIRE ADLT LP12 LF (Electrical Supplies) ×1
ELECTRODE PATIENT RTN 9FT VLAB NONREM PLHSV ACRL CORD ADH (NEUR) ×1 IMPLANT
NEEDLE 7CM 18GA WO BASEPLATE P_ROC PERC DISP TW VAS ACCESS (NEEDLES & SYRINGE SUPPLIES) ×1 IMPLANT
SHEATH 8FR 63CM 145CM FASTCATH SWARTZ .038IN J SR0 CURVE HMSTS INTROD TRANSSEPTAL (INTRODUCER) IMPLANT
SHEATH 8FR 63CM 145CM FASTCATH_SWARTZ .038IN J SR0 CURVE (INTRODUCER) ×1
SHEATH INTROD 10CM 8FR PINN .035IN PERI SS SNAPON DIL LOCK KINK RST SMOOTH TRNS SPRG COIL GW 2.5CM (INTRODUCER) IMPLANT
SHEATH INTROD PINNACLE 8FRX10_10/BX RSS801 (INTRODUCER) ×1

## 2016-05-13 NOTE — Nurses Notes (Signed)
Patient ambulated back to HVI pre-post. Offered restroom. Changed into gown. Instructed on use of call bell and television remote. Vitals signs obtained. Attempting IV access and lab collection at this time. Awaiting healthcare provider to consent patient. Will prep accordingly.Will continue to monitor.

## 2016-05-13 NOTE — Nurses Notes (Signed)
Pt and family provided and discussed discharge instructions.  Pt has ambulated to restroom independently and has voided.  Pt has been provided both fluids and snack, tolerated well.  Pt has no c/o discomfort at his time.  Pt has been escorted to exit via wheelchair by RN{discharge/transfer:[id number]

## 2016-05-13 NOTE — Discharge Instructions (Signed)
Procedural Sedation (Adult)  You have been given medicine by vein to make you sleep during your surgery. This may have included both a pain medicine and sleeping medicine. Most of the effects have worn off. But you may still have some drowsiness for the next 6 to 8 hours.  Home care  Follow these guidelines when you get home:   For the next 8 hours, you should be watched by a responsible adult. This person should make sure your condition is not getting worse.   Don't take any medicine by mouth for pain or for sleep during the next 4 hours. These might react with the medicines you were given in the hospital. This could cause a much stronger response than usual.   Don't drink any alcoholfor the next 24 hours.   Don't drive, operate dangerous machinery, or make important business or personal decisionsduring the next 24 hours.  Follow-up care  Follow up with your healthcare provider if you are not alert and back to your usual level of activity within 12 hours.  When to seek medical advice  Call your healthcare provider right away if any of these occur:   Drowsiness gets worse   Weakness or dizziness gets worse   Repeated vomiting   You cannot be awakened  Date Last Reviewed: 05/01/2015   2000-2017 [employer] [address], [place], [place] [id number]. All rights reserved. This information is not intended as a substitute for professional medical care. Always follow your healthcare professional's instructions.        Having Catheter Ablation  A heart rhythm problem (arrhythmia) can make your heart beat too fast or in an irregular pattern. The problem is often caused by cells in your heart that aren't working as they should. Or, it may be the result of an abnormal electric circuit. It may cause bothersome symptoms, such as an irregular heartbeat, dizziness, shortness of breath, chest pain, or fainting. Your doctor has recommended catheter ablation to treat your arrhythmia. This non-surgical  procedure destroys the cells that are causing the problem.  Before the procedure    Before your catheter ablation, you will meet with the cardiac electrophysiologist (specially trained heart doctor) who will do the procedure. He or she will tell you how to get ready. You will likely be told to stop or changeyour heart rhythm medicines for a period of time before the procedure. Follow your doctor's instructions. Also:   Tell the doctor about all prescription and over-the-counter medicines you take. This includes herbs, supplements, and vitamins. It also includes daily medicines, such as insulin or blood thinners. If you are allergic to any medicines, tell the doctor.   Have any routine tests, such as blood tests, as recommended.   Don't eat or drink anything12 hoursbefore the procedure.  How catheter ablation is done  Catheter ablation uses thin, flexible wires (electrode catheters) to find and destroy (ablate) problem cells. Here's how the procedure is done:   The heart's signals are mapped.To find the problem, an electrophysiology study (EPS) is done. During this study, the doctor tries to start (induce) your arrhythmia. An electrical map of the heart is then created. This shows the type of arrhythmia you have and where the problem is. Using the map as a guide, the doctor knows where to ablate.   Problem areas are destroyed using heat or cold therapy.Once the EPS shows where the problem is, an electrode catheter is thread through a blood vessel to that area in the  heart. Energy is sent through the catheter to destroy the problem cells.   The heart's rhythm is tested again.After ablating the problem cells, the doctor tries to restart (reinduce) your arrhythmia. If a fast rhythm can't be induced, the ablation is a success. But if a fast rhythm does start again, further ablation may be needed.  Your experience during catheter ablation  In most cases, catheter ablation is done in an electrophysiology (EP)  lab. It often takes2 to 4hours, and sometimes longer. You'll receive medicine to prevent pain. Medicine will also help you relax or sleep during the procedure. If you feel uncomfortable during the procedure, tell the doctor or nurse:   Getting started.First, skin on your groin (or rarely, the neck) is washed. Any hair in that area may be removed. This is where the catheters will be inserted. An IV (intravenous) line is started in your arm. Medicines and fluids are provided through this IV. To help keep the insertion site germ-free (sterile), your body is draped with sheets. Only the area where the catheters will be inserted is exposed.   Inserting the catheters.The skin where the catheters will be inserted is numbed with a local anesthetic. This is so you won't feel pain. Then a small needle is used to make punctures in your vein or artery. Catheters are inserted through these punctures and guided to the heart with the help of X-ray monitors.   Wires are then placed in various places in the heart to map the electrical signals as well as to stimulate the heart.   Finishing up.When the procedure is finished, the catheters are taken out of your body. Pressure is applied to the puncture sites to stop any bleeding. No stitches are needed. You're then taken to a recovery room to rest. You'll need to remain lying down fora fewhours. You'll be asked not to move the leg where the catheters were inserted for a few hours.  Risks and complications  The risks of catheter ablation are fairly low compared to the benefits you receive. Discuss these risks with your doctor before the procedure. Possible risks and complications include:   Bleeding or bruising at the catheter insertion site   Blood clots   A slow heart rhythm (requiring a permanent pacemaker)   Perforation of the heart muscle, blood vessel, or lung (may require an emergency procedure)   Damage to a heart valve (rare)   Stroke or heart attack, also  known as acute myocardial infarction, or AMI (rare)   Infection, which is a risk after any invasive procedure. This may be indicated by a fever of 100.92F (38.0C) or higher, drainage, or redness and pain at the catheter insertion site.   Death (extremely rare)   Date Last Reviewed: 11/12/2014   2000-2017 [employer] [address] [place], [place] [address]. All rights reserved. This information is not intended as a substitute for professional medical care. Always follow your healthcare professional's instructions.

## 2016-05-13 NOTE — Progress Notes (Signed)
EP Procedure Note    Successful AV node ablation.    Access: RFV 8.97F    No complications.    Bedrest x 4 hours.  EKG.    Discharge to home.    Follow-up in [place] in 1-2 months.    Uvaldo Rising, MD  05/13/2016, 15:43

## 2016-05-13 NOTE — Nurses Notes (Signed)
Returned s/p AV node ablation procedure.  Pt educated on bedrest,bleeding precautions and NPO status.  VSS, Dressing to Rt groin CDI.  Family at the beside.  All questions answered.

## 2016-05-14 LAB — ECG 12-LEAD
Atrial Rate: 86 {beats}/min
Atrial Rate: 86 {beats}/min
Calculated R Axis: -75 degrees
Calculated T Axis: 69 degrees
QRS Duration: 138 ms
QRS Duration: 138 ms
QT Interval: 412 ms
QTC Calculation: 444 ms
Ventricular rate: 70 {beats}/min

## 2016-05-16 NOTE — Sedation Documentation (Signed)
05/16/16  Procedure(s):  CARDIAC ABLATION - AV NODE    Diagnosis:     Sedation Informed Consent, pre-sedation risk assessment and evaluation completed.  History of previous adverse experiences with sedation/analgesia/anesthesia assessed.  Monitored conscious sedation was administered under my direct supervision by an appropriately trained sedation nurse.  Appropriate Facility and Equipment compliant.      Procedure time out  Timeouts     Aundra Dubin, RN at [facility] May 13, 2016 1312     Timeout Details     Timeout type Pre-procedure          Procedures     Panel 1: CARDIAC ABLATION - AV NODE with Montavis Schubring, Granville Lewis, MD          Timeout Questions     Correct patient? Yes    Correct site? Yes    Correct side? Yes    Correct position? Yes    Correct procedure? Yes    Site marked? Yes    H&P note completed? Yes    Consents verified? Yes    Radiology studies available? Yes    Relevant lab results available? Yes    Allergies reviewed? Yes    Are all required blood products & devices for the procedure available? Yes    Is documentation verified? Yes          Staff Present     Physicians Tekesha Almgren, Granville Lewis, MD    Staff Aundra Dubin, RN, Carolina Sink, [facility]          Verification History     Staff Performed Verified    Aundra Dubin, RN Tue May 13, 2016 4383 Tue May 13, 2016 1313                      Physician in  and out times  Physicians         Role Time In Time Out    Uvaldo Rising, MD Panel 1 Primary 05/13/2016 1313 05/13/2016 1348      Sedation Staff       Type Time In Time Out    Aundra Dubin, RN Invasive Nurse 05/13/2016 1205 05/13/2016 1404          Sedation and Procedure Times:  Sedation Start Time:: 1312  Sedation End/Recovery Start Time: 1345     Proc Name Event Type Event Time    CARDIAC ABLATION - AV NODE  Incision Start Tue May 13, 2016  1:15 PM    CARDIAC ABLATION - AV NODE  Incision Close Tue May 13, 2016  1:48 PM          Aldrete Scores    Pre Sedation  Activity: 2-->able to  move 4 extremities voluntarily or on command  Respiration: 2-->able to breathe and cough freely  Circulation: 2-->BP within 20% of pre-anesthetic level  Consciousness: 2-->fully awake  O2 Saturation: 2-->able to maintain O2 saturation greater than 92% on room air  Dressing: 2-->dry and clean or not applicable  Pain: 2-->pain free  Ambulation: 2-->able to stand up and walk straight, on ordered bedrest, or performing at previous level of functioning  Fasting/Feeding: 2-->able to drink fluids or NPO, minimal nausea/ no vomiting  Urine Output: 2-->has voided, adequate urine output per device, or not applicable  Modified Aldrete Score: 20      Post Sedation  Assessment Scored:: Post-Procedure  Activity: 2-->able to move 4 extremities voluntarily or on command  Respiration: 2-->able to breathe and cough freely  Circulation: 2-->BP within 20% of pre-anesthetic level  Consciousness: 1-->arousable on calling  O2 Saturation: 2-->able to maintain O2 saturation greater than 92% on room air  Dressing: 2-->dry and clean or not applicable  Pain: 2-->pain free  Ambulation: 2-->able to stand up and walk straight, on ordered bedrest, or performing at previous level of functioning  Urine Output: 2-->has voided, adequate urine output per device, or not applicable  Post Modified Aldrete Score: 19      Sedation Type: Moderate Sedation     Medications (moderate): Fentanyl, Versed  Hospital: [facility]  Unit: [facility]  IV Type: Peripheral IV  Additional Intervention needed:: No     Effects of Administration: Successful sedation w/o adverse events       Patient was continuously monitored throughout the procedure.  Provider was in attendance throughout sedation.  See Invasive Procedure Log for additional details.    Ricki Miller, MD

## 2016-05-22 ENCOUNTER — Ambulatory Visit (INDEPENDENT_AMBULATORY_CARE_PROVIDER_SITE_OTHER): Payer: Medicare Other | Admitting: Vascular & Interventional Radiology

## 2016-05-22 ENCOUNTER — Encounter (INDEPENDENT_AMBULATORY_CARE_PROVIDER_SITE_OTHER): Payer: Self-pay | Admitting: Vascular & Interventional Radiology

## 2016-05-22 VITALS — BP 134/84 | HR 68 | Ht 72.0 in | Wt 198.0 lb

## 2016-05-22 DIAGNOSIS — I739 Peripheral vascular disease, unspecified: Secondary | ICD-10-CM

## 2016-05-26 ENCOUNTER — Other Ambulatory Visit (INDEPENDENT_AMBULATORY_CARE_PROVIDER_SITE_OTHER): Payer: Self-pay | Admitting: Internal Medicine

## 2016-05-26 DIAGNOSIS — I480 Paroxysmal atrial fibrillation: Secondary | ICD-10-CM

## 2016-05-26 DIAGNOSIS — I447 Left bundle-branch block, unspecified: Secondary | ICD-10-CM

## 2016-05-26 DIAGNOSIS — I255 Ischemic cardiomyopathy: Secondary | ICD-10-CM

## 2016-05-26 DIAGNOSIS — I2510 Atherosclerotic heart disease of native coronary artery without angina pectoris: Secondary | ICD-10-CM

## 2016-05-27 ENCOUNTER — Other Ambulatory Visit (INDEPENDENT_AMBULATORY_CARE_PROVIDER_SITE_OTHER): Payer: Self-pay

## 2016-05-27 DIAGNOSIS — I480 Paroxysmal atrial fibrillation: Secondary | ICD-10-CM

## 2016-05-27 DIAGNOSIS — N189 Chronic kidney disease, unspecified: Secondary | ICD-10-CM

## 2016-05-27 DIAGNOSIS — Z95810 Presence of automatic (implantable) cardiac defibrillator: Secondary | ICD-10-CM

## 2016-05-27 DIAGNOSIS — R4182 Altered mental status, unspecified: Secondary | ICD-10-CM

## 2016-05-27 DIAGNOSIS — I10 Essential (primary) hypertension: Secondary | ICD-10-CM

## 2016-05-27 DIAGNOSIS — E119 Type 2 diabetes mellitus without complications: Secondary | ICD-10-CM

## 2016-05-27 DIAGNOSIS — I255 Ischemic cardiomyopathy: Secondary | ICD-10-CM

## 2016-05-27 DIAGNOSIS — I447 Left bundle-branch block, unspecified: Secondary | ICD-10-CM

## 2016-05-27 NOTE — H&P (Signed)
[facility]  [address] [address]  [place] [id number]  Phone: [phone]  Fax: [phone]      Encounter Date: 05/22/2016    Patient ID:  [name]  LKG:[id number]    DOB:   Age: 75 y.o. male    Subjective:     Chief Complaint   Patient presents with   . Peripheral Vascular Disease     room 10[facility]        HPI  [name] is a 75 y.o. male who was seen today for evaluation and treatment of lower extremity arterial disease.  Patient had lower extremity arterial study vascular [place] which showed mild-to-moderate lower extremity arterial disease.  Patient has claudication with walking.  He has risk factors of diabetes and hypertension.  No history of vascular intervention    Current Outpatient Prescriptions   Medication Sig   . albuterol sulfate (PROVENTIL OR VENTOLIN OR PROAIR) 90 mcg/actuation Inhalation HFA Aerosol Inhaler Take 1-2 Puffs by inhalation Every 6 hours as needed   . apixaban (ELIQUIS) 5 mg Oral Tablet Take 5 mg by mouth Twice daily   . aspirin (ECOTRIN) 81 mg Oral Tablet, Delayed Release (E.C.) Take 81 mg by mouth Once a day   . carvedilol (COREG) 12.5 mg Oral Tablet Take 12.5 mg by mouth Twice daily with food   . cholecalciferol, vitamin D3, 1,000 unit Oral Tablet Take 5,000 Units by mouth Once a day    . Coenzyme Q10 (CO Q-10) 100 mg Oral Capsule Take by mouth Once a day    . cyanocobalamin (VITAMIN B12) 1,000 mcg/mL Injection Solution 1,000 mcg by Subcutaneous route Every 30 days   . digoxin (LANOXIN) 125 mcg Oral Tablet Take 0.125 mg by mouth Once a day   . folic acid (FOLVITE) 1 mg Oral Tablet Take 1 mg by mouth Once a day   . furosemide (LASIX) 40 mg Oral Tablet Take 40 mg by mouth Once a day    . glipiZIDE (GLUCOTROL) 5 mg Oral Tablet Take 10 mg by mouth Twice a day before meals Take 30 minutes before meals    . Iron, Carbonyl 45 mg Oral Tablet Take by mouth Once a day   . losartan (COZAAR) 25 mg Oral Tablet  Take 12.5 mg by mouth Once a day   . metFORMIN (GLUCOPHAGE) 500 mg Oral Tablet Take 500 mg by mouth Twice daily with food   . nitroglycerin (NITROSTAT) 0.4 mg Sublingual Tablet, Sublingual 1 Tab (0.4 mg total) by Sublingual route Every 5 minutes as needed for Chest pain. for 3 doses over 15 minutes   . Omega-3 Fatty Acids-Vitamin E (FISH OIL) 1,000 mg Oral Capsule Take 1,000 mg by mouth Once a day   . ranitidine (ZANTAC) 150 mg Oral Tablet Take 150 mg by mouth Once a day    . VIT A/VIT C/VIT E/ZINC/COPPER (ICAPS AREDS ORAL) Take by mouth Twice daily     Allergies   Allergen Reactions   . Lipitor [Atorvastatin]      Concerned about cognitive function.   . Pravastatin Rash       Past Medical History:   Diagnosis Date   . BPH (benign prostatic hypertrophy)    . CAD (coronary artery disease) 09/25/2011   . CHF (NYHA class II, ACC/AHA stage C) (HCC) 10/24/2013   . CVA (cerebrovascular accident) (HCC)    . Diabetes mellitus (HCC)    . Diabetes mellitus,  type 2 (HCC)    . HTN (hypertension)    . MI (myocardial infarction)    . Renal stones    . Wears dentures    . Wears glasses          Past Surgical History:   Procedure Laterality Date   . CORONARY ARTERY ANGIOPLASTY     . HX ADENOIDECTOMY     . HX APPENDECTOMY     . HX HEART CATHETERIZATION     . HX LITHOTRIPSY     . HX PACEMAKER DEFIBRILLATOR PLACEMENT     . HX TONSILLECTOMY           Family Medical History     Problem Relation (Age of Onset)    Cancer Sister    Coronary Artery Disease Other    Diabetes Mother, Sister    High Cholesterol Father    Hypertension Father            Social History   Substance Use Topics   . Smoking status: Never Smoker   . Smokeless tobacco: Never Used   . Alcohol use No       Review of  Systems:     Constitutional Negative for: Fever, Weight Loss, Weakness     Skin Negative for: Rash  HENT Positive for: Nosebleeds  HENT Negative for: Headaches     Eyes Negative for: Blurred Vision  Cardiovascular Positive for: Leg Swelling  Cardiovascular  Negative for: Chest Pain     Respiratory Negative for: Shortness of Breath, Cough  Gastrointestinal Positive for: Blood in Stool  Gastrointestinal Negative for: Abdominal Pain     Genitourinary Negative for: Hematuria     Musculoskeletal Negative for: Falls     Endo/Heme/Allergy Negative for: Easy Bruise/Bleed     Neurological Negative for : Dizziness, Speech Change, Focal Weakness, Seizures  Psychiatric Positive for: Memory Loss  Psychological Negative for: Depression, Suicidal Ideas, Substance Abuse          Objective:   Vitals: BP 134/84  Pulse 68  Ht 1.829 m (6')  Wt 89.8 kg (198 lb)  SpO2 98%  BMI 26.85 kg/m2    General Exam:    General:  appears in good health, no distress and vital signs reviewed  Cardiovascular:     Vascular    pulses 1+ throughout  Abdomen:  Soft, non-tender, Bowel sounds normal  Extremities:  No cyanosis or edema  Skin:  Skin warm and dry, No rashes and No lesions    Ancillary Tests Reviewed:          ENCOUNTER DIAGNOSES     ICD-10-CM   1. PAD (peripheral artery disease) (HCC) I73.9       Assessment       Lower extremity arterial disease with claudication  Diabetes  Hypertension        Plan     Will plan for lower extremity angiogram with intervention based on angiogram findings.   Risks, benefits, and alternatives to surgery were discussed with patient.  All questions asked and answered.  Appropriate consents were obtained.             Blessin Kanno Suzan Garibaldi, APRN

## 2016-05-30 ENCOUNTER — Ambulatory Visit (INDEPENDENT_AMBULATORY_CARE_PROVIDER_SITE_OTHER): Payer: Medicare Other | Admitting: Physician Assistant

## 2016-05-30 ENCOUNTER — Encounter (INDEPENDENT_AMBULATORY_CARE_PROVIDER_SITE_OTHER): Payer: Self-pay | Admitting: Physician Assistant

## 2016-05-30 VITALS — BP 138/84 | HR 75 | Resp 16 | Ht 72.0 in | Wt 199.0 lb

## 2016-05-30 DIAGNOSIS — Z9889 Other specified postprocedural states: Secondary | ICD-10-CM

## 2016-05-30 DIAGNOSIS — I447 Left bundle-branch block, unspecified: Secondary | ICD-10-CM

## 2016-05-30 DIAGNOSIS — Z950 Presence of cardiac pacemaker: Secondary | ICD-10-CM

## 2016-05-30 DIAGNOSIS — I255 Ischemic cardiomyopathy: Secondary | ICD-10-CM

## 2016-05-30 DIAGNOSIS — I739 Peripheral vascular disease, unspecified: Secondary | ICD-10-CM

## 2016-05-30 DIAGNOSIS — I4891 Unspecified atrial fibrillation: Secondary | ICD-10-CM

## 2016-05-30 NOTE — Progress Notes (Signed)
[place]  [address]  [place] [address]  Phone: [phone]  Fax: [phone]    Encounter Date: 05/30/2016    Patient ID:  [name]  EVO:[id number]    DOB:   Age: 75 y.o. 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 He had his procedure done with Dr. Beverley [name] at [facility] on 05/13/16. He also had a recent limited TTE on 05/26/16 that showed impaired LVSF with an EF 20% and resolution of his apical thrombus. He remains on Eliquis 5 mg BID without complications. He used to take Amiodarone therapy and his PFTs revealed severe airway obstruction and reduced DLCO, so the medicine was discontinued. Today, he reports feeling better but admits to feeling weak and tired with no desire to be active. He has mild lower extremity edema and was recently seen by vascular at [facility] for his PAD. He hasn't had any weight gain, worsened SOB, orthopnea or PND. He resides with family due to his cognitive impairment. He hasn't had any fever, chills, cough, chest pain, abdominal pain, syncope or presyncope. No TIA or stroke-like symptoms.    Current Outpatient Prescriptions   Medication Sig    albuterol sulfate (PROVENTIL OR VENTOLIN OR PROAIR) 90 mcg/actuation Inhalation HFA Aerosol Inhaler Take 1-2 Puffs by inhalation Every 6 hours as needed    apixaban (ELIQUIS) 5 mg Oral Tablet Take 5 mg by mouth Twice daily    aspirin (ECOTRIN) 81 mg Oral Tablet, Delayed Release (E.C.) Take 81 mg by mouth Once a day    carvedilol (COREG) 12.5 mg Oral Tablet Take 12.5 mg by mouth Twice daily with food    cholecalciferol, vitamin D3, 1,000 unit Oral Tablet Take 5,000  Units by mouth Once a day     Coenzyme Q10 (CO Q-10) 100 mg Oral Capsule Take by mouth Once a day     cyanocobalamin (VITAMIN B12) 1,000 mcg/mL Injection Solution 1,000 mcg by Subcutaneous route Every 30 days    digoxin (LANOXIN) 125 mcg Oral Tablet Take 0.125 mg by mouth Once a day    folic acid (FOLVITE) 1 mg Oral Tablet Take 1 mg by mouth Once a day    furosemide (LASIX) 40 mg Oral Tablet Take 40 mg by mouth Once a day     glipiZIDE (GLUCOTROL) 5 mg Oral Tablet Take 10 mg by mouth Twice a day before meals Take 30 minutes before meals     INULIN (FIBER GUMMIES ORAL) Take by mouth    Iron, Carbonyl 45 mg Oral Tablet Take by mouth Once a day    losartan (COZAAR) 25 mg Oral Tablet Take 50 mg by mouth Once a day     metFORMIN (GLUCOPHAGE) 500 mg Oral Tablet Take 500 mg by mouth Twice daily with food    nitroglycerin (NITROSTAT) 0.4 mg Sublingual Tablet, Sublingual 1 Tab (0.4 mg total) by Sublingual route Every 5 minutes as needed for Chest pain. for 3 doses over 15 minutes    Omega-3 Fatty Acids-Vitamin E (FISH OIL) 1,000 mg Oral Capsule Take 1,000 mg by mouth Once a  day    ranitidine (ZANTAC) 150 mg Oral Tablet Take 150 mg by mouth Once a day     VIT A/VIT C/VIT E/ZINC/COPPER (ICAPS AREDS ORAL) Take by mouth Twice daily     Allergies   Allergen Reactions    Lipitor [Atorvastatin]      Concerned about cognitive function.    Pravastatin Rash     Past Medical History:   Diagnosis Date    BPH (benign prostatic hypertrophy)     CAD (coronary artery disease) 09/25/2011    CHF (NYHA class II, ACC/AHA stage C) ([place]) 10/24/2013    CVA (cerebrovascular accident) ([place])     Diabetes mellitus ([place])     Diabetes mellitus, type 2 ([place])     HTN (hypertension)     MI (myocardial infarction)     Renal stones     Wears dentures     Wears glasses          Past Surgical History:   Procedure Laterality Date    CORONARY ARTERY ANGIOPLASTY      HX ADENOIDECTOMY      HX APPENDECTOMY      HX CARDIAC ABLATION       HX HEART CATHETERIZATION      HX LITHOTRIPSY      HX PACEMAKER DEFIBRILLATOR PLACEMENT      HX TONSILLECTOMY           Family Medical History     Problem Relation (Age of Onset)    Cancer Sister    Coronary Artery Disease Other    Diabetes Mother, Sister    High Cholesterol Father    Hypertension Father            Social History   Substance Use Topics    Smoking status: Never Smoker    Smokeless tobacco: Never Used    Alcohol use No       Review of Systems   Constitutional: Positive for fatigue. Negative for activity change, appetite change, chills, fever and unexpected weight change.   HENT: Negative for hearing loss.    Respiratory: Negative for apnea, cough, chest tightness, shortness of breath and wheezing.    Cardiovascular: Negative for chest pain, palpitations and leg swelling.   Gastrointestinal: Negative for abdominal pain and blood in stool.   Genitourinary: Negative for hematuria.   Musculoskeletal: Positive for arthralgias. Negative for myalgias.   Neurological: Positive for weakness. Negative for dizziness, tremors, seizures, syncope, facial asymmetry, speech difficulty, light-headedness and headaches.   Psychiatric/Behavioral: Negative for sleep disturbance.     Objective:   Vitals: BP 138/84   Pulse 75   Resp 16   Ht 1.829 m (6')   Wt 90.3 kg (199 lb)   SpO2 97%   BMI 26.99 kg/m2    Physical Exam   Constitutional: He is oriented to person, place, and time. He appears well-developed.   Neck: Normal range of motion. Neck supple. No JVD present.   Cardiovascular: Normal rate, regular rhythm, S1 normal, S2 normal and normal pulses.  Exam reveals no gallop and no friction rub.    No murmur heard.  Underlying rhythm Afib. BiV paced.   Pulmonary/Chest: Effort normal and breath sounds normal. No respiratory distress. He has no wheezes. He has no rales. He exhibits no tenderness.   Abdominal: Soft. Bowel sounds are normal. There is no tenderness.   Musculoskeletal: Normal range of motion. He exhibits no  edema or tenderness.   Neurological: He is alert and oriented to person,  place, and time.   Psychiatric: He has a normal mood and affect. His behavior is normal.   Nursing note and vitals reviewed.    Blood work 05/20/16- Na 143, K 4.3, BUN 29, Creat 2.1 and eGFR 31.  AST 20/ALT 16.    Assessment & Plan:     ENCOUNTER DIAGNOSES     ICD-10-CM   1. Atrial fibrillation [facility]): sustained s/p AVN ablation. He failed Amiodarone therapy and it was discontinued due to reduced DLCO on PFT's. He was not a good candidate for Tikosyn due to cognitive impairment and CKD. Other antiarrhythmics were contraindicated due to reduced EF. He underwent ablation on 05/13/16. I48.91   2. S/P atrioventricular nodal ablation Z98.890   3. Biventricular cardiac pacemaker in situ: Normal device function and BP 97% of time. Underlying rhythm Afib. Z95.0   4. Ischemic cardiomyopathy: Stable. EF 20%. On Lasix 4m daily, Losartan, Carvedilol and Digoxin. I25.5   5. LBBB (left bundle branch block): chronic I44.7   6. PAD (peripheral artery disease) (HCC)ABI's 01/17/16 showed probable bilateral popliteal-tibial occlusive disease and abnormal toe pressures at rest indicating poor wound healing potential for ulcers. He is [facility] I73.9     Recommended further work-up for fatigue with PCP. May benefit from PT or cardiac rehabilitation.      No orders of the defined types were placed in this encounter.    Patient seen independently with supervising physician available for consultation.    Return in about 3 months (around 08/30/2016). with Dr. SBeverley [name] He also has an appointment in January with Dr. BLyndee Leo    ASamuella Bruin PA-C

## 2016-06-02 ENCOUNTER — Telehealth (INDEPENDENT_AMBULATORY_CARE_PROVIDER_SITE_OTHER): Payer: Self-pay | Admitting: Physician Assistant

## 2016-06-02 NOTE — Telephone Encounter (Signed)
Spoke to patient and family about cardiac rehab phase II. He is going to be in [place] until January and has procedures scheduled in January. They agreed to call the office in January when ready to start rehab, and we will place the order at that time.  Ashok Norris, PA-C  06/02/2016, 16:25

## 2016-06-13 ENCOUNTER — Other Ambulatory Visit (HOSPITAL_COMMUNITY)
Admission: RE | Admit: 2016-06-13 | Discharge: 2016-06-13 | Disposition: A | Discharge status: Home / Self Care | Payer: Medicare Other | Source: Ambulatory Visit | Attending: Family Medicine | Admitting: Family Medicine

## 2016-06-13 DIAGNOSIS — I2510 Atherosclerotic heart disease of native coronary artery without angina pectoris: Secondary | ICD-10-CM | POA: Diagnosis present

## 2016-06-13 LAB — COMPREHENSIVE METABOLIC PANEL
ALBUMIN: 4.5 g/dL (ref 3.5–5.0)
ALT: 17 U/L (ref 17–63)
ANION GAP: 10 (ref 5–15)
AST: 23 U/L (ref 15–41)
Alkaline Phosphatase: 105 U/L (ref 38–126)
BUN: 28 mg/dL — ABNORMAL HIGH (ref 6–20)
CHLORIDE: 103 mmol/L (ref 101–111)
CO2: 25 mmol/L (ref 22–32)
Calcium: 9.3 mg/dL (ref 8.9–10.3)
Creatinine, Ser: 1.66 mg/dL — ABNORMAL HIGH (ref 0.61–1.24)
GFR calc Af Amer: 45 mL/min — ABNORMAL LOW (ref >60)
GFR calc non Af Amer: 39 mL/min — ABNORMAL LOW (ref >60)
GLUCOSE: 123 mg/dL — AB (ref 65–99)
POTASSIUM: 3.9 mmol/L (ref 3.5–5.1)
SODIUM: 138 mmol/L (ref 135–145)
TOTAL PROTEIN: 6.5 g/dL (ref 6.5–8.1)
Total Bilirubin: 0.9 mg/dL (ref 0.3–1.2)

## 2016-07-03 ENCOUNTER — Ambulatory Visit (INDEPENDENT_AMBULATORY_CARE_PROVIDER_SITE_OTHER): Payer: Medicare Other

## 2016-07-03 ENCOUNTER — Encounter: Payer: Self-pay | Admitting: Physician Assistant

## 2016-07-03 ENCOUNTER — Ambulatory Visit (INDEPENDENT_AMBULATORY_CARE_PROVIDER_SITE_OTHER): Payer: Medicare Other | Admitting: Physician Assistant

## 2016-07-03 VITALS — BP 118/60 | HR 69 | Temp 97.4°F | Resp 16 | Ht 72.0 in | Wt 195.8 lb

## 2016-07-03 DIAGNOSIS — R05 Cough: Secondary | ICD-10-CM

## 2016-07-03 DIAGNOSIS — R059 Cough, unspecified: Secondary | ICD-10-CM

## 2016-07-03 DIAGNOSIS — J90 Pleural effusion, not elsewhere classified: Secondary | ICD-10-CM | POA: Diagnosis not present

## 2016-07-03 MED ORDER — BENZONATATE 100 MG PO CAPS: 100.0000 mg | ORAL_CAPSULE | Freq: Three times a day (TID) | ORAL | 0 refills | Status: AC | PRN

## 2016-07-03 MED ORDER — DOXYCYCLINE HYCLATE 100 MG PO CAPS: 100.0000 mg | ORAL_CAPSULE | Freq: Two times a day (BID) | ORAL | 0 refills | Status: AC

## 2016-07-03 NOTE — Patient Instructions (Addendum)
There is a small amount of fluid collection in the RIGHT lung. You need to follow-up with your provider at home, with a chest xray in 4 weeks.     IF you received an x-ray today, you will receive an invoice from [facility]. Please contact [facility] at [phone] with questions or concerns regarding your invoice.   IF you received labwork today, you will receive an invoice from [facility]. Please contact [facility] at [phone] with questions or concerns regarding your invoice.   Our billing staff will not be able to assist you with questions regarding bills from these companies.  You will be contacted with the lab results as soon as they are available. The fastest way to get your results is to activate your My Chart account. Instructions are located on the last page of this paperwork. If you have not heard from us regarding the results in 2 weeks, please contact this office.

## 2016-07-03 NOTE — Progress Notes (Signed)
Patient ID: Darius Saunders, male    DOB: August 02, 1940, 75 y.o.   MRN: [id number]  PCP: PROVIDER NOT IN SYSTEM  Chief Complaint  Patient presents with  . Cough    x 1 week yellow plegm     Subjective:   Presents for evaluation of cough. He is accompanied by his sister, Darius Saunders.  He lives in [place], and has been in [place] with his Sister since Thanksgiving. At that time, multiple family members developed upper respiratory symptoms: congestion, drainage, sore throat, cough. Everyone else recovered, but his cough persists. No CP, SOB, HA. No fever, chills. No nausea or vomiting. No urinary symptoms.  Generally feels bad-"blah."  He was admitted this past August with suspected pneumonia. At follow-up with his cardiologist, it was thought that amiodarone may have been causing the symptoms and was discontinued.    Review of Systems  Constitutional: Positive for fatigue. Negative for chills, diaphoresis and fever.  HENT: Positive for postnasal drip. Negative for congestion, drooling, ear pain, nosebleeds, rhinorrhea, sinus pain, sinus pressure, sore throat, trouble swallowing and voice change.   Respiratory: Positive for cough. Negative for choking, chest tightness, shortness of breath, wheezing and stridor.   Cardiovascular: Negative for chest pain, palpitations and leg swelling.  Gastrointestinal: Negative for diarrhea, nausea and vomiting.  Genitourinary: Negative for dysuria, frequency and urgency.  Neurological: Positive for weakness (generalized, not new). Negative for dizziness, light-headedness and headaches.       Patient Active Problem List   Diagnosis Date Noted  . AKI (acute kidney injury) (HCC) 02/15/2016  . Chronic combined systolic and diastolic congestive heart failure (HCC) 02/15/2016  . Pneumonia 02/14/2016  . Acute systolic congestive heart failure, NYHA class 3 (HCC) 07/10/2013  . Ischemic cardiomyopathy 07/10/2013  . Acute respiratory failure  with hypoxia (HCC) 07/09/2013  . CAD (coronary artery disease) 07/09/2013  . CAP (community acquired pneumonia) 07/08/2013  . Atrial fibrillation with RVR (HCC) 07/08/2013  . DM2 (diabetes mellitus, type 2) (HCC) 07/08/2013  . Chronic atrial fibrillation (HCC) 07/08/2013     Prior to Admission medications   Medication Sig Start Date End Date Taking? Authorizing Provider  carvedilol (COREG) 6.25 MG tablet Take 12.5 mg by mouth 2 (two) times daily with a meal.    Yes Historical Provider, MD  Cholecalciferol (VITAMIN D3) 5000 units CAPS Take 5,000 Units by mouth daily.   Yes Historical Provider, MD  Coenzyme Q10 (COQ-10) 100 MG CAPS Take 100 mg by mouth every evening.   Yes Historical Provider, MD  Cyanocobalamin (VITAMIN B-12 IJ) Inject 1,000 mg as directed every 30 (thirty) days.    Yes Historical Provider, MD  digoxin (LANOXIN) 0.125 MG tablet Take 1 tablet (0.125 mg total) by mouth daily. 07/13/13  Yes Alison MurrayAlma M Devine, MD  folic acid (FOLVITE) 1 MG tablet Take 1 mg by mouth daily.   Yes Historical Provider, MD  furosemide (LASIX) 20 MG tablet Take 20 mg by mouth daily.   Yes Historical Provider, MD  glipiZIDE (GLUCOTROL) 10 MG tablet Take 2.5 mg by mouth 2 (two) times daily.    Yes Historical Provider, MD  Iron, Ferrous Sulfate, 142 (45 Fe) MG TBCR Take 142 mg by mouth daily.   Yes Historical Provider, MD  Magnesium 100 MG TABS Take 100 mg by mouth daily.   Yes Historical Provider, MD  metFORMIN (GLUCOPHAGE) 500 MG tablet Take 500 mg by mouth 2 (two) times daily with a meal.    Yes Historical Provider, MD  metFORMIN (GLUCOPHAGE-XR)  500 MG 24 hr tablet Take 500 mg by mouth 2 (two) times daily.   Yes Historical Provider, MD  Multiple Vitamin (MULTIVITAMIN WITH MINERALS) TABS tablet Take 1 tablet by mouth 2 (two) times daily.    Yes Historical Provider, MD  Multiple Vitamins-Minerals (ICAPS AREDS 2) CAPS Take 1 tablet by mouth 2 (two) times daily.   Yes Historical Provider, MD  nitroGLYCERIN  (NITROSTAT) 0.4 MG SL tablet Place 0.4 mg under the tongue every 5 (five) minutes as needed for chest pain.  08/30/11  Yes Historical Provider, MD  Omega-3 Fatty Acids (FISH OIL) 1000 MG CAPS Take 1,000 mg by mouth every morning.   Yes Historical Provider, MD  ranitidine (ZANTAC) 150 MG tablet Take 150 mg by mouth daily.   Yes Historical Provider, MD  Rivaroxaban (XARELTO) 15 MG TABS tablet Take 15 mg by mouth every evening.   Yes Historical Provider, MD  spironolactone (ALDACTONE) 25 MG tablet Take 12.5 mg by mouth daily.   Yes Historical Provider, MD  vitamin C (ASCORBIC ACID) 500 MG tablet Take 500 mg by mouth 2 (two) times daily.   Yes Historical Provider, MD      Allergies  Allergen Reactions  . Other     Steroids- caused him to stay awake  . Statins Other (See Comments)    Rash, memory loss       Objective:  Physical Exam  Constitutional: He is oriented to person, place, and time. He appears well-developed and well-nourished. He is active and cooperative. No distress.  BP 118/60 (BP Location: Right Arm, Patient Position: Sitting, Cuff Size: Small)   Pulse 69   Temp 97.4 F (36.3 C) (Oral)   Resp 16   Ht 6' (1.829 m)   Wt 195 lb 12.8 oz (88.8 kg)   SpO2 98%   BMI 26.56 kg/m   HENT:  Head: Normocephalic and atraumatic.  Right Ear: Hearing and external ear normal.  Left Ear: Hearing and external ear normal.  Nose: Nose normal.  Mouth/Throat: Oropharynx is clear and moist. No oropharyngeal exudate.  Eyes: Conjunctivae are normal. No scleral icterus.  Neck: Normal range of motion. Neck supple. No thyromegaly present.  Cardiovascular: Normal rate, regular rhythm and normal heart sounds.   Pulses:      Radial pulses are 2+ on the right side, and 2+ on the left side.  Pulmonary/Chest: Effort normal.  Breath sounds are coarse  Lymphadenopathy:       Head (right side): No tonsillar, no preauricular, no posterior auricular and no occipital adenopathy present.       Head (left  side): No tonsillar, no preauricular, no posterior auricular and no occipital adenopathy present.    He has no cervical adenopathy.       Right: No supraclavicular adenopathy present.       Left: No supraclavicular adenopathy present.  Neurological: He is alert and oriented to person, place, and time. No sensory deficit.  Skin: Skin is warm, dry and intact. No rash noted. No cyanosis or erythema. Nails show no clubbing.  Psychiatric: He has a normal mood and affect. His speech is normal and behavior is normal.       Dg Chest 2 View  Result Date: 07/03/2016 CLINICAL DATA:  Cough for several weeks EXAM: CHEST  2 VIEW COMPARISON:  02/14/2016 FINDINGS: Cardiac shadow is stable. A defibrillator is again seen and stable in appearance. Small right pleural effusion is noted new from the prior exam. The basilar atelectatic changes have improved  significantly in the interval from the prior study. No acute bony abnormality is noted. IMPRESSION: Small right pleural effusion laterally. No definitive infiltrate is seen. Electronically Signed   By: Alcide Clever M.D.   On: 07/03/2016 11:24       Assessment & Plan:   1. Cough Elect to cover for atypicals, given comorbidities and new pleural effusion. Supportive care. - DG Chest 2 View; Future - doxycycline (VIBRAMYCIN) 100 MG capsule; Take 1 capsule (100 mg total) by mouth 2 (two) times daily.  Dispense: 20 capsule; Refill: 0 - benzonatate (TESSALON) 100 MG capsule; Take 1 capsule (100 mg total) by mouth 3 (three) times daily as needed for cough.  Dispense: 20 capsule; Refill: 0  2. Pleural effusion Possibly infectious. Treat as above. Follow-up with PCP in 4 weeks for follow-up CXR, sooner if symptoms worsen.   Fernande Bras, PA-C Physician Assistant-Certified [facility]

## 2016-07-10 ENCOUNTER — Ambulatory Visit: Payer: Medicare Other | Attending: Internal Medicine

## 2016-07-10 DIAGNOSIS — I447 Left bundle-branch block, unspecified: Secondary | ICD-10-CM

## 2016-07-10 DIAGNOSIS — I5020 Unspecified systolic (congestive) heart failure: Principal | ICD-10-CM | POA: Insufficient documentation

## 2016-07-10 DIAGNOSIS — I4891 Unspecified atrial fibrillation: Secondary | ICD-10-CM

## 2016-07-10 DIAGNOSIS — Z95810 Presence of automatic (implantable) cardiac defibrillator: Secondary | ICD-10-CM | POA: Insufficient documentation

## 2016-07-10 DIAGNOSIS — I255 Ischemic cardiomyopathy: Secondary | ICD-10-CM

## 2016-07-10 NOTE — Procedures (Signed)
Patient's ICD was checked by remote transmission.    Patient's ICD was evaluated, typically including programmed   parameters, lead(s), battery, capture, sensing function(s), presence or   absence of therapies, and underlying heart rhythm. Often but not always   this includes sensor rate response, lower and upper heart rates, AV   intervals, pacing voltage and pulse duration, sensing value, and   diagnostics.  Please see programmer printout for details (scanned document).    SJM #[id number]  CRT-D.  Mode=DDD@70  bpm.  Battery status etr=4.0 years.  A: 390 ohms, P=nt, threshold=nt  [output=auto ].  RV: 340 ohms, R= > 12 mV, threshold=nt  [output=2.5V and 0.78ms ].  LV: 550 ohms, threshold=nt  [output=2.5V and 0.60ms ].  Ap=<1% ,Vp= 97% . AF burden=>99%.  VF detections=200 bpm. No recent vt/vf episodes.  No new alerts.  Normal device check.  Will recheck again in 6 months from home by remote transmission.  Darlyn Chamber, RN  12.28.17      PACEMAKER PROGRAMMING EVALUATION    Order:  See patient's medical record.    Procedure Description:  Standard programming evaluation using pacemaker programmer.    Data recording:  See printouts, where available, for details.    Physician Analysis and Reporting:  Unremarkable device analysis.  No major abnormalities, except as otherwise noted, if applicable.    Plan:  Plan routine follow-up, as appropriate, or unless otherwise indicated.  Please see notes and/or scanned printouts (where available) for details.    Hazle Quant, MD  Cardiac Electrophysiology  [facility]

## 2016-07-15 ENCOUNTER — Other Ambulatory Visit (INDEPENDENT_AMBULATORY_CARE_PROVIDER_SITE_OTHER): Payer: Self-pay | Admitting: Physician Assistant

## 2016-07-15 DIAGNOSIS — I255 Ischemic cardiomyopathy: Secondary | ICD-10-CM

## 2016-07-15 DIAGNOSIS — I4891 Unspecified atrial fibrillation: Secondary | ICD-10-CM

## 2016-07-15 DIAGNOSIS — I2510 Atherosclerotic heart disease of native coronary artery without angina pectoris: Secondary | ICD-10-CM

## 2016-07-22 ENCOUNTER — Encounter (INDEPENDENT_AMBULATORY_CARE_PROVIDER_SITE_OTHER): Payer: Medicare Other | Admitting: Family

## 2016-07-24 ENCOUNTER — Ambulatory Visit (INDEPENDENT_AMBULATORY_CARE_PROVIDER_SITE_OTHER): Payer: Medicare Other | Admitting: Internal Medicine

## 2016-07-24 VITALS — BP 110/58 | HR 69 | Resp 20 | Ht 72.0 in | Wt 182.0 lb

## 2016-07-24 DIAGNOSIS — I2510 Atherosclerotic heart disease of native coronary artery without angina pectoris: Principal | ICD-10-CM

## 2016-07-24 DIAGNOSIS — N183 Chronic kidney disease, stage 3 unspecified (CMS HCC): Secondary | ICD-10-CM

## 2016-07-24 DIAGNOSIS — Z95810 Presence of automatic (implantable) cardiac defibrillator: Secondary | ICD-10-CM

## 2016-07-24 DIAGNOSIS — N189 Chronic kidney disease, unspecified: Secondary | ICD-10-CM

## 2016-07-24 DIAGNOSIS — E119 Type 2 diabetes mellitus without complications: Secondary | ICD-10-CM

## 2016-07-24 DIAGNOSIS — R197 Diarrhea, unspecified: Secondary | ICD-10-CM

## 2016-07-24 DIAGNOSIS — I480 Paroxysmal atrial fibrillation: Secondary | ICD-10-CM

## 2016-07-24 DIAGNOSIS — I255 Ischemic cardiomyopathy: Secondary | ICD-10-CM

## 2016-07-24 DIAGNOSIS — E785 Hyperlipidemia, unspecified: Secondary | ICD-10-CM

## 2016-07-24 MED ORDER — CARVEDILOL 3.125 MG TABLET
3.1250 mg | ORAL_TABLET | Freq: Two times a day (BID) | ORAL | 3 refills | Status: DC
Start: 2016-07-24 — End: 2016-08-31

## 2016-07-28 NOTE — Progress Notes (Signed)
OV dictated    Suzanne L. Brown, PA-C    I have seen and evaluated the patient jointly with Suzanne Brown PA-C and agree with her assessment, recommendations and plans.      Meryn Sarracino, MD 08/02/2016 10:14

## 2016-07-29 ENCOUNTER — Ambulatory Visit (INDEPENDENT_AMBULATORY_CARE_PROVIDER_SITE_OTHER): Payer: Medicare Other | Admitting: Family

## 2016-07-29 ENCOUNTER — Encounter (INDEPENDENT_AMBULATORY_CARE_PROVIDER_SITE_OTHER): Payer: Medicare Other | Admitting: Vascular & Interventional Radiology

## 2016-07-29 ENCOUNTER — Encounter (INDEPENDENT_AMBULATORY_CARE_PROVIDER_SITE_OTHER): Payer: Medicare Other | Admitting: Family

## 2016-07-29 VITALS — BP 116/78 | HR 70 | Resp 14 | Ht 72.0 in | Wt 181.0 lb

## 2016-07-29 DIAGNOSIS — I10 Essential (primary) hypertension: Secondary | ICD-10-CM

## 2016-07-29 DIAGNOSIS — N183 Chronic kidney disease, stage 3 unspecified (CMS HCC): Secondary | ICD-10-CM

## 2016-07-29 DIAGNOSIS — I129 Hypertensive chronic kidney disease with stage 1 through stage 4 chronic kidney disease, or unspecified chronic kidney disease: Secondary | ICD-10-CM

## 2016-07-29 DIAGNOSIS — R197 Diarrhea, unspecified: Secondary | ICD-10-CM

## 2016-07-29 DIAGNOSIS — I959 Hypotension, unspecified: Secondary | ICD-10-CM

## 2016-07-29 DIAGNOSIS — R600 Localized edema: Secondary | ICD-10-CM

## 2016-07-29 DIAGNOSIS — E119 Type 2 diabetes mellitus without complications: Secondary | ICD-10-CM

## 2016-07-29 LAB — ENTER/EDIT NEPHROLOGY EXTERNAL LABS
ALBUMIN (SERUM): 3.1
ALBUMIN (SERUM): 3.1
BUN/CREAT RATIO: 16.5
BUN/CREAT RATIO: 17.37
BUN: 27
BUN: 33
BUN: 33
CALCIUM: 9.5
CARBON DIOXIDE: 21
CARBON DIOXIDE: 22
CHLORIDE: 102
CHLORIDE: 104
CREATININE, UR RAND: 201.4
CREATININE: 1.8
CREATININE: 1.9
CREATININE: 2
ESTIMATED GLOMERULAR FILTRATION RATE: 33
ESTIMATED GLOMERULAR FILTRATION RATE: 35
ESTIMATED GLOMERULAR FILTRATION RATE: 37
GLUCOSE, FASTING: 119
HCT: 35.7
HCT: 36.9
HCT: 39.8
HEMOGLOBIN A1C: 6.8
HGB: 11.3
HGB: 11.7
HGB: 12.6
MAGNESIUM: 1.7
MAGNESIUM: 1.7
MICROALBUMIN RANDOM URINE: 139.5
PLATELET COUNT: 252
POTASSIUM: 3.4
POTASSIUM: 4.8
SODIUM: 138
SODIUM: 138
TOTAL PROTEIN: 5.7

## 2016-07-29 NOTE — Progress Notes (Signed)
 [facility] Department of Medicine  [address]  [place], [place] [address]      PROGRESS NOTE    PATIENT NAME: [name], [name]  CHART NUMBER: [id number]  DATE OF BIRTH:   DATE OF SERVICE:  07/29/2016     IDENTIFICATION:  This is a 76 y.o. Caucasian male who is being followed in the [facility] for chronic kidney disease stage III with baseline creatinines ranging 1.7-1.9 for the past year, most likely multifactorial in etiology with uncontrolled diabetes, hypertension, atherosclerosis and heart failure.  I last saw him on April 08, 2016 and at that time his most recent creatinine level was 1.9.  He is here today in scheduled followup.    SUBJECTIVE:  The patient presents to clinic today accompanied by his sister.  She tells me that he has not been doing very well.  He had been having a lot of problems with diarrhea and, last week, was advised to stop metformin.  He feels very weak, appetite is poor, and he had had a weight loss of about 20 lbs.  They tell me that this all started when he was diagnosed with a virus.  They initially thought he had pneumonia and he was hospitalized.  That was ruled out and he was discharged from the hospital on January 7.  His BPs have been low and his coreg was cut in half.    He denies current chest pain or shortness of breath.  No lower extremity edema currently, he did in the past before and his pacemaker was placed.  Appetite is poor.  No nausea or vomiting.  No fever or chills.   He denies dysuria or hematuria.  He feels that urine output is adequate with fluid intake.  Hoping to start feeling better so he can go to rehab.      MEDICATIONS:   Current Outpatient Prescriptions   Medication Sig   . albuterol sulfate (PROVENTIL OR VENTOLIN OR PROAIR) 90 mcg/actuation Inhalation HFA Aerosol Inhaler Take 1-2 Puffs by inhalation Every 6 hours as needed   . apixaban (ELIQUIS) 5 mg Oral Tablet Take 5 mg by mouth Twice daily   . aspirin (ECOTRIN) 81  mg Oral Tablet, Delayed Release (E.C.) Take 81 mg by mouth Once a day   . carvedilol (COREG) 3.125 mg Oral Tablet Take 1 Tab (3.125 mg total) by mouth Twice daily with food   . cholecalciferol, vitamin D3, 1,000 unit Oral Tablet Take 5,000 Units by mouth Once a day    . Coenzyme Q10 (CO Q-10) 100 mg Oral Capsule Take by mouth Once a day    . cyanocobalamin (VITAMIN B12) 1,000 mcg/mL Injection Solution 1,000 mcg by Subcutaneous route Every 30 days   . digoxin (LANOXIN) 125 mcg Oral Tablet Take 0.125 mg by mouth Once a day   . folic acid (FOLVITE) 1 mg Oral Tablet Take 1 mg by mouth Once a day   . furosemide (LASIX) 40 mg Oral Tablet Take 40 mg by mouth Once a day    . glipiZIDE (GLUCOTROL) 5 mg Oral Tablet Take 2.5 mg by mouth Twice a day before meals Take 30 minutes before meals    . INULIN (FIBER GUMMIES ORAL) Take by mouth   . Iron, Carbonyl 45 mg Oral Tablet Take by mouth Once a day   . losartan (COZAAR) 25 mg Oral Tablet Take 100 mg by mouth Once a day    . nitroglycerin (NITROSTAT) 0.4 mg Sublingual Tablet,  Sublingual 1 Tab (0.4 mg total) by Sublingual route Every 5 minutes as needed for Chest pain. for 3 doses over 15 minutes   . Omega-3 Fatty Acids-Vitamin E (FISH OIL) 1,000 mg Oral Capsule Take 1,000 mg by mouth Once a day   . ranitidine (ZANTAC) 150 mg Oral Tablet Take 150 mg by mouth Once a day    . VIT A/VIT C/VIT E/ZINC/COPPER (ICAPS AREDS ORAL) Take by mouth Twice daily       OBJECTIVE:  [name] is a 76 y.o.  Caucasian male who presents to the [facility] today accompanied by his sister.  He is ambulatory with ,minimal difficulty and without the use of assistive devices.  He is chronically ill in appearance, but  in no acute distress.  He is very pleasant and cooperative.      Vitals:    07/29/16 1136 07/29/16 1139   BP: 116/78 116/78   Pulse: 70 70   Resp: 14 14   Weight: 82.1 kg (181 lb) 82.1 kg (181 lb)   Height:  1.829 m (6')     HEENT:  Head is normocephalic and atraumatic.   Eyes are anicteric.  Mucous membranes moist.  Cardiac:  Normal S1, S2, regular rate and rhythm.  +Pacemaker.  Pulmonary:  Lungs are clear.  He displays good respiratory effort.  Abdomen:  Soft, nontender.  Skin:  Warm, dry and intact.  Extremities:  No lower extremity edema.  Psych:  Alert and oriented with normal mood and affect and appropriate response.  Neurologic:  No tremors or asterixis.    LABORATORY DATA:    Lab Results   Component Value Date    BUN 27 07/20/2016    BUN 33 07/19/2016    BUN 33 07/15/2016    CREATININE 1.8 07/20/2016    CREATININE 1.9 07/19/2016    CREATININE 2.0 07/15/2016    BUNCRRATIO 15 07/20/2016    BUNCRRATIO 17.37 07/19/2016    BUNCRRATIO 16.50 07/15/2016    GFR 37 07/20/2016    GFR 35 07/19/2016    GFR 33 07/15/2016    SODIUM 138 07/19/2016    SODIUM 138 07/15/2016    SODIUM 142 04/01/2016    POTASSIUM 3.4 07/19/2016    POTASSIUM 4.8 07/15/2016    POTASSIUM 4.0 04/01/2016    CHLORIDE 102 07/19/2016    CHLORIDE 104 07/15/2016    CHLORIDE 109 (H) 04/01/2016    CO2 22 07/19/2016    CO2 21 07/15/2016    CO2 24 04/01/2016    ANIONGAP 9 04/01/2016    ANIONGAP 10 07/04/2014    ANIONGAP 9 01/10/2014    CALCIUM 9.5 07/15/2016    CALCIUM 9.0 04/01/2016    CALCIUM 9.4 12/25/2015    PHOSPHORUS 3.3 04/01/2016    PHOSPHORUS 4.2 11/28/2014    PHOSPHORUS 2.6 08/21/2014    ALBUMIN 3.9 07/04/2014    HGB 11.3 07/20/2016    HGB 11.7 07/19/2016    HGB 12.6 07/15/2016    HCT 35.7 07/20/2016    HCT 36.9 07/19/2016    HCT 39.8 07/15/2016    INTACTPTH 140 (H) 04/01/2016    INTACTPTH 81.4 11/28/2014    INTACTPTH 132.4 (H) 09/05/2014    IRON 37 07/15/2016    IRON 61 04/01/2016    IRON 75 12/25/2015    IRONBINDCAP 343 07/15/2016    IRONBINDCAP 403 04/01/2016    IRONBINDCAP 384 12/25/2015    IRONSAT 30 09/19/2015    FERRITIN 173 07/15/2016    FERRITIN 91  04/01/2016    FERRITIN 58 12/25/2015    25HYDVITD3 49.0 07/04/2014    HA1C 6.8 07/15/2016    HA1C 6.6 (H) 04/01/2016    HA1C 9.2 12/25/2015         RADIOLOGY  DATA:  Renal ultrasound from [facility] dated March 23, 2014, indicates right kidney at 9.3 cm with no evidence of hydronephrosis, renal calculi or renal cortical thinning.  Left kidney 12.4 cm and it demonstrates flow with no evidence of hydronephrosis, renal calculi or renal cortical thinning.  There is a calculus in the posterior aspect of the bladder measuring 1.6 cm x 2 cm.    ASSESSMENT/PLAN:  1.  Chronic kidney disease stage III with baseline creatinines ranging 1.7-1.9 and most likely etiology multifactorial in etiology with uncontrolled diabetes, hypertension and atherosclerosis and heart failure.  His most recent creatinine level indicates stability at 1.8.  We will continue to monitor with labs and clinic visits.  I did remind him on the importance of complete avoidance of NSAIDs and IV contrast dyes if at all possible and he verbalized understanding.  2.  Lower extremity edema, currently non-existent. I feel that his symptoms currently may be due to hypovolemia with recent diarrhea, virus, decrease fluid intake, and use of diuretic.  At this time, he will discontinue lasix and increase fluid intake.  He will have labs repeated again next week.    3.  Hypertension.  Blood pressure adequately controlled.  No changes in medications to be made at this time.  4.  Diabetes mellitus, much improved.   Previous A1c above goal at 9.2 and most recently at 6.8.   He will continue to follow with his primary care provider for management of diabetes with goal A1c less than 7%.   Metformin dc'd because of diarrhea.  His most recent urine test was negative for protein.    5.  Disposition.  He will return to the [facility] in 6 weeks for reevaluation of above-mentioned conditions.  He will have lab work completed in the interim.    The patient was seen independently.      Candie Chroman, ANP  Section of Nephrology  [facility] Department of Medicine

## 2016-07-29 NOTE — Progress Notes (Signed)
PATIENT NAME: [name], [name]  HOSPITAL NUMBER:  [id number]  DATE OF SERVICE: 07/24/2016  DATE OF BIRTH:      PROGRESS NOTE    HISTORY OF PRESENT ILLNESS:  This is a 76 year old white male followed in the clinic with a history of coronary artery disease.  In 2013, he had stenting of his LAD as well as RCA.  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  He has had AV node ablation and failed amiodarone therapy.  Most recently, he has had a hospitalization in [place] as well as [facility] where he was seen for pneumonia.  He has had a 30-pound unintentional weight loss and marketed decrease in physical activity.  He was seen with renal insufficiency.  Creatinine had been as high as 2.0, now at 1.8, BUN of 27, recent potassium of 3.1.  He has been on antibiotic therapy now for a few months and as well as having watery diarrhea stools and will get cultures for C diff, asked him to please follow up with nephrology this week as scheduled as as his primary care physician.  He has had no complaints of chest pain, shortness of breath or dyspnea upon exertion.  He has marked fatigue as well as paleness, recent H and H was 11.3 and 35.7 without documented bleed.  They are concerned requesting cardiac rehab and those arrangements could be made after he has his evaluation.  He has possible symptoms possibly stemming from renal.    ALLERGIES:  LIPITOR and PRAVASTATIN.    MEDICATIONS:  1. Proventil inhaler.  2. Eliquis 5 mg p.o. b.i.d.  3. Aspirin 81 mg daily.  4. Coreg 3.125 mg p.o. b.i.d.  5. Vitamin D3 5000 units daily.  6. Co Q10 daily.  7. Vitamin B12 1000 mcg subcu every 30 days.  8. Lanoxin 125 mcg daily.  9. Folvite 1 mg daily.  10. Lasix 40 mg daily.  11. Glucotrol 2.5 mg b.i.d.  12. Fiber gummies.  13. Iron 45 mg daily.  14. Cozaar 100 mg daily.  15. Glucophage 500 mg b.i.d.  16. Fish oil 1000 mg daily.  17. Zantac 150 mg daily.  18. Multivitamin b.i.d.     REVIEW OF SYSTEMS:  No fever or chills.  No headache, syncope or near syncopal event.  However, he does have marked generalized weakness.  No chest pain, shortness of breath at rest or dyspnea upon exertion.  No orthopnea or PND.  No palpitations.  No cough or hemoptysis.  No abdominal pain.  No nausea, vomiting.  He does have diarrhea, watery stools, several a day.  No melena or tarry stool.  No edema and he has had a 30-pound unintentional weight loss.    PHYSICAL EXAMINATION:  Blood pressure 110/58, pulse 69 and regular, respirations 20, even and nonlabored.  Height is 6 feet 0 inch, weight 182 pounds and SpO2 is 100% on room air.  On general exam, he is alert and oriented x3, markedly frail.  Normocephalic atraumatic.  Sclerae is clear, nonicteric.  Neck is supple without adenopathy, JVD or bruit.  There is no thyromegaly.  Lungs are clear to auscultation with equal air entry bilaterally.  Heart is regular rate without audible murmur, gallop or rub.  Abdomen is soft, nontender, nondistended without hepatosplenomegaly or palpable mass.  There are positive bowel sounds throughout.  Extremities are without clubbing, cyanosis or edema as well as symmetrical pulses.    ASSESSMENT:  1. Diarrhea.  2. Stable coronary artery disease.  3. Diabetes mellitus, non-insulin dependent.  4. Paroxysmal atrial fibrillation.  5. 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  6. Implantable cardioverter defibrillator.  7. Chronic kidney disease stage 3.  8. Hyperlipidemia.    PLAN:  1. I asked him to please be diligent about nephrology follow.  He has an upcoming appointment.  As well, we asked him to return to [facility] for completion of his diarrhea workup, C diff x3.  2. We will see him back in the clinic in 3 months, sooner if needed.        Gweneth Fritter, PA-C  Section of Cardiology   [facility] Department of Medicine       Vincent Gros, MD  Assistant Professor   [facility] Department of Medicine, Section of Cardiology             CC:   Verita Schneiders, MD   [address] [employer] [place], [place] [address]       DD:  07/28/2016 14:47:49  DT:  07/29/2016 01:11:43 MK  D#:  [id number]      I have seen and evaluated the patient jointly with Herbie Saxon PA-C and agree with her assessment, recommendations and plans. CKD, ischemic cardiomyopathy, NYHA class III symptoms, poor balance and overall fatigue and tiredness, had recent diarrhea, recommend to see nephrology and follow up with PCP, with consideration of neuro assessment.      Vincent Gros, MD 08/02/2016 10:09

## 2016-08-07 ENCOUNTER — Encounter (HOSPITAL_BASED_OUTPATIENT_CLINIC_OR_DEPARTMENT_OTHER): Payer: Self-pay | Admitting: Family

## 2016-08-07 LAB — ENTER/EDIT NEPHROLOGY EXTERNAL LABS
BUN: 25 — ABNORMAL HIGH
CARBON DIOXIDE: 26
CHLORIDE: 100
CREATININE: 1.8 — ABNORMAL HIGH
ESTIMATED GLOMERULAR FILTRATION RATE: 37
POTASSIUM: 4
SODIUM: 138

## 2016-08-13 ENCOUNTER — Encounter (INDEPENDENT_AMBULATORY_CARE_PROVIDER_SITE_OTHER): Payer: Medicare Other | Admitting: Family

## 2016-08-19 ENCOUNTER — Encounter (INDEPENDENT_AMBULATORY_CARE_PROVIDER_SITE_OTHER): Payer: Medicare Other | Admitting: Family

## 2016-08-25 ENCOUNTER — Encounter (INDEPENDENT_AMBULATORY_CARE_PROVIDER_SITE_OTHER): Payer: Medicare Other | Admitting: Family

## 2016-08-27 LAB — BASIC METABOLIC PANEL
ANION GAP: 11
BUN/CREAT RATIO: 22.35
BUN: 38
CALCIUM: 9.3
CARBON DIOXIDE: 24
CARBON DIOXIDE: 24
CHLORIDE: 104
CREATININE: 1.7
ESTIMATED GLOMERULAR FILTRATION RATE: 39
GLUCOSE,NONFAST: 105
POTASSIUM: 3.9
SODIUM: 139

## 2016-08-31 ENCOUNTER — Other Ambulatory Visit (INDEPENDENT_AMBULATORY_CARE_PROVIDER_SITE_OTHER): Payer: Self-pay

## 2016-09-01 ENCOUNTER — Encounter (HOSPITAL_COMMUNITY): Admission: RE | Payer: Self-pay | Source: Ambulatory Visit

## 2016-09-01 SURGERY — AORTOGRAM W RUN OFF IMAGING ONLY
Anesthesia: Local (Nurse-Monitored)

## 2016-09-02 ENCOUNTER — Encounter (INDEPENDENT_AMBULATORY_CARE_PROVIDER_SITE_OTHER): Payer: Medicare Other | Admitting: Family

## 2016-09-03 ENCOUNTER — Inpatient Hospital Stay (HOSPITAL_COMMUNITY)
Admission: RE | Admit: 2016-09-03 | Payer: Medicare Other | Source: Ambulatory Visit | Admitting: Vascular & Interventional Radiology

## 2016-09-09 ENCOUNTER — Encounter (INDEPENDENT_AMBULATORY_CARE_PROVIDER_SITE_OTHER): Payer: Medicare Other | Admitting: Family

## 2016-09-10 ENCOUNTER — Encounter (INDEPENDENT_AMBULATORY_CARE_PROVIDER_SITE_OTHER): Payer: Medicare Other | Admitting: Family

## 2016-09-16 ENCOUNTER — Ambulatory Visit (INDEPENDENT_AMBULATORY_CARE_PROVIDER_SITE_OTHER): Payer: Medicare Other | Admitting: Family

## 2016-09-16 VITALS — BP 124/80 | HR 76 | Resp 15 | Ht 72.0 in | Wt 208.0 lb

## 2016-09-16 DIAGNOSIS — N183 Chronic kidney disease, stage 3 unspecified (CMS HCC): Secondary | ICD-10-CM

## 2016-09-16 DIAGNOSIS — I129 Hypertensive chronic kidney disease with stage 1 through stage 4 chronic kidney disease, or unspecified chronic kidney disease: Secondary | ICD-10-CM

## 2016-09-16 DIAGNOSIS — R600 Localized edema: Secondary | ICD-10-CM

## 2016-09-16 DIAGNOSIS — I10 Essential (primary) hypertension: Secondary | ICD-10-CM

## 2016-09-16 DIAGNOSIS — E119 Type 2 diabetes mellitus without complications: Secondary | ICD-10-CM

## 2016-09-16 NOTE — Progress Notes (Signed)
 [facility] Department of Medicine  [address]  [place], [place] [address]      PROGRESS NOTE    PATIENT NAME: [name], [name]  CHART NUMBER: [id number]  DATE OF BIRTH:   DATE OF SERVICE:  09/16/2016     IDENTIFICATION:  This is a 76 y.o. Caucasian male who is being followed in the [facility] for chronic kidney disease stage III with baseline creatinines ranging 1.7-1.9 for the past year, most likely multifactorial in etiology with uncontrolled diabetes, hypertension, atherosclerosis and heart failure.  I last saw him on July 29, 2016 and at that time his most recent creatinine level was 1.8.  He had been having persistent diarrhea that time and was feeling bad.  It was felt that he had some dehydration.  I stopped his lasix.  He is here today in scheduled followup.    SUBJECTIVE:  The patient presents to clinic today accompanied by his sister.  She tells me that his diarrhea resolved and he developed edema again shortly after his las visit.  He was restarted on lasix, but at just 40 mg daily (he had been on 40 mg bid previously).  Edema improved some, but still an issue.  Does admit to a high sodium diet and a few days ago, he ate a can of soup with > 1800 mg of sodium.    He denies current chest pain or shortness of breath, more than his norm.  Does have lower extremity edema, but slightly improved.   Appetite is better.  No nausea or vomiting.  No fever or chills.   He denies dysuria or hematuria.  He feels that urine output is adequate with fluid intake.    MEDICATIONS:   Current Outpatient Prescriptions   Medication Sig    albuterol sulfate (PROVENTIL OR VENTOLIN OR PROAIR) 90 mcg/actuation Inhalation HFA Aerosol Inhaler Take 1-2 Puffs by inhalation Every 6 hours as needed    apixaban (ELIQUIS) 5 mg Oral Tablet Take 5 mg by mouth Twice daily    aspirin (ECOTRIN) 81 mg Oral Tablet, Delayed Release (E.C.) Take 81 mg by mouth Once a day    carvedilol (COREG) 3.125 mg Oral  Tablet TAKE 1 TABLET(3.125 MG) BY MOUTH TWICE DAILY WITH FOOD    cholecalciferol, vitamin D3, 1,000 unit Oral Tablet Take 5,000 Units by mouth Once a day     Coenzyme Q10 (CO Q-10) 100 mg Oral Capsule Take by mouth Once a day     cyanocobalamin (VITAMIN B12) 1,000 mcg/mL Injection Solution 1,000 mcg by Subcutaneous route Every 30 days    digoxin (LANOXIN) 125 mcg Oral Tablet Take 0.125 mg by mouth Once a day    folic acid (FOLVITE) 1 mg Oral Tablet Take 1 mg by mouth Once a day    furosemide (LASIX) 40 mg Oral Tablet Take 40 mg by mouth Once a day     glipiZIDE (GLUCOTROL) 5 mg Oral Tablet Take 2.5 mg by mouth Twice a day before meals Take 30 minutes before meals     INULIN (FIBER GUMMIES ORAL) Take by mouth    Iron, Carbonyl 45 mg Oral Tablet Take by mouth Once a day    losartan (COZAAR) 25 mg Oral Tablet Take 100 mg by mouth Once a day     nitroglycerin (NITROSTAT) 0.4 mg Sublingual Tablet, Sublingual 1 Tab (0.4 mg total) by Sublingual route Every 5 minutes as needed for Chest pain. for 3 doses over 15 minutes  Omega-3 Fatty Acids-Vitamin E (FISH OIL) 1,000 mg Oral Capsule Take 1,000 mg by mouth Once a day    ranitidine (ZANTAC) 150 mg Oral Tablet Take 150 mg by mouth Once a day     VIT A/VIT C/VIT E/ZINC/COPPER (ICAPS AREDS ORAL) Take by mouth Twice daily       OBJECTIVE:  [name] is a 76 y.o.  Caucasian male who presents to the [facility] today accompanied by his sister.  He is ambulatory with ,minimal difficulty and without the use of assistive devices.  He is chronically ill in appearance, but  in no acute distress.  He is very pleasant and cooperative.      Vitals:    09/16/16 1329   BP: 124/80   Pulse: 76   Resp: 15   Weight: 94.3 kg (208 lb)   Height: 1.829 m (6')     HEENT:  Head is normocephalic and atraumatic.  Eyes are anicteric.  Mucous membranes moist.  Cardiac:  Normal S1, S2, regular rate and rhythm.  +Pacemaker.  Pulmonary:  Lungs are clear.  He displays good  respiratory effort.  Abdomen:  Soft, nontender.  Skin:  Warm, dry and intact.  Extremities:  1+ bilateral lower extremity edema.  He is wearing compression stockings.  Psych:  Alert and oriented with normal mood and affect and appropriate response.  Neurologic:  No tremors or asterixis.    LABORATORY DATA:    Lab Results   Component Value Date    BUN 38 08/27/2016    BUN 25 (H) 08/05/2016    BUN 27 07/20/2016    CREATININE 1.7 08/27/2016    CREATININE 1.8 (H) 08/05/2016    CREATININE 1.8 07/20/2016    BUNCRRATIO 22.35 08/27/2016    BUNCRRATIO 15 07/20/2016    BUNCRRATIO 17.37 07/19/2016    GFR 39 08/27/2016    GFR 37 08/05/2016    GFR 37 07/20/2016    SODIUM 139 08/27/2016    SODIUM 138 08/05/2016    SODIUM 138 07/19/2016    POTASSIUM 3.9 08/27/2016    POTASSIUM 4.0 08/05/2016    POTASSIUM 3.4 07/19/2016    CHLORIDE 104 08/27/2016    CHLORIDE 100 08/05/2016    CHLORIDE 102 07/19/2016    CO2 24 08/27/2016    CO2 26 08/05/2016    CO2 22 07/19/2016    ANIONGAP 11.00 08/27/2016    ANIONGAP 9 04/01/2016    ANIONGAP 10 07/04/2014    CALCIUM 9.3 08/27/2016    CALCIUM 9.5 07/15/2016    CALCIUM 9.0 04/01/2016    PHOSPHORUS 3.3 04/01/2016    PHOSPHORUS 4.2 11/28/2014    PHOSPHORUS 2.6 08/21/2014    ALBUMIN 3.9 07/04/2014    HGB 11.3 07/20/2016    HGB 11.7 07/19/2016    HGB 12.6 07/15/2016    HCT 35.7 07/20/2016    HCT 36.9 07/19/2016    HCT 39.8 07/15/2016    INTACTPTH 140 (H) 04/01/2016    INTACTPTH 81.4 11/28/2014    INTACTPTH 132.4 (H) 09/05/2014    IRON 37 07/15/2016    IRON 61 04/01/2016    IRON 75 12/25/2015    IRONBINDCAP 343 07/15/2016    IRONBINDCAP 403 04/01/2016    IRONBINDCAP 384 12/25/2015    IRONSAT 30 09/19/2015    FERRITIN 173 07/15/2016    FERRITIN 91 04/01/2016    FERRITIN 58 12/25/2015    [id number] 49.0 07/04/2014    HA1C 6.8 07/15/2016    HA1C 6.6 (H) 04/01/2016  HA1C 9.2 12/25/2015         RADIOLOGY DATA:  Renal ultrasound from [facility] dated March 23, 2014, indicates right kidney at  9.3 cm with no evidence of hydronephrosis, renal calculi or renal cortical thinning.  Left kidney 12.4 cm and it demonstrates flow with no evidence of hydronephrosis, renal calculi or renal cortical thinning.  There is a calculus in the posterior aspect of the bladder measuring 1.6 cm x 2 cm.    ASSESSMENT/PLAN:  1.  Chronic kidney disease stage III with baseline creatinines ranging 1.7-1.9 and most likely etiology multifactorial in etiology with uncontrolled diabetes, hypertension and atherosclerosis and heart failure.  His most recent creatinine level indicates stability at 1.7.  We will continue to monitor with labs and clinic visits.  I did remind him on the importance of complete avoidance of NSAIDs and IV contrast dyes if at all possible and he verbalized understanding.  2.  Lower extremity edema.  At this time, he will increase the lasix back to 40 mg bid.  If edema resolves, he will decrease that back to once daily.  He will be cautious with sodium intake.  I advised him to hold the lasix completely if he were to develop vomiting or diarrhea, because of risk of dehydration.      3.  Hypertension.  Blood pressure adequately controlled.  No changes in medications to be made at this time.  4.  Diabetes mellitus, much improved.   Previous A1c above goal at 9.2 and most recently at 6.8.   He will continue to follow with his primary care provider for management of diabetes with goal A1c less than 7%.   Metformin dc'd because of diarrhea.  His most recent urine test was negative for protein.    5.  Disposition.  He will return to the [facility] in 2 months for reevaluation of above-mentioned conditions.  He will have lab work completed in the interim and was given an order for that today.    The patient was seen independently.      Candie Chroman, ANP  Section of Nephrology  [facility] Department of Medicine

## 2016-10-17 LAB — ENTER/EDIT NEPHROLOGY EXTERNAL LABS
A/G RATIO: 1.57
ALBUMIN (SERUM): 3.6
ALKALINE PHOSPHATASE: 154 — ABNORMAL HIGH
ALT (SGPT): 18
ALT (SGPT): 18
ANION GAP: 10
AST (SGOT): 26
BILIRUBIN, TOTAL: 1.8 — ABNORMAL HIGH
BUN/CREAT RATIO: 22.63 — ABNORMAL HIGH
BUN: 43 — ABNORMAL HIGH
CALCIUM: 9.5
CARBON DIOXIDE: 28
CHLORIDE: 101
CREATININE: 1.9 — ABNORMAL HIGH
ESTIMATED GLOMERULAR FILTRATION RATE: 35
FERRITIN: 97
FOLATE: >24
GLUCOSE,NONFAST: 163 — ABNORMAL HIGH
HCT: 50.5
HEMOGLOBIN A1C: 6.8 — ABNORMAL HIGH
HGB: 16.2
IRON BINDING CAPACITY: 452
MCH: 28.7
MCHC: 32.1 — ABNORMAL LOW
MCV: 89.5
PHOSPHORUS: 3.9
PLATELET COUNT: 222
POTASSIUM: 4
RBC: 5.64
SODIUM: 139
TOTAL PROTEIN: 5.9
WBC: 7.4

## 2016-10-20 ENCOUNTER — Encounter (HOSPITAL_BASED_OUTPATIENT_CLINIC_OR_DEPARTMENT_OTHER): Payer: Self-pay | Admitting: Family

## 2016-10-22 ENCOUNTER — Encounter (INDEPENDENT_AMBULATORY_CARE_PROVIDER_SITE_OTHER): Payer: Medicare Other | Admitting: Physician Assistant

## 2016-10-30 ENCOUNTER — Encounter (INDEPENDENT_AMBULATORY_CARE_PROVIDER_SITE_OTHER): Payer: Self-pay | Admitting: Family

## 2016-11-19 ENCOUNTER — Encounter (INDEPENDENT_AMBULATORY_CARE_PROVIDER_SITE_OTHER): Payer: Medicare Other | Admitting: INTERNAL MEDICINE CARDIOVASCULAR DISEASE

## 2016-11-27 ENCOUNTER — Ambulatory Visit (INDEPENDENT_AMBULATORY_CARE_PROVIDER_SITE_OTHER): Payer: Medicare Other | Admitting: INTERNAL MEDICINE CARDIOVASCULAR DISEASE

## 2016-11-27 ENCOUNTER — Encounter (INDEPENDENT_AMBULATORY_CARE_PROVIDER_SITE_OTHER): Payer: Self-pay | Admitting: INTERNAL MEDICINE CARDIOVASCULAR DISEASE

## 2016-11-27 VITALS — BP 102/69 | HR 70 | Resp 19 | Ht 76.0 in | Wt 187.0 lb

## 2016-11-27 DIAGNOSIS — I2510 Atherosclerotic heart disease of native coronary artery without angina pectoris: Secondary | ICD-10-CM

## 2016-11-27 DIAGNOSIS — I447 Left bundle-branch block, unspecified: Secondary | ICD-10-CM

## 2016-11-27 DIAGNOSIS — I480 Paroxysmal atrial fibrillation: Secondary | ICD-10-CM

## 2016-11-27 DIAGNOSIS — I255 Ischemic cardiomyopathy: Secondary | ICD-10-CM

## 2016-11-27 DIAGNOSIS — R296 Repeated falls: Secondary | ICD-10-CM

## 2016-11-27 NOTE — Progress Notes (Signed)
[facility] AND [facility]  DEPARTMENT OF MEDICINE  [place], [place] [address]    [name] is a 76 y.o. male  With AFib CHADS score 6 HAS BLED 3 with 14 falls in first quarter of this year. He has ischemic heart disease and cardiomyopathy, AICD and is s/p AV node ablation. The major concern at this time is bleeding risk with Eliquis.  He has not had an ischemic workup since his stents.    No Angina    Previous diagnostic testing for coronary artery disease includes cardiac catheterization, echocardiogram, EPS study.    Dyspnea  Evaluation of shortness of breath. Dyspnea has been mild, and generally lasting minutes. Episodes usually occur with exertion and have been stable.    No Palpitations     No Syncope    No Claudication    Edema  He has had a lot of edema and the doctors at [facility] had given him a 'super diuretic' for a few days and his edema is now better    Past Medical History:   Diagnosis Date   . BPH (benign prostatic hypertrophy)    . CAD (coronary artery disease) 09/25/2011   . CHF (NYHA class II, ACC/AHA stage C) (HCC) 10/24/2013   . CVA (cerebrovascular accident) (HCC)    . Dementia    . Diabetes mellitus (HCC)    . Diabetes mellitus, type 2 (HCC)    . HTN (hypertension)    . MI (myocardial infarction)    . Renal stones    . Wears dentures    . Wears glasses          Family Medical History     Problem Relation (Age of Onset)    Cancer Sister    Coronary Artery Disease Other    Diabetes Mother, Sister    High Cholesterol Father    Hypertension Father            Current Outpatient Prescriptions   Medication Sig Dispense Refill   . alum-mag hydroxide-simethicone (MAALOX MAX) 400-400-40 mg/5 mL Oral Suspension Take 15 mL by mouth Four times a day as needed     . ammonium lactate (LAC-HYDRIN) 12 % Lotion Apply topically Twice daily rub in to affected area well     . apixaban (ELIQUIS) 5 mg Oral Tablet Take 5 mg by mouth Twice daily     . aspirin (ECOTRIN) 81 mg Oral Tablet, Delayed  Release (E.C.) Take 81 mg by mouth Once a day     . carvedilol (COREG) 12.5 mg Oral Tablet Take 12.5 mg by mouth Twice daily with food     . Coenzyme Q10 (CO Q-10) 100 mg Oral Capsule Take by mouth Once a day      . digoxin (LANOXIN) 125 mcg Oral Tablet Take 0.125 mg by mouth Once a day     . donepezil (ARICEPT) 5 mg Oral Tablet Take 10 mg by mouth Every night     . folic acid (FOLVITE) 1 mg Oral Tablet Take 1 mg by mouth Once a day     . furosemide (LASIX) 40 mg Oral Tablet Take 40 mg by mouth Once a day      . glipiZIDE (GLUCOTROL) 5 mg Oral Tablet Take 2.5 mg by mouth Twice a day before meals Take 30 minutes before meals      . Iron, Carbonyl 45 mg Oral Tablet Take by mouth Once a day     . losartan (COZAAR) 25 mg Oral  Tablet Take 100 mg by mouth Once a day      . metFORMIN (GLUCOPHAGE) 500 mg Oral Tablet Take 500 mg by mouth Twice daily with food     . multivitamin Oral Tablet Take 1 Tab by mouth Once a day     . nitroglycerin (NITROSTAT) 0.4 mg Sublingual Tablet, Sublingual 1 Tab (0.4 mg total) by Sublingual route Every 5 minutes as needed for Chest pain. for 3 doses over 15 minutes 30 Tab 5   . Omega-3 Fatty Acids-Vitamin E (FISH OIL) 1,000 mg Oral Capsule Take 1,000 mg by mouth Once a day     . ranitidine (ZANTAC) 150 mg Oral Tablet Take 150 mg by mouth Once a day        No current facility-administered medications for this visit.      Allergies   Allergen Reactions   . Lipitor [Atorvastatin]      Concerned about cognitive function.   . Pravastatin Rash     Social History     Social History   . Marital status: Divorced     Spouse name: N/A   . Number of children: N/A   . Years of education: N/A     Occupational History   . Not on file.     Social History Main Topics   . Smoking status: Never Smoker   . Smokeless tobacco: Never Used   . Alcohol use No   . Drug use: No   . Sexual activity: Not on file     Other Topics Concern   . Right Hand Dominant Yes     Social History Narrative       Review of Systems   Constitutional: negative  Eyes: negative for blurry vision  Ears, nose, mouth, throat, and face: negative  Respiratory: negative for cough, sputum, asthma, or wheezing  Cardiovascular: negative for near-syncope, syncope, and claudication  Gastrointestinal: negative for abdominal pain  Genitourinary: negative  Integument/breast: negative  Hematologic/lymphatic: negative for easy bruising  Musculoskeletal: negative for arthralgias or joint stiffness  Neurological: negative for memory problems  Behavioral/Psych: negative for anxiety or depression  Endocrine: negative  Allergic/Immunologic: negative    Objective:     BP 102/69  Pulse 70  Resp 19  Ht 1.93 m (6\' 4" )  Wt 84.8 kg (187 lb)  SpO2 96%  BMI 22.76 kg/m2  General:  Alert, cooperative, no distress, appears stated age.   Oropharynx: Normal.   Neck: Supple. No JVD.   Lung: Clear to auscultation bilaterally.   Heart:  Regular rate and rhythm, S1, S2 normal, no murmur, click, rub or gallop.   Abdomen: Soft, non-tender. Bowel sounds normal. No masses,  no organomegaly.   Extremities: Extremities normal, atraumatic, no cyanosis or edema.   Pulses: Diminished pulses   Skin:  Warm and dry, no hyperpigmentation, vitiligo, or suspicious lesions.   Neuro: Normal without focal findings, mental status normal, speech normal, alert and oriented x3,PERRLA, reflexes normal and symmetric.     Cardiographics  Cath 2013  history of coronary artery disease with stenting of his LAD as well as RCA in 2013    TTE Nov 2017      Most recent AICD check 2017    SJM #[id number]  CRT-D.  Mode=DDD@70  bpm.  Battery status etr=4.0 years.  A: 390 ohms, P=nt, threshold=nt  [output=auto ].  RV: 340 ohms, R= > 12 mV, threshold=nt  [output=2.5V and 0.51ms ].  LV: 550 ohms, threshold=nt  [output=2.5V and 0.66ms ].  Ap=<1% ,  Vp= 97% . AF burden=>99%.  VF detections=200 bpm. No recent vt/vf episodes.  No new alerts.  Normal device check.  Will recheck again in 6 months from home by remote transmission.   Darlyn Chamber, RN  12.28.17        AICD implant [phone]  PLAN:  The device is programmed to provide DDD pacing between 70-120 ppm.  This is currently programmed to be a single zone device detecting ventricular fibrillation as rates exceeding 200 beats per minute and delivering high output therapies.    We will initiate oral amiodarone to attempt to maintain a sinus mechanism.      Jackquline Bosch, MD  Associate Professor, Section of Cardiology  [facility] Department of Medicine    AV Node Ablation 2017  IMPRESSION AND PLAN:  Acutely successful AV node ablation for refractory atrial fibrillation.  The patient will be monitored with bedrest for the next 4 hours and will be discharged to home.  He will return to my office for followup in 6-8 weeks.      Hazle Quant, MD  Assistant Professor  [facility] Department of Medicine, Section of Cardiology            Assessment:     Patient is a 76 y.o. male who presents for follow-up of CAD cardiomyopathy AFib with fall risk    Plan:     1. Refer to structural heart to assess for LA appendage occluder  Update an echocardiogram  2. Follow up: 1 month    Orders Placed This Encounter   . Refer to [facility] Structural Heart   . TRANSTHORACIC ECHOCARDIOGRAM ([place] SATELLITES ONLY)

## 2016-12-01 ENCOUNTER — Telehealth (HOSPITAL_COMMUNITY): Payer: Self-pay

## 2016-12-01 NOTE — Telephone Encounter (Signed)
Called patient regarding NPV LAAO eval ref Karie Mainland, no answer, message left to call Morrie Sheldon from [facility] back at [phone].

## 2016-12-02 ENCOUNTER — Ambulatory Visit (INDEPENDENT_AMBULATORY_CARE_PROVIDER_SITE_OTHER): Payer: Medicare Other | Admitting: Family

## 2016-12-02 VITALS — BP 104/60 | HR 64 | Ht 76.0 in

## 2016-12-02 DIAGNOSIS — E119 Type 2 diabetes mellitus without complications: Secondary | ICD-10-CM

## 2016-12-02 DIAGNOSIS — N183 Chronic kidney disease, stage 3 unspecified (CMS HCC): Secondary | ICD-10-CM

## 2016-12-02 DIAGNOSIS — I129 Hypertensive chronic kidney disease with stage 1 through stage 4 chronic kidney disease, or unspecified chronic kidney disease: Secondary | ICD-10-CM

## 2016-12-02 DIAGNOSIS — I509 Heart failure, unspecified: Secondary | ICD-10-CM

## 2016-12-02 DIAGNOSIS — I10 Essential (primary) hypertension: Secondary | ICD-10-CM

## 2016-12-02 LAB — ENTER/EDIT NEPHROLOGY EXTERNAL LABS
25-HYDROXY VITAMIN D: 85.2
ALBUMIN (SERUM): 3.1
BUN/CREAT RATIO: 16.67
BUN: 35
CALCIUM: 9.2
CARBON DIOXIDE: 30
CARBON DIOXIDE: 31
CHLORIDE: 96
CHLORIDE: 97
CREATININE, UR RAND: 70.2
CREATININE: 2.1
ESTIMATED GLOMERULAR FILTRATION RATE: 31
ESTIMATED GLOMERULAR FILTRATION RATE: 31
GLUCOSE, FASTING: 76
HCT: 41
HGB: 12.8
INTACT PTH: 57.7
PHOSPHORUS: 4.2
PLATELET COUNT: 178
PLATELET COUNT: 178
POTASSIUM: 4.2
POTASSIUM: 4.3
PROTEIN, URINE, RANDOM: 38.1
SODIUM: 137
SODIUM: 138
WBC: 6.4

## 2016-12-03 NOTE — Progress Notes (Signed)
 PATIENT NAME: [name], [name]  HOSPITAL NUMBER:  [id number]  DATE OF SERVICE: 12/02/2016  DATE OF BIRTH:      PROGRESS NOTE    IDENTIFICATION:  This is a 76 year old Caucasian male who is followed in the [facility] for chronic kidney disease stage 3 and baseline creatinine ranging 1.7-1.9, most likely multifactorial etiology with uncontrolled diabetes, hypertension, atherosclerosis, and heart failure.  I last saw him on September 16, 2016, and at that time his most recent creatinine level was 1.7.  He is here today in scheduled followup.    SUBJECTIVE:  The patient presents to clinic today accompanied by his sister.  He tells me he has had quite an eventful past month or so.  He did have some pretty significant edema and went to [facility], was given "super diuretic for a few days."  The edema is much improved and very minimal at this time.  He currently is residing at the [facility] secondary to multiple falls since the first of the year.  He did see his cardiologist most recently.  Blood pressures have been running high and his losartan was increased, but now blood pressure seems to be running pretty low and he "feels very tired."    REVIEW OF SYSTEMS:  He denies chest pain.  He does have shortness of breath with exertion, but feels this is chronic and stable and actually improved.  He does have minimal lower extremity edema at this time.  Appetite is okay.  No nausea or vomiting.  No fever or chills.  No dysuria or hematuria and feels that urine output is adequate with fluid intake.    MEDICATIONS:  Current Outpatient Prescriptions   Medication Sig   . alum-mag hydroxide-simethicone (MAALOX MAX) 400-400-40 mg/5 mL Oral Suspension Take 15 mL by mouth Four times a day as needed   . ammonium lactate (LAC-HYDRIN) 12 % Lotion Apply topically Twice daily rub in to affected area well   . apixaban (ELIQUIS) 5 mg Oral Tablet Take 5 mg by mouth Twice daily   . aspirin (ECOTRIN) 81 mg Oral Tablet,  Delayed Release (E.C.) Take 81 mg by mouth Once a day   . carvedilol (COREG) 12.5 mg Oral Tablet Take 12.5 mg by mouth Twice daily with food   . cholecalciferol, vitamin D3, 1,000 unit Oral Tablet Take 1,000 Units by mouth Once a day   . Coenzyme Q10 (CO Q-10) 100 mg Oral Capsule Take by mouth Once a day    . digoxin (LANOXIN) 125 mcg Oral Tablet Take 0.125 mg by mouth Once a day   . donepezil (ARICEPT) 5 mg Oral Tablet Take 10 mg by mouth Every night   . folic acid (FOLVITE) 1 mg Oral Tablet Take 1 mg by mouth Once a day   . furosemide (LASIX) 40 mg Oral Tablet Take 40 mg by mouth Once a day    . glipiZIDE (GLUCOTROL) 5 mg Oral Tablet Take 2.5 mg by mouth Twice a day before meals Take 30 minutes before meals    . Iron, Carbonyl 45 mg Oral Tablet Take by mouth Once a day   . losartan (COZAAR) 25 mg Oral Tablet Take 100 mg by mouth Once a day    . metFORMIN (GLUCOPHAGE) 500 mg Oral Tablet Take 500 mg by mouth Twice daily with food   . multivitamin Oral Tablet Take 1 Tab by mouth Once a day   . nitroglycerin (NITROSTAT) 0.4 mg Sublingual Tablet, Sublingual 1 Tab (0.4 mg  total) by Sublingual route Every 5 minutes as needed for Chest pain. for 3 doses over 15 minutes   . Omega-3 Fatty Acids-Vitamin E (FISH OIL) 1,000 mg Oral Capsule Take 1,000 mg by mouth Once a day   . ranitidine (ZANTAC) 150 mg Oral Tablet Take 150 mg by mouth Once a day        OBJECTIVE:  [name] is a 76 year old Caucasian male who presents to the [facility] today accompanied by her sister.  He is in a wheelchair.  He is chronically ill in appearance, but in no acute distress.  He is very pleasant and cooperative.      Vitals:    12/02/16 1248   BP: 104/60   Pulse: 64   Height: 1.93 m (6\' 4" )      HEENT:  Head is normocephalic and atraumatic.  Eyes anicteric.  Mucous membranes dry.  Cardiac normal S1, S2.  Regular rate and rhythm.  Pulmonary:  Lungs are clear.  He displays adequate respiratory effort.  Abdomen:  Soft,  nontender.  Skin:  Warm, dry, intact.  Extremities:  He has trace bilateral ankle edema with compression stockings in place.  Psychologic:  Alert and oriented.  Neurological:  No tremors.    LABORATORY DATA:  Lab Results   Component Value Date    BUN 35 11/18/2016    BUN 43 (H) 10/17/2016    BUN 38 08/27/2016    CREATININE 2.1 11/18/2016    CREATININE 1.9 (H) 10/17/2016    CREATININE 1.7 08/27/2016    BUNCRRATIO 16.67 11/18/2016    BUNCRRATIO 22.63 (H) 10/17/2016    BUNCRRATIO 22.35 08/27/2016    GFR 31 11/18/2016    GFR 35 10/17/2016    GFR 39 08/27/2016    SODIUM 138 12/02/2016    SODIUM 137 11/18/2016    SODIUM 139 10/17/2016    POTASSIUM 4.2 12/02/2016    POTASSIUM 4.3 11/18/2016    POTASSIUM 4.0 10/17/2016    CHLORIDE 97 12/02/2016    CHLORIDE 96 11/18/2016    CHLORIDE 101 10/17/2016    CO2 30 12/02/2016    CO2 31 11/18/2016    CO2 28 10/17/2016    ANIONGAP 10.00 10/17/2016    ANIONGAP 11.00 08/27/2016    ANIONGAP 9 04/01/2016    CALCIUM 9.2 11/18/2016    CALCIUM 9.5 10/17/2016    CALCIUM 9.3 08/27/2016    PHOSPHORUS 4.2 11/18/2016    PHOSPHORUS 3.9 10/17/2016    PHOSPHORUS 3.3 04/01/2016    ALBUMIN 3.9 07/04/2014    HGB 12.8 11/18/2016    HGB 16.2 10/17/2016    HGB 11.3 07/20/2016    HCT 41.0 11/18/2016    HCT 50.5 10/17/2016    HCT 35.7 07/20/2016    INTACTPTH 57.7 11/18/2016    INTACTPTH 140 (H) 04/01/2016    INTACTPTH 81.4 11/28/2014    IRON 79 10/17/2016    IRON 37 07/15/2016    IRON 61 04/01/2016    IRONBINDCAP 452 10/17/2016    IRONBINDCAP 343 07/15/2016    IRONBINDCAP 403 04/01/2016    IRONSAT 30 09/19/2015    FERRITIN 97 10/17/2016    FERRITIN 173 07/15/2016    FERRITIN 91 04/01/2016    [id number] 49.0 07/04/2014    HA1C 6.8 (H) 10/17/2016    HA1C 6.8 07/15/2016    HA1C 6.6 (H) 04/01/2016       ASSESSMENT/PLAN:  1. Chronic kidney disease stage 3 with baseline creatinine ranging 1.7-1.9 and most likely multifactorial with uncontrolled  diabetes, hypertension, atherosclerosis, and congestive heart  failure.  His most recent creatinine level slightly above baseline at 2.1.  This is most likely secondary to increased need for diuretics.  I did send an order back to [facility] to have lab work completed again next week.  Also advised on the importance of continued avoidance of NSAIDs and IV contrast dye exposure.  2. Hypertension.  Blood pressure running extremely low at this time and he appears to have some symptomatic hypotension.  I recommended cutting the losartan down to 50 mg daily checks of blood pressure with goals of consistent readings around 130.    3. Edema, minimal and much improved.  We will continue the current dose of the Lasix, also advised on the importance of low sodium intake, as this tends to be an admitted problem with the patient.    DISPOSITION:  He will return to the [facility] in 2 months for re-evaluation with lab work completed prior.  I did send orders back with him to [facility].  The patient was seen independently.        Jerelyn Scott, APRN, FNP-BC  [place] Department of Medicine, Section of Nephrology                 DD:  12/02/2016 13:15:06  DT:  12/03/2016 09:10:15 CW  D#:  [id number]

## 2016-12-17 DIAGNOSIS — I340 Nonrheumatic mitral (valve) insufficiency: Secondary | ICD-10-CM

## 2016-12-18 ENCOUNTER — Other Ambulatory Visit (INDEPENDENT_AMBULATORY_CARE_PROVIDER_SITE_OTHER): Payer: Self-pay

## 2016-12-18 DIAGNOSIS — I2510 Atherosclerotic heart disease of native coronary artery without angina pectoris: Secondary | ICD-10-CM

## 2016-12-18 DIAGNOSIS — I480 Paroxysmal atrial fibrillation: Secondary | ICD-10-CM

## 2016-12-18 DIAGNOSIS — I447 Left bundle-branch block, unspecified: Secondary | ICD-10-CM

## 2016-12-18 DIAGNOSIS — I255 Ischemic cardiomyopathy: Secondary | ICD-10-CM

## 2016-12-23 ENCOUNTER — Ambulatory Visit (INDEPENDENT_AMBULATORY_CARE_PROVIDER_SITE_OTHER): Payer: Self-pay | Admitting: INTERNAL MEDICINE CARDIOVASCULAR DISEASE

## 2016-12-23 ENCOUNTER — Telehealth (INDEPENDENT_AMBULATORY_CARE_PROVIDER_SITE_OTHER): Payer: Self-pay | Admitting: INTERNAL MEDICINE CARDIOVASCULAR DISEASE

## 2016-12-23 NOTE — Telephone Encounter (Signed)
Called to [facility] and left a voicemail stating I was returning her call to give results.

## 2016-12-23 NOTE — Telephone Encounter (Signed)
-----   Message from Nichola Sizer sent at 12/23/2016 11:06 AM EDT -----  Can you please either return a call to Johathan Senger, MPOA, [phone]; or mail [name] results to [address] [place], [place] regarding his test results.    Thanks,  [employer]

## 2016-12-23 NOTE — Telephone Encounter (Signed)
Echo Results Mailed to [employer].

## 2017-01-05 ENCOUNTER — Ambulatory Visit: Payer: Medicare Other | Attending: Cardiovascular Disease | Admitting: Cardiovascular Disease

## 2017-01-05 DIAGNOSIS — I480 Paroxysmal atrial fibrillation: Secondary | ICD-10-CM

## 2017-01-05 DIAGNOSIS — I2510 Atherosclerotic heart disease of native coronary artery without angina pectoris: Secondary | ICD-10-CM

## 2017-01-05 DIAGNOSIS — I447 Left bundle-branch block, unspecified: Secondary | ICD-10-CM | POA: Insufficient documentation

## 2017-01-05 DIAGNOSIS — I482 Chronic atrial fibrillation: Secondary | ICD-10-CM

## 2017-01-05 DIAGNOSIS — I255 Ischemic cardiomyopathy: Secondary | ICD-10-CM

## 2017-01-05 LAB — ECG 12-LEAD
Atrial Rate: 129 {beats}/min
Calculated R Axis: -75 degrees
Calculated T Axis: 114 degrees
QRS Duration: 116 ms
QT Interval: 388 ms
QTC Calculation: 419 ms
Ventricular rate: 70 {beats}/min

## 2017-01-05 NOTE — H&P (Signed)
LAAO History and Physical    [name]        PCP: Nehemiah Settle, MD    Referring: Thornell Sartorius, MD  Primary Cardiologist:    CC: discuss watchman    HPI: [name] is a 76 y.o. male who presents to discuss consideration of left atrial appendage occlusion with the WATCHMAN device.     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 Symptoms, vascular dementia.    Currently taking eliquis for anticoagulation but Dr. Thornell Sartorius referred due to frequent falls.    He is accompanied by his sister who provides all the history as well as his epic chart.  The patient was living with his sister this past July 2016 - April of this year but due to 13 falls she felt she needed help to take care of him so he now lives at [facility].  Since his move to that facility, he has had only 1 fall.  He uses a wheelchair most of the time due to fall risk.  He was quite deconditioned per the sisters description so they opted to cancel a LE angiography to evaluate claudication as well as cancel an EGD to evaluate occult blood loss.  The patient is a bit better but is still deconditioned.  The sister was worried his cognition would deteriorate further if he had procedures requiring sedation.  The patient has had weight loss then gain over the past few months.  The sister reports he was 210 lbs --> 161 lbs ---> 171 lbs.  This is in the setting of diuresis with addition of metolazone for a few days only.    Past Medical History:  Current Outpatient Prescriptions   Medication Sig   . alum-mag hydroxide-simethicone (MAALOX MAX) 400-400-40 mg/5 mL Oral Suspension Take 15 mL by mouth Four  times a day as needed   . ammonium lactate (LAC-HYDRIN) 12 % Lotion Apply topically Twice daily rub in to affected area well   . apixaban (ELIQUIS) 5 mg Oral Tablet Take 5 mg by mouth Twice daily   . aspirin (ECOTRIN) 81 mg Oral Tablet, Delayed Release (E.C.) Take 81 mg by mouth Once a day   . carvedilol (COREG) 12.5 mg Oral Tablet Take 12.5 mg by mouth Twice daily with food   . cholecalciferol, vitamin D3, 1,000 unit Oral Tablet Take 1,000 Units by mouth Once a day   . Coenzyme Q10 (CO Q-10) 100 mg Oral Capsule Take by mouth Once a day    . digoxin (LANOXIN) 125 mcg Oral Tablet Take 0.125 mg by mouth Once a day   . donepezil (ARICEPT) 5 mg Oral Tablet Take 10 mg by mouth Every night   . folic acid (FOLVITE) 1 mg Oral Tablet Take 1 mg by mouth Once a day   . furosemide (LASIX) 40 mg Oral Tablet Take 40 mg by mouth Once a day    . glipiZIDE (GLUCOTROL) 5 mg Oral Tablet Take 2.5 mg by mouth Twice a day before meals Take 30 minutes before meals    . Iron, Carbonyl 45 mg Oral Tablet Take by mouth Once a day   . losartan (  COZAAR) 25 mg Oral Tablet Take 100 mg by mouth Once a day    . metFORMIN (GLUCOPHAGE) 500 mg Oral Tablet Take 500 mg by mouth Twice daily with food   . multivitamin Oral Tablet Take 1 Tab by mouth Once a day   . nitroglycerin (NITROSTAT) 0.4 mg Sublingual Tablet, Sublingual 1 Tab (0.4 mg total) by Sublingual route Every 5 minutes as needed for Chest pain. for 3 doses over 15 minutes   . Omega-3 Fatty Acids-Vitamin E (FISH OIL) 1,000 mg Oral Capsule Take 1,000 mg by mouth Once a day   . ranitidine (ZANTAC) 150 mg Oral Tablet Take 150 mg by mouth Once a day      Past Medical History:   Diagnosis Date   . BPH (benign prostatic hypertrophy)    . CAD (coronary artery disease) 09/25/2011   . CHF (NYHA class II, ACC/AHA stage C) (CMS HCC) 10/24/2013   . CVA (cerebrovascular accident) (CMS HCC)    . Dementia    . Diabetes mellitus (CMS HCC)    . Diabetes mellitus, type 2 (CMS HCC)    . HTN (hypertension)    . MI  (myocardial infarction)    . Renal stones    . Wears dentures    . Wears glasses          Past Surgical History:   Procedure Laterality Date   . CORONARY ARTERY ANGIOPLASTY     . HX ADENOIDECTOMY     . HX APPENDECTOMY     . HX CARDIAC ABLATION     . HX HEART CATHETERIZATION     . HX LITHOTRIPSY     . HX PACEMAKER DEFIBRILLATOR PLACEMENT     . HX TONSILLECTOMY           Family Medical History     Problem Relation (Age of Onset)    Cancer Sister    Coronary Artery Disease Other    Diabetes Mother, Sister    High Cholesterol Father    Hypertension Father            Social History     Social History   . Marital status: Divorced     Spouse name: N/A   . Number of children: N/A   . Years of education: N/A     Social History Main Topics   . Smoking status: Never Smoker   . Smokeless tobacco: Never Used   . Alcohol use No   . Drug use: No   . Sexual activity: Not on file     Other Topics Concern   . Right Hand Dominant Yes     Social History Narrative     Allergies   Allergen Reactions   . Lipitor [Atorvastatin]      Concerned about cognitive function.   . Pravastatin Rash       CHADs2VASC2     CHF: Yes. If yes,  NYHA Class III  LV Dysfunction: Yes  HTN (BP>140/90 x 2 occasions or on current tx): Yes  Age > = 75: Yes 2 points  Age 71-74: No  DM: Yes  Stroke: No  TIA: No    Thromboembolic event (peripheral embolism defined as thromboembolic event outside the brain, heart, eyes, and lungs. Thromboembolic event is defined as either an ischemic stroke, peripheral embolism, or pulmonary embolism.): No    Vascular Disease (The presence of any of the following: intermittent claudication, previous surgery or percutaneous intervention on the abdominal aorta or the lower extremity vessels,  abdominal or thoracic surgery, arterial and venous thrombosis.):yes,  PAD  + MI    Total Score =  6 for age, chf, htn, dm, vascular dz (claudication)      Increased fall risk (Indicate if the patient has an increased susceptibility to falling that  may cause physical harm. Increased risk for falls is defined as the following: 2 or more falls in 12 months; presents with acute fall for this episode of care; or experiences difficulty walking or balancing.) Yes    Clinical relevant bleeding event (Defined as one of the following: Hb drop of >= 3 grams/dL, transfusion of PRBC's, procedural intervention/surgery at the bleeding site to reverse/stop or correct the bleeding, hospital admission with primary discharge dx related to a bleeding event.) Yes GI    Genetic coagulopathy: No     Concurrent anticoagulant therapy at the time of clinically relevant bleeding event: n/a    CAD: Yes as defined by coronary artery stenosis of greater or equal to 50%(by cath or other modality), previous CABG, previous PCI, previous MI.    Valvular AF (Indicate if the patient has AF occurring in the setting of and believed to be, at least in part, directly attributable to valvular heart disease.) No    Hx of rheumatic valve disease: No    Hx of mitral valve replacement: No    Hx of mitral valve repair: No    Atrial fibrillation classification: Permanent-Used when there has been a joint decision by the patient and clinician to cease further attempts to restore and/or maintain sinus rhythm.     Attempt at AF Termination: No AVN ablation not AF ablation    Catheter Ablation (PV isolation with RF or cryoablation): No    Atrial Flutter: No    Attempt at atrial flutter termination (antiarrhythmic drugs, DC CV, or catheter ablation): No    Cardiomyopathy: Yes. If yes,  Ischemic (EF < = 35%)    COPD: Yes    Sleep apnea: No    Sleep apnea recommended treatment followed: No    Cardiac structural intervention (percutaneously or surgically): No    History of left atrial appendage intervention: No    Epicardial approach considered: No    Indicate if the patient has a history of cardiac surgery: No      Anticoagulation/antiplatelet history    Aspirin 81 mg: Past  Aspirin 325 mg: Never  Clopidogrel:  Past  Prasugrel: Never  Ticlodipine: Never  Ticagrelor: Never  Apixaban: Current  Dabigatran: Current  Rivaroxaban: Past  Edoxaban: Never  Warfarin: Never  Bridging therapy with UFH heparin: Never  Bridging therapy with fondaparinux: Never  Bridging therapy with LMW heparin: Never  Bridging therapy with heparin derivative: Never     HAS-BLED    Uncontrolled HTN with SBP ? 160 despite medical therapy: No    Abnormal renal function (History of being the recipient of at least one kidney transplant or chronic dialysis (PD or HD) in the past or a dialysis treatment in the week prior to admission or Cr > 2.6. Chronic is defined as 3 months or greater.) No Cr is 2.1 not > 2.6    Abnormal liver function (Defined as chronic hepatic disease (i.e. cirrhosis), bilirubin > 2x upper limit of normal, AST/ALT > 3x upper limit of normal. Chronic is defined as 3 months or greater. ) No    Stroke: Yes  Hemorrhagic stroke: No (No if hemorrhagic conversion is the consequence of ischemic stroke. Subdural hematoma is an intracranial hemorrhagic event  and not a stroke.)  Ischemic stroke: Yes  Subacute left cerebellar hemispheric lacunar infarct    Undetermined stroke (If patient has experienced an undetermined stroke, as defined by an acute episode of focal or global neurological dysfunction caused by presumed brain, spinal cord, or retinal vascular injury as a result of hemorrhage or infarction but with insufficient information to allow categorization as either an ischemic stroke or hemorrhagic stroke.) No    Bleeding (If the patient has a history of major bleeding event or predisposition to bleeding (i.e. bleeding diathesis, anemia) as defined by HAS BLED risk model. Major bleeding defined as any bleeding requiring hospitalization, and/or causing a decrease in Hb > 2 g/dL and/or requiring blood transfusion that was not a hemorrhagic stroke. Yes heme + stools and cancelled endoscopy    Labile INR (Labile INR is defined as unstable/high  INR or < 60% INR values in therapeutic range defined as 2-3 inclusive.) No    Alcohol (Defined as consuming > = 8 units of alcohol/week.) No    Antiplatelet (Aspirin 325 mg or other antiplatelet medication.) No    NSAIDS (Aspirin 81 mg or other NSAIDS.) Yes    Total score = 3 for bleeding, age        REVIEW OF SYSTEMS:  Constitutional: The patient denies any fevers, chills, sweats, or recent change in weight.  HEENT: The patient denies any blurred vision, difficulty hearing, or dysphagia.  Respiratory: The patient denies any cough or hemoptysis.  Cardiac: As per the history of present illness.  Gastrointestinal: The patient denies any nausea, vomiting, diarrhea, constipation, abdominal pain, hematemesis, hematochezia, melena, or history of peptic ulcer disease.  Genitourinary: The patient denies any dysuria, hematuria, or nocturia.  Musculoskeletal: The patient denies joint pain, joint swelling, or muscle aches.  Neurologic: The patient denies any headaches or paresthesias.  Hematology: Positive for easy bruising and bleeding.    Objective:     Wt 77.3 kg (170 lb 6.7 oz)  BMI 20.74 kg/m2    General: appears age, no distress  HENT: dentition is upper dentures, lower some native teeth  Neck: supple  Cardiac: Regular rate and rhythm (underlying af, paced)  Lungs: Clear to auscultation without wheezes, rales, or rhonchi  Vascular: Pitting edema Not present. Carotid bruit Not present.  Back: Gait is not assessed, not able to walk well indepedently  Neurological: no focal ABN, modified Rankin Score is 2  Skin: Pallor is Not present  Psychiatric: affect is sometimes incongruent    ECG demonstrated the following V paced  Recent Hb 12.8 11/18/2016  Recent Cr 2.1 11/18/2016    Assessment & Plan:     1. Permanent atrial fibrillation       [name] is a 76 y.o. male who has been referred for left atrial appendage occlusion with the WATCHMAN device for a history of fall risk for the benefit of non pharmacologic stroke  prevention based on the results of the PROTECT-AF and PREVAIL studies.   The patient is at increased risk for stroke based on the CHA2DS2VASC score of 6 correlating to a 9.7% risk of stroke per year without oral anticoagulation. and an increased risk of bleeding based on a HAS-BLED score of 3.  correlating to a 5.8% risk of major bleeding event with one year of oral anticoagulation. The risks, benefits and alternatives were discussed with the patient.    The sister was hesitant and asked her brother whether he wanted to proceed.  He said he did not  wish to undergo the procedure as he is falling much less now that he mostly uses a wheelchair.  The patient is appropriate candidate should he change his mind.      The only concern would be the history of occult blood loss and then weight loss without any work up.  There is a family history with his sister and colon cancer, and he typically has colonoscopies every 5 years as do his siblings, and his last was 2-3 years ago.  He was scheduled for EGD and sister as well as he decided to cancel.        Thank you for the referral of this patient.    Dillon Bjork, PA-C  01/05/2017, 13:37      I spent 23 minutes out of 45 minutes counseling him regarding:  Heart anatomy, stroke risk, bleeding risk    The patient was seen and evaluated with Dr. Fredonia Highland.    I have seen and evaluated the patient with the APP and agree with the findings and assessment in the APP's note.  Any exceptions have been noted.  If clinical situation changes will discuss LAAO again with patient.    Blenda Mounts, MD, [facility], [facility]  Assistant Professor of Medicine, Section of Cardiology  [facility]

## 2017-01-07 ENCOUNTER — Encounter (INDEPENDENT_AMBULATORY_CARE_PROVIDER_SITE_OTHER): Payer: Self-pay | Admitting: INTERNAL MEDICINE CARDIOVASCULAR DISEASE

## 2017-01-07 ENCOUNTER — Ambulatory Visit (INDEPENDENT_AMBULATORY_CARE_PROVIDER_SITE_OTHER): Payer: Medicare Other | Admitting: INTERNAL MEDICINE CARDIOVASCULAR DISEASE

## 2017-01-07 VITALS — BP 76/42 | HR 69 | Resp 18 | Ht 72.0 in | Wt 168.8 lb

## 2017-01-07 DIAGNOSIS — Z95810 Presence of automatic (implantable) cardiac defibrillator: Secondary | ICD-10-CM

## 2017-01-07 DIAGNOSIS — I480 Paroxysmal atrial fibrillation: Secondary | ICD-10-CM

## 2017-01-07 DIAGNOSIS — E785 Hyperlipidemia, unspecified: Secondary | ICD-10-CM

## 2017-01-07 DIAGNOSIS — I2510 Atherosclerotic heart disease of native coronary artery without angina pectoris: Secondary | ICD-10-CM

## 2017-01-07 DIAGNOSIS — I509 Heart failure, unspecified: Secondary | ICD-10-CM

## 2017-01-07 DIAGNOSIS — I4891 Unspecified atrial fibrillation: Secondary | ICD-10-CM

## 2017-01-07 NOTE — Progress Notes (Addendum)
[facility]  DEPARTMENT OF MEDICINE  [place], [place] [address]    [name] is a 76 y.o. male  With AFib CHADS score 6 HAS BLED 3 with 14 falls in first quarter of this year. He has ischemic heart disease and cardiomyopathy, AICD and is s/p AV node ablation. The major concern at this time is bleeding risk with Eliquis.  He has not had an ischemic workup since his stents.  He has seen structural heart and in the meantime he is wheelchair bound and falling is no longer an issue  His BP is noted to be low today however he has no symptoms from it    No Angina    Previous diagnostic testing for coronary artery disease includes cardiac catheterization, echocardiogram, EPS study.    Dyspnea  Evaluation of shortness of breath. Dyspnea has been mild, and generally lasting minutes. Episodes usually occur with exertion and have been stable.    No Palpitations     No Syncope    No Claudication    Edema  He has had a lot of edema and the doctors at [facility] general had given him a 'super diuretic' for a few days and his edema is now better    Past Medical History:   Diagnosis Date   . BPH (benign prostatic hypertrophy)    . CAD (coronary artery disease) 09/25/2011   . CHF (NYHA class II, ACC/AHA stage C) (CMS HCC) 10/24/2013   . CVA (cerebrovascular accident) (CMS HCC)    . Dementia    . Diabetes mellitus (CMS HCC)    . Diabetes mellitus, type 2 (CMS HCC)    . HTN (hypertension)    . MI (myocardial infarction)    . Renal stones    . Wears dentures    . Wears glasses          Family Medical History     Problem Relation (Age of Onset)    Cancer Sister    Coronary Artery Disease Other    Diabetes Mother, Sister    High Cholesterol Father    Hypertension Father            Current Outpatient Prescriptions   Medication Sig Dispense Refill   . alum-mag hydroxide-simethicone (MAALOX MAX) 400-400-40 mg/5 mL Oral Suspension Take 15 mL by mouth Four times a day as needed     . ammonium lactate (LAC-HYDRIN)  12 % Lotion Apply topically Twice daily rub in to affected area well     . apixaban (ELIQUIS) 5 mg Oral Tablet Take 5 mg by mouth Twice daily     . aspirin (ECOTRIN) 81 mg Oral Tablet, Delayed Release (E.C.) Take 81 mg by mouth Once a day     . bisacodyl (DULCOLAX, BISACODYL,) 5 mg Oral Tablet, Delayed Release (E.C.) Take 5 mg by mouth Every 24 hours as needed for Constipation     . carvedilol (COREG) 12.5 mg Oral Tablet Take 12.5 mg by mouth Twice daily with food     . cholecalciferol, vitamin D3, 1,000 unit Oral Tablet Take 1,000 Units by mouth Once a day     . Coenzyme Q10 (CO Q-10) 100 mg Oral Capsule Take by mouth Once a day      . digoxin (LANOXIN) 125 mcg Oral Tablet Take 0.125 mg by mouth Once a day     . donepezil (ARICEPT) 5 mg Oral Tablet Take 10 mg by mouth Every night     . Ferrous  Sulfate 142 mg (45 mg iron) Oral Tablet Sustained Release Take by mouth     . folic acid (FOLVITE) 1 mg Oral Tablet Take 1 mg by mouth Once a day     . furosemide (LASIX) 40 mg Oral Tablet Take 40 mg by mouth Once a day      . glipiZIDE (GLUCOTROL) 5 mg Oral Tablet Take 2.5 mg by mouth Twice a day before meals Take 30 minutes before meals      . insulin glargine (LANTUS) 100 unit/mL Subcutaneous injection (vial) 15 Units by Subcutaneous route Every night     . losartan (COZAAR) 25 mg Oral Tablet Take 100 mg by mouth Once a day      . metFORMIN (GLUCOPHAGE) 500 mg Oral Tablet Take 500 mg by mouth Twice daily with food     . multivitamin Oral Tablet Take 1 Tab by mouth Once a day     . nitroglycerin (NITROSTAT) 0.4 mg Sublingual Tablet, Sublingual 1 Tab (0.4 mg total) by Sublingual route Every 5 minutes as needed for Chest pain. for 3 doses over 15 minutes 30 Tab 5   . Omega-3 Fatty Acids-Vitamin E (FISH OIL) 1,000 mg Oral Capsule Take 1,000 mg by mouth Once a day     . ranitidine (ZANTAC) 150 mg Oral Tablet Take 150 mg by mouth Once a day        No current facility-administered medications for this visit.      Allergies    Allergen Reactions   . Lipitor [Atorvastatin]      Concerned about cognitive function.   . Pravastatin Rash     Social History     Social History   . Marital status: Divorced     Spouse name: N/A   . Number of children: N/A   . Years of education: N/A     Occupational History   . Not on file.     Social History Main Topics   . Smoking status: Never Smoker   . Smokeless tobacco: Never Used   . Alcohol use No   . Drug use: No   . Sexual activity: Not on file     Other Topics Concern   . Right Hand Dominant Yes     Social History Narrative       Review of Systems  Constitutional: negative  Eyes: negative for blurry vision  Ears, nose, mouth, throat, and face: negative  Respiratory: negative for cough, sputum, asthma, or wheezing  Cardiovascular: negative for near-syncope, syncope, and claudication  Gastrointestinal: negative for abdominal pain  Genitourinary: negative  Integument/breast: negative  Hematologic/lymphatic: negative for easy bruising  Musculoskeletal: negative for arthralgias or joint stiffness  Neurological: negative for memory problems  Behavioral/Psych: negative for anxiety or depression  Endocrine: negative  Allergic/Immunologic: negative    Objective:     BP (!) 76/42  Pulse 69  Resp 18  Ht 1.829 m (6')  Wt 76.6 kg (168 lb 12.8 oz)  SpO2 100%  BMI 22.89 kg/m2  General:  Alert, cooperative, no distress, appears stated age.   Oropharynx: Normal.   Neck: Supple. No JVD.   Lung: Clear to auscultation bilaterally.   Heart:  Regular rate and rhythm, S1, S2 normal, no murmur, click, rub or gallop.   Abdomen: Soft, non-tender. Bowel sounds normal. No masses,  no organomegaly.   Extremities: Extremities normal, atraumatic, no cyanosis or edema.   Pulses: Diminished pulses   Skin:  Warm and dry, no hyperpigmentation, vitiligo, or suspicious  lesions.   Neuro: Normal without focal findings, mental status normal, speech normal, alert and oriented x3,PERRLA, reflexes normal and symmetric.     Cardiographics   Cath 2013  history of coronary artery disease with stenting of his LAD as well as RCA in 2013    TTE Jun 2018      TTE Nov 2017      Most recent AICD check 2017    SJM #[id number]  CRT-D.  Mode=DDD@70  bpm.  Battery status etr=4.0 years.  A: 390 ohms, P=nt, threshold=nt  [output=auto ].  RV: 340 ohms, R= > 12 mV, threshold=nt  [output=2.5V and 0.40ms ].  LV: 550 ohms, threshold=nt  [output=2.5V and 0.85ms ].  Ap=<1% ,Vp= 97% . AF burden=>99%.  VF detections=200 bpm. No recent vt/vf episodes.  No new alerts.  Normal device check.  Will recheck again in 6 months from home by remote transmission.  Darlyn Chamber, RN  12.28.17        AICD implant [phone]  PLAN:  The device is programmed to provide DDD pacing between 70-120 ppm.  This is currently programmed to be a single zone device detecting ventricular fibrillation as rates exceeding 200 beats per minute and delivering high output therapies.    We will initiate oral amiodarone to attempt to maintain a sinus mechanism.      Jackquline Bosch, MD  Associate Professor, Section of Cardiology  [facility] Department of Medicine    AV Node Ablation 2017  IMPRESSION AND PLAN:  Acutely successful AV node ablation for refractory atrial fibrillation.  The patient will be monitored with bedrest for the next 4 hours and will be discharged to home.  He will return to my office for followup in 6-8 weeks.      Hazle Quant, MD  Assistant Professor  [facility] Department of Medicine, Section of Cardiology            Assessment:     Patient is a 76 y.o. male who presents for follow-up of CAD cardiomyopathy AFib with fall risk. He has seen structural heart however has stopped falling as he is in a wheelchair most of the time now.    Plan:     1. Cardiomyopathy LVEF 25% on recent echo continue coreg  His BP today is low however he has no symptoms will have nursing home record pressures and call me with them at which point we will look into medication adjustment  Will obtain BMP and CBC  He diuresed  40 pounds on a diuretic he took for 3 days prescribed by Dr Verner Mould (he is not longer taking this)  His AICD software was upgraded today and he was instructed on device alerts and to let the nursing home staff know if the device vibrated  2. Follow up: 3 month    No orders of the defined types were placed in this encounter.

## 2017-01-07 NOTE — Nursing Note (Signed)
Meds checked with patients list and confirmed correct pharmacy with patient for e-scribing.     [place], [place]  01/07/2017, 13:22

## 2017-02-26 ENCOUNTER — Encounter (INDEPENDENT_AMBULATORY_CARE_PROVIDER_SITE_OTHER): Payer: Medicare Other | Admitting: Internal Medicine

## 2017-03-03 ENCOUNTER — Ambulatory Visit (INDEPENDENT_AMBULATORY_CARE_PROVIDER_SITE_OTHER): Payer: Medicare Other | Admitting: Family

## 2017-03-03 VITALS — BP 122/88 | HR 86 | Resp 17 | Ht 72.0 in | Wt 180.0 lb

## 2017-03-03 DIAGNOSIS — R609 Edema, unspecified: Secondary | ICD-10-CM

## 2017-03-03 DIAGNOSIS — N183 Chronic kidney disease, stage 3 unspecified (CMS HCC): Secondary | ICD-10-CM

## 2017-03-03 DIAGNOSIS — I129 Hypertensive chronic kidney disease with stage 1 through stage 4 chronic kidney disease, or unspecified chronic kidney disease: Secondary | ICD-10-CM

## 2017-03-03 DIAGNOSIS — I10 Essential (primary) hypertension: Secondary | ICD-10-CM

## 2017-03-03 LAB — ENTER/EDIT NEPHROLOGY EXTERNAL LABS
25-HYDROXY VITAMIN D: 69.8
BUN: 27
CALCIUM: 8.9
CARBON DIOXIDE: 20
CHLORIDE: 105
CREATININE, UR RAND: 110.2
CREATININE: 1.7
CREATININE: 1.7
ESTIMATED GLOMERULAR FILTRATION RATE: 39
HCT: 39.4
HEMOGLOBIN A1C: 7.9
HGB: 13.3
INTACT PTH: 96.7
PHOSPHORUS: 3.8
POTASSIUM: 3.7
PROTEIN, URINE, RANDOM: 59.9
SODIUM: 138

## 2017-03-03 NOTE — Progress Notes (Signed)
 PATIENT NAME: [name], [name]  HOSPITAL NUMBER:  [id number]  DATE OF SERVICE: 03/03/2017  DATE OF BIRTH:      PROGRESS NOTE    IDENTIFICATION:  This is a 76 year old Caucasian male who is followed in the [facility] for chronic kidney disease stage 3 and baseline creatinine ranging 1.7-1.9, most likely multifactorial etiology with uncontrolled diabetes, hypertension, atherosclerosis, and heart failure.  I last saw him on Dec 02, 2016, and at that time his most recent creatinine level was 2.1.  He is here today in scheduled followup.    SUBJECTIVE:  The patient presents to clinic today accompanied by his sister.  He tells me he is doing great.  He is now at [facility] and loves it.  Has been very physically active and dancing.  Several months ago, he had some significant lee and his cardiologist put him on a "super diuretic" for about 3 days.  Swelling improved greatly and hasn't been a problem since. Recently started on Aricept and his memory is improved. Blood pressures have been good.    REVIEW OF SYSTEMS:  He denies chest pain or shortness of breath.  No lower extremity edema at this time.  Appetite is good.  No nausea or vomiting.  No fever or chills.  No dysuria or hematuria and feels that urine output is adequate with fluid intake.    MEDICATIONS:  Current Outpatient Prescriptions   Medication Sig   . alum-mag hydroxide-simethicone (MAALOX MAX) 400-400-40 mg/5 mL Oral Suspension Take 15 mL by mouth Four times a day as needed   . ammonium lactate (GERI-HYDROLAC) 12 % Cream    . ammonium lactate (LAC-HYDRIN) 12 % Lotion Apply topically Twice daily rub in to affected area well   . apixaban (ELIQUIS) 5 mg Oral Tablet Take 5 mg by mouth Twice daily   . aspirin (ECOTRIN) 81 mg Oral Tablet, Delayed Release (E.C.) Take 81 mg by mouth Once a day   . bisacodyl (DULCOLAX, BISACODYL,) 5 mg Oral Tablet, Delayed Release (E.C.) Take 5 mg by mouth Every 24 hours as needed for Constipation   . carvedilol  (COREG) 12.5 mg Oral Tablet Take 12.5 mg by mouth Twice daily with food   . cholecalciferol, vitamin D3, 1,000 unit Oral Tablet Take 1,000 Units by mouth Once a day   . Coenzyme Q10 (CO Q-10) 100 mg Oral Capsule Take by mouth Once a day    . digoxin (LANOXIN) 125 mcg Oral Tablet Take 0.125 mg by mouth Once a day   . donepezil (ARICEPT) 5 mg Oral Tablet Take 10 mg by mouth Every night   . Ferrous Sulfate 142 mg (45 mg iron) Oral Tablet Sustained Release Take by mouth   . folic acid (FOLVITE) 1 mg Oral Tablet Take 1 mg by mouth Once a day   . furosemide (LASIX) 40 mg Oral Tablet Take 40 mg by mouth Once a day    . glipiZIDE (GLUCOTROL) 5 mg Oral Tablet Take 2.5 mg by mouth Twice a day before meals Take 30 minutes before meals    . insulin glargine (LANTUS) 100 unit/mL Subcutaneous injection (vial) 15 Units by Subcutaneous route Every night   . losartan (COZAAR) 25 mg Oral Tablet Take 100 mg by mouth Once a day    . metFORMIN (GLUCOPHAGE) 500 mg Oral Tablet Take 500 mg by mouth Twice daily with food   . multivitamin Oral Tablet Take 1 Tab by mouth Once a day   . nitroglycerin (NITROSTAT) 0.4 mg  Sublingual Tablet, Sublingual 1 Tab (0.4 mg total) by Sublingual route Every 5 minutes as needed for Chest pain. for 3 doses over 15 minutes   . Omega-3 Fatty Acids-Vitamin E (FISH OIL) 1,000 mg Oral Capsule Take 1,000 mg by mouth Once a day   . ranitidine (ZANTAC) 150 mg Oral Tablet Take 150 mg by mouth Once a day        OBJECTIVE:  [name] is a 76 year old Caucasian male who presents to the [facility] today accompanied by his sister.  He ambulatory without difficulty and without the use of assistive devices.  He is well appearing and in no acute distress.  He is very pleasant and cooperative.      Vitals:    03/03/17 0929   BP: 122/88   Pulse: 86   Resp: 17   Weight: 81.6 kg (180 lb)   Height: 1.829 m (6')      HEENT:  Head is normocephalic and atraumatic.  Eyes anicteric.  Mucous membranes dry.   Cardiac normal S1, S2.  Regular rate and rhythm.  Pulmonary:  Lungs are clear.  He displays adequate respiratory effort.  Abdomen:  Soft, nontender.  Skin:  Warm, dry, intact.  Extremities:  He has trace bilateral ankle edema with compression stockings in place.  Psychologic:  Alert and oriented.  Neurological:  No tremors.    LABORATORY DATA:  Lab Results   Component Value Date    BUN 27 02/25/2017    BUN 35 11/18/2016    BUN 43 (H) 10/17/2016    CREATININE 1.7 02/25/2017    CREATININE 2.1 11/18/2016    CREATININE 1.9 (H) 10/17/2016    BUNCRRATIO 16.67 11/18/2016    BUNCRRATIO 22.63 (H) 10/17/2016    BUNCRRATIO 22.35 08/27/2016    GFR 39 02/25/2017    GFR 31 11/18/2016    GFR 35 10/17/2016    SODIUM 138 02/25/2017    SODIUM 138 12/02/2016    SODIUM 137 11/18/2016    POTASSIUM 3.7 02/25/2017    POTASSIUM 4.2 12/02/2016    POTASSIUM 4.3 11/18/2016    CHLORIDE 105 02/25/2017    CHLORIDE 97 12/02/2016    CHLORIDE 96 11/18/2016    CO2 20 02/25/2017    CO2 30 12/02/2016    CO2 31 11/18/2016    ANIONGAP 10.00 10/17/2016    ANIONGAP 11.00 08/27/2016    ANIONGAP 9 04/01/2016    CALCIUM 8.9 02/25/2017    CALCIUM 9.2 11/18/2016    CALCIUM 9.5 10/17/2016    PHOSPHORUS 3.8 02/25/2017    PHOSPHORUS 4.2 11/18/2016    PHOSPHORUS 3.9 10/17/2016    ALBUMIN 3.9 07/04/2014    HGB 13.3 02/25/2017    HGB 12.8 11/18/2016    HGB 16.2 10/17/2016    HCT 39.4 02/25/2017    HCT 41.0 11/18/2016    HCT 50.5 10/17/2016    INTACTPTH 96.7 02/25/2017    INTACTPTH 57.7 11/18/2016    INTACTPTH 140 (H) 04/01/2016    IRON 79 10/17/2016    IRON 37 07/15/2016    IRON 61 04/01/2016    IRONBINDCAP 452 10/17/2016    IRONBINDCAP 343 07/15/2016    IRONBINDCAP 403 04/01/2016    IRONSAT 30 09/19/2015    FERRITIN 97 10/17/2016    FERRITIN 173 07/15/2016    FERRITIN 91 04/01/2016    [id number] 49.0 07/04/2014    HA1C 7.9 02/25/2017    HA1C 6.8 (H) 10/17/2016    HA1C 6.8 07/15/2016  ASSESSMENT/PLAN:  1. Chronic kidney disease stage 3 with baseline creatinine  ranging 1.7-1.9 and most likely multifactorial with uncontrolled diabetes, hypertension, atherosclerosis, and congestive heart failure.  His most recent creatinine level at 1.7, indicates stability.  He was reminded on the importance of continued avoidance of NSAIDs and IV contrast dye exposure.  2. Hypertension.  Blood pressure good.  He will continue current medications.  3. Edema,  much improved.  We will continue the current dose of the Lasix, also advised on the importance of low sodium intake, as this tends to be an admitted problem with the patient.    DISPOSITION:  He will return to the [facility] in 3 months for re-evaluation with lab work completed prior.  I did send orders back with him to [facility].      The patient was seen independently.        Jerelyn Scott, APRN, FNP-BC  [place] Department of Medicine, Section of Nephrology

## 2017-03-09 ENCOUNTER — Encounter (INDEPENDENT_AMBULATORY_CARE_PROVIDER_SITE_OTHER): Payer: Self-pay | Admitting: INTERNAL MEDICINE CARDIOVASCULAR DISEASE

## 2017-03-09 ENCOUNTER — Ambulatory Visit (INDEPENDENT_AMBULATORY_CARE_PROVIDER_SITE_OTHER): Payer: Medicare Other | Admitting: INTERNAL MEDICINE CARDIOVASCULAR DISEASE

## 2017-03-09 ENCOUNTER — Other Ambulatory Visit (INDEPENDENT_AMBULATORY_CARE_PROVIDER_SITE_OTHER): Payer: Self-pay | Admitting: INTERNAL MEDICINE CARDIOVASCULAR DISEASE

## 2017-03-09 VITALS — BP 83/43 | HR 70 | Resp 16 | Ht 72.0 in | Wt 181.6 lb

## 2017-03-09 DIAGNOSIS — I255 Ischemic cardiomyopathy: Secondary | ICD-10-CM

## 2017-03-09 DIAGNOSIS — I447 Left bundle-branch block, unspecified: Secondary | ICD-10-CM

## 2017-03-09 DIAGNOSIS — I959 Hypotension, unspecified: Secondary | ICD-10-CM

## 2017-03-09 DIAGNOSIS — I480 Paroxysmal atrial fibrillation: Secondary | ICD-10-CM

## 2017-03-09 DIAGNOSIS — I4891 Unspecified atrial fibrillation: Secondary | ICD-10-CM

## 2017-03-09 DIAGNOSIS — Z95820 Peripheral vascular angioplasty status with implants and grafts: Secondary | ICD-10-CM

## 2017-03-09 DIAGNOSIS — Z959 Presence of cardiac and vascular implant and graft, unspecified: Secondary | ICD-10-CM

## 2017-03-09 DIAGNOSIS — I2510 Atherosclerotic heart disease of native coronary artery without angina pectoris: Secondary | ICD-10-CM

## 2017-03-09 MED ORDER — CARVEDILOL 6.25 MG TABLET: 6 mg | Tab | Freq: Two times a day (BID) | ORAL | 3 refills | 0 days | Status: DC

## 2017-03-09 MED ORDER — MOMETASONE 50 MCG/ACTUATION NASAL SPRAY
2.0000 | Freq: Every day | NASAL | 3 refills | Status: DC
Start: 2017-03-09 — End: 2017-04-07

## 2017-03-09 NOTE — Progress Notes (Addendum)
[facility]  DEPARTMENT OF MEDICINE  [place], [place] [address]    [name] is a 76 y.o. male  With AFib CHADS score 6 HAS BLED 3 with 14 falls in first quarter of this year. He has ischemic heart disease and cardiomyopathy, AICD and is s/p AV node ablation. The major concern at this time is bleeding risk with Eliquis. Ischemic cardiomyopathy  He has had low BP for quite a while however today he states he is dizzy at times    No Angina    Previous diagnostic testing for coronary artery disease includes cardiac catheterization, echocardiogram, EPS study.    Dyspnea  Evaluation of shortness of breath. Dyspnea has been mild, and generally lasting minutes. Episodes usually occur with exertion and have been stable.    No Palpitations     No Syncope    No Claudication    Edema  He has had a lot of edema and the doctors at [facility] had given him a 'super diuretic' for a few days and his edema is now better    Past Medical History:   Diagnosis Date   . BPH (benign prostatic hypertrophy)    . CAD (coronary artery disease) 09/25/2011   . CHF (NYHA class II, ACC/AHA stage C) (CMS HCC) 10/24/2013   . CVA (cerebrovascular accident) (CMS HCC)    . Dementia    . Diabetes mellitus (CMS HCC)    . Diabetes mellitus, type 2 (CMS HCC)    . HTN (hypertension)    . MI (myocardial infarction)    . Renal stones    . Wears dentures    . Wears glasses          Family Medical History     Problem Relation (Age of Onset)    Cancer Sister    Coronary Artery Disease Other    Diabetes Mother, Sister    High Cholesterol Father    Hypertension Father            Current Outpatient Prescriptions   Medication Sig Dispense Refill   . alum-mag hydroxide-simethicone (MAALOX MAX) 400-400-40 mg/5 mL Oral Suspension Take 15 mL by mouth Four times a day as needed     . ammonium lactate (GERI-HYDROLAC) 12 % Cream      . ammonium lactate (LAC-HYDRIN) 12 % Lotion Apply topically Twice daily rub in to affected area well      . apixaban (ELIQUIS) 5 mg Oral Tablet Take 5 mg by mouth Twice daily     . aspirin (ECOTRIN) 81 mg Oral Tablet, Delayed Release (E.C.) Take 81 mg by mouth Once a day     . bacitracin zinc (ANTIBIOTIC, BACITRACIN ZINC,) 500 unit/gram Ointment by Apply Topically route Twice per day as needed     . bisacodyl (DULCOLAX, BISACODYL,) 5 mg Oral Tablet, Delayed Release (E.C.) Take 5 mg by mouth Every 24 hours as needed for Constipation     . carvedilol (COREG) 12.5 mg Oral Tablet Take 12.5 mg by mouth Twice daily with food     . cholecalciferol, vitamin D3, 1,000 unit Oral Tablet Take 1,000 Units by mouth Once a day     . Coenzyme Q10 (CO Q-10) 100 mg Oral Capsule Take by mouth Once a day      . digoxin (LANOXIN) 125 mcg Oral Tablet Take 0.125 mg by mouth Once a day     . donepezil (ARICEPT) 5 mg Oral Tablet Take 10 mg by mouth Every  night     . Ferrous Sulfate 142 mg (45 mg iron) Oral Tablet Sustained Release Take by mouth     . folic acid (FOLVITE) 1 mg Oral Tablet Take 1 mg by mouth Once a day     . furosemide (LASIX) 40 mg Oral Tablet Take 40 mg by mouth Twice daily      . insulin glargine (LANTUS) 100 unit/mL Subcutaneous injection (vial) 15 Units by Subcutaneous route Every night     . losartan (COZAAR) 25 mg Oral Tablet Take 100 mg by mouth Once a day      . metFORMIN (GLUCOPHAGE) 500 mg Oral Tablet Take 1,000 mg by mouth Twice daily with food      . multivitamin Oral Tablet Take 1 Tab by mouth Once a day     . nitroglycerin (NITROSTAT) 0.4 mg Sublingual Tablet, Sublingual 1 Tab (0.4 mg total) by Sublingual route Every 5 minutes as needed for Chest pain. for 3 doses over 15 minutes 30 Tab 5   . Omega-3 Fatty Acids-Vitamin E (FISH OIL) 1,000 mg Oral Capsule Take 1,000 mg by mouth Once a day     . ranitidine (ZANTAC) 150 mg Oral Tablet Take 150 mg by mouth Once a day        No current facility-administered medications for this visit.      Allergies   Allergen Reactions   . Lipitor [Atorvastatin]      Concerned about  cognitive function.   . Pravastatin Rash     Social History     Social History   . Marital status: Divorced     Spouse name: N/A   . Number of children: N/A   . Years of education: N/A     Occupational History   . Not on file.     Social History Main Topics   . Smoking status: Never Smoker   . Smokeless tobacco: Never Used   . Alcohol use No   . Drug use: No   . Sexual activity: Not on file     Other Topics Concern   . Right Hand Dominant Yes     Social History Narrative       Review of Systems  Constitutional: negative  Eyes: negative for blurry vision  Ears, nose, mouth, throat, and face: negative his ears plug up from time to time  Respiratory: negative for cough, sputum, asthma, or wheezing  Cardiovascular: negative for near-syncope, syncope, and claudication  Gastrointestinal: negative for abdominal pain  Genitourinary: negative  Integument/breast: negative  Hematologic/lymphatic: negative for easy bruising  Musculoskeletal: negative for arthralgias or joint stiffness  Neurological: negative for memory problems  Behavioral/Psych: negative for anxiety or depression  Endocrine: negative  Allergic/Immunologic: negative    Objective:     BP (!) 83/43  Pulse 70  Resp 16  Ht 1.829 m (6')  Wt 82.4 kg (181 lb 9.6 oz)  SpO2 94%  BMI 24.63 kg/m2  General:  Alert, cooperative, no distress, appears stated age.   Oropharynx: Normal.   Neck: Supple. No JVD.   Lung: Clear to auscultation bilaterally.   Heart:  Regular rate and rhythm, S1, S2 normal, no murmur, click, rub or gallop.   Abdomen: Soft, non-tender. Bowel sounds normal. No masses,  no organomegaly.   Extremities: Extremities normal, atraumatic, no cyanosis or edema.   Pulses: Diminished pulses   Skin:  Warm and dry, no hyperpigmentation, vitiligo, or suspicious lesions.   Neuro: Normal without focal findings, mental status normal, speech  normal, alert and oriented x3,PERRLA, reflexes normal and symmetric.     Cardiographics  Cath 2013  history of coronary  artery disease with stenting of his LAD as well as RCA in 2013    TTE Jun 2018      TTE Nov 2017      Most recent AICD check 2017    SJM #[id number]  CRT-D.  Mode=DDD@70  bpm.  Battery status etr=4.0 years.  A: 390 ohms, P=nt, threshold=nt  [output=auto ].  RV: 340 ohms, R= > 12 mV, threshold=nt  [output=2.5V and 0.87ms ].  LV: 550 ohms, threshold=nt  [output=2.5V and 0.39ms ].  Ap=<1% ,Vp= 97% . AF burden=>99%.  VF detections=200 bpm. No recent vt/vf episodes.  No new alerts.  Normal device check.  Will recheck again in 6 months from home by remote transmission.  Darlyn Chamber, RN  12.28.17        AICD implant [phone]  PLAN:  The device is programmed to provide DDD pacing between 70-120 ppm.  This is currently programmed to be a single zone device detecting ventricular fibrillation as rates exceeding 200 beats per minute and delivering high output therapies.    We will initiate oral amiodarone to attempt to maintain a sinus mechanism.      Jackquline Bosch, MD  Associate Professor, Section of Cardiology  [facility] Department of Medicine    AV Node Ablation 2017  IMPRESSION AND PLAN:  Acutely successful AV node ablation for refractory atrial fibrillation.  The patient will be monitored with bedrest for the next 4 hours and will be discharged to home.  He will return to my office for followup in 6-8 weeks.      Hazle Quant, MD  Assistant Professor  [facility] Department of Medicine, Section of Cardiology            Assessment:     Patient is a 76 y.o. male who presents for follow-up of CAD cardiomyopathy AFib with fall risk. He has seen structural heart however has stopped falling as he is in a wheelchair most of the time now.    Plan:     1. Cardiomyopathy LVEF 25% on recent echo continue coreg at 6.25 bid due to symptomatic hypotension  Update his MPS  2. Follow up: 3 month    Orders Placed This Encounter   . LEXISCAN MPS SPECT/REST& STRESS/CARDIOLITE([place] SATELLITES)   . carvedilol (COREG) 6.25 mg Oral Tablet   .  mometasone (NASONEX) 50 mcg/actuation Nasal Spray, Non-Aerosol

## 2017-03-09 NOTE — Nursing Note (Signed)
Meds checked with patients list and confirmed correct pharmacy with patient for e-scribing.     [place], [place]  03/09/2017, 13:58

## 2017-03-09 NOTE — Addendum Note (Signed)
Addended byThornell Sartorius on: 03/09/2017 02:13 PM     Modules accepted: Orders

## 2017-04-06 ENCOUNTER — Other Ambulatory Visit (INDEPENDENT_AMBULATORY_CARE_PROVIDER_SITE_OTHER): Payer: Self-pay

## 2017-04-06 DIAGNOSIS — I2510 Atherosclerotic heart disease of native coronary artery without angina pectoris: Secondary | ICD-10-CM

## 2017-04-06 DIAGNOSIS — I480 Paroxysmal atrial fibrillation: Secondary | ICD-10-CM

## 2017-04-06 DIAGNOSIS — I4891 Unspecified atrial fibrillation: Secondary | ICD-10-CM

## 2017-04-06 DIAGNOSIS — I447 Left bundle-branch block, unspecified: Secondary | ICD-10-CM

## 2017-04-06 DIAGNOSIS — Z95820 Peripheral vascular angioplasty status with implants and grafts: Secondary | ICD-10-CM

## 2017-04-06 DIAGNOSIS — I255 Ischemic cardiomyopathy: Secondary | ICD-10-CM

## 2017-04-06 DIAGNOSIS — I959 Hypotension, unspecified: Secondary | ICD-10-CM

## 2017-04-07 ENCOUNTER — Encounter (INDEPENDENT_AMBULATORY_CARE_PROVIDER_SITE_OTHER): Payer: Self-pay | Admitting: INTERNAL MEDICINE CARDIOVASCULAR DISEASE

## 2017-04-07 ENCOUNTER — Other Ambulatory Visit (INDEPENDENT_AMBULATORY_CARE_PROVIDER_SITE_OTHER): Payer: Self-pay | Admitting: INTERNAL MEDICINE CARDIOVASCULAR DISEASE

## 2017-04-07 ENCOUNTER — Ambulatory Visit (INDEPENDENT_AMBULATORY_CARE_PROVIDER_SITE_OTHER): Payer: Medicare Other | Admitting: INTERNAL MEDICINE CARDIOVASCULAR DISEASE

## 2017-04-07 VITALS — BP 124/70 | HR 70 | Resp 16 | Ht 72.0 in | Wt 189.1 lb

## 2017-04-07 DIAGNOSIS — I10 Essential (primary) hypertension: Secondary | ICD-10-CM

## 2017-04-07 DIAGNOSIS — I4891 Unspecified atrial fibrillation: Secondary | ICD-10-CM

## 2017-04-07 DIAGNOSIS — I5030 Unspecified diastolic (congestive) heart failure: Secondary | ICD-10-CM

## 2017-04-07 DIAGNOSIS — I480 Paroxysmal atrial fibrillation: Secondary | ICD-10-CM

## 2017-04-07 DIAGNOSIS — I255 Ischemic cardiomyopathy: Secondary | ICD-10-CM

## 2017-04-07 DIAGNOSIS — E782 Mixed hyperlipidemia: Secondary | ICD-10-CM

## 2017-04-07 DIAGNOSIS — I447 Left bundle-branch block, unspecified: Secondary | ICD-10-CM

## 2017-04-07 DIAGNOSIS — I2510 Atherosclerotic heart disease of native coronary artery without angina pectoris: Secondary | ICD-10-CM

## 2017-04-07 MED ORDER — METOLAZONE 5 MG TABLET
5.0000 mg | ORAL_TABLET | Freq: Every day | ORAL | 0 refills | Status: AC
Start: 2017-04-07 — End: 2017-04-14

## 2017-04-07 MED ORDER — MOMETASONE 50 MCG/ACTUATION NASAL SPRAY
2.0000 | Freq: Every day | NASAL | 3 refills | Status: DC
Start: 2017-04-07 — End: 2017-05-06

## 2017-04-07 NOTE — Progress Notes (Addendum)
[facility]  DEPARTMENT OF MEDICINE  [place], [place] [address]    [name] is a 76 y.o. male  With AFib CHADS score 6 HAS BLED 3 with 14 falls in first quarter of this year. He has ischemic heart disease and cardiomyopathy, AICD and is s/p AV node ablation.  I he underwent an MPS to assess his CAD status post stents and was found to have inferior wall partially reversible defect of the low EF.  Patient complains of sinus congestion and in a year issues.  His dizziness has improved somewhat.  He was given a prescription of Nasonex the last visit however he is uncertain whether or not this was used.    No Angina    Previous diagnostic testing for coronary artery disease includes cardiac catheterization, echocardiogram, EPS study.    Dyspnea  Evaluation of shortness of breath. Dyspnea has been mild, and generally lasting minutes. Episodes usually occur with exertion and have been stable.    No Palpitations     No Syncope    No Claudication    Edema  He has had a lot of edema and the doctors at [facility] general had given him a 'super diuretic' for a few days and his edema is now better    Past Medical History:   Diagnosis Date   . BPH (benign prostatic hypertrophy)    . CAD (coronary artery disease) 09/25/2011   . CHF (NYHA class II, ACC/AHA stage C) (CMS HCC) 10/24/2013   . CVA (cerebrovascular accident) (CMS HCC)    . Dementia    . Diabetes mellitus (CMS HCC)    . Diabetes mellitus, type 2 (CMS HCC)    . HTN (hypertension)    . MI (myocardial infarction)    . Renal stones    . Wears dentures    . Wears glasses          Family Medical History     Problem Relation (Age of Onset)    Cancer Sister    Coronary Artery Disease Other    Diabetes Mother, Sister    High Cholesterol Father    Hypertension Father            Current Outpatient Prescriptions   Medication Sig Dispense Refill   . alum-mag hydroxide-simethicone (MAALOX MAX) 400-400-40 mg/5 mL Oral Suspension Take 15 mL by mouth Four  times a day as needed     . ammonium lactate (GERI-HYDROLAC) 12 % Cream      . ammonium lactate (LAC-HYDRIN) 12 % Lotion Apply topically Twice daily rub in to affected area well     . apixaban (ELIQUIS) 5 mg Oral Tablet Take 5 mg by mouth Twice daily     . aspirin (ECOTRIN) 81 mg Oral Tablet, Delayed Release (E.C.) Take 81 mg by mouth Once a day     . bacitracin zinc (ANTIBIOTIC, BACITRACIN ZINC,) 500 unit/gram Ointment by Apply Topically route Twice per day as needed     . bisacodyl (DULCOLAX, BISACODYL,) 5 mg Oral Tablet, Delayed Release (E.C.) Take 5 mg by mouth Every 24 hours as needed for Constipation     . carvedilol (COREG) 6.25 mg Oral Tablet Take 1 Tab (6.25 mg total) by mouth Twice daily with food 60 Tab 3   . cholecalciferol, vitamin D3, 1,000 unit Oral Tablet Take 1,000 Units by mouth Once a day     . Coenzyme Q10 (CO Q-10) 100 mg Oral Capsule Take by mouth Once a day      .  digoxin (LANOXIN) 125 mcg Oral Tablet Take 0.125 mg by mouth Once a day     . donepezil (ARICEPT) 10 mg Oral Tablet Take 10 mg by mouth Every night     . Ferrous Sulfate 142 mg (45 mg iron) Oral Tablet Sustained Release Take by mouth     . folic acid (FOLVITE) 1 mg Oral Tablet Take 1 mg by mouth Once a day     . furosemide (LASIX) 40 mg Oral Tablet Take 40 mg by mouth Twice daily      . insulin glargine (LANTUS) 100 unit/mL Subcutaneous injection (vial) 15 Units by Subcutaneous route Every night     . losartan (COZAAR) 25 mg Oral Tablet Take 100 mg by mouth Once a day      . metFORMIN (GLUCOPHAGE) 500 mg Oral Tablet Take 500 mg by mouth Twice daily with food      . mometasone (NASONEX) 50 mcg/actuation Nasal Spray, Non-Aerosol 2 Sprays by Nasal route Once a day 1 Bottle 3   . multivitamin Oral Tablet Take 1 Tab by mouth Once a day     . nitroglycerin (NITROSTAT) 0.4 mg Sublingual Tablet, Sublingual 1 Tab (0.4 mg total) by Sublingual route Every 5 minutes as needed for Chest pain. for 3 doses over 15 minutes 30 Tab 5   . Omega-3 Fatty  Acids-Vitamin E (FISH OIL) 1,000 mg Oral Capsule Take 1,000 mg by mouth Once a day     . ranitidine (ZANTAC) 150 mg Oral Tablet Take 150 mg by mouth Once a day        No current facility-administered medications for this visit.      Allergies   Allergen Reactions   . Lipitor [Atorvastatin]      Concerned about cognitive function.   . Pravastatin Rash     Social History     Social History   . Marital status: Divorced     Spouse name: N/A   . Number of children: N/A   . Years of education: N/A     Occupational History   . Not on file.     Social History Main Topics   . Smoking status: Never Smoker   . Smokeless tobacco: Never Used   . Alcohol use No   . Drug use: No   . Sexual activity: Not on file     Other Topics Concern   . Right Hand Dominant Yes     Social History Narrative       Review of Systems  Constitutional: negative  Eyes: negative for blurry vision  Ears, nose, mouth, throat, and face: negative his ears plug up from time to time  Respiratory: negative for cough, sputum, asthma, or wheezing  Cardiovascular: negative for near-syncope, syncope, and claudication  Gastrointestinal: negative for abdominal pain  Genitourinary: negative  Integument/breast: negative  Hematologic/lymphatic: negative for easy bruising  Musculoskeletal: negative for arthralgias or joint stiffness  Neurological: negative for memory problems  Behavioral/Psych: negative for anxiety or depression  Endocrine: negative  Allergic/Immunologic: negative    Objective:     There were no vitals taken for this visit.  General:  Alert, cooperative, no distress, appears stated age.   Oropharynx: Normal.   Neck: Supple. No JVD.   Lung: Clear to auscultation bilaterally.   Heart:  Regular rate and rhythm, S1, S2 normal, no murmur, click, rub or gallop.   Abdomen: Soft, non-tender. Bowel sounds normal. No masses,  no organomegaly.   Extremities: Extremities normal, atraumatic, no cyanosis  or edema.   Pulses: Diminished pulses   Skin:  Warm and dry, no  hyperpigmentation, vitiligo, or suspicious lesions.   Neuro: Normal without focal findings, mental status normal, speech normal, alert and oriented x3,PERRLA, reflexes normal and symmetric.     Cardiographics  MPS 03/2017    Cath 2013  history of coronary artery disease with stenting of his LAD as well as RCA in 2013    TTE Jun 2018      TTE Nov 2017      Most recent AICD check 2017    SJM #[id number]  CRT-D.  Mode=DDD@70  bpm.  Battery status etr=4.0 years.  A: 390 ohms, P=nt, threshold=nt  [output=auto ].  RV: 340 ohms, R= > 12 mV, threshold=nt  [output=2.5V and 0.18ms ].  LV: 550 ohms, threshold=nt  [output=2.5V and 0.35ms ].  Ap=<1% ,Vp= 97% . AF burden=>99%.  VF detections=200 bpm. No recent vt/vf episodes.  No new alerts.  Normal device check.  Will recheck again in 6 months from home by remote transmission.  Darlyn Chamber, RN  12.28.17        AICD implant [phone]  PLAN:  The device is programmed to provide DDD pacing between 70-120 ppm.  This is currently programmed to be a single zone device detecting ventricular fibrillation as rates exceeding 200 beats per minute and delivering high output therapies.    We will initiate oral amiodarone to attempt to maintain a sinus mechanism.      Jackquline Bosch, MD  Associate Professor, Section of Cardiology  [facility] Department of Medicine    AV Node Ablation 2017  IMPRESSION AND PLAN:  Acutely successful AV node ablation for refractory atrial fibrillation.  The patient will be monitored with bedrest for the next 4 hours and will be discharged to home.  He will return to my office for followup in 6-8 weeks.      Hazle Quant, MD  Assistant Professor  [facility] Department of Medicine, Section of Cardiology            Assessment:     Patient is a 76 y.o. male who presents for follow-up of CAD cardiomyopathy AFib with fall risk. He has seen structural heart however has stopped falling as he is in a wheelchair most of the time now.    Plan:   (I48.91) Atrial fibrillation (CMS  HCC)  (primary encounter diagnosis)  Continue Eliquis    (I25.5) Ischemic cardiomyopathy (I25.10) CAD (coronary artery disease) (I44.7) LBBB (left bundle branch block)  Patient indicates that there has been some weight gain and swelling of the feet and on exam there is 1+ pitting edema today, I had is noted that in the past adding metolazone had made him diuresed quite a bit, we will add metolazone for about a week check BMP and then discontinue metolazone.  Patient should continue Lasix at the same dosage.  Patient's MPS is abnormal in the setting of significant LV dysfunction with a prior history of stenting of the procedure diagnostic right and left heart catheterization.  Patient notes that on the previous episode he had paradoxic agitation with use of sedatives and had lipids IVs out and walked out of the hospital.  This time it plan on doing a groin approach and  Avoid sedation altogether.  Plan: BASIC METABOLIC PANEL, FASTING    (I10) Essential hypertension  Plan: BASIC METABOLIC PANEL, FASTING    (E78.2) Mixed hyperlipidemia  Goal LDL less than 70    Follow-up in a month.  Orders Placed This Encounter   . BASIC METABOLIC PANEL, FASTING   . metOLazone (ZAROXOLYN) 5 mg Oral Tablet   . mometasone (NASONEX) 50 mcg/actuation Nasal Spray, Non-Aerosol

## 2017-04-07 NOTE — Patient Instructions (Signed)
Having Cardiac Catheterization  You may have had chest pain (angina), dizziness, or other symptoms of heart trouble. To help diagnose your problem, your healthcare providermay advise a cardiac catheterization. This is a procedure that looks for a blockage or narrow area in the arteries around the heart.These can cause chest pain or a heart attack if not treated.  This common procedure may also be used to treat a heart problem. It may be done as a planned procedure if you have had chest pain in the past. Or it may be done right away to treat a suspected heart attack.     The catheter may be placed in the arm or the groin.   Before the procedure   Tell your healthcare team what medicines you take and about any allergies you have.   Don't eat or drink anything after midnight the night before the procedure, or as instructed by your healthcare team.  During the procedure   Hair may be trimmed where the catheter will be inserted.   You may be given medicine to relax before the procedure.   You will receive a local anesthetic to prevent pain at the insertion site.   A healthcare provider inserts a tube called a sheath into a blood vessel in your groin or arm.   Through the sheath, a long, thin tube called a catheter is placed inside the artery. The catheter is then guided toward your heart.   To do different tests or check other parts of the heart, the healthcare provider inserts a new catheter or moves the catheter or X-ray machine. For some tests, a contrast dye is injected through the catheter.  After the procedure   Your healthcare providers will tell you how long to lie down and keep the insertion site still.   If the insertion site was in your groin, you may need to lie down with your leg still for 2 or more hours. If a suture or closure device such as a collagen plugis used on the artery site to close the site, you may be able to move sooner. This depends on any bleeding that occurs.   A nurse will  check the insertion site and your blood pressure.   You may be asked to drink fluid to help flush the contrast liquid out of your system.   Have someone drive you home from the hospital.   It's normal to find a small bruise or lump at the insertion site. This should go away within a few weeks.  When to call your healthcare provider  Call your healthcare providerright away if you have any of the following:   Chest pain (angina)   Pain, swelling, redness, bleeding, or fluid leaking at the insertion site   Severe pain, coldness, or a bluish color in the leg or arm that held the catheter   Blood in your urine, black or sticky stools, or any other kind of bleeding   Fever of 100.4F(38.0C) or higher, or as advised by your healthcare provider   Date Last Reviewed: 05/15/2015   2000-2018 [employer] [address], [place] [address]. All rights reserved. This information is not intended as a substitute for professional medical care. Always follow your healthcare professional's instructions.

## 2017-04-07 NOTE — Nursing Note (Signed)
Meds checked with patients list and confirmed correct pharmacy with patient for e-scribing.     [place], [place]  04/07/2017, 14:18

## 2017-04-07 NOTE — Nursing Note (Signed)
Patient had appointment today with Dr. Karie Mainland and agreed to have a heart cath. Patient was scheduled for May 15, 2017 @ 8 AM. Patient will have a follow up appointment in 4 weeks to sign cath consent, have blood work, and go over patient instructions. The patient and family member verbalized understanding of all instructions given and denies any questions or concerns at this time.

## 2017-04-08 ENCOUNTER — Encounter (INDEPENDENT_AMBULATORY_CARE_PROVIDER_SITE_OTHER): Payer: Self-pay | Admitting: INTERNAL MEDICINE CARDIOVASCULAR DISEASE

## 2017-04-10 ENCOUNTER — Ambulatory Visit: Payer: Medicare Other | Attending: Internal Medicine

## 2017-04-10 DIAGNOSIS — I255 Ischemic cardiomyopathy: Secondary | ICD-10-CM

## 2017-04-10 DIAGNOSIS — Z95810 Presence of automatic (implantable) cardiac defibrillator: Secondary | ICD-10-CM

## 2017-04-10 DIAGNOSIS — I447 Left bundle-branch block, unspecified: Secondary | ICD-10-CM | POA: Insufficient documentation

## 2017-04-10 DIAGNOSIS — I4891 Unspecified atrial fibrillation: Secondary | ICD-10-CM | POA: Insufficient documentation

## 2017-04-10 DIAGNOSIS — I5020 Unspecified systolic (congestive) heart failure: Secondary | ICD-10-CM | POA: Insufficient documentation

## 2017-04-10 DIAGNOSIS — Z4502 Encounter for adjustment and management of automatic implantable cardiac defibrillator: Secondary | ICD-10-CM | POA: Insufficient documentation

## 2017-04-10 NOTE — Procedures (Signed)
Patient's ICD was checked by remote transmission.    Patient's ICD was evaluated, typically including programmed   parameters, lead(s), battery, capture, sensing function(s), presence or   absence of therapies, and underlying heart rhythm. Often but not always   this includes sensor rate response, lower and upper heart rates, AV   intervals, pacing voltage and pulse duration, sensing value, and   diagnostics.  Please see programmer printout for details (scanned document).    SJM #[id number]  CRT-D.  Mode=DDD@70  bpm.  Battery status etr=2.4-3.6 years.  A: 410 ohms, P=nt, threshold=nt  [output=auto ].  RV: 350 ohms, R= 10.9 mV, threshold=nt  [output=2.5V and 0.54ms ].  LV: 610 ohms, threshold=nt  [output=2.5V and 0.51ms ].  Ap=<1% ,Vp= 86% .  MS=2.9%,  AF burden=>99%.  VF detections=200 bpm. No recent vt/vf episodes.  No new alerts.  Normal device check.  Will recheck again in 6 months from home by remote transmission.  Mariana Arn, RN 04/10/2017      PACEMAKER OR DEFIBRILLATOR EVALUATION    Order:  See patient's medical record.    Procedure Description:  Standard programming evaluation using pacemaker programmer or standard analysis of remote monitoring.    Data recording:  See printouts, where available, for details.    Physician Analysis and Reporting:  Unremarkable device analysis.  No major abnormalities, except as otherwise noted, if applicable.    Plan:  Plan routine follow-up, as appropriate, or unless otherwise indicated.  Please see notes and/or scanned printouts (where available) for details.    Hazle Quant, MD  Cardiac Electrophysiology  [facility]

## 2017-04-15 ENCOUNTER — Other Ambulatory Visit (INDEPENDENT_AMBULATORY_CARE_PROVIDER_SITE_OTHER): Payer: Self-pay

## 2017-04-15 DIAGNOSIS — G5700 Lesion of sciatic nerve, unspecified lower limb: Secondary | ICD-10-CM

## 2017-04-15 DIAGNOSIS — N189 Chronic kidney disease, unspecified: Secondary | ICD-10-CM

## 2017-04-15 DIAGNOSIS — E539 Vitamin B deficiency, unspecified: Secondary | ICD-10-CM

## 2017-04-15 DIAGNOSIS — E785 Hyperlipidemia, unspecified: Secondary | ICD-10-CM

## 2017-04-15 DIAGNOSIS — D649 Anemia, unspecified: Secondary | ICD-10-CM

## 2017-04-15 DIAGNOSIS — K635 Polyp of colon: Secondary | ICD-10-CM

## 2017-05-06 ENCOUNTER — Ambulatory Visit (INDEPENDENT_AMBULATORY_CARE_PROVIDER_SITE_OTHER): Payer: Medicare Other | Admitting: INTERNAL MEDICINE CARDIOVASCULAR DISEASE

## 2017-05-06 ENCOUNTER — Other Ambulatory Visit (INDEPENDENT_AMBULATORY_CARE_PROVIDER_SITE_OTHER): Payer: Self-pay | Admitting: Internal Medicine

## 2017-05-06 ENCOUNTER — Encounter (INDEPENDENT_AMBULATORY_CARE_PROVIDER_SITE_OTHER): Payer: Self-pay | Admitting: INTERNAL MEDICINE CARDIOVASCULAR DISEASE

## 2017-05-06 DIAGNOSIS — Z95810 Presence of automatic (implantable) cardiac defibrillator: Secondary | ICD-10-CM

## 2017-05-06 DIAGNOSIS — I255 Ischemic cardiomyopathy: Secondary | ICD-10-CM

## 2017-05-06 DIAGNOSIS — I2510 Atherosclerotic heart disease of native coronary artery without angina pectoris: Secondary | ICD-10-CM

## 2017-05-06 DIAGNOSIS — E785 Hyperlipidemia, unspecified: Secondary | ICD-10-CM

## 2017-05-06 DIAGNOSIS — I5030 Unspecified diastolic (congestive) heart failure: Secondary | ICD-10-CM

## 2017-05-06 DIAGNOSIS — I4891 Unspecified atrial fibrillation: Secondary | ICD-10-CM

## 2017-05-06 DIAGNOSIS — E782 Mixed hyperlipidemia: Secondary | ICD-10-CM

## 2017-05-06 DIAGNOSIS — I10 Essential (primary) hypertension: Secondary | ICD-10-CM

## 2017-05-06 DIAGNOSIS — I447 Left bundle-branch block, unspecified: Secondary | ICD-10-CM

## 2017-05-06 NOTE — Progress Notes (Addendum)
[facility] AND [facility]  DEPARTMENT OF MEDICINE  [place], [place] [address]    [name] is a 76 y.o. male  With AFib CHADS score 6 HAS BLED 3 ischemic cardiomyopathy ejection fraction 20-25% with a partially reversible defect, he has an AICD and status post AV node ablation.  He was in euvolemic state for a long time and then lately has noticed more swelling of his feet.  His diuretics were up titrated and his creatinine came back at 1.9.  His fluid status is likely related to dietary indiscretions.  On account of his abnormal nuclear examination a cardiac catheterization was planned right and left, this had to be rescheduled due to scheduling conflicts.  It should be noted the patient requests no sedation for the catheterization as he gets paradoxically hyperactive when he gets sedation.  He had frequent falls in the past as a result of which were considering a Watchman device but lately he has quit falling.    No Angina    Previous diagnostic testing for coronary artery disease includes cardiac catheterization, echocardiogram, EPS study.    Dyspnea  Evaluation of shortness of breath. Dyspnea has been mild, and generally lasting minutes. Episodes usually occur with exertion and have been stable.    No Palpitations     No Syncope    No Claudication    Edema  He has had a lot of edema and the doctors at [facility] general had given him a 'super diuretic' for a few days and his edema is now better    Past Medical History:   Diagnosis Date   . BPH (benign prostatic hypertrophy)    . CAD (coronary artery disease) 09/25/2011   . CHF (NYHA class II, ACC/AHA stage C) (CMS HCC) 10/24/2013   . CVA (cerebrovascular accident) (CMS HCC)    . Dementia    . Diabetes mellitus (CMS HCC)    . Diabetes mellitus, type 2 (CMS HCC)    . HTN (hypertension)    . MI (myocardial infarction) (CMS HCC)    . Renal stones    . Wears dentures    . Wears glasses          Family Medical History:     Problem Relation (Age of  Onset)    Cancer Sister    Coronary Artery Disease Other    Diabetes Mother, Sister    High Cholesterol Father    Hypertension Father            Current Outpatient Prescriptions   Medication Sig Dispense Refill   . alum-mag hydroxide-simethicone (MAALOX MAX) 400-400-40 mg/5 mL Oral Suspension Take 15 mL by mouth Four times a day as needed     . ammonium lactate (GERI-HYDROLAC) 12 % Cream      . ammonium lactate (LAC-HYDRIN) 12 % Lotion Apply topically Twice daily rub in to affected area well     . apixaban (ELIQUIS) 5 mg Oral Tablet Take 5 mg by mouth Twice daily     . aspirin (ECOTRIN) 81 mg Oral Tablet, Delayed Release (E.C.) Take 81 mg by mouth Once a day     . bacitracin zinc (ANTIBIOTIC, BACITRACIN ZINC,) 500 unit/gram Ointment by Apply Topically route Twice per day as needed     . bisacodyl (DULCOLAX, BISACODYL,) 5 mg Oral Tablet, Delayed Release (E.C.) Take 5 mg by mouth Every 24 hours as needed for Constipation     . carvedilol (COREG) 6.25 mg Oral Tablet Take 1 Tab (6.25  mg total) by mouth Twice daily with food (Patient taking differently: Take 12.5 mg by mouth Twice daily with food ) 60 Tab 3   . cholecalciferol, vitamin D3, 1,000 unit Oral Tablet Take 1,000 Units by mouth Once a day     . Coenzyme Q10 (CO Q-10) 100 mg Oral Capsule Take by mouth Once a day      . digoxin (LANOXIN) 125 mcg Oral Tablet Take 0.125 mg by mouth Once a day     . donepezil (ARICEPT) 10 mg Oral Tablet Take 10 mg by mouth Every night     . Ferrous Sulfate 142 mg (45 mg iron) Oral Tablet Sustained Release Take by mouth     . fluticasone propionate (FLUTICASONE NASL) by Nasal route     . folic acid (FOLVITE) 1 mg Oral Tablet Take 1 mg by mouth Once a day     . furosemide (LASIX) 40 mg Oral Tablet Take 40 mg by mouth Twice daily      . insulin glargine (LANTUS) 100 unit/mL Subcutaneous injection (vial) 15 Units by Subcutaneous route Every night     . losartan (COZAAR) 25 mg Oral Tablet Take 100 mg by mouth Once a day      . metFORMIN  (GLUCOPHAGE) 500 mg Oral Tablet Take 500 mg by mouth Twice daily with food      . multivitamin Oral Tablet Take 1 Tab by mouth Once a day     . nitroglycerin (NITROSTAT) 0.4 mg Sublingual Tablet, Sublingual 1 Tab (0.4 mg total) by Sublingual route Every 5 minutes as needed for Chest pain. for 3 doses over 15 minutes 30 Tab 5   . Omega-3 Fatty Acids-Vitamin E (FISH OIL) 1,000 mg Oral Capsule Take 1,000 mg by mouth Once a day     . ranitidine (ZANTAC) 150 mg Oral Tablet Take 150 mg by mouth Once a day        No current facility-administered medications for this visit.      Allergies   Allergen Reactions   . Lipitor [Atorvastatin]      Concerned about cognitive function.   . Pravastatin Rash     Social History     Social History   . Marital status: Divorced     Spouse name: N/A   . Number of children: N/A   . Years of education: N/A     Occupational History   . Not on file.     Social History Main Topics   . Smoking status: Never Smoker   . Smokeless tobacco: Never Used   . Alcohol use No   . Drug use: No   . Sexual activity: Not on file     Other Topics Concern   . Right Hand Dominant Yes     Social History Narrative       Review of Systems  Constitutional: negative  Eyes: negative for blurry vision  Ears, nose, mouth, throat, and face: negative his ears plug up from time to time  Respiratory: negative for cough, sputum, asthma, or wheezing  Cardiovascular: negative for near-syncope, syncope, and claudication  Gastrointestinal: negative for abdominal pain  Genitourinary: negative  Integument/breast: negative  Hematologic/lymphatic: negative for easy bruising  Musculoskeletal: negative for arthralgias or joint stiffness  Neurological: negative for memory problems  Behavioral/Psych: negative for anxiety or depression  Endocrine: negative  Allergic/Immunologic: negative    Objective:     There were no vitals taken for this visit.  General:  Alert, cooperative, no distress,  appears stated age.   Oropharynx: Normal.    Neck: Supple. No JVD.   Lung: Clear to auscultation bilaterally.   Heart:  Regular rate and rhythm, S1, S2 normal, no murmur, click, rub or gallop.   Abdomen: Soft, non-tender. Bowel sounds normal. No masses,  no organomegaly.   Extremities: Extremities normal, atraumatic, no cyanosis or edema.   Pulses: Diminished pulses   Skin:  Warm and dry, no hyperpigmentation, vitiligo, or suspicious lesions.   Neuro: Normal without focal findings, mental status normal, speech normal, alert and oriented x3,PERRLA, reflexes normal and symmetric.     Cardiographics  MPS 03/2017    Cath 2013  history of coronary artery disease with stenting of his LAD as well as RCA in 2013    TTE Jun 2018      TTE Nov 2017      Most recent AICD check 2017    SJM #[id number]  CRT-D.  Mode=DDD@70  bpm.  Battery status etr=4.0 years.  A: 390 ohms, P=nt, threshold=nt  [output=auto ].  RV: 340 ohms, R= > 12 mV, threshold=nt  [output=2.5V and 0.78ms ].  LV: 550 ohms, threshold=nt  [output=2.5V and 0.16ms ].  Ap=<1% ,Vp= 97% . AF burden=>99%.  VF detections=200 bpm. No recent vt/vf episodes.  No new alerts.  Normal device check.  Will recheck again in 6 months from home by remote transmission.  Darlyn Chamber, RN  12.28.17        AICD implant [phone]  PLAN:  The device is programmed to provide DDD pacing between 70-120 ppm.  This is currently programmed to be a single zone device detecting ventricular fibrillation as rates exceeding 200 beats per minute and delivering high output therapies.    We will initiate oral amiodarone to attempt to maintain a sinus mechanism.      Jackquline Bosch, MD  Associate Professor, Section of Cardiology  [facility] Department of Medicine    AV Node Ablation 2017  IMPRESSION AND PLAN:  Acutely successful AV node ablation for refractory atrial fibrillation.  The patient will be monitored with bedrest for the next 4 hours and will be discharged to home.  He will return to my office for followup in 6-8 weeks.      Hazle Quant,  MD  Assistant Professor  [facility] Department of Medicine, Section of Cardiology            Assessment:     Patient is a 76 y.o. male who presents for follow-up of CAD cardiomyopathy AFib with fall risk. He has seen structural heart however has stopped falling as he is in a wheelchair most of the time now.    Plan:   (I48.91) Atrial fibrillation (CMS HCC)  (primary encounter diagnosis)  Continue Eliquis    (I25.5) Ischemic cardiomyopathy (I25.10) CAD (coronary artery disease) (I44.7) LBBB (left bundle branch block)  Patient's MPS is abnormal in the setting of significant LV dysfunction with a prior history of stenting of the procedure diagnostic right and left heart catheterization.  Patient notes that on the previous episode he had paradoxic agitation with use of sedatives and had lipids IVs out and walked out of the hospital.  This time it plan on doing a groin approach and  Avoid sedation altogether.  Catheter setup with Dr Dellia Beckwith and a staff message was sent to him with patient request.   Repeat BASIC METABOLIC PANEL, FASTING has been ordered and patient insisted are aware with a creatinine to worsen the catheterization may need to be rescheduled  yet again.    (I10) Essential hypertension  Plan: BASIC METABOLIC PANEL, FASTING    (E78.2) Mixed hyperlipidemia  Goal LDL less than 70    Follow-up in a month.    Orders Placed This Encounter   . CBC/DIFF   . COMPREHENSIVE METABOLIC PANEL, NON-FASTING

## 2017-05-06 NOTE — Patient Instructions (Signed)
Heart Failure: Making Changes to Your Diet    You have a condition called heart failure. When you have heart failure, excess fluid is more likely to build up in your body because your heart isn't working well. This makes the heart work harder to pump blood. Fluid buildup causes symptoms such as shortness of breath and swelling (edema). This is often referred to as congestive heart failure or CHF. Controlling the amount of salt (sodium) you eat may help stop fluid from building up. Your doctor may also tell you to reduce the amount of fluid you drink.  Reading food labels  Your healthcare provider will tell you how much sodium you can eat each day. Read food labels to keep track. Keep in mind that certain foods are high in salt. These include canned, frozen, and processed foods. Check the amount of sodium in each serving. Watch out for high-sodium ingredients. These include MSG (monosodium glutamate), baking soda, and sodium phosphate.  Eating less salt  Give yourself time to get used to eating less salt. It may take a little while. Here are some tips to help:   Take the saltshaker off the table. Replace it with salt-free herb mixes and spices.   Eat fresh or plain frozen vegetables. These have much less salt than canned vegetables.   Choose low-sodium snacks like sodium-free pretzels, crackers, or air-popped popcorn.   Don't add salt to your food when you're cooking. Instead, season your foods with pepper, lemon, garlic, or onion.   When you eat out, ask that your food be cooked without added salt.   Avoid eating fried foods as these often have a great deal of salt.  If you're told to limit fluids  You may need to limit how much fluid you have to help prevent swelling. This includes anything that is liquid at room temperature, such as ice cream and soup. If your doctor tells you to limit fluid, try these tips:   Measure drinks in a measuring cup before you drink them. This will help you meet daily goals.    Chill drinks to make them more refreshing.   Suck on frozen lemon wedges to quench thirst.   Only drink when you're thirsty.   Chew sugarless gum or suck on hard candy to keep your mouth moist.   Weigh yourself daily to know if your body's fluid content is rising.  My sodium goal  Your healthcare provider may give you a sodium goal to meet each day. This includes sodium found in food as well as salt that you add. My goal is to eat no more than ___________ mg of sodium per day.    When to call your doctor  Call your doctor right away if you have any symptoms of worsening heart failure. These can include:   Sudden weight gain   Increased swelling of your legs or ankles   Trouble breathing when you're resting or at night   Increase in the number of pillows you have to sleep on   Chest pain, pressure, discomfort, or pain in the jaw, neck, or back   Date Last Reviewed: 10/02/2014   2000-2018 [employer] [address], [place] [address]. All rights reserved. This information is not intended as a substitute for professional medical care. Always follow your healthcare professional's instructions.

## 2017-05-06 NOTE — Nursing Note (Signed)
Patients scheduled heart cath was rescheduled.   Patient was in clinic today and provided the below instructions.  Patient ordered cardiac cath. Dr. Dellia Beckwith to be the physician to perform the procedure. Patient scheduled for May 19, 2017 @ 9 AM and instructed to report to registration at 7 AM.  Patient aware that someone will call them the afternoon/evening before to confirm times of arrival and procedure.  Patient will be NPO at midnight.   Patient will have someone to drive them home. Every effort will be made to discharge the patient home same day, however you may need to stay overnight. Suggest packing a small bag of essentials just in case.  Reviewed medication list. Patient is to hold oral diabetic medication day of procedure.    The patient may take all other meds with a sip of water, including aspirin and plavix.  Pt does not have contrast dye allergy.  Pt does not have upcoming surgeries/procedures.  Pre-Procedure/Op needs- Labs BMP, CBC diff and PT/INR (within 30 days of procedure).    Recent labs in results review.  Understanding was verbalized of the given information. All questions were answered. Patient was instructed to contact our office if they had any additional questions or concerns about the above scheduled procedure.

## 2017-05-12 ENCOUNTER — Encounter (HOSPITAL_COMMUNITY): Payer: Self-pay

## 2017-05-15 ENCOUNTER — Other Ambulatory Visit (INDEPENDENT_AMBULATORY_CARE_PROVIDER_SITE_OTHER): Payer: Self-pay

## 2017-05-15 ENCOUNTER — Inpatient Hospital Stay (HOSPITAL_COMMUNITY)
Admission: RE | Admit: 2017-05-15 | Payer: Medicare Other | Source: Ambulatory Visit | Admitting: INTERNAL MEDICINE CARDIOVASCULAR DISEASE

## 2017-05-15 ENCOUNTER — Encounter (HOSPITAL_COMMUNITY): Admission: RE | Payer: Self-pay | Source: Ambulatory Visit

## 2017-05-15 SURGERY — RIGHT AND LEFT HEART CATH

## 2017-05-19 ENCOUNTER — Ambulatory Visit
Admission: RE | Admit: 2017-05-19 | Discharge: 2017-05-20 | Disposition: A | Discharge status: Other Acute Inpatient Hospital | Payer: Medicare Other | Source: Ambulatory Visit | Attending: Hospitalist | Admitting: Hospitalist

## 2017-05-19 ENCOUNTER — Encounter (HOSPITAL_COMMUNITY): Payer: Self-pay

## 2017-05-19 ENCOUNTER — Encounter (HOSPITAL_COMMUNITY)
Admission: RE | Disposition: A | Discharge status: Other Acute Inpatient Hospital | Payer: Self-pay | Source: Ambulatory Visit | Attending: Hospitalist

## 2017-05-19 ENCOUNTER — Ambulatory Visit (HOSPITAL_COMMUNITY): Payer: Medicare Other | Admitting: Internal Medicine

## 2017-05-19 DIAGNOSIS — I5020 Unspecified systolic (congestive) heart failure: Secondary | ICD-10-CM | POA: Diagnosis present

## 2017-05-19 DIAGNOSIS — I481 Persistent atrial fibrillation: Secondary | ICD-10-CM | POA: Insufficient documentation

## 2017-05-19 DIAGNOSIS — N189 Chronic kidney disease, unspecified: Secondary | ICD-10-CM

## 2017-05-19 DIAGNOSIS — F0390 Unspecified dementia without behavioral disturbance: Secondary | ICD-10-CM | POA: Insufficient documentation

## 2017-05-19 DIAGNOSIS — E1129 Type 2 diabetes mellitus with other diabetic kidney complication: Secondary | ICD-10-CM | POA: Diagnosis present

## 2017-05-19 DIAGNOSIS — Z79899 Other long term (current) drug therapy: Secondary | ICD-10-CM | POA: Insufficient documentation

## 2017-05-19 DIAGNOSIS — I447 Left bundle-branch block, unspecified: Secondary | ICD-10-CM | POA: Insufficient documentation

## 2017-05-19 DIAGNOSIS — N183 Chronic kidney disease, stage 3 unspecified: Secondary | ICD-10-CM | POA: Diagnosis present

## 2017-05-19 DIAGNOSIS — E782 Mixed hyperlipidemia: Secondary | ICD-10-CM | POA: Insufficient documentation

## 2017-05-19 DIAGNOSIS — I252 Old myocardial infarction: Secondary | ICD-10-CM | POA: Insufficient documentation

## 2017-05-19 DIAGNOSIS — Z955 Presence of coronary angioplasty implant and graft: Secondary | ICD-10-CM | POA: Insufficient documentation

## 2017-05-19 DIAGNOSIS — I482 Chronic atrial fibrillation: Secondary | ICD-10-CM

## 2017-05-19 DIAGNOSIS — I2510 Atherosclerotic heart disease of native coronary artery without angina pectoris: Secondary | ICD-10-CM

## 2017-05-19 DIAGNOSIS — Z7901 Long term (current) use of anticoagulants: Secondary | ICD-10-CM | POA: Insufficient documentation

## 2017-05-19 DIAGNOSIS — Z86010 Personal history of colonic polyps: Secondary | ICD-10-CM | POA: Insufficient documentation

## 2017-05-19 DIAGNOSIS — Z7982 Long term (current) use of aspirin: Secondary | ICD-10-CM | POA: Insufficient documentation

## 2017-05-19 DIAGNOSIS — I255 Ischemic cardiomyopathy: Secondary | ICD-10-CM

## 2017-05-19 DIAGNOSIS — R9439 Abnormal result of other cardiovascular function study: Secondary | ICD-10-CM

## 2017-05-19 DIAGNOSIS — I2582 Chronic total occlusion of coronary artery: Secondary | ICD-10-CM | POA: Insufficient documentation

## 2017-05-19 DIAGNOSIS — I5042 Chronic combined systolic (congestive) and diastolic (congestive) heart failure: Secondary | ICD-10-CM | POA: Insufficient documentation

## 2017-05-19 DIAGNOSIS — Z7984 Long term (current) use of oral hypoglycemic drugs: Secondary | ICD-10-CM

## 2017-05-19 DIAGNOSIS — E1122 Type 2 diabetes mellitus with diabetic chronic kidney disease: Secondary | ICD-10-CM

## 2017-05-19 DIAGNOSIS — I4891 Unspecified atrial fibrillation: Secondary | ICD-10-CM | POA: Diagnosis present

## 2017-05-19 DIAGNOSIS — I5023 Acute on chronic systolic (congestive) heart failure: Secondary | ICD-10-CM

## 2017-05-19 DIAGNOSIS — Z95810 Presence of automatic (implantable) cardiac defibrillator: Secondary | ICD-10-CM | POA: Insufficient documentation

## 2017-05-19 DIAGNOSIS — I480 Paroxysmal atrial fibrillation: Secondary | ICD-10-CM | POA: Insufficient documentation

## 2017-05-19 DIAGNOSIS — Z8673 Personal history of transient ischemic attack (TIA), and cerebral infarction without residual deficits: Secondary | ICD-10-CM | POA: Insufficient documentation

## 2017-05-19 DIAGNOSIS — K219 Gastro-esophageal reflux disease without esophagitis: Secondary | ICD-10-CM | POA: Insufficient documentation

## 2017-05-19 DIAGNOSIS — N400 Enlarged prostate without lower urinary tract symptoms: Secondary | ICD-10-CM | POA: Insufficient documentation

## 2017-05-19 DIAGNOSIS — I509 Heart failure, unspecified: Secondary | ICD-10-CM

## 2017-05-19 DIAGNOSIS — Z794 Long term (current) use of insulin: Secondary | ICD-10-CM | POA: Insufficient documentation

## 2017-05-19 DIAGNOSIS — I130 Hypertensive heart and chronic kidney disease with heart failure and stage 1 through stage 4 chronic kidney disease, or unspecified chronic kidney disease: Secondary | ICD-10-CM | POA: Insufficient documentation

## 2017-05-19 DIAGNOSIS — Z87442 Personal history of urinary calculi: Secondary | ICD-10-CM | POA: Insufficient documentation

## 2017-05-19 HISTORY — DX: Gastro-esophageal reflux disease without esophagitis: K21.9

## 2017-05-19 HISTORY — DX: Unspecified cataract: H26.9

## 2017-05-19 HISTORY — DX: Presence of cardiac pacemaker: Z95.0

## 2017-05-19 HISTORY — DX: Type 2 diabetes mellitus without complications (CMS HCC): E11.9

## 2017-05-19 HISTORY — DX: Disorder of prostate, unspecified: N42.9

## 2017-05-19 HISTORY — DX: Presence of coronary angioplasty implant and graft: Z95.5

## 2017-05-19 HISTORY — DX: Presence of automatic (implantable) cardiac defibrillator: Z95.810

## 2017-05-19 LAB — BASIC METABOLIC PANEL
ANION GAP: 11 mmol/L
BUN/CREA RATIO: 17
BUN: 33 mg/dL — ABNORMAL HIGH (ref 10–25)
CALCIUM: 9.2 mg/dL (ref 8.2–10.2)
CHLORIDE: 106 mmol/L (ref 98–111)
CO2 TOTAL: 25 mmol/L (ref 21–35)
CREATININE: 1.97 mg/dL — ABNORMAL HIGH (ref <=1.30)
ESTIMATED GFR: 33 mL/min/1.73mˆ2 — ABNORMAL LOW
GLUCOSE: 126 mg/dL — ABNORMAL HIGH (ref 70–110)
POTASSIUM: 4.5 mmol/L (ref 3.5–5.0)
SODIUM: 142 mmol/L (ref 135–145)

## 2017-05-19 LAB — CBC WITH DIFF
BASOPHIL #: 0 x10ˆ3/uL (ref 0.00–0.20)
BASOPHIL %: 1 %
EOSINOPHIL #: 0.1 x10ˆ3/uL (ref 0.00–0.50)
EOSINOPHIL %: 2 %
HCT: 42.6 % (ref 38.9–50.5)
HGB: 14.1 g/dL (ref 13.4–17.3)
LYMPHOCYTE #: 1 x10ˆ3/uL (ref 0.80–3.20)
LYMPHOCYTE %: 15 %
MCH: 30.8 pg (ref 27.9–33.1)
MCHC: 33.2 g/dL (ref 32.8–36.0)
MCV: 92.7 fL (ref 82.4–95.0)
MONOCYTE #: 0.6 x10ˆ3/uL (ref 0.20–0.80)
MONOCYTE %: 9 %
MPV: 9 fL (ref 6.0–10.2)
NEUTROPHIL #: 4.8 x10ˆ3/uL (ref 1.60–5.50)
NEUTROPHIL %: 73 %
PLATELETS: 156 x10ˆ3/uL (ref 140–440)
RBC: 4.6 x10ˆ6/uL (ref 4.40–5.68)
RDW: 15.7 % — ABNORMAL HIGH (ref 10.9–15.1)
WBC: 6.6 x10ˆ3/uL (ref 3.3–9.3)

## 2017-05-19 LAB — ARTERIAL BLOOD GAS/CO-OX
%FIO2 (ARTERIAL): 32 %
BASE EXCESS (ARTERIAL): 0.3 mmol/L (ref <=2.0)
BICARBONATE (ARTERIAL): 25.1 mmol/L — ABNORMAL HIGH (ref 22.0–28.0)
CARBOXYHEMOGLOBIN: 2.7 % (ref 0.0–3.0)
HEMOGLOBIN: 13.4 g/dL
MET-HEMOGLOBIN: 1 % (ref 0.0–1.5)
O2 SATURATION (ARTERIAL): 99.2 % — ABNORMAL HIGH (ref 95.0–98.0)
OXYHEMOGLOBIN: 95.6 % (ref 95.0–98.0)
PCO2 (ARTERIAL - TEMP CORRECTED): 38 mm/Hg
PCO2 (ARTERIAL): 38 mm/Hg (ref 35.0–48.0)
PH (ARTERIAL): 7.42 (ref 7.35–7.45)
PH (T): 7.42
PO2 (ARTERIAL - TEMP CORRECTED: 96
PO2 (ARTERIAL): 96 mm/Hg (ref 80.0–100.0)
PO2 (ARTERIAL): 96 mm/Hg (ref 80.0–100.0)
TCO2: 25.8
TEMPERATURE, COMP: 37 C (ref 15.0–45.0)

## 2017-05-19 LAB — VENOUS BLOOD GAS/CO-OX
%FIO2 (VENOUS): 32 %
BICARBONATE (VENOUS): 24.6 mmol/L (ref 23.0–33.0)
CARBOXYHEMOGLOBIN: 1.9 % (ref 0.0–3.0)
O2 SATURATION (VENOUS): 43.8 % — ABNORMAL LOW (ref 95.0–98.0)
PCO2 (VENOUS): 43 mm/Hg (ref 38.00–50.00)
PH (TEMPERATURE CORRECTED): 7.4
PH (VENOUS): 7.4 (ref 7.32–7.45)
PO2 (VENOUS - TEMP CORRECTED): 26 mm/Hg
PO2 (VENOUS): 26 mm/Hg — ABNORMAL LOW (ref 30.0–50.0)

## 2017-05-19 LAB — POC BLOOD GLUCOSE (RESULTS)
GLUCOSE, POC: 73 mg/dL (ref 70–110)
GLUCOSE, POC: 169 mg/dL — ABNORMAL HIGH (ref 70–110)

## 2017-05-19 LAB — B-TYPE NATRIURETIC PEPTIDE: BNP: 1420 pg/mL — ABNORMAL HIGH (ref 0–100)

## 2017-05-19 SURGERY — R/L HEART CATH W/CORONARY ANGIO W/WO LVG

## 2017-05-19 MED ORDER — ACETYLCYSTEINE 20% RENAL PROTECTION ORAL SOLN
600.0000 mg | Freq: Two times a day (BID) | ORAL | 0 refills | Status: DC
Start: 2017-05-20 — End: 2017-05-20

## 2017-05-19 MED ORDER — ONDANSETRON HCL (PF) 4 MG/2 ML INJECTION SOLUTION
4.0000 mg | Freq: Four times a day (QID) | INTRAMUSCULAR | Status: DC | PRN
Start: 2017-05-19 — End: 2017-05-20

## 2017-05-19 MED ORDER — MIDAZOLAM 1 MG/ML INJECTION SOLUTION
Freq: Once | INTRAMUSCULAR | Status: DC | PRN
Start: 2017-05-19 — End: 2017-05-19
  Administered 2017-05-19 (×2): 1 mg via INTRAVENOUS

## 2017-05-19 MED ORDER — METOLAZONE 5 MG TABLET
5.0000 mg | ORAL_TABLET | Freq: Every day | ORAL | Status: DC
Start: 2017-05-19 — End: 2017-05-20
  Administered 2017-05-19: 5 mg via ORAL
  Filled 2017-05-19 (×2): qty 1

## 2017-05-19 MED ORDER — FLUTICASONE PROPIONATE 50 MCG/ACTUATION NASAL SPRAY,SUSPENSION
2.0000 | Freq: Two times a day (BID) | NASAL | Status: DC
Start: 2017-05-19 — End: 2017-05-20
  Administered 2017-05-19: 2 via NASAL
  Filled 2017-05-19: qty 16

## 2017-05-19 MED ORDER — ACETYLCYSTEINE 20% RENAL PROTECTION ORAL SOLN
600.0000 mg | Freq: Two times a day (BID) | ORAL | Status: DC
Start: 2017-05-19 — End: 2017-05-20
  Administered 2017-05-19 – 2017-05-20 (×3): 600 mg via ORAL
  Filled 2017-05-19 (×4): qty 4

## 2017-05-19 MED ORDER — COENZYME Q10 100 MG CAPSULE
100.0000 mg | ORAL_CAPSULE | Freq: Every day | ORAL | Status: DC
Start: 2017-05-20 — End: 2017-05-20

## 2017-05-19 MED ORDER — INSULIN LISPRO 100 UNIT/ML SUBCUTANEOUS SSIP - ~~LOC~~/GRMC
1.0000 [IU] | Freq: Four times a day (QID) | SUBCUTANEOUS | Status: DC
Start: 2017-05-19 — End: 2017-05-20

## 2017-05-19 MED ORDER — LIDOCAINE HCL 20 MG/ML (2 %) INJECTION SOLUTION
Freq: Once | INTRAMUSCULAR | Status: DC | PRN
Start: 2017-05-19 — End: 2017-05-19
  Administered 2017-05-19: 10 mL via INTRADERMAL

## 2017-05-19 MED ORDER — FUROSEMIDE 10 MG/ML INJECTION SOLUTION
40.0000 mg | Freq: Two times a day (BID) | INTRAMUSCULAR | Status: DC
Start: 2017-05-19 — End: 2017-05-20

## 2017-05-19 MED ORDER — FUROSEMIDE 10 MG/ML INJECTION SOLUTION
40.0000 mg | Freq: Two times a day (BID) | INTRAMUSCULAR | Status: DC
Start: 2017-05-19 — End: 2017-05-20
  Filled 2017-05-19 (×3): qty 4

## 2017-05-19 MED ORDER — DEXTROSE 50 % IN WATER (D50W) INTRAVENOUS SYRINGE
25.0000 g | INJECTION | INTRAVENOUS | Status: DC | PRN
Start: 2017-05-19 — End: 2017-05-20

## 2017-05-19 MED ORDER — DONEPEZIL 5 MG TABLET
10.0000 mg | ORAL_TABLET | Freq: Every evening | ORAL | Status: DC
Start: 2017-05-19 — End: 2017-05-20
  Administered 2017-05-19: 10 mg via ORAL
  Filled 2017-05-19 (×2): qty 2

## 2017-05-19 MED ORDER — SODIUM CHLORIDE 0.9 % INTRAVENOUS SOLUTION
INTRAVENOUS | Status: DC | PRN
Start: 2017-05-19 — End: 2017-05-19
  Administered 2017-05-19: 25 mL/h via INTRAVENOUS

## 2017-05-19 MED ORDER — SODIUM CHLORIDE 0.9 % INTRAVENOUS SOLUTION
Freq: Once | INTRAVENOUS | Status: AC
Start: 2017-05-19 — End: 2017-05-19

## 2017-05-19 MED ORDER — ACETAMINOPHEN 325 MG TABLET
650.0000 mg | ORAL_TABLET | Freq: Four times a day (QID) | ORAL | Status: DC | PRN
Start: 2017-05-19 — End: 2017-05-20

## 2017-05-19 MED ORDER — SODIUM CHLORIDE 0.9 % (FLUSH) INJECTION SYRINGE
3.0000 mL | INJECTION | Freq: Three times a day (TID) | INTRAMUSCULAR | Status: DC
Start: 2017-05-19 — End: 2017-05-20
  Administered 2017-05-19 – 2017-05-20 (×3): 3 mL

## 2017-05-19 MED ORDER — INSULIN LISPRO 100 UNIT/ML SUBCUTANEOUS SSIP - ~~LOC~~/GRMC
1.0000 [IU] | Freq: Four times a day (QID) | SUBCUTANEOUS | Status: DC
Start: 2017-05-19 — End: 2017-05-20
  Administered 2017-05-19: 0 [IU] via SUBCUTANEOUS
  Filled 2017-05-19: qty 300

## 2017-05-19 MED ORDER — FOLIC ACID 1 MG TABLET
1.0000 mg | ORAL_TABLET | Freq: Every day | ORAL | Status: DC
Start: 2017-05-20 — End: 2017-05-20
  Filled 2017-05-19: qty 1

## 2017-05-19 MED ORDER — METOLAZONE 5 MG TABLET
5.0000 mg | ORAL_TABLET | Freq: Every day | ORAL | Status: DC
Start: 2017-05-20 — End: 2017-05-27

## 2017-05-19 MED ORDER — CHOLECALCIFEROL (VITAMIN D3) 25 MCG (1,000 UNIT) TABLET
1000.0000 [IU] | ORAL_TABLET | Freq: Every day | ORAL | Status: DC
Start: 2017-05-20 — End: 2017-05-20
  Filled 2017-05-19: qty 1

## 2017-05-19 MED ORDER — INSULIN GLARGINE (U-100) 100 UNIT/ML SUBCUTANEOUS SOLUTION
15.0000 [IU] | Freq: Every evening | SUBCUTANEOUS | Status: DC
Start: 2017-05-19 — End: 2017-05-20
  Administered 2017-05-19: 15 [IU] via SUBCUTANEOUS
  Filled 2017-05-19: qty 1000

## 2017-05-19 MED ORDER — FENTANYL (PF) 50 MCG/ML INJECTION SOLUTION
Freq: Once | INTRAMUSCULAR | Status: DC | PRN
Start: 2017-05-19 — End: 2017-05-19
  Administered 2017-05-19: 25 ug via INTRAVENOUS

## 2017-05-19 MED ORDER — SODIUM CHLORIDE 0.9 % INTRAVENOUS SOLUTION
Freq: Once | INTRAVENOUS | Status: DC | PRN
Start: 2017-05-19 — End: 2017-05-19
  Administered 2017-05-19 (×3): 500 mL

## 2017-05-19 MED ORDER — IODIXANOL 320 MG IODINE/ML INTRAVENOUS SOLUTION
Freq: Once | INTRAVENOUS | Status: DC | PRN
Start: 2017-05-19 — End: 2017-05-19
  Administered 2017-05-19: 150 mL via INTRAMUSCULAR

## 2017-05-19 MED ORDER — SODIUM CHLORIDE 0.9 % (FLUSH) INJECTION SYRINGE
3.0000 mL | INJECTION | INTRAMUSCULAR | Status: DC | PRN
Start: 2017-05-19 — End: 2017-05-20

## 2017-05-19 MED ADMIN — sodium chloride 0.9 % (flush) injection syringe: NASAL | @ 20:00:00

## 2017-05-19 MED ADMIN — HYDROmorphone (PF) 30 mg/30 mL (1 mg/mL) in 0.9 % NaCl IV PCA syringe: INTRAVENOUS | @ 10:00:00

## 2017-05-19 MED ADMIN — sodium chloride 0.9 % (flush) injection syringe: INTRAVENOUS | @ 10:00:00

## 2017-05-19 SURGICAL SUPPLY — 34 items
BANDAGE HEMCON PTCH PRO 1.5X1.5IN HMST LF (WOUND CARE SUPPLY) ×2 IMPLANT
BANDAGE HEMCON PTCH PRO 1.5X1._5IN HMST LF (WOUND CARE/ENTEROSTOMAL SUPPLY) ×1
CATH ANGIO 6FR FL4 CURVE 100CM EXPO WRE BRD RBST SHAFT FEM TRILON (DIAGNOSTIC) ×2 IMPLANT
CATH ANGIO 6FR FL4 CURVE 100CM_EXPO WRE BRD RBST SHFT FEM (DIAGNOSTIC) ×1
CATH ANGIO 6FR FR4 CURVE 100CM EXPO FULL LGTH WRE BRD RBST SHAFT VENTRIC TRILON (DIAGNOSTIC) ×2 IMPLANT
CATH ANGIO 6FR FR4 CURVE 100CM_EXPO FULL LGTH WRE BRD RBST (DIAGNOSTIC) ×1
CATH ART LN SWANGANZ 7FR 110CM_STD 4 LUM TD VOL (Central Venous Lines) ×1
CATH PA 7FR 110CM SWANGANZ 4 LUM TD STD STRL LTX DISP (Central Venous Lines) ×2 IMPLANT
CONV USE ITEM 338637 - PACK SURG UNIV CV STRL DISP LF (CUSTOM TRAYS & PACK) ×2
DISC ITEM - PAD DEFIBR 60IN PHLPS RADIOLUC_ENT ADULT LF (Electrical Supplies) ×3 IMPLANT
DISCONTINUED - CATH ANGIO 6FR FL4 CURVE 100CM EXPO WRE BRD RBST SHAFT FEM TRILON (DIAGNOSTIC) ×2 IMPLANT
GW START .035IN 10CM 260CM FIX COR FINGER STRG PTFE VAS PERI J CURVE TAPER (WIRE) ×2 IMPLANT
GW START .035IN 3CM 260CM FLXB_SPRG COIL TIP FIX COR FNGR (WIRE) ×1
KIT ANGIO NAMIC MTS CTH LB STRL LF  DISP UN HOSPITAL CNTR (CATHETERS) ×2 IMPLANT
KIT CATH LAB CUSTOM NAVILYST_BX/8 H749602105741 (CATHETERS) ×1
KIT MICROINTRO MNSTCK 7CM MAX 4FR 21GA TUNG NITINOL COAX STIFFEN GW ECHGN NEEDLE STRL DISP (NEEDLES & SYRINGE SUPPLIES) ×2 IMPLANT
KIT MICROINTRO MNSTCK 7CM MAX_4FR 21GA TUNG NITINOL COAX (NEEDLES & SYRINGE SUPPLIES) ×2
KIT SYRG MDRD FSTN FIL TUBE 15_0ML MRK5 PRVS STRL LF DISP (NEEDLES & SYRINGE SUPPLIES) ×1
KIT SYRG MEDRAD FASTURN FIL TUBE 150ML MRK5 PRVS STRL LF  DISP (NEEDLES & SYRINGE SUPPLIES) ×2 IMPLANT
MANIFLD ANGIO 1 PORT OFF HNDL ROT ADPR MED PRESS ACTL PLYCRB NAMIC PRCPTR STRL CLR (CARDIAC) ×2 IMPLANT
MANIFLD ANGIO 1 PORT OFF HNDL_ROT ADPR MED PRESS ACTL PLYCRB (CARDIAC) ×1
NEEDLE 7CM 18GA TW WO WNG SHLD ANGIOGRAPY SAF PROC STRL LF  SECURELOC 12ML (NEEDLES & SYRINGE SUPPLIES) ×2 IMPLANT
NEEDLE 7CM 18GA TW WO WNG SHLD_ANGIOGRAPY SAF PROC STRL LF (NEEDLES & SYRINGE SUPPLIES) ×1
PACK CARDIOVASCULAR CUST UHC_DYNJ64219A (CUSTOM TRAYS & PACK) ×1
PACK UNIVERSAL CARDIOVASCULAR  - ~~LOC~~ (CUSTOM TRAYS & PACK) ×2 IMPLANT
SHEATH 7FR 11CM INPUT SIL .038IN DL DIST J CURVE INTROD (GUIDING) ×2 IMPLANT
SHEATH 7FR 11CM INPUT SIL .038_IN DL DIST J CURVE INTROD (GUIDING) ×1
SHEATH INTROD 10CM 6FR PINN .035IN PERI SS SNAPON DIL LOCK KINK RST SMOOTH TRNS SPRG COIL GW 2.5CM (INTRODUCER) ×2 IMPLANT
SHEATH INTROD PINNACLE 6FRX10_10/BX RSS601 (INTRODUCER) ×1
STOPCOCK IV 4W LRG BORE ROT MALE LL ADPR LPD RST DEHP-FR STRL LF (Connecting Tubes/Misc) ×6 IMPLANT
STOPCOCK IV 4W LRG BORE ROT MA_LE LL ADPR LPD RST DEHP-FR (Connecting Tubes/Misc) ×3
TUBING ANGIO 1200 PSI 48IN RIGID CNRST INJ ARLS ROT ADPR .188IN .071IN PVC CLR (Connecting Tubes/Misc) ×2 IMPLANT
TUBING HI PRESS 48 X 122CM (Connecting Tubes/Misc) ×1
VISIPAQUE 150ML V564 PK/10 (CONTRAST) ×3 IMPLANT

## 2017-05-19 NOTE — Discharge Summary (Signed)
[facility]      PATIENT NAMELavelle, [name]  MRN:  [id number]  DOB:        ENCOUNTER START DATE: 05/19/2017  INPATIENT ADMISSION DATE:     DISCHARGE DATE:  05/19/2017    ATTENDING PHYSICIAN: Vedia Pereyra, MD    PRIMARY CARE PHYSICIAN: Maida Sale, MD     ADMISSION DIAGNOSIS: Systolic congestive heart failure, NYHA class 2 (CMS HCC)          DISCHARGE DIAGNOSIS:   Hospital Problems) (* Primary Problem)    Diagnosis Date Noted   . * acute on chronic Systolic congestive heart failure, NYHA class 2 (CMS HCC) 10/02/2014   . Stage 3 chronic kidney disease (CMS [place]) 11/07/2014   . Ischemic cardiomyopathy 10/24/2013   . Atrial fibrillation (CMS HCC) 08/25/2013   . CAD (coronary artery disease) 09/25/2011   . Diabetes mellitus (CMS [place]) 08/29/2011      Resolved Hospital Problems    Diagnosis Date Noted Date Resolved   No resolved problems to display.     Active Non-Hospital Problems    Diagnosis Date Noted   . Bilateral lower extremity edema 09/16/2016   . Diarrhea 07/24/2016   . Persistent atrial fibrillation (CMS HCC) 04/10/2016   . CKD (chronic kidney disease) stage III 06/21/2014   . Anemia 06/21/2014   . Colon polyps 06/21/2014   . Vitamin B deficiency 06/21/2014   . Piriformis syndrome 06/21/2014   . Hyperlipidemia 06/21/2014   . Atrial fibrillation with RVR (CMS HCC) 01/09/2014   . 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 Hypotension 12/08/2013   . CHF (NYHA class II, ACC/AHA stage C) (CMS HCC) 10/24/2013   . Congestive heart failure with LV diastolic dysfunction, NYHA class 1 (CMS HCC) 06/29/2012   . PAF (paroxysmal atrial fibrillation) (CMS HCC) 11/05/2011   . S/P angioplasty with stent 09/25/2011   . Altered mental status 09/04/2011   . Elevated troponin 08/29/2011   . HTN (hypertension) 08/29/2011   . LBBB (left bundle branch block) 08/28/2011      DISCHARGE MEDICATIONS:     Current Discharge Medication List      START taking these  medications.       Details    acetylcysteine 20% (200MG/Ml) Solution   Commonly known as:  MUCOMYST   Start taking on:  05/20/2017    600 mg, Oral, Q12H   Qty:  9 mL   Refills:  0       furosemide 10 mg/mL Solution   Commonly known as:  LASIX   Replaces:  furosemide 40 mg Tablet    40 mg, Intravenous, 2x/day   Refills:  0       insulin lispro 100 unit/mL Solution   Commonly known as:  HUMALOG    1-6 Units, Subcutaneous, 4x/day AC   Refills:  0       metOLazone 5 mg Tablet   Commonly known as:  ZAROXOLYN   Start taking on:  05/20/2017    5 mg, Oral, Daily   Refills:  0         CONTINUE these medications - NO CHANGES were made during your visit.       Details    apixaban 5 mg Tablet   Commonly known as:  ELIQUIS    5 mg, Oral, 2x/day   Refills:  0       aspirin 81 mg Tablet, Delayed Release (E.C.)   Commonly  known as:  ECOTRIN    81 mg, Oral, Daily   Refills:  0       cholecalciferol (vitamin D3) 1,000 unit Tablet    1,000 Units, Oral, Daily   Refills:  0       CO Q-10 100 mg Capsule   Generic drug:  coenzyme Q10    Oral, Daily   Refills:  0       donepezil 10 mg Tablet   Commonly known as:  ARICEPT    10 mg, Oral, NIGHTLY   Refills:  0       Ferrous Sulfate 142 mg (45 mg iron) Tablet Sustained Release    Oral   Refills:  0       FLUTICASONE NASL    Nasal   Refills:  0       folic acid 1 mg Tablet   Commonly known as:  FOLVITE    1 mg, Daily   Refills:  0       insulin glargine 100 unit/mL injection (vial)   Commonly known as:  LANTUS    15 Units, Subcutaneous, NIGHTLY   Refills:  0       losartan 25 mg Tablet   Commonly known as:  COZAAR    100 mg, Oral, Daily   Refills:  0         STOP taking these medications.          alum-mag hydroxide-simethicone 400-400-40 mg/5 mL Suspension   Commonly known as:  MAALOX MAX       ammonium lactate 12 % Cream   Commonly known as:  GERI-HYDROLAC       ammonium lactate 12 % Lotion   Commonly known as:  LAC-HYDRIN       ANTIBIOTIC (BACITRACIN ZINC) 500 unit/gram Ointment   Generic  drug:  bacitracin zinc       carvedilol 6.25 mg Tablet   Commonly known as:  COREG       digoxin 125 mcg Tablet   Commonly known as:  LANOXIN       DULCOLAX (BISACODYL) 5 mg Tablet, Delayed Release (E.C.)   Generic drug:  bisacodyl       FISH OIL 1,000 mg Capsule   Generic drug:  Omega-3 Fatty Acids-Vitamin E       furosemide 40 mg Tablet   Commonly known as:  LASIX   Replaced by:  furosemide 10 mg/mL Solution       metFORMIN 500 mg Tablet   Commonly known as:  GLUCOPHAGE       multivitamin Tablet       nitroGLYCERIN 0.4 mg Tablet, Sublingual   Commonly known as:  NITROSTAT       raNITIdine 150 mg Tablet   Commonly known as:  ZANTAC           DISCHARGE INSTRUCTIONS:   No discharge procedures on file.    REASON FOR HOSPITALIZATION AND HOSPITAL COURSE:  This is a 76 y.o., male that presented in outpatient for cardiac catheterization.  The patient does have a significant history of congestive heart failure and atrial fibrillation.  He was taken to cath lab today by Dr. Lyndee Leo.  The patient was noted to have inclusions of lad and left circumflex arteries.  The cath lab report is still pending and not available to me at the time of this discharge summary.  Patient is being transferred to [facility] for further management.  [facility] does not currently  have any beds and the patient is being held and the intensive care unit at [facility].  The patient was started on IV Lasix for worsening heart failure.  He is also on metolazone that was started on this admission.  Patient is currently having metformin held and is currently on Mucomyst p.o..  The patient does have CKD 3.  This does appear to be stable at this time.  Patient will be transferred to [facility] via ambulance.      SIGNIFICANT LAB:     Results for orders placed or performed during the hospital encounter of 05/19/17 (from the past 24 hour(s))   ARTERIAL BLOOD GAS/CO-OX   Result Value Ref Range    %FIO2  (ARTERIAL) 32 %    PH (ARTERIAL) 7.42 7.35 - 7.45    PCO2 (ARTERIAL) 38.0 35.0 - 48.0 mm/Hg    PO2 (ARTERIAL) 96.0 80.0 - 100.0 mm/Hg    BICARBONATE (ARTERIAL) 25.1 (H) 22.0-28.0 mmol/L mmol/L    BASE EXCESS (ARTERIAL) 0.3 -2.0 - 2.0 mmol/L    O2 SATURATION (ARTERIAL) 99.2 (H) 95.0 - 98.0 %    TCO2 25.8     PH (T) 7.42     PCO2 (ARTERIAL - TEMP CORRECTED) 38.0 mm/Hg    PO2 (ARTERIAL - TEMP CORRECTED 96     TEMPERATURE, COMP 37.0 15.0 - 45.0 C    HEMOGLOBIN 13.4 g/dL    CARBOXYHEMOGLOBIN 2.7 0.0 - 3.0 %    MET-HEMOGLOBIN 1.0 0.0 - 1.5 %    OXYHEMOGLOBIN 95.6 95.0 - 98.0 %   VENOUS BLOOD GAS/CO-OX   Result Value Ref Range    CARBOXYHEMOGLOBIN 1.9 0.0 - 3.0 %    %FIO2 (VENOUS) 32.0 %    HEMOGLOBIN 13.3 g/dL    BICARBONATE (VENOUS) 24.6 23.0 - 33.0 mmol/L    MET-HEMOGLOBIN 1.1 0.0 - 1.5 %    OXYHEMOGLOBIN 42.5 (L) 95.0 - 98.0 %    PCO2 (VENOUS) 43.00 38.00 - 50.00 mm/Hg    PH (TEMPERATURE CORRECTED) 7.4     PO2 (VENOUS - TEMP CORRECTED) 26.0 mm/Hg    TEMPERATURE, COMP 37.0 15.0 - 45.0 C    O2 SATURATION (VENOUS) 43.8 (L) 95.0 - 98.0 %    PH (VENOUS) 7.40 7.32 - 7.45    PO2 (VENOUS) 26.0 (L) 30.0 - 50.0 mm/Hg   B-TYPE NATRIURETIC PEPTIDE   Result Value Ref Range    BNP 1420 (H) 0 - 100 pg/mL   CBC/DIFF    Narrative    The following orders were created for panel order CBC/DIFF.  Procedure                               Abnormality         Status                     ---------                               -----------         ------                     CBC WITH DYNX[[id number]]                Abnormal            Final result  Please view results for these tests on the individual orders.   BASIC METABOLIC PANEL   Result Value Ref Range    SODIUM 142 135 - 145 mmol/L    POTASSIUM 4.5 3.5 - 5.0 mmol/L    CHLORIDE 106 98 - 111 mmol/L    CO2 TOTAL 25 21 - 35 mmol/L    ANION GAP 11 mmol/L    CALCIUM 9.2 8.2 - 10.2 mg/dL    GLUCOSE 126 (H) 70 - 110 mg/dL    BUN 33 (H) 10 - 25 mg/dL    CREATININE 1.97 (H) <=1.30 mg/dL     BUN/CREA RATIO 17     ESTIMATED GFR 33 (L) Avg: 75 mL/min/1.63m2   CBC WITH DIFF   Result Value Ref Range    WBC 6.6 3.3 - 9.3 x10^3/uL    RBC 4.60 4.40 - 5.68 x10^6/uL    HGB 14.1 13.4 - 17.3 g/dL    HCT 42.6 38.9 - 50.5 %    MCV 92.7 82.4 - 95.0 fL    MCH 30.8 27.9 - 33.1 pg    MCHC 33.2 32.8 - 36.0 g/dL    RDW 15.7 (H) 10.9 - 15.1 %    PLATELETS 156 140 - 440 x10^3/uL    MPV 9.0 6.0 - 10.2 fL    NEUTROPHIL % 73 %    LYMPHOCYTE % 15 %    MONOCYTE % 9 %    EOSINOPHIL % 2 %    BASOPHIL % 1 %    NEUTROPHIL # 4.80 1.60 - 5.50 x10^3/uL    LYMPHOCYTE # 1.00 0.80 - 3.20 x10^3/uL    MONOCYTE # 0.60 0.20 - 0.80 x10^3/uL    EOSINOPHIL # 0.10 0.00 - 0.50 x10^3/uL    BASOPHIL # 0.00 0.00 - 0.20 x10^3/uL   POC BLOOD GLUCOSE (RESULTS)   Result Value Ref Range    GLUCOSE, POC 73 70 - 110 mg/dL    Narrative    Fingerstick Acquired       SIGNIFICANT RADIOLOGY:    CONSULTATIONS:  Cardiology  PROCEDURES PERFORMED:  Cath lab    DOES PATIENT HAVE ADVANCED DIRECTIVES:  Yes, Patient Does Have Advance Directive for Healthcare Treatment      CONDITION ON DISCHARGE: Alert and VS Stable    DIS[facility]GE DISPOSITION:  [facility]    Copies sent to Care Team       Relationship Specialty Notifications Start End    VMaida Sale MD PCP - General EXTERNAL  11/21/13     Phone: [phone]: [phone]        [address] [id number]   VMaida Sale MD PCP - Secondary Care EXTERNAL  11/11/13     Phone: [phone]: [phone]        [address] [id number]         MVedia Pereyra MD  05/19/2017, 18:19

## 2017-05-19 NOTE — H&P (Signed)
[facility]  DEPARTMENT OF MEDICINE  [place], [place] [address]    [name] is a 76 y.o. male  With AFib CHADS score 6 HAS BLED 3 ischemic cardiomyopathy ejection fraction 20-25% with a partially reversible defect, he has an AICD and status post AV node ablation.  He was in euvolemic state for a long time and then lately has noticed more swelling of his feet.  His diuretics were up titrated and his creatinine came back at 1.9.  His fluid status is likely related to dietary indiscretions.  On account of his abnormal nuclear examination a cardiac catheterization was planned right and left, this had to be rescheduled due to scheduling conflicts.  It should be noted the patient requests no sedation for the catheterization as he gets paradoxically hyperactive when he gets sedation.  He had frequent falls in the past as a result of which were considering a Watchman device but lately he has quit falling.    No Angina    Previous diagnostic testing for coronary artery disease includes cardiac catheterization, echocardiogram, EPS study.    Dyspnea  Evaluation of shortness of breath. Dyspnea has been mild, and generally lasting minutes. Episodes usually occur with exertion and have been stable.    No Palpitations     No Syncope    No Claudication    Edema  He has had a lot of edema and the doctors at [facility] general had given him a 'super diuretic' for a few days and his edema is now better         Past Medical History:   Diagnosis Date   . BPH (benign prostatic hypertrophy)    . CAD (coronary artery disease) 09/25/2011   . CHF (NYHA class II, ACC/AHA stage C) (CMS HCC) 10/24/2013   . CVA (cerebrovascular accident) (CMS HCC)    . Dementia    . Diabetes mellitus (CMS HCC)    . Diabetes mellitus, type 2 (CMS HCC)    . HTN (hypertension)    . MI (myocardial infarction) (CMS HCC)    . Renal stones    . Wears dentures    . Wears glasses          Family Medical History:      Problem Relation (Age of Onset)    Cancer Sister    Coronary Artery Disease Other    Diabetes Mother, Sister    High Cholesterol Father    Hypertension Father                   Current Outpatient Prescriptions   Medication Sig Dispense Refill   . alum-mag hydroxide-simethicone (MAALOX MAX) 400-400-40 mg/5 mL Oral Suspension Take 15 mL by mouth Four times a day as needed     . ammonium lactate (GERI-HYDROLAC) 12 % Cream      . ammonium lactate (LAC-HYDRIN) 12 % Lotion Apply topically Twice daily rub in to affected area well     . apixaban (ELIQUIS) 5 mg Oral Tablet Take 5 mg by mouth Twice daily     . aspirin (ECOTRIN) 81 mg Oral Tablet, Delayed Release (E.C.) Take 81 mg by mouth Once a day     . bacitracin zinc (ANTIBIOTIC, BACITRACIN ZINC,) 500 unit/gram Ointment by Apply Topically route Twice per day as needed     . bisacodyl (DULCOLAX, BISACODYL,) 5 mg Oral Tablet, Delayed Release (E.C.) Take 5 mg by mouth Every 24 hours as needed for Constipation     .  carvedilol (COREG) 6.25 mg Oral Tablet Take 1 Tab (6.25 mg total) by mouth Twice daily with food (Patient taking differently: Take 12.5 mg by mouth Twice daily with food ) 60 Tab 3   . cholecalciferol, vitamin D3, 1,000 unit Oral Tablet Take 1,000 Units by mouth Once a day     . Coenzyme Q10 (CO Q-10) 100 mg Oral Capsule Take by mouth Once a day      . digoxin (LANOXIN) 125 mcg Oral Tablet Take 0.125 mg by mouth Once a day     . donepezil (ARICEPT) 10 mg Oral Tablet Take 10 mg by mouth Every night     . Ferrous Sulfate 142 mg (45 mg iron) Oral Tablet Sustained Release Take by mouth     . fluticasone propionate (FLUTICASONE NASL) by Nasal route     . folic acid (FOLVITE) 1 mg Oral Tablet Take 1 mg by mouth Once a day     . furosemide (LASIX) 40 mg Oral Tablet Take 40 mg by mouth Twice daily      . insulin glargine (LANTUS) 100 unit/mL Subcutaneous injection (vial) 15 Units by Subcutaneous route Every night     . losartan  (COZAAR) 25 mg Oral Tablet Take 100 mg by mouth Once a day      . metFORMIN (GLUCOPHAGE) 500 mg Oral Tablet Take 500 mg by mouth Twice daily with food      . multivitamin Oral Tablet Take 1 Tab by mouth Once a day     . nitroglycerin (NITROSTAT) 0.4 mg Sublingual Tablet, Sublingual 1 Tab (0.4 mg total) by Sublingual route Every 5 minutes as needed for Chest pain. for 3 doses over 15 minutes 30 Tab 5   . Omega-3 Fatty Acids-Vitamin E (FISH OIL) 1,000 mg Oral Capsule Take 1,000 mg by mouth Once a day     . ranitidine (ZANTAC) 150 mg Oral Tablet Take 150 mg by mouth Once a day        No current facility-administered medications for this visit.            Allergies   Allergen Reactions   . Lipitor [Atorvastatin]      Concerned about cognitive function.   . Pravastatin Rash     Social History           Social History   . Marital status: Divorced     Spouse name: N/A   . Number of children: N/A   . Years of education: N/A     Occupational History   . Not on file.          Social History Main Topics   . Smoking status: Never Smoker   . Smokeless tobacco: Never Used   . Alcohol use No   . Drug use: No   . Sexual activity: Not on file          Other Topics Concern   . Right Hand Dominant Yes     Social History Narrative       Review of Systems  Constitutional: negative  Eyes: negative for blurry vision  Ears, nose, mouth, throat, and face: negative his ears plug up from time to time  Respiratory: negative for cough, sputum, asthma, or wheezing  Cardiovascular: negative for near-syncope, syncope, and claudication  Gastrointestinal: negative for abdominal pain  Genitourinary: negative  Integument/breast: negative  Hematologic/lymphatic: negative for easy bruising  Musculoskeletal: negative for arthralgias or joint stiffness  Neurological: negative for memory problems  Behavioral/Psych: negative for  anxiety or depression  Endocrine: negative  Allergic/Immunologic: negative    Objective:     There were  no vitals taken for this visit.  General:  Alert, cooperative, no distress, appears stated age.   Oropharynx: Normal.   Neck: Supple. No JVD.   Lung: Clear to auscultation bilaterally.   Heart:  Regular rate and rhythm, S1, S2 normal, no murmur, click, rub or gallop.   Abdomen: Soft, non-tender. Bowel sounds normal. No masses,  no organomegaly.   Extremities: Extremities normal, atraumatic, no cyanosis or edema.   Pulses: Diminished pulses   Skin:  Warm and dry, no hyperpigmentation, vitiligo, or suspicious lesions.   Neuro: Normal without focal findings, mental status normal, speech normal, alert and oriented x3,PERRLA, reflexes normal and symmetric.     Cardiographics  MPS 03/2017    Cath 2013  history of coronary artery disease with stenting of his LAD as well as RCA in 2013    TTE Jun 2018      TTE Nov 2017      Most recent AICD check 2017    SJM #[id number] CRT-D.  Mode=DDD@70  bpm.  Battery status etr=4.0years.  A: 390 ohms, P=nt, threshold=nt[output=auto ].  RV: 340 ohms, R= >12 mV, threshold=nt [output=2.5V and 0.29ms ].  LV: 550 ohms, threshold=nt [output=2.5V and 0.22ms ].  Ap=<1% ,Vp= 97% . AF burden=>99%.  VF detections=200 bpm. No recent vt/vf episodes.  No new alerts. Normal device check.  Will recheck again in 6 months from home by remote transmission.  Darlyn Chamber, RN 12.28.17        AICD implant [phone]  PLAN: The device is programmed to provide DDD pacing between 70-120 ppm. This is currently programmed to be a single zone device detecting ventricular fibrillation as rates exceeding 200 beats per minute and delivering high output therapies.    We will initiate oral amiodarone to attempt to maintain a sinus mechanism.      Jackquline Bosch, MD  Associate Professor, Section of Cardiology  [facility] Department of Medicine    AV Node Ablation 2017  IMPRESSION AND PLAN: Acutely successful AV node ablation for refractory atrial fibrillation. The patient will be monitored with bedrest for  the next 4 hours and will be discharged to home. He will return to my office for followup in 6-8 weeks.      Hazle Quant, MD  Assistant Professor  [facility] Department of Medicine, Section of Cardiology            Assessment:     Patient is a 76 y.o. male who presents for follow-up of CAD cardiomyopathy AFib with fall risk. He has seen structural heart however has stopped falling as he is in a wheelchair most of the time now.    Plan:   (I48.91) Atrial fibrillation (CMS HCC)  (primary encounter diagnosis)  Continue Eliquis    (I25.5) Ischemic cardiomyopathy (I25.10) CAD (coronary artery disease) (I44.7) LBBB (left bundle branch block)  Patient's MPS is abnormal in the setting of significant LV dysfunction with a prior history of stenting of the procedure diagnostic right and left heart catheterization.  Patient notes that on the previous episode he had paradoxic agitation with use of sedatives and had lipids IVs out and walked out of the hospital.  This time it plan on doing a groin approach and  Avoid sedation altogether.  Catheter setup with Dr Dellia Beckwith and a staff message was sent to him with patient request.   Repeat BASIC METABOLIC PANEL, FASTING has been  ordered and patient insisted are aware with a creatinine to worsen the catheterization may need to be rescheduled yet again.    (I10) Essential hypertension  Plan: BASIC METABOLIC PANEL, FASTING    (E78.2) Mixed hyperlipidemia  Goal LDL less than 70    Follow-up in a month.        Orders Placed This Encounter   . CBC/DIFF   . COMPREHENSIVE METABOLIC PANEL, NON-FASTING         Electronically signed by Thornell Sartorius, MD at 05/06/17 1422   Electronically signed by Thornell Sartorius, MD at 05/06/17 1435

## 2017-05-19 NOTE — Interval H&P Note (Signed)
[facility]      H&P UPDATE FORM                                                                                  Siranthony, Ernandez, 76 y.o. male  Date of Admission:  05/19/2017  Date of Birth:      05/19/2017    STOP: IF H&P IS GREATER THAN 30 DAYS FROM SURGICAL DAY COMPLETE NEW H&P IS REQUIRED.     H & P updated the day of the procedure.  1.  H&P completed within 30 days of surgical procedure  and has been reviewed within 24 hours of the surgery, the patient has been examined, and no change has occured in the patients condition since the H&P was completed.       Change in medications: No     No LMP for male patient.      Comments:     2.  Patient continues to be appropriate candidate for planned surgical procedure. YES    Patient has elevated creatinine he was rehydrated with normal saline at 150 cc/hour prior to the procedure.  We will plan to do this procedure with minimal amount or no sedation if possible.  Her older via a right femoral approach.        Vincent Gros, MD

## 2017-05-19 NOTE — Ancillary Notes (Signed)
Upon RN arrival in room for ed, pt lying in bed with side rails up, resting quietly. Awakened & talked to pt about CAD & asked him his understanding of why he was going to [facility]. Pt states he may need another stent. He viewed the stent board & the stent & verbalized why he has a stent. Pt needed redirected to topic of conversation a couple times but appeared engaged in discussion. He was not aware of potential for CABG if stent not an option, so discussed how a bypass is done & how that may be an option once transferred. Pt stated understanding. He stated he had a heart attack, therefore discussed MI & recovery. Showed pt pictures of normal heart, & one with blockages in cardiac booklet. Pt stated he would look over the rest of booklet on his own.

## 2017-05-19 NOTE — Care Plan (Signed)
Problem: Patient Care Overview (Adult,OB)  Goal: Plan of Care Review(Adult,OB)  The patient and/or their representative will communicate an understanding of their plan of care   Outcome: Ongoing (see interventions/notes)    Goal: Individualization/Patient Specific Goal(Adult/OB)  Outcome: Ongoing (see interventions/notes)    Goal: Interdisciplinary Rounds/Family Conf  Outcome: Ongoing (see interventions/notes)      Problem: Anxiety (Adult)  Goal: Identify Related Risk Factors and Signs and Symptoms  Related risk factors and signs and symptoms are identified upon initiation of Human Response Clinical Practice Guideline (CPG).   Outcome: Ongoing (see interventions/notes)    Goal: Reduction/Resolution  Patient will demonstrate the desired outcomes by discharge/transition of care.   Outcome: Ongoing (see interventions/notes)      Problem: Fear (Adult)  Goal: Identify Related Risk Factors and Signs and Symptoms  Related risk factors and signs and symptoms are identified upon initiation of Human Response Clinical Practice Guideline (CPG).   Outcome: Ongoing (see interventions/notes)    Goal: Self-Control  Patient will demonstrate the desired outcomes by discharge/transition of care.   Outcome: Ongoing (see interventions/notes)      Problem: Thought Process Alteration (Adult)  Goal: Identify Related Risk Factors and Signs and Symptoms  Related risk factors and signs and symptoms are identified upon initiation of Human Response Clinical Practice Guideline (CPG).   Outcome: Ongoing (see interventions/notes)    Goal: Improved Thought Process  Patient will demonstrate the desired outcomes by discharge/transition of care.   Outcome: Ongoing (see interventions/notes)      Problem: Health Knowledge, Opportunity to Enhance (Adult,Obstetrics,Pediatric)  Goal: Identify Related Risk Factors and Signs and Symptoms  Related risk factors and signs and symptoms are identified upon initiation of Human Response Clinical Practice Guideline  (CPG).   Outcome: Ongoing (see interventions/notes)    Goal: Knowledgeable about Health Subject/Topic  Patient will demonstrate the desired outcomes by discharge/transition of care.   Outcome: Ongoing (see interventions/notes)      Problem: Perioperative Period (Adult)  Prevent and manage potential problems including:1. bleeding2. gastrointestinal complications3. hypothermia4. infection5. pain6. perioperative injury7. respiratory compromise8. situational response9. urinary retention10. venous thromboembolism11. wound complications   Goal: Signs and Symptoms of Listed Potential Problems Will be Absent or Manageable (Perioperative Period)  Signs and symptoms of listed potential problems will be absent or manageable by discharge/transition of care (reference Perioperative Period (Adult) CPG).   Outcome: Ongoing (see interventions/notes)      Problem: Fall Risk (Adult)  Goal: Identify Related Risk Factors and Signs and Symptoms  Related risk factors and signs and symptoms are identified upon initiation of Human Response Clinical Practice Guideline (CPG).   Outcome: Ongoing (see interventions/notes)    Goal: Absence of Falls  Patient will demonstrate the desired outcomes by discharge/transition of care.   Outcome: Ongoing (see interventions/notes)      Problem: Skin Injury Risk (Adult,Obstetrics,Pediatric)  Goal: Identify Related Risk Factors and Signs and Symptoms  Related risk factors and signs and symptoms are identified upon initiation of Human Response Clinical Practice Guideline (CPG).   Outcome: Ongoing (see interventions/notes)    Goal: Skin Health and Integrity  Patient will demonstrate the desired outcomes by discharge/transition of care.   Outcome: Ongoing (see interventions/notes)      Problem: Cardiac Catheterization (Diagnostic/Interventional) (Adult)  Prevent and manage potential problems including:  1. cardiopulmonary complications  2. embolism  3. infection  4. pain  5. situational response  6. systemic  reaction  7. vascular access complications  Goal: Signs and Symptoms of Listed Potential Problems Will be  Absent, Minimized or Managed (Cardiac Catheterization)  Signs and symptoms of listed potential problems will be absent, minimized or managed by discharge/transition of care (reference Cardiac Catheterization (Diagnostic/Interventional) (Adult) CPG).  Outcome: Ongoing (see interventions/notes)    Goal: Anesthesia/Sedation Recovery  Outcome: Ongoing (see interventions/notes)      Comments: Received  Post cardiac  Cath    Unable  To  Insert  Stents  At  [place]  Awaiting  Transfer to  [place] ,  Right  Groin  Dressing  Clean   Dry   Intact .  All pedal pulses  Obtainable  Only  With  Doppler  , patient  Very  Hard  Of  Hearing  Must  Talk  Loudly  ,  Patient  Very  Forgetful , disoriented  To  Total  Situation ,  will  Continue  To  Monitor  Cath  Site  ,  Pedals  ,  Labs  ,  Vitals  , cardiac  Rhythm Wendie Agreste, RN

## 2017-05-19 NOTE — Care Management Notes (Signed)
Dr. Sara Chu [place].       QSOFA SCREENING    qSOFA Score >= 2 = Positive              Value               Score    Respiratory Rate >= 22 breaths per minute = 1 point 20 0   Systolic Blood Pressure <= 100 mmHg = 1 point 142 0   Altered Mental Status: GCS <15 = 1 point Mild dementia, at baseline 0   Total Score  0     Serum Lactate Level > = 1mmol/L (36 mg/ dL)= Positive:  Initial call level (if not available put NA) na   Followed up call level         Positive Screen: no    Rosebud Poles, RN

## 2017-05-19 NOTE — Care Plan (Signed)
Problem: Fall Risk (Adult)  Goal: Identify Related Risk Factors and Signs and Symptoms  Related risk factors and signs and symptoms are identified upon initiation of Human Response Clinical Practice Guideline (CPG).   Outcome: Ongoing (see interventions/notes)    Goal: Absence of Falls  Patient will demonstrate the desired outcomes by discharge/transition of care.   Outcome: Ongoing (see interventions/notes)      Problem: Skin Injury Risk (Adult,Obstetrics,Pediatric)  Goal: Identify Related Risk Factors and Signs and Symptoms  Related risk factors and signs and symptoms are identified upon initiation of Human Response Clinical Practice Guideline (CPG).   Outcome: Ongoing (see interventions/notes)    Goal: Skin Health and Integrity  Patient will demonstrate the desired outcomes by discharge/transition of care.   Outcome: Ongoing (see interventions/notes)      Problem: Cardiac Catheterization (Diagnostic/Interventional) (Adult)  Prevent and manage potential problems including:  1. cardiopulmonary complications  2. embolism  3. infection  4. pain  5. situational response  6. systemic reaction  7. vascular access complications   Goal: Signs and Symptoms of Listed Potential Problems Will be Absent, Minimized or Managed (Cardiac Catheterization)  Signs and symptoms of listed potential problems will be absent, minimized or managed by discharge/transition of care (reference Cardiac Catheterization (Diagnostic/Interventional) (Adult) CPG).   Outcome: Ongoing (see interventions/notes)    Goal: Anesthesia/Sedation Recovery  Outcome: Ongoing (see interventions/notes)

## 2017-05-19 NOTE — H&P (Signed)
[facility]  Hospitalist Admission H&P    [name], Jim, 76 y.o. male  Encounter Start Date:  05/19/2017  Inpatient Admission Date:    Date of Birth:      Information Obtained from: patient, spouse and history reviewed via medical record      HPI: [name] is a 76 y.o., White male who presented outpatient with Dr. Lyndee Leo for cath lab today.  The patient does have a longstanding history of atrial fibrillation.  The patient also has congestive heart failure.  The patient's heart failure has been worsening recently.  Patient has been having worsening edema symptoms and shortness of breath.  Patient was taken to cath lab today.  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  Patient is going to be transferred to [facility] for further management.  The patient is going to be held in the ICU at [facility] while awaiting a bed at [facility].  The patient is being started on IV Lasix and will be started on metolazone.  Patient is currently denying any nausea or vomiting.  He is breathing near his baseline.  He is denying any fevers or chills.  All other review of systems are negative except as stated in HPI.    Past Medical History:   Diagnosis Date   . Automatic implantable cardioverter-defibrillator in situ    . BPH (benign prostatic hypertrophy)    . CAD (coronary artery disease) 09/25/2011   . Cataract    . CHF (NYHA class II, ACC/AHA stage C) (CMS HCC) 10/24/2013   . CVA (cerebrovascular accident) (CMS [place])    . Dementia    . Diabetes mellitus (CMS [place])    . Diabetes mellitus, type 2 (CMS HCC)    . Disorder of prostate    . Esophageal reflux    . HTN (hypertension)    . MI (myocardial infarction) (CMS [place])    . Pacemaker    . Renal stones    . Stented coronary artery    . Type 2 diabetes mellitus (CMS HCC)    . Wears dentures    . Wears glasses          Past Surgical History:   Procedure Laterality Date   . CORONARY ARTERY ANGIOPLASTY      . HX ADENOIDECTOMY     . HX APPENDECTOMY     . HX CARDIAC ABLATION     . HX HEART CATHETERIZATION     . HX LITHOTRIPSY     . HX PACEMAKER DEFIBRILLATOR PLACEMENT     . HX TONSILLECTOMY           Medications Prior to Admission     Prescriptions    alum-mag hydroxide-simethicone (MAALOX MAX) 400-400-40 mg/5 mL Oral Suspension    Take 15 mL by mouth Four times a day as needed    ammonium lactate (GERI-HYDROLAC) 12 % Cream    ammonium lactate (LAC-HYDRIN) 12 % Lotion    Apply topically Twice daily rub in to affected area well    apixaban (ELIQUIS) 5 mg Oral Tablet    Take 5 mg by mouth Twice daily    aspirin (ECOTRIN) 81 mg Oral Tablet, Delayed Release (E.C.)    Take 81 mg by mouth Once a day    bacitracin zinc (ANTIBIOTIC, BACITRACIN ZINC,) 500 unit/gram Ointment    by Apply Topically route Twice per day as needed    bisacodyl (DULCOLAX, BISACODYL,) 5 mg Oral Tablet,  Delayed Release (E.C.)    Take 5 mg by mouth Every 24 hours as needed for Constipation    carvedilol (COREG) 6.25 mg Oral Tablet    Take 1 Tab (6.25 mg total) by mouth Twice daily with food    Patient taking differently:  Take 12.5 mg by mouth Twice daily with food     cholecalciferol, vitamin D3, 1,000 unit Oral Tablet    Take 1,000 Units by mouth Once a day    Coenzyme Q10 (CO Q-10) 100 mg Oral Capsule    Take by mouth Once a day     digoxin (LANOXIN) 125 mcg Oral Tablet    Take 0.125 mg by mouth Once a day    donepezil (ARICEPT) 10 mg Oral Tablet    Take 10 mg by mouth Every night    Ferrous Sulfate 142 mg (45 mg iron) Oral Tablet Sustained Release    Take by mouth    fluticasone propionate (FLUTICASONE NASL)    by Nasal route    folic acid (FOLVITE) 1 mg Oral Tablet    Take 1 mg by mouth Once a day    furosemide (LASIX) 40 mg Oral Tablet    Take 40 mg by mouth Twice daily     insulin glargine (LANTUS) 100 unit/mL Subcutaneous injection (vial)    15 Units by Subcutaneous route Every night    losartan (COZAAR) 25 mg Oral Tablet    Take 100 mg by  mouth Once a day     metFORMIN (GLUCOPHAGE) 500 mg Oral Tablet    Take 500 mg by mouth Twice daily with food     multivitamin Oral Tablet    Take 1 Tab by mouth Once a day    nitroglycerin (NITROSTAT) 0.4 mg Sublingual Tablet, Sublingual    1 Tab (0.4 mg total) by Sublingual route Every 5 minutes as needed for Chest pain. for 3 doses over 15 minutes    Omega-3 Fatty Acids-Vitamin E (FISH OIL) 1,000 mg Oral Capsule    Take 1,000 mg by mouth Once a day    ranitidine (ZANTAC) 150 mg Oral Tablet    Take 150 mg by mouth Once a day         Allergies   Allergen Reactions   . Lipitor [Atorvastatin]      Concerned about cognitive function.   . Pravastatin Rash     Social History     Social History   . Marital status: Divorced     Spouse name: N/A   . Number of children: N/A   . Years of education: N/A     Social History Main Topics   . Smoking status: Never Smoker   . Smokeless tobacco: Never Used   . Alcohol use No   . Drug use: No   . Sexual activity: Yes     Birth control/ protection: Condom     Other Topics Concern   . Right Hand Dominant Yes   . Ability To Walk 2 Flight Of Steps Without Sob/Cp Yes     Social History Narrative     Past Family History:     Family Medical History:     Problem Relation (Age of Onset)    Cancer Sister    Coronary Artery Disease Other    Diabetes Mother, Sister    High Cholesterol Father    Hypertension Father              ROS: Other than ROS in the HPI,  all other systems were negative.    EXAM:  Temperature: 36.7 C (98.1 F)  Heart Rate: 81  BP (Non-Invasive): (!) 140/94  Respiratory Rate: (!) 32  SpO2-1: 99 %  Pain Score (Numeric, Faces): 0  Constitutional:  In bed, alert, cooperative, no acute distress, wife at bedside  Respiratory:  Moderate air entry, mildly tachypneic, mostly clear  Cardiovascular:  Regular rate   Gastrointestinal:  Bowel sounds positive  Extremities:  2+ pitting edema  Integumentary:  Warm and dry  Neurologic:  Awake, motor intact    LABS:    Lab Results Today:     Results for orders placed or performed during the hospital encounter of 05/19/17 (from the past 24 hour(s))   POC BLOOD GLUCOSE (RESULTS)   Result Value Ref Range    GLUCOSE, POC 73 70 - 110 mg/dL   ARTERIAL BLOOD GAS/CO-OX   Result Value Ref Range    %FIO2 (ARTERIAL) 32 %    PH (ARTERIAL) 7.42 7.35 - 7.45    PCO2 (ARTERIAL) 38.0 35.0 - 48.0 mm/Hg    PO2 (ARTERIAL) 96.0 80.0 - 100.0 mm/Hg    BICARBONATE (ARTERIAL) 25.1 (H) 22.0-28.0 mmol/L mmol/L    BASE EXCESS (ARTERIAL) 0.3 -2.0 - 2.0 mmol/L    O2 SATURATION (ARTERIAL) 99.2 (H) 95.0 - 98.0 %    TCO2 25.8     PH (T) 7.42     PCO2 (ARTERIAL - TEMP CORRECTED) 38.0 mm/Hg    PO2 (ARTERIAL - TEMP CORRECTED 96     TEMPERATURE, COMP 37.0 15.0 - 45.0 C    HEMOGLOBIN 13.4 g/dL    CARBOXYHEMOGLOBIN 2.7 0.0 - 3.0 %    MET-HEMOGLOBIN 1.0 0.0 - 1.5 %    OXYHEMOGLOBIN 95.6 95.0 - 98.0 %   VENOUS BLOOD GAS/CO-OX   Result Value Ref Range    CARBOXYHEMOGLOBIN 1.9 0.0 - 3.0 %    %FIO2 (VENOUS) 32.0 %    HEMOGLOBIN 13.3 g/dL    BICARBONATE (VENOUS) 24.6 23.0 - 33.0 mmol/L    MET-HEMOGLOBIN 1.1 0.0 - 1.5 %    OXYHEMOGLOBIN 42.5 (L) 95.0 - 98.0 %    PCO2 (VENOUS) 43.00 38.00 - 50.00 mm/Hg    PH (TEMPERATURE CORRECTED) 7.4     PO2 (VENOUS - TEMP CORRECTED) 26.0 mm/Hg    TEMPERATURE, COMP 37.0 15.0 - 45.0 C    O2 SATURATION (VENOUS) 43.8 (L) 95.0 - 98.0 %    PH (VENOUS) 7.40 7.32 - 7.45    PO2 (VENOUS) 26.0 (L) 30.0 - 50.0 mm/Hg   B-TYPE NATRIURETIC PEPTIDE   Result Value Ref Range    BNP 1420 (H) 0 - 100 pg/mL   BASIC METABOLIC PANEL   Result Value Ref Range    SODIUM 142 135 - 145 mmol/L    POTASSIUM 4.5 3.5 - 5.0 mmol/L    CHLORIDE 106 98 - 111 mmol/L    CO2 TOTAL 25 21 - 35 mmol/L    ANION GAP 11 mmol/L    CALCIUM 9.2 8.2 - 10.2 mg/dL    GLUCOSE 126 (H) 70 - 110 mg/dL    BUN 33 (H) 10 - 25 mg/dL    CREATININE 1.97 (H) <=1.30 mg/dL    BUN/CREA RATIO 17     ESTIMATED GFR 33 (L) Avg: 75 mL/min/1.38m2   CBC WITH DIFF   Result Value Ref Range    WBC 6.6 3.3 - 9.3 x10^3/uL    RBC 4.60  4.40 - 5.68 x10^6/uL    HGB 14.1 13.4 -  17.3 g/dL    HCT 42.6 38.9 - 50.5 %    MCV 92.7 82.4 - 95.0 fL    MCH 30.8 27.9 - 33.1 pg    MCHC 33.2 32.8 - 36.0 g/dL    RDW 15.7 (H) 10.9 - 15.1 %    PLATELETS 156 140 - 440 x10^3/uL    MPV 9.0 6.0 - 10.2 fL    NEUTROPHIL % 73 %    LYMPHOCYTE % 15 %    MONOCYTE % 9 %    EOSINOPHIL % 2 %    BASOPHIL % 1 %    NEUTROPHIL # 4.80 1.60 - 5.50 x10^3/uL    LYMPHOCYTE # 1.00 0.80 - 3.20 x10^3/uL    MONOCYTE # 0.60 0.20 - 0.80 x10^3/uL    EOSINOPHIL # 0.10 0.00 - 0.50 x10^3/uL    BASOPHIL # 0.00 0.00 - 0.20 x10^3/uL           There is no immunization history on file for this patient.    DNR Status:  Full Code      ASSESSMENT/PLAN:    Active Hospital Problems    Diagnosis   . Primary Problem:  Acute on chronic Systolic congestive heart failure, NYHA class 2 (CMS HCC)  Patient is being followed by Cardiology and is awaiting transfer to [facility].  Lasix IV 40 mg b.i.d.  Start metolazone 5 mg daily  Monitor renal function carefully   . Stage 3 chronic kidney disease (CMS HCC)  Will give p.o. Mucomyst for renal protection after having die with cath lab earlier today  Renally dose medications  Monitor electrolytes   . Ischemic cardiomyopathy  Patient is being transferred to [facility]   . Atrial fibrillation (CMS HCC)  Rate is currently controlled   . CAD (coronary artery disease)  Cardiology is following   . Diabetes mellitus (CMS HCC)  Hold metformin  Lantus  Insulin sliding scale     DVT/PE Prophylaxis: Apixaban  Disposition Planning: Awaiting transfer to [place], MD  05/19/2017, 14:10'

## 2017-05-20 ENCOUNTER — Inpatient Hospital Stay (HOSPITAL_COMMUNITY): Payer: Medicare Other

## 2017-05-20 ENCOUNTER — Inpatient Hospital Stay
Admission: EM | Admit: 2017-05-20 | Discharge: 2017-05-27 | DRG: 291 | Disposition: A | Discharge status: Skilled Nursing Facility | Payer: Medicare Other | Source: Other Acute Inpatient Hospital | Attending: Cardiovascular Disease | Admitting: Cardiovascular Disease

## 2017-05-20 ENCOUNTER — Encounter (HOSPITAL_COMMUNITY): Payer: Self-pay

## 2017-05-20 ENCOUNTER — Inpatient Hospital Stay (HOSPITAL_COMMUNITY): Payer: Medicare Other | Admitting: Cardiovascular Disease

## 2017-05-20 DIAGNOSIS — I130 Hypertensive heart and chronic kidney disease with heart failure and stage 1 through stage 4 chronic kidney disease, or unspecified chronic kidney disease: Principal | ICD-10-CM | POA: Diagnosis present

## 2017-05-20 DIAGNOSIS — Z66 Do not resuscitate: Secondary | ICD-10-CM | POA: Diagnosis not present

## 2017-05-20 DIAGNOSIS — Z993 Dependence on wheelchair: Secondary | ICD-10-CM

## 2017-05-20 DIAGNOSIS — Z7952 Long term (current) use of systemic steroids: Secondary | ICD-10-CM

## 2017-05-20 DIAGNOSIS — Z862 Personal history of diseases of the blood and blood-forming organs and certain disorders involving the immune mechanism: Secondary | ICD-10-CM

## 2017-05-20 DIAGNOSIS — N183 Chronic kidney disease, stage 3 unspecified (CMS HCC): Secondary | ICD-10-CM

## 2017-05-20 DIAGNOSIS — R627 Adult failure to thrive: Secondary | ICD-10-CM | POA: Diagnosis present

## 2017-05-20 DIAGNOSIS — I252 Old myocardial infarction: Secondary | ICD-10-CM

## 2017-05-20 DIAGNOSIS — I2510 Atherosclerotic heart disease of native coronary artery without angina pectoris: Secondary | ICD-10-CM

## 2017-05-20 DIAGNOSIS — I255 Ischemic cardiomyopathy: Secondary | ICD-10-CM

## 2017-05-20 DIAGNOSIS — Z515 Encounter for palliative care: Secondary | ICD-10-CM | POA: Diagnosis not present

## 2017-05-20 DIAGNOSIS — E1129 Type 2 diabetes mellitus with other diabetic kidney complication: Secondary | ICD-10-CM | POA: Diagnosis present

## 2017-05-20 DIAGNOSIS — K219 Gastro-esophageal reflux disease without esophagitis: Secondary | ICD-10-CM | POA: Diagnosis present

## 2017-05-20 DIAGNOSIS — E1122 Type 2 diabetes mellitus with diabetic chronic kidney disease: Secondary | ICD-10-CM | POA: Diagnosis present

## 2017-05-20 DIAGNOSIS — Z9049 Acquired absence of other specified parts of digestive tract: Secondary | ICD-10-CM

## 2017-05-20 DIAGNOSIS — E119 Type 2 diabetes mellitus without complications: Secondary | ICD-10-CM

## 2017-05-20 DIAGNOSIS — I4891 Unspecified atrial fibrillation: Secondary | ICD-10-CM | POA: Diagnosis present

## 2017-05-20 DIAGNOSIS — I493 Ventricular premature depolarization: Secondary | ICD-10-CM | POA: Diagnosis not present

## 2017-05-20 DIAGNOSIS — I5022 Chronic systolic (congestive) heart failure: Secondary | ICD-10-CM

## 2017-05-20 DIAGNOSIS — D509 Iron deficiency anemia, unspecified: Secondary | ICD-10-CM | POA: Diagnosis present

## 2017-05-20 DIAGNOSIS — N400 Enlarged prostate without lower urinary tract symptoms: Secondary | ICD-10-CM | POA: Diagnosis present

## 2017-05-20 DIAGNOSIS — I340 Nonrheumatic mitral (valve) insufficiency: Secondary | ICD-10-CM

## 2017-05-20 DIAGNOSIS — I4510 Unspecified right bundle-branch block: Secondary | ICD-10-CM | POA: Diagnosis present

## 2017-05-20 DIAGNOSIS — I10 Essential (primary) hypertension: Secondary | ICD-10-CM | POA: Diagnosis present

## 2017-05-20 DIAGNOSIS — I5023 Acute on chronic systolic (congestive) heart failure: Secondary | ICD-10-CM

## 2017-05-20 DIAGNOSIS — R9431 Abnormal electrocardiogram [ECG] [EKG]: Secondary | ICD-10-CM

## 2017-05-20 DIAGNOSIS — I509 Heart failure, unspecified: Secondary | ICD-10-CM

## 2017-05-20 DIAGNOSIS — F0390 Unspecified dementia without behavioral disturbance: Secondary | ICD-10-CM | POA: Diagnosis present

## 2017-05-20 DIAGNOSIS — Z87442 Personal history of urinary calculi: Secondary | ICD-10-CM

## 2017-05-20 DIAGNOSIS — I259 Chronic ischemic heart disease, unspecified: Secondary | ICD-10-CM

## 2017-05-20 DIAGNOSIS — Z86010 Personal history of colonic polyps: Secondary | ICD-10-CM

## 2017-05-20 DIAGNOSIS — Z95810 Presence of automatic (implantable) cardiac defibrillator: Secondary | ICD-10-CM

## 2017-05-20 DIAGNOSIS — Z8342 Family history of familial hypercholesterolemia: Secondary | ICD-10-CM

## 2017-05-20 DIAGNOSIS — Z9861 Coronary angioplasty status: Secondary | ICD-10-CM

## 2017-05-20 DIAGNOSIS — J9811 Atelectasis: Secondary | ICD-10-CM

## 2017-05-20 DIAGNOSIS — Z7901 Long term (current) use of anticoagulants: Secondary | ICD-10-CM

## 2017-05-20 DIAGNOSIS — Z833 Family history of diabetes mellitus: Secondary | ICD-10-CM

## 2017-05-20 DIAGNOSIS — Z794 Long term (current) use of insulin: Secondary | ICD-10-CM

## 2017-05-20 DIAGNOSIS — Z9981 Dependence on supplemental oxygen: Secondary | ICD-10-CM

## 2017-05-20 DIAGNOSIS — I4819 Other persistent atrial fibrillation: Secondary | ICD-10-CM | POA: Diagnosis present

## 2017-05-20 DIAGNOSIS — Z792 Long term (current) use of antibiotics: Secondary | ICD-10-CM

## 2017-05-20 DIAGNOSIS — E876 Hypokalemia: Secondary | ICD-10-CM | POA: Diagnosis present

## 2017-05-20 DIAGNOSIS — I272 Pulmonary hypertension, unspecified: Secondary | ICD-10-CM | POA: Diagnosis present

## 2017-05-20 DIAGNOSIS — I081 Rheumatic disorders of both mitral and tricuspid valves: Secondary | ICD-10-CM | POA: Diagnosis present

## 2017-05-20 DIAGNOSIS — H269 Unspecified cataract: Secondary | ICD-10-CM | POA: Diagnosis present

## 2017-05-20 DIAGNOSIS — Z79899 Other long term (current) drug therapy: Secondary | ICD-10-CM

## 2017-05-20 DIAGNOSIS — Z7982 Long term (current) use of aspirin: Secondary | ICD-10-CM

## 2017-05-20 DIAGNOSIS — R0989 Other specified symptoms and signs involving the circulatory and respiratory systems: Secondary | ICD-10-CM

## 2017-05-20 DIAGNOSIS — E785 Hyperlipidemia, unspecified: Secondary | ICD-10-CM | POA: Diagnosis present

## 2017-05-20 DIAGNOSIS — K5900 Constipation, unspecified: Secondary | ICD-10-CM | POA: Diagnosis present

## 2017-05-20 DIAGNOSIS — I517 Cardiomegaly: Secondary | ICD-10-CM

## 2017-05-20 DIAGNOSIS — Z8249 Family history of ischemic heart disease and other diseases of the circulatory system: Secondary | ICD-10-CM

## 2017-05-20 DIAGNOSIS — E559 Vitamin D deficiency, unspecified: Secondary | ICD-10-CM | POA: Diagnosis present

## 2017-05-20 DIAGNOSIS — Z888 Allergy status to other drugs, medicaments and biological substances status: Secondary | ICD-10-CM

## 2017-05-20 DIAGNOSIS — I472 Ventricular tachycardia: Secondary | ICD-10-CM | POA: Diagnosis not present

## 2017-05-20 DIAGNOSIS — Z713 Dietary counseling and surveillance: Secondary | ICD-10-CM

## 2017-05-20 DIAGNOSIS — Z8673 Personal history of transient ischemic attack (TIA), and cerebral infarction without residual deficits: Secondary | ICD-10-CM

## 2017-05-20 LAB — BASIC METABOLIC PANEL
ANION GAP: 11 mmol/L
ANION GAP: 13 mmol/L (ref 4–13)
BUN/CREA RATIO: 16
BUN/CREA RATIO: 17 (ref 6–22)
BUN: 30 mg/dL — ABNORMAL HIGH (ref 10–25)
BUN: 33 mg/dL — ABNORMAL HIGH (ref 8–25)
CALCIUM: 9.3 mg/dL (ref 8.2–10.2)
CALCIUM: 9.7 mg/dL (ref 8.5–10.2)
CHLORIDE: 106 mmol/L (ref 98–111)
CHLORIDE: 108 mmol/L (ref 96–111)
CO2 TOTAL: 23 mmol/L (ref 21–35)
CO2 TOTAL: 20 mmol/L — ABNORMAL LOW (ref 22–32)
CREATININE: 1.92 mg/dL — ABNORMAL HIGH (ref <=1.30)
CREATININE: 1.91 mg/dL — ABNORMAL HIGH (ref 0.62–1.27)
ESTIMATED GFR: 34 mL/min/1.73mˆ2 — ABNORMAL LOW
ESTIMATED GFR: 34 mL/min/{1.73_m2} — ABNORMAL LOW (ref >59)
GLUCOSE: 95 mg/dL (ref 70–110)
GLUCOSE: 72 mg/dL (ref 65–139)
POTASSIUM: 3.9 mmol/L (ref 3.5–5.0)
POTASSIUM: 4 mmol/L (ref 3.5–5.1)
SODIUM: 140 mmol/L (ref 135–145)
SODIUM: 141 mmol/L (ref 136–145)

## 2017-05-20 LAB — CBC WITH DIFF
BASOPHIL #: 0.1 10*3/uL (ref 0.00–0.20)
BASOPHIL #: 0.08 x10ˆ3/uL (ref 0.00–0.20)
BASOPHIL %: 1 %
BASOPHIL %: 1 %
EOSINOPHIL #: 0.1 x10ˆ3/uL (ref 0.00–0.50)
EOSINOPHIL #: 0.16 x10ˆ3/uL (ref 0.00–0.50)
EOSINOPHIL %: 2 %
EOSINOPHIL %: 2 %
HCT: 45.2 % (ref 38.9–50.5)
HCT: 44.9 % (ref 36.7–47.0)
HGB: 15.2 g/dL (ref 13.4–17.3)
HGB: 14.3 g/dL (ref 12.5–16.3)
HGB: 14.3 g/dL (ref 12.5–16.3)
LYMPHOCYTE #: 1.4 x10ˆ3/uL (ref 0.80–3.20)
LYMPHOCYTE #: 1.27 x10ˆ3/uL (ref 1.00–4.80)
LYMPHOCYTE %: 17 %
LYMPHOCYTE %: 15 %
MCH: 31.6 pg (ref 27.9–33.1)
MCH: 29.7 pg (ref 27.4–33.0)
MCHC: 33.7 g/dL (ref 32.8–36.0)
MCHC: 31.7 g/dL — ABNORMAL LOW (ref 32.5–35.8)
MCV: 93.7 fL (ref 82.4–95.0)
MCV: 93.6 fL (ref 78.0–100.0)
MONOCYTE #: 0.9 x10ˆ3/uL — ABNORMAL HIGH (ref 0.20–0.80)
MONOCYTE #: 0.95 x10ˆ3/uL (ref 0.30–1.00)
MONOCYTE %: 11 %
MONOCYTE %: 11 %
MPV: 9 fL (ref 6.0–10.2)
MPV: 8.9 fL (ref 7.5–11.5)
NEUTROPHIL #: 5.6 10*3/uL — ABNORMAL HIGH (ref 1.60–5.50)
NEUTROPHIL #: 6.18 10*3/uL (ref 1.50–7.70)
NEUTROPHIL #: 6.18 10*3/uL (ref 1.50–7.70)
NEUTROPHIL %: 69 %
NEUTROPHIL %: 72 %
PLATELETS: 190 10*3/uL (ref 140–440)
PLATELETS: 190 x10ˆ3/uL (ref 140–450)
RBC: 4.82 10*6/uL (ref 4.40–5.68)
RBC: 4.8 x10ˆ6/uL (ref 4.06–5.63)
RDW: 15.9 % — ABNORMAL HIGH (ref 10.9–15.1)
RDW: 16.2 % — ABNORMAL HIGH (ref 12.0–15.0)
WBC: 8 10*3/uL (ref 3.3–9.3)
WBC: 8.6 10*3/uL (ref 3.5–11.0)
WBC: 8.6 10*3/uL (ref 3.5–11.0)

## 2017-05-20 LAB — URINALYSIS, MICROSCOPIC
HYALINE CASTS: 17 /lpf — ABNORMAL HIGH (ref <4.0)
RBCS: 1 /hpf (ref <6.0)
WBCS: 1 /hpf (ref <4.0)
WBCS: 1 /hpf (ref <4.0)

## 2017-05-20 LAB — ECG 12-LEAD
Atrial Rate: 88 {beats}/min
Calculated R Axis: -141 degrees
QRS Duration: 164 ms
QTC Calculation: 472 ms
Ventricular rate: 76 {beats}/min

## 2017-05-20 LAB — URINALYSIS, MACROSCOPIC
BILIRUBIN: NEGATIVE mg/dL
BLOOD: NEGATIVE mg/dL
GLUCOSE: NEGATIVE mg/dL
KETONES: NEGATIVE mg/dL
SPECIFIC GRAVITY: 1.019 (ref 1.005–1.030)

## 2017-05-20 LAB — TRANSTHORACIC ECHOCARDIOGRAM - ADULT
AV mean gradient: 3
AV peak velocity post stress: 112
AVA VTI: 3
AVA Vmax: 2
Biplane Simpson EF: 27
EF: 13
Interventricular Septum Diastolic Thickness by 2D: 1.3 cm
LVIDD - 2D: 5 cm
LVIDS 2D: 4.7 cm
LVPWD: 1 cm
TR VELOCITY: 328
TR VELOCITY: 328

## 2017-05-20 LAB — VENOUS BLOOD GAS/LACTATE
%FIO2 (VENOUS): 32 %
BASE DEFICIT: 1.3 mmol/L (ref <=3.0)
BICARBONATE (VENOUS): 23.6 mmol/L (ref 22.0–26.0)
LACTATE: 2 mmol/L — ABNORMAL HIGH (ref 0.0–1.3)
PCO2 (VENOUS): 35 mm/Hg — ABNORMAL LOW (ref 41.00–51.00)
PH (VENOUS): 7.42 — ABNORMAL HIGH (ref 7.31–7.41)

## 2017-05-20 LAB — LIPID PANEL
CHOL/HDL RATIO: 3.9
CHOL/HDL RATIO: 3.9
CHOLESTEROL: 146 mg/dL (ref <200)
HDL CHOL: 37 mg/dL — ABNORMAL LOW (ref >=39)
LDL CALC: 92 mg/dL (ref <=100)
LDL CALC: 92 mg/dL (ref <=100)
NON-HDL: 109 mg/dL (ref <=190)
TRIGLYCERIDES: 87 mg/dL (ref <=150)
VLDL CALC: 17 mg/dL (ref <30)

## 2017-05-20 LAB — TYPE AND SCREEN
ABO/RH(D): A POS
ANTIBODY SCREEN: NEGATIVE

## 2017-05-20 LAB — PTT (PARTIAL THROMBOPLASTIN TIME): APTT: 34.2 seconds (ref 25.1–36.5)

## 2017-05-20 LAB — PT/INR
INR: 1.66 — ABNORMAL HIGH (ref 0.90–1.10)
INR: 1.63 — ABNORMAL HIGH (ref 0.80–1.20)

## 2017-05-20 LAB — PHOSPHORUS
PHOSPHORUS: 3.6 mg/dL (ref 2.5–4.5)
PHOSPHORUS: 3.8 mg/dL (ref 2.3–4.0)

## 2017-05-20 LAB — POC BLOOD GLUCOSE (RESULTS)
GLUCOSE, POC: 88 mg/dL (ref 70–105)
GLUCOSE, POC: 149 mg/dL — ABNORMAL HIGH (ref 70–105)
GLUCOSE, POC: 115 mg/dL — ABNORMAL HIGH (ref 70–105)
GLUCOSE, POC: 115 mg/dL — ABNORMAL HIGH (ref 70–105)
GLUCOSE, POC: 151 mg/dL — ABNORMAL HIGH (ref 70–105)
GLUCOSE, POC: 151 mg/dL — ABNORMAL HIGH (ref 70–105)

## 2017-05-20 LAB — HEPATIC FUNCTION PANEL
ALKALINE PHOSPHATASE: 201 U/L — ABNORMAL HIGH (ref <150)
AST (SGOT): 37 U/L (ref 8–48)
BILIRUBIN DIRECT: 1.1 mg/dL — ABNORMAL HIGH (ref <0.3)
BILIRUBIN TOTAL: 3 mg/dL — ABNORMAL HIGH (ref 0.3–1.3)
PROTEIN TOTAL: 6 g/dL (ref 6.0–8.0)

## 2017-05-20 LAB — MAGNESIUM
MAGNESIUM: 2 mg/dL (ref 1.8–2.3)
MAGNESIUM: 2.5 mg/dL (ref 1.6–2.5)

## 2017-05-20 LAB — THYROID STIMULATING HORMONE WITH FREE T4 REFLEX: TSH: 1.938 u[IU]/mL (ref 0.350–5.000)

## 2017-05-20 LAB — B-TYPE NATRIURETIC PEPTIDE: BNP: 2762 pg/mL — ABNORMAL HIGH (ref <=100)

## 2017-05-20 LAB — TROPONIN-I: TROPONIN I: 219 ng/L — ABNORMAL HIGH (ref 0–30)

## 2017-05-20 LAB — HGA1C (HEMOGLOBIN A1C WITH EST AVG GLUCOSE): ESTIMATED AVERAGE GLUCOSE: 169 mg/dL

## 2017-05-20 MED ORDER — DIGOXIN 125 MCG (0.125 MG) TABLET
125.0000 ug | ORAL_TABLET | Freq: Every day | ORAL | Status: DC
Start: 2017-05-20 — End: 2017-05-22
  Administered 2017-05-20 – 2017-05-21 (×2): 125 ug via ORAL
  Filled 2017-05-20 (×3): qty 1

## 2017-05-20 MED ORDER — FUROSEMIDE 10 MG/ML INJECTION SOLUTION
80.0000 mg | Freq: Two times a day (BID) | INTRAMUSCULAR | Status: DC
Start: 2017-05-20 — End: 2017-05-21
  Administered 2017-05-20 (×3): 80 mg via INTRAVENOUS
  Filled 2017-05-20 (×2): qty 8

## 2017-05-20 MED ORDER — INSULIN GLARGINE (U-100) 100 UNIT/ML SUBCUTANEOUS SOLUTION
10.00 [IU] | Freq: Every evening | SUBCUTANEOUS | Status: DC
Start: 2017-05-20 — End: 2017-05-20

## 2017-05-20 MED ORDER — PERFLUTREN LIPID MICROSPHERES 1.1 MG/ML INTRAVENOUS SUSPENSION
0.3000 mL | INTRAVENOUS | Status: AC
Start: 2017-05-21 — End: 2017-05-20
  Administered 2017-05-20: 0.3 mL via INTRAVENOUS

## 2017-05-20 MED ORDER — ASPIRIN 81 MG CHEWABLE TABLET
81.0000 mg | CHEWABLE_TABLET | Freq: Every day | ORAL | Status: DC
Start: 2017-05-20 — End: 2017-05-27
  Administered 2017-05-20 – 2017-05-27 (×8): 81 mg via ORAL
  Filled 2017-05-20 (×8): qty 1

## 2017-05-20 MED ORDER — FERROUS SULFATE 324 MG (65 MG IRON) TABLET,DELAYED RELEASE
324.00 mg | DELAYED_RELEASE_TABLET | Freq: Every morning | ORAL | Status: DC
Start: 2017-05-20 — End: 2017-05-27
  Administered 2017-05-20 – 2017-05-27 (×8): 324 mg via ORAL
  Filled 2017-05-20 (×10): qty 1

## 2017-05-20 MED ORDER — APIXABAN 5 MG TABLET
5.00 mg | ORAL_TABLET | Freq: Two times a day (BID) | ORAL | Status: DC
Start: 2017-05-20 — End: 2017-05-27
  Administered 2017-05-20 – 2017-05-27 (×15): 5 mg via ORAL
  Filled 2017-05-20 (×16): qty 1

## 2017-05-20 MED ORDER — CHOLECALCIFEROL (VITAMIN D3) 25 MCG (1,000 UNIT) TABLET
1000.0000 [IU] | ORAL_TABLET | Freq: Every day | ORAL | Status: DC
Start: 2017-05-20 — End: 2017-05-27
  Administered 2017-05-20 – 2017-05-27 (×9): 1000 [IU] via ORAL
  Filled 2017-05-20 (×8): qty 1

## 2017-05-20 MED ORDER — FOLIC ACID 1 MG TABLET
1.0000 mg | ORAL_TABLET | Freq: Every day | ORAL | Status: DC
Start: 2017-05-20 — End: 2017-05-27
  Administered 2017-05-20 – 2017-05-27 (×8): 1 mg via ORAL
  Filled 2017-05-20 (×8): qty 1

## 2017-05-20 MED ORDER — ONDANSETRON 4 MG DISINTEGRATING TABLET
4.0000 mg | ORAL_TABLET | Freq: Once | ORAL | Status: AC
Start: 2017-05-20 — End: 2017-05-20
  Administered 2017-05-20: 4 mg via ORAL
  Filled 2017-05-20: qty 1

## 2017-05-20 MED ORDER — INSULIN LISPRO 100 UNIT/ML SUB-Q SSIP
3.00 [IU] | INJECTION | Freq: Four times a day (QID) | SUBCUTANEOUS | Status: DC | PRN
Start: 2017-05-20 — End: 2017-05-27
  Administered 2017-05-20: 0 [IU] via SUBCUTANEOUS
  Administered 2017-05-21: 3 [IU] via SUBCUTANEOUS
  Administered 2017-05-21: 0 [IU] via SUBCUTANEOUS
  Administered 2017-05-21: 3 [IU] via SUBCUTANEOUS
  Administered 2017-05-23: 9 [IU] via SUBCUTANEOUS
  Administered 2017-05-23: 3 [IU] via SUBCUTANEOUS
  Administered 2017-05-24: 6 [IU] via SUBCUTANEOUS
  Administered 2017-05-25 – 2017-05-26 (×2): 3 [IU] via SUBCUTANEOUS
  Administered 2017-05-26 – 2017-05-27 (×2): 6 [IU] via SUBCUTANEOUS
  Filled 2017-05-20 (×2): qty 3

## 2017-05-20 MED ORDER — FAMOTIDINE 20 MG TABLET
20.0000 mg | ORAL_TABLET | Freq: Every day | ORAL | Status: DC
Start: 2017-05-20 — End: 2017-05-27
  Administered 2017-05-20 – 2017-05-27 (×8): 20 mg via ORAL
  Filled 2017-05-20 (×8): qty 1

## 2017-05-20 MED ORDER — INSULIN GLARGINE (U-100) 100 UNIT/ML SUBCUTANEOUS SOLUTION
5.00 [IU] | Freq: Every evening | SUBCUTANEOUS | Status: DC
Start: 2017-05-20 — End: 2017-05-21
  Administered 2017-05-20: 5 [IU] via SUBCUTANEOUS
  Filled 2017-05-20: qty 5

## 2017-05-20 MED ORDER — DONEPEZIL 5 MG TABLET
5.0000 mg | ORAL_TABLET | Freq: Every evening | ORAL | Status: DC
Start: 2017-05-20 — End: 2017-05-27
  Administered 2017-05-20 – 2017-05-25 (×5): 5 mg via ORAL
  Filled 2017-05-20 (×9): qty 1

## 2017-05-20 MED ADMIN — digoxin 125 mcg (0.125 mg) tablet: ORAL | @ 14:00:00

## 2017-05-20 MED ADMIN — perflutren lipid microspheres 1.1 mg/mL intravenous suspension: INTRAVENOUS | @ 09:00:00

## 2017-05-20 MED ADMIN — furosemide 10 mg/mL injection solution: INTRAVENOUS | @ 11:00:00

## 2017-05-20 MED ADMIN — ondansetron 4 mg disintegrating tablet: ORAL | @ 14:00:00

## 2017-05-20 MED ADMIN — nystatin 100,000 unit/gram topical powder: ORAL | @ 11:00:00 | NDC 00574200815

## 2017-05-20 NOTE — Nurses Notes (Signed)
Patient transferred from [facility] for cardiac workup following a cardiac cath procedure at the [facility]. Patient denies pain or shortness of breath at this time. Chest xray and EKG completed. Awaiting plan of care. Labs to be obtained. Sitter select in place for patient safety. Nurse call bell system reviewed with patient.

## 2017-05-20 NOTE — Consults (Signed)
[facility]                                                    SUPPORTIVE/PALLIATIVE CARE MEDICINE CONSULT    Date of Service:  05/20/2017  Requesting MD: Dr. Despina Hidden  Reason for Consult:  Goals of care clarification  Assessment & Recommendations:  Adult Failure to Thrive secondary to CHF      Discussed patient's primary medical problems, prognosis, and goals of care with patient and MPOA sister [name]. The patient would like to be DNR. He would like to continue to have his defibrillator on at this time. If his respiratory condition were to worsen in the future requiring intubation he would like to have an escalation of care to an ICU setting and a time limited trial of intubation for 1 week. If he were in shock he would like pressor therapy.     Treatment limitation per the patient include no dialysis, and no tracheostomy and PEG tube placement.     Decision Making Capacity:  Yes    Advance Directives prior to consultation:  MPOA- Yes, Name: Katherina Right ; Living Will-Yes          Patient/Family Meeting held, present were: Gretta Cool, MD, Drs. 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 Patient received a left and right heart cath by his cardiologist for worsening SOB. The cath revealed severe mulivessel disease and he was transferred to [facility] for evaluation for high risk PCI. Due to patient's global  left  ventricular dyskinesia seen on ECHO and his poor EF, the patient is not a candidate for LVAD high risk PCI. Supportive care team was consulted for goal clarification with the patient and his sister, MPOA.       Past Family, Medical, and Social History and ROS see H&P on 05/20/17 by Dr. Despina Hidden   Additional ROS:   Review of Systems - ALL NEGATIVE UNLESS BOLDED AND UNDERLINES:    Constitutional: night sweats, weight loss, fevers, fatigue, rash  Eyes: blurred vision, eye pain  Cardiac: chest pain, palpitations, syncope.   Respiratory: wheeze, dyspnea on exertion, cough, shortness of breath.  Heme/Lymph: lymphadenopathy in neck, axilla or groin.  Endocrine: polyuria, polydipsia, polyphagia.    GI: diarrhea, constipation, heartburn, nausea, vomiting.  GU: urgency, hematuria, dysuria.   Musculoskeletal: arthralgias, myalgias.  Neuro: focal weakness, loss of sensation.      Code Status:  No CPR  Treatment Limitations prior to consultation:  None        Exam:  Temperature: 36.7 C (98 F)  Heart Rate: 79  BP (Non-Invasive): (!) 128/96  Respiratory Rate: (!) 22  SpO2-1: 94 %  Pain Score (Numeric, Faces): 0   General: Pleasant, No apparent distress  Head:  Normocephalic and atraumatic  Eyes:  PERRLA, EOMI, sclera non-icteric  Mouth:  Mucous membranes moist  Neck:  Supple, trachea midline, no JVD  Lungs:  Bibasilar crackles  Heart:  RRR, no murmurs appreciated  Abdomen:  Soft, nontender, nondistended. BS normal  Extremities:  Trace lower extremity edema bilaterally  Skin:  Warm and dry  Neurologic:  A&O x3, no focal deficits     Palliative Performance Scale-  50      Labs:    Lab Results Today:    Results for orders placed or performed during the hospital encounter of 05/20/17 (from the past 24 hour(s))   POC BLOOD GLUCOSE (RESULTS)   Result Value Ref Range    GLUCOSE, POC 88 70 - 105 mg/dL   PT/INR   Result Value Ref Range    PROTHROMBIN TIME 18.8 (H) 9.3 - 13.9 seconds    INR 1.63 (H) 0.80 - 1.20   PTT (PARTIAL  THROMBOPLASTIN TIME)   Result Value Ref Range    APTT 34.2 25.1 - 36.5 seconds   B-TYPE NATRIURETIC PEPTIDE   Result Value Ref Range    BNP 2762 (H) <=100 pg/mL   HGA1C (HEMOGLOBIN A1C WITH EST AVG GLUCOSE)   Result Value Ref Range    HEMOGLOBIN A1C 7.5 (H) 4.0 - 6.0 %    ESTIMATED AVERAGE GLUCOSE 169 mg/dL   VENOUS BLOOD GAS/LACTATE   Result Value Ref Range    %FIO2 (VENOUS) 32.0 %    PH (VENOUS) 7.42 (H) 7.31 - 7.41    PCO2 (VENOUS) 35.00 (L) 41.00 - 51.00 mm/Hg    PO2 (VENOUS) 51.0 (H) 35.0 - 50.0 mm/Hg    BASE DEFICIT 1.3 -3.0 - 3.0 mmol/L    BICARBONATE (VENOUS) 23.6 22.0 - 26.0 mmol/L    LACTATE 2.0 (H) 0.0 - 1.3 mmol/L   TYPE AND SCREEN   Result Value Ref Range    UNITS ORDERED NOT STATED         ABO/RH(D) A POSITIVE     ANTIBODY SCREEN NEGATIVE     SPECIMEN EXPIRATION DATE 05/23/2017    THYROID STIMULATING HORMONE WITH FREE T4 REFLEX   Result Value Ref Range    TSH 1.938 0.350 - 5.000 uIU/mL   CBC WITH DIFF   Result Value Ref Range    WBC 8.6 3.5 - 11.0 x10^3/uL    RBC 4.80 4.06 - 5.63 x10^6/uL    HGB 14.3 12.5 - 16.3 g/dL    HCT 63.0 16.0 - 10.9 %    MCV 93.6 78.0 - 100.0 fL    MCH 29.7 27.4 - 33.0 pg    MCHC 31.7 (L) 32.5 - 35.8 g/dL    RDW 32.3 (H) 55.7 - 15.0 %    PLATELETS 190 140 - 450 x10^3/uL    MPV 8.9 7.5 - 11.5 fL    NEUTROPHIL % 72 %    LYMPHOCYTE % 15 %    MONOCYTE % 11 %    EOSINOPHIL % 2 %    BASOPHIL % 1 %    NEUTROPHIL # 6.18 1.50 - 7.70 x10^3/uL    LYMPHOCYTE # 1.27 1.00 - 4.80 x10^3/uL    MONOCYTE # 0.95 0.30 - 1.00 x10^3/uL    EOSINOPHIL # 0.16 0.00 - 0.50 x10^3/uL    BASOPHIL # 0.08 0.00 - 0.20 x10^3/uL   TRANSTHORACIC ECHOCARDIOGRAM - ADULT   Result Value Ref Range    LVIDD - 2D 5.0 cm    LVIDS 2D 4.7 cm    Interventricular Septum Diastolic Thickness by 2D 1.3 cm  LVPWD 1.0 cm    EF 13     TR VELOCITY 328     Biplane Simpson EF 27     AVA VTI 3     AVA Vmax 2     AV peak velocity post stress 112     AV mean gradient 3     Please see Linked Document for Final Report.       Normal left  ventricular size. Severely depressed left ventricular systolic function. LV Ejection Fraction is 27 %. Normal geometry. Abnormal diastolic function, elevated filling pressure.Marland Kitchen. Resting Segmental Wall Motion Analysis: Total wall motion score is 2.00. There is global left ventricular hypokinesis.. The right ventricle is not well visualized, at least mildly dysfunctional right ventricle. RV systolic pressure is consistent with moderate pulmonary hypertension. Elevated right atrial pressure.. There is mild secondary mitral regurgitation.. No aortic regurgitation seen. No Aortic Valve Stenosis.. There is mild tricuspid regurgitation.   URINALYSIS, MACROSCOPIC   Result Value Ref Range    SPECIFIC GRAVITY 1.019 1.005 - 1.030    GLUCOSE Negative Negative mg/dL    PROTEIN 102100  (A) Negative mg/dL    BILIRUBIN Negative Negative mg/dL    UROBILINOGEN Negative Negative mg/dL    PH 5.0 5.0 - 8.0    BLOOD Negative Negative mg/dL    KETONES Negative Negative mg/dL    NITRITE Negative Negative    LEUKOCYTES Negative Negative WBCs/uL    APPEARANCE Clear Clear    COLOR Normal (Yellow) Normal (Yellow)   URINALYSIS, MICROSCOPIC   Result Value Ref Range    WBCS 1.0 <4.0 /hpf    RBCS 1.0 <6.0 /hpf    BACTERIA Occasional or less Occasional or less /hpf    HYALINE CASTS 17.0 (H) <4.0 /lpf    MUCOUS Light Light /lpf   BASIC METABOLIC PANEL   Result Value Ref Range    SODIUM 141 136 - 145 mmol/L    POTASSIUM 4.0 3.5 - 5.1 mmol/L    CHLORIDE 108 96 - 111 mmol/L    CO2 TOTAL 20 (L) 22 - 32 mmol/L    ANION GAP 13 4 - 13 mmol/L    CALCIUM 9.7 8.5 - 10.2 mg/dL    GLUCOSE 72 65 - 725139 mg/dL    BUN 33 (H) 8 - 25 mg/dL    CREATININE 3.661.91 (H) 0.62 - 1.27 mg/dL    BUN/CREA RATIO 17 6 - 22    ESTIMATED GFR 34 (L) >59 mL/min/1.3573m^2   PHOSPHORUS   Result Value Ref Range    PHOSPHORUS 3.8 2.3 - 4.0 mg/dL   MAGNESIUM   Result Value Ref Range    MAGNESIUM 2.5 1.6 - 2.5 mg/dL   HEPATIC FUNCTION PANEL   Result Value Ref Range    ALBUMIN 3.4 3.4 - 4.8 g/dL     ALKALINE PHOSPHATASE 201 (H) <150 U/L    ALT (SGPT) 41 <55 U/L    AST (SGOT) 37 8 - 48 U/L    BILIRUBIN TOTAL 3.0 (H) 0.3 - 1.3 mg/dL    BILIRUBIN DIRECT 1.1 (H) <0.3 mg/dL    PROTEIN TOTAL 6.0 6.0 - 8.0 g/dL   LIPID PANEL   Result Value Ref Range    CHOLESTEROL 146 <200 mg/dL    HDL CHOL 37 (L) >=44>=39 mg/dL    TRIGLYCERIDES 87 <=034<=150 mg/dL    LDL CALC 92 <=742<=100 mg/dL    VLDL CALC 17 <59<30 mg/dL    NON-HDL 563109 <=875<=190 mg/dL    CHOL/HDL RATIO 3.9  TROPONIN-I   Result Value Ref Range    TROPONIN I 219 (H) 0 - 30 ng/L   POC BLOOD GLUCOSE (RESULTS)   Result Value Ref Range    GLUCOSE, POC 149 (H) 70 - 105 mg/dL   POC BLOOD GLUCOSE (RESULTS)   Result Value Ref Range    GLUCOSE, POC 115 (H) 70 - 105 mg/dL   Results for orders placed or performed during the hospital encounter of 05/19/17 (from the past 24 hour(s))   POC BLOOD GLUCOSE (RESULTS)   Result Value Ref Range    GLUCOSE, POC 169 (H) 70 - 110 mg/dL   BASIC METABOLIC PANEL   Result Value Ref Range    SODIUM 140 135 - 145 mmol/L    POTASSIUM 3.9 3.5 - 5.0 mmol/L    CHLORIDE 106 98 - 111 mmol/L    CO2 TOTAL 23 21 - 35 mmol/L    ANION GAP 11 mmol/L    CALCIUM 9.3 8.2 - 10.2 mg/dL    GLUCOSE 95 70 - 119 mg/dL    BUN 30 (H) 10 - 25 mg/dL    CREATININE 1.47 (H) <=1.30 mg/dL    BUN/CREA RATIO 16     ESTIMATED GFR 34 (L) Avg: 75 mL/min/1.21m^2   PHOSPHORUS   Result Value Ref Range    PHOSPHORUS 3.6 2.5 - 4.5 mg/dL   MAGNESIUM   Result Value Ref Range    MAGNESIUM 2.0 1.8 - 2.3 mg/dL   PT/INR   Result Value Ref Range    INR 1.66 (H) 0.90 - 1.10   CBC WITH DIFF   Result Value Ref Range    WBC 8.0 3.3 - 9.3 x10^3/uL    RBC 4.82 4.40 - 5.68 x10^6/uL    HGB 15.2 13.4 - 17.3 g/dL    HCT 82.9 56.2 - 13.0 %    MCV 93.7 82.4 - 95.0 fL    MCH 31.6 27.9 - 33.1 pg    MCHC 33.7 32.8 - 36.0 g/dL    RDW 86.5 (H) 78.4 - 15.1 %    PLATELETS 190 140 - 440 x10^3/uL    MPV 9.0 6.0 - 10.2 fL    NEUTROPHIL % 69 %    LYMPHOCYTE % 17 %    MONOCYTE % 11 %    EOSINOPHIL % 2 %    BASOPHIL % 1 %     NEUTROPHIL # 5.60 (H) 1.60 - 5.50 x10^3/uL    LYMPHOCYTE # 1.40 0.80 - 3.20 x10^3/uL    MONOCYTE # 0.90 (H) 0.20 - 0.80 x10^3/uL    EOSINOPHIL # 0.10 0.00 - 0.50 x10^3/uL    BASOPHIL # 0.10 0.00 - 0.20 x10^3/uL       Imaging Studies:  Images and Reports reviewed to current date.    Consultant:  Burlene Arnt, MD      Burlene Arnt, MD    Time of consult 80 minutes  Time of counseling as above with patient and sister 45 minutes      I saw and examined the patient.  I reviewed the resident's note.  I agree with the findings and plan of care as documented in the resident's note.  Any exceptions/additions are edited/noted.    Rosanne Gutting, MD

## 2017-05-20 NOTE — Nurses Notes (Signed)
Telemetry showed 20 beat run of v-tach with a rate in 150's. Patient resting in bed, asymptomatic. Spoke with tele Leonides Schanz) who believes that it was not v tach, but actually patient's underlying rhythm d/t the pacer not functioning at this time. She also stated that the pacer is now missing beats more frequently than it was before. Spoke with MD Charline Bills, he will come look at telemetry.   Will continue to monitor.

## 2017-05-20 NOTE — Care Plan (Signed)
[facility] being transferred to [facility] to room 9[facility] bed 18, report called and given to [facility]. BP (!) 127/92   Pulse 87   Temp 36.7 C (98.1 F)   Resp 13   Ht 1.829 m (6')   Wt 88.3 kg (194 lb 10.7 oz)   SpO2 97%   BMI 26.4 kg/m2  Called patients sister [name] unable to speak to her or leave a message at this time. Waiting for EMS now wait time 25 minutes.

## 2017-05-20 NOTE — Care Plan (Addendum)
[facility]  Medical Nutrition Therapy Screen Note                                      Date of Service: 05/20/2017    Reason for Note: Consult: diet education CHF    Reviewed patient status, diet order/TF/TPN, labs and medications.  Height: 182.9 cm (6')   Weight: 87.2 kg (192 lb 3.9 oz) (05/20/17 0900)   Body mass index is 26.07 kg/(m^2).    Brief Subjective:   Visited with pt who reports that his stomach has been upset today, but he received two medications for it - he feels upset stomach is related to other medications. He denies trouble chewing or swallowing. Pt endorses a UBW of 226 lbs about one year ago which decreased to 161 lbs and then increased again to 192 lbs. He typically eats cream of wheat in the mornings, with a sandwich or meatloaf or other foods for lunch/dinner (pt lives at [place]). Provided low sodium diet education, although many foods he consumes are prepared at facility where he lives. Did encourage pt to not use salt on foods, but only pepper. Also encouraged him to eat more vegetables. He does not have any questions at this time.      Most Recent Screen   Impaired Nutrition Status Score Impaired Nutrition Status Score: 1 - Wt. loss >5% in 3 months - Mild (05/20/17 1400)     Severity of Disease Score Severity of Disease Score: 1 - Chronic patients in particular with acute complications: cirrhosis, COPD, dialysis, oncology, recent bariatric surgery - Mild (05/20/17 1400)     Age Age: 76 - > 70 years (05/20/17 1400)   Score Score: 3 (05/20/17 1400)       Risk Level Risk Level: Level 3 - 3 pts - Screen note required. Follow-up within 5 days (05/20/17 1400)           Nutrition related problems: need for low sodium diet and diet instruction    Assessment: No assessment at this time    Monitor: Tolerance of diet    Plan/Intervention: Please order MNT protocol  Add supplement Ensure High Protein BID   Check weight daily  Will continue to monitor  per screening protocol    Lunette Stands, RDLD   [phone]        Problem: Patient Care Overview (Adult,OB)  Goal: Plan of Care Review(Adult,OB)  The patient and/or their representative will communicate an understanding of their plan of care   Outcome: Ongoing (see interventions/notes)      Problem: Nutrition, Imbalanced: Inadequate Oral Intake (Adult)  Goal: Identify Related Risk Factors and Signs and Symptoms  Related risk factors and signs and symptoms are identified upon initiation of Human Response Clinical Practice Guideline (CPG).  Outcome: Completed Date Met: 05/20/17    Goal: Improved Oral Intake  Patient will demonstrate the desired outcomes by discharge/transition of care.  Outcome: Ongoing (see interventions/notes)    Goal: Prevent Further Weight Loss  Patient will demonstrate the desired outcomes by discharge/transition of care.  Outcome: Ongoing (see interventions/notes)

## 2017-05-20 NOTE — Care Plan (Signed)
Problem: Patient Care Overview (Adult,OB)  Goal: Plan of Care Review(Adult,OB)  The patient and/or their representative will communicate an understanding of their plan of care   Outcome: Ongoing (see interventions/notes)  Pt will return to the [facility] at time of d/c.

## 2017-05-20 NOTE — Pharmacist Med Reconciliation (Signed)
Pharmacy Medication Reconciliation    Patient Name: [name],[name]  Date of Service: 05/20/2017  Date of Admission: 05/20/2017  Date of Birth:   Length of Stay:   0 days   General Risk Score (GRS): 5    Clarified Prior to Admission Medications:  Prior to Admission medications    Medication Sig Start Date End Date Taking Resumed Y/[name] Authorizing Provider Comments   aspirin (ECOTRIN) 81 mg Oral Tablet, Delayed Release (E.C.) Take 81 mg by mouth Once a day   Yes   Provider, Historical     carvedilol (COREG) 12.5 mg Oral Tablet Take 12.5 mg by mouth Twice daily with food   Yes   Provider, Historical     cholecalciferol, vitamin D3, 1,000 unit Oral Tablet Take 1,000 Units by mouth Once a day   Yes   Provider, Historical     digoxin (LANOXIN) 125 mcg Oral Tablet Take 0.125 mg by mouth Once a day   Yes   Provider, Historical     donepezil (ARICEPT) 10 mg Oral Tablet Take 10 mg by mouth Every night   Yes   Provider, Historical     Ferrous Sulfate 142 mg (45 mg iron) Oral Tablet Sustained Release Take by mouth   Yes   Provider, Historical     fluticasone propionate (FLUTICASONE NASL) by Nasal route   Yes   Provider, Historical     folic acid (FOLVITE) 1 mg Oral Tablet Take 1 mg by mouth Once a day   Yes   Provider, Historical     insulin glargine (LANTUS) 100 unit/mL Subcutaneous injection (vial) 15 Units by Subcutaneous route Every night   Yes   Provider, Historical     losartan (COZAAR) 25 mg Oral Tablet Take 100 mg by mouth Once a day    Yes   Provider, Historical     metOLazone (ZAROXOLYN) 5 mg Oral Tablet Take 1 Tab (5 mg total) by mouth Once a day 05/20/17  Yes   Rondell ReamsPeters, Marcus A, MD     nitroGLYCERIN (NITROSTAT) 0.4 mg Sublingual Tablet, Sublingual 0.4 mg by Sublingual route Every 5 minutes as needed for Chest pain for 3 doses over 15 minutes   Yes   Provider, Historical     omega-3-DHA-EPA-fish oil (FISH OIL) 1,000 mg (120 mg-180 mg) Oral Capsule Take by mouth Once a day   Yes   Provider, Historical      raNITIdine (ZANTAC) 75 mg Oral Tablet Take 75 mg by mouth Once a day   Yes   Provider, Historical         Information was collected from:  Caregiver   [facility] [phone] (Nursing Home)    Patient's understanding of medications:  Does not comprehend or understand their medications    Patient response to medication-related questions:  1. Do you have any issues affording your medications?  Not able to determine.  2. In about one week, how often would you say you miss a dose of your medications?  Not able to determine.  3. Would you like a pharmacist to come talk with you about your medications before you go home?  No    Summary:    Prior to Admission Medications Being Held and Rationale:  Carvedilol: Held in te setting of ICM EF 20-25% BiV-D acute on chronic systolic heart failure.    Other Medication Discrepancies from Home Medication List:  Added the following medications to patients prior to admission medication list:  Digoxin 125mcg  once daily  Fish Oil 1000U once daily  Carvedilol 12.5 BID  Ranitidine 75mg  once daily  Nitroglycerin 0.4 SL PRN    Pharmacist Recommendations:  1) Continue current medications in the setting of acute on chronic heart failure    [name] Lions, RX STUDENT

## 2017-05-20 NOTE — Care Plan (Signed)
Patient left with EMS to [facility]

## 2017-05-20 NOTE — Ancillary Notes (Signed)
Diabetes Education    Stopped to assess diabetes education needs. [name] reports that he lives at [facility] nursing home and they manage all his diabetes medications. The patient is not a candidate for diabetes education. Current A1c is   Lab Results   Component Value Date    HA1C 7.9 02/25/2017     Please place order for Diabetes Education if specific patient needs are identified prior to discharge.    Cathe Mons, RN, BSN  [facility]  Pager [phone]

## 2017-05-20 NOTE — Care Management Notes (Signed)
[facility] Management Initial Evaluation    Patient Name: [name]  Date of Birth:   Sex: male  Date/Time of Admission: 05/20/2017  6:17 AM  Room/Bed: 18/A  Payor: MEDICARE / Plan: MEDICARE PART A AND B / Product Type: Medicare /   PCP: Maida Sale, MD    Pharmacy Info:   Preferred Pharmacy     [facility] [place].    [address] [place] [address]    Phone: [phone] Fax: [phone]    Not a 24 hour pharmacy; exact hours not known        Emergency Contact Info:   Extended Emergency Contact Information  Primary Emergency Contact: ABISHAI, VIEGAS [place] Phone: [phone]  Relation: Sister  Secondary Emergency Contact: Latrelle Dodrill SUE  Address: [address], [place] [address] [place] Phone: [phone]  Relation: Significant other    History:   [name] is a 76 y.o., male, admitted stenosis of left anterior descending., male, admitted stenosis of left anterior descending.     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]    Assessment:      05/20/17 1222   Assessment Details   Assessment Type Admission   Date of Care Management Update 05/20/17   Date of Next DCP Update 05/22/17   Care Management Plan   Discharge Planning Status initial meeting   Projected Discharge Date 05/22/17   Patient choice offered to patient/family yes   Form for patient choice reviewed/signed and on chart yes   Facility or Agency Preferences [facility]   Discharge Needs Assessment   Equipment Currently Used at Home none   Equipment Needed After Discharge none   Discharge Facility/Level Of Care Needs SNF Return (Medicare certified)(code 3)   Transportation Available other (see comments)  (TBD- pt unsure at this time and would need to know d/c ahead of time to have family/friend come to get him. )    Referral Information   Admission Type inpatient   Address Verified verified-no changes   Arrived From acute hospital, other   [place] Verified verified-no change   ADVANCE DIRECTIVES   Does the Patient have an Advance Directive? Yes, Patient Does Have Advance Directive for Healthcare Treatment   Type of Advance Directive Completed Medical Power of Attorney;Medical Living Will   Copy of Advance Directives in Chart? 0   Name of [place]    Phone Number of [place] or Healthcare Surrogate [phone]   Patient Requests Assistance in Having Advance Directive Notarized. N/A   LAY CAREGIVER   Appointed Lay Caregiver? I Decline   Employment/Financial   Patient has Prescription Coverage?  Yes       Name of Insurance Coverage for Medications Unsure which one but knows he has coverage   Financial Concerns none   Living Environment   Select an age group to open "lives with" row.  Adult   Lives With facility resident   [place] to Return to Prior Arrangements yes   Home Safety   Home Assessment: No Problems Identified   Home Accessibility no  concerns     [name] is a 76 y.o., male, who transferred from [facility] and was admitted for stenosis of left anterior descending. male, who transferred from [facility] and was admitted for stenosis of left anterior descending.     Discharge Plan:  SNF Return (Medicare certified) (code 3)  MSW met with pt to discuss d/c planning. Pt is a resident at the [facility] and has been for the past 6 months. MSW called and spoke with Sharyn Lull in admissions who stated that the pt is a long term resident and will have no issues with returning. Pt could possibly have transportation if he has warning of when he will be d/c'd. Pt has MPOA/ Living Will on file. Pt confirmed insurance, PCP and has coverage for prescriptions but is unsure on which policy covers them.     The patient will continue to be evaluated for developing discharge needs.     Case Manager: Rulon Abide  Phone: [phone]

## 2017-05-20 NOTE — H&P (Addendum)
Cardiology [facility] Service  Admission History & Physical       [name], [name], 76 y.o. male  Date of Admission:  05/20/2017  Date of service: 05/20/2017  Date of Birth:      Information obtained from: Patient and EMR  Chief Complaint: Decompensated heart failure    History of Present Illness: [name] is a 76 y.o. 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 Patient initially presented to [facility] for a diagnostic right and left heart cath. There he was found to have occlusive disease in the LAD and LCA and a cardiac index of 1. The patient was diuresed with IV lasix and metolazone and was then transferred to [facility] for further evaluation. On arrival, patient was saturating 99% on 3L and denied any chest pain or SOB. CXR showed evidence of pulmonary edema. Patient denies any nausea, abdominal pain or lightheadedness. Patient reports that he uses "a little" oxygen at home but is unable to quantify how much. Patient states that he is primarily bound to a wheelchair but can walk very short distances until he feels short of breath. Denies any recent episodes of chest pain, only endorsing occasional dyspnea.    Past Medical History:   Past Medical History:   Diagnosis Date   . Automatic implantable cardioverter-defibrillator in situ    . BPH (benign prostatic hypertrophy)    . CAD (coronary artery disease) 09/25/2011   . Cataract    . CHF (NYHA class II, ACC/AHA stage C) (CMS HCC) 10/24/2013   . CVA (cerebrovascular accident) (CMS HCC)    . Dementia    . Diabetes mellitus (CMS HCC)    . Diabetes mellitus, type 2 (CMS HCC)    . Disorder of prostate    . Esophageal reflux    . HTN (hypertension)    . MI (myocardial infarction) (CMS HCC)    . Pacemaker    . Renal stones    . Stented coronary artery    . Type 2 diabetes mellitus (CMS HCC)    . Wears dentures    . Wears glasses           Past  Surgical History:   Past Surgical History:   Procedure Laterality Date   . CORONARY ARTERY ANGIOPLASTY     . HX ADENOIDECTOMY     . HX APPENDECTOMY     . HX CARDIAC ABLATION     . HX HEART CATHETERIZATION     . HX LITHOTRIPSY     . HX PACEMAKER DEFIBRILLATOR PLACEMENT     . HX TONSILLECTOMY            Medications Prior to Admission:   Medications Prior to Admission     Prescriptions    acetylcysteine (MUCOMYST) 20% (200MG /Ml) Oral Solution    Take 3 mL (600 mg total) by mouth Every 12 hours for 3 doses    apixaban (ELIQUIS) 5 mg Oral Tablet    Take 5 mg by mouth Twice daily    aspirin (ECOTRIN) 81 mg Oral Tablet, Delayed Release (E.C.)    Take 81 mg by mouth Once a day    cholecalciferol, vitamin D3, 1,000 unit Oral Tablet    Take 1,000 Units by mouth Once a day    Coenzyme Q10 (CO Q-10) 100 mg Oral Capsule    Take by mouth Once a day  donepezil (ARICEPT) 10 mg Oral Tablet    Take 10 mg by mouth Every night    Ferrous Sulfate 142 mg (45 mg iron) Oral Tablet Sustained Release    Take by mouth    fluticasone propionate (FLUTICASONE NASL)    by Nasal route    folic acid (FOLVITE) 1 mg Oral Tablet    Take 1 mg by mouth Once a day    furosemide (LASIX) 10 mg/mL Injection Solution    4 mL (40 mg total) by Intravenous route Twice daily    insulin glargine (LANTUS) 100 unit/mL Subcutaneous injection (vial)    15 Units by Subcutaneous route Every night    insulin lispro (HUMALOG) 100 unit/mL Subcutaneous Solution    1-6 Units by Subcutaneous route Four times a day - before meals and bedtime    losartan (COZAAR) 25 mg Oral Tablet    Take 100 mg by mouth Once a day     metOLazone (ZAROXOLYN) 5 mg Oral Tablet    Take 1 Tab (5 mg total) by mouth Once a day    alum-mag hydroxide-simethicone (MAALOX MAX) 400-400-40 mg/5 mL Oral Suspension    Take 15 mL by mouth Four times a day as needed    ammonium lactate (GERI-HYDROLAC) 12 % Cream    ammonium lactate (LAC-HYDRIN) 12 % Lotion    Apply topically Twice daily rub in to affected  area well    bacitracin zinc (ANTIBIOTIC, BACITRACIN ZINC,) 500 unit/gram Ointment    by Apply Topically route Twice per day as needed    bisacodyl (DULCOLAX, BISACODYL,) 5 mg Oral Tablet, Delayed Release (E.C.)    Take 5 mg by mouth Every 24 hours as needed for Constipation    carvedilol (COREG) 6.25 mg Oral Tablet    Take 1 Tab (6.25 mg total) by mouth Twice daily with food    Patient taking differently:  Take 12.5 mg by mouth Twice daily with food     digoxin (LANOXIN) 125 mcg Oral Tablet    Take 0.125 mg by mouth Once a day    furosemide (LASIX) 40 mg Oral Tablet    Take 40 mg by mouth Twice daily     metFORMIN (GLUCOPHAGE) 500 mg Oral Tablet    Take 500 mg by mouth Twice daily with food     multivitamin Oral Tablet    Take 1 Tab by mouth Once a day    nitroglycerin (NITROSTAT) 0.4 mg Sublingual Tablet, Sublingual    1 Tab (0.4 mg total) by Sublingual route Every 5 minutes as needed for Chest pain. for 3 doses over 15 minutes    Omega-3 Fatty Acids-Vitamin E (FISH OIL) 1,000 mg Oral Capsule    Take 1,000 mg by mouth Once a day    ranitidine (ZANTAC) 150 mg Oral Tablet    Take 150 mg by mouth Once a day          Allergies:   He is allergic to lipitor [atorvastatin] and pravastatin.   Family History:   His family history includes Cancer in his sister; Coronary Artery Disease in an other family member; Diabetes in his mother and sister; High Cholesterol in his father; Hypertension in his father.   Social History:  He  reports that he does not drink alcohol.  He  reports that he has never smoked. He has never used smokeless tobacco.  He  reports that he does not use illicit drugs.    Review of Systems  Constitutional: No fever, chills,  fatigue, or unintentional weight loss/gain  HEENT: No congestion, rhinorrhea, or sore throat  CV: No chest pain, palpitations, or edema  Respiratory: No shortness of breath, cough, or wheezing  GI: No N/V/D or abdominal pain  GU: No dysuria or hematuria  Musculoskeletal: No myalgias  or arthralgias  Skin: No rash or dryness  Psych: No depression or anxiety  Neuro: No weakness, paresthesias, or seizures  Endocrine: No polyuria, polydipsia, or temperature intolerance  Heme: No history of clots; no easy bleeding/bruising  Allergy: No history of anaphylaxis    Today's Physical Exam:   Filed Vitals:    05/20/17 0629 05/20/17 0752   BP: 136/90 106/89   Pulse: 77 77   Resp:  (!) 24   Temp:  36.5 C (97.7 F)   SpO2:  99%     General: Pleasant, No apparent distress  Head:  Normocephalic and atraumatic  Eyes:  PERRLA, EOMI, sclera non-icteric   Mouth:  Mucous membranes moist  Neck:  Supple, trachea midline, no JVD  Lungs:  Bibasilar crackles  Heart:  RRR, no murmurs appreciated  Abdomen:  Soft, nontender, nondistended. BS normal  Extremities:  Trace lower extremity edema bilaterally  Skin:  Warm and dry  Neurologic:  A&O x3, no focal deficits     Labs:  BMP   Recent Labs      05/19/17   1445  05/20/17   0336   SODIUM  142  140   POTASSIUM  4.5  3.9   CHLORIDE  106  106   CO2  25  23   BUN  33*  30*   CREATININE  1.97*  1.92*   GLUCOSENF  126*  95   ANIONGAP  11  11   BUNCRRATIO  17  16   GFR  33*  34*        CBC   Recent Labs      05/19/17   1445  05/20/17   0336  05/20/17   0750   WBC  6.6  8.0  8.6   HGB  14.1  15.2  14.3   HCT  42.6  45.2  44.9   PLTCNT  156  190  190      Diff   Recent Labs      05/19/17   1445  05/20/17   0336  05/20/17   0750   PMNS  73  69  72   LYMPHOCYTES  15  17  15    MONOCYTES  9  11  11    EOSINOPHIL  2  2  2    BASOPHILS  1  0.00  1  0.10  1  0.08   PMNABS  4.80  5.60*  6.18   LYMPHSABS  1.00  1.40  1.27   EOSABS  0.10  0.10  0.16   MONOSABS  0.60  0.90*  0.95        Cardiac Labs   Recent Labs      05/19/17   1445  05/20/17   0750   BNP  1420*  2762*        Thyroid Studies   Recent Labs      05/20/17   0750   TSH  1.938        LFTs   No results for input(s): AST, ALT, ALKPHOS, TOTBILIRUBIN, BILIRUBINCON, TOTALPROTEIN, ALBUMIN in the last 72 hours.  Recent Labs      05/20/17    0921   UROBILINOGEN  Negative  Blood Gas Studies   Recent Labs      05/19/17   1015  05/19/17   1016  05/20/17   0750   FI02  32  32.0  32.0   PH  7.42  7.40  7.42*   PCO2  38.0  43.00  35.00*   PO2  96.0  26.0*  26.0  51.0*   BICARBONATE  25.1*  24.6  23.6   BASEEXCESS  0.3   --    --    BASEDEFICIT   --    --   1.3          Imaging:  I have reviewed all imaging studies.  Pertinent results are as below:   CXR with pulmonary edema    Cardiac imaging and hemodynamics:   Echocardiogram - 05/20/17  Normal left ventricular size. Severely depressed left ventricular systolic function. LV Ejection Fraction is 27 %. Normal geometry. Abnormal diastolic function, elevated filling pressure.Marland Kitchen Resting Segmental Wall Motion Analysis: Total wall motion score is 2.00. There is global left ventricular hypokinesis.. The right ventricle is not well visualized, at least mildly dysfunctional right ventricle. RV systolic pressure is consistent with moderate pulmonary hypertension. Elevated right atrial pressure.. There is mild secondary mitral regurgitation.. No aortic regurgitation seen. No Aortic Valve Stenosis.. There is mild tricuspid regurgitation.      Assessment and Plan:  [name] is a 76 y.o. 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 BiV-D, NYHA Class IV  CAD s/p PCI  Acute on chronic systolic Heart Failure   -Pt with multiple vessel disease on catheterizations however is an unsuitable candidate for high-risk intervention given his comorbidities and functional  -TTE on admission 05/20/17 shows LVEF 27%, abnormal diastolic dysfunction w/ elevated filling pressures, moderate pulm HTN  -Plan for medical management with aggressive diuresis  -Start with IV Lasix 80 mg BID and assess response; repeat BMP in PM during aggressive diuresis; Patient is home lasix 40 PO BID at home  -Strict  I and O's  -Continue ASA 81 mg   -Hold home losartan 100 mg QD, coreg 12.5 BID  -Discussed plan of care with patient's sister and will consult Palliative care for further discussion of goal clarification and hospice     Atrial Fibrillation  -CHADSVASC 6, HAS BLED 5  -S/p BiV-D in 2015, AVN in 2017  -Continue home digoxin 125 mcg QD  -Anticoagulation with home Eliquis 5 mg QD    Dementia  -Fully oriented on exam  -Continue Aricept 10 mg QHS    IDDM2  -Home Lantus is 15 U QHS   -Restart conservatively at 7 U QHS  -conservative SSI  -Hold home metformin    Chronic Medical Conditions:  -Vitamin D def: continue 1000 U D3 QD  -Fe Deficiency Anemia: continue Fe replacement  -GERD: pepcid while inpatient       Coronary artery disease therapies:  - ASA: 81 mg daily  - P2Y12 inhibitor (Plavix, Brilinta, Effient): N/A  - Statin: allergic to statin  - Specific BB/ACE-i as below    Heart failure therapies:  - ACE-i/ARB: Yes  - Beta blocker: Yes  - Aldosterone antagonist: No: Why? n/a  - ICD: Yes  - CRT: Yes    Prophylaxis:   DVT/PE:  Apixaban   GI: H2 blocker  Diet and Nutrition: Cardiac diet  PT/OT:  Ordered  Code status: Full Code    Disposition: Nursing facility vs hospice    Sardar Marilynn LatinoMomin Shah-Khan, MD            I saw and examined the patient.  I reviewed the resident's note.  I agree with the findings and plan of care as documented in the resident's note.  Any exceptions/additions are edited/noted.    John GiovanniMarco Orin Eberwein, DO    Addendum:  After family meeting with Supportive Care, patient is DNR but is OK with intubation.    Sardar Marilynn LatinoMomin Shah-Khan, MD     Agree  John GiovanniMarco Bobbette Eakes, DO

## 2017-05-20 NOTE — Care Plan (Signed)
Problem: Patient Care Overview (Adult,OB)  Goal: Plan of Care Review(Adult,OB)  The patient and/or their representative will communicate an understanding of their plan of care   Outcome: Ongoing (see interventions/notes)  No complaints of chest pain this shift. Shortness of breath reported at times, but relieved with rest and O2, Fall precautions and sitter select maintained and patient remains fall free thus far. Video monitoring in place d/t patient being impulsive and not calling out. Supportive care consulted and patient made the decision to be NO CPR code status. Plan is to diuresis, medically manage and d/c patient back to nursing home. Plan of care reviewed with patient and sibling at bedside.    Goal: Individualization/Patient Specific Goal(Adult/OB)  Outcome: Ongoing (see interventions/notes)      Problem: Fall Risk (Adult)  Goal: Identify Related Risk Factors and Signs and Symptoms  Related risk factors and signs and symptoms are identified upon initiation of Human Response Clinical Practice Guideline (CPG).   Outcome: Completed Date Met: 05/20/17    Goal: Absence of Falls  Patient will demonstrate the desired outcomes by discharge/transition of care.   Outcome: Ongoing (see interventions/notes)  Sitter select in place and audible. Call bell within reach; encouraged to call for assistance       Problem: Skin Injury Risk (Adult,Obstetrics,Pediatric)  Goal: Identify Related Risk Factors and Signs and Symptoms  Related risk factors and signs and symptoms are identified upon initiation of Human Response Clinical Practice Guideline (CPG).   Outcome: Completed Date Met: 05/20/17    Goal: Skin Health and Integrity  Patient will demonstrate the desired outcomes by discharge/transition of care.   Outcome: Ongoing (see interventions/notes)  Frequent weight shift and ambulation encouraged. Patient in chair for meals. Protective mepalex in place.

## 2017-05-20 NOTE — Nurses Notes (Signed)
Patient in chair with sitter select in place. Sitter select alarm sounded and RN went directly to room, patient was already in the bathroom and had removed his pulse ox and telemetry. Video Monitoring contacted to be put in place. Re-educated patient on importance of calling out to get assistance with ambulation. Will continue to monitor.

## 2017-05-20 NOTE — Consults (Signed)
[facility]  Supportive/Palliative Care Medicine Consult  Follow Up Note    [name], [name], 76 y.o. male  Date of Service: 05/20/2017  Date of Birth:      Hospital Day:  LOS: 0 days     Assessment/Recommendations:  Present for meeting with patient, sister/MPOA-Jean, Dr.Moss, Dr.Carvajal and Dr.Higa.  During this meeting, DNR card reviewed and completed with patient. DNR faxed to [facility], copy given to sister, and card placed on chart.  Notified Care Manager-L.Vear Clock, MSW.    [name], MSW

## 2017-05-21 DIAGNOSIS — I2119 ST elevation (STEMI) myocardial infarction involving other coronary artery of inferior wall: Secondary | ICD-10-CM

## 2017-05-21 DIAGNOSIS — I4510 Unspecified right bundle-branch block: Secondary | ICD-10-CM

## 2017-05-21 DIAGNOSIS — I2109 ST elevation (STEMI) myocardial infarction involving other coronary artery of anterior wall: Secondary | ICD-10-CM

## 2017-05-21 LAB — BASIC METABOLIC PANEL
ANION GAP: 12 mmol/L (ref 4–13)
ANION GAP: 11 mmol/L (ref 4–13)
BUN/CREA RATIO: 18 (ref 6–22)
BUN/CREA RATIO: 17 (ref 6–22)
BUN: 34 mg/dL — ABNORMAL HIGH (ref 8–25)
BUN: 33 mg/dL — ABNORMAL HIGH (ref 8–25)
CALCIUM: 9.3 mg/dL (ref 8.5–10.2)
CALCIUM: 9.2 mg/dL (ref 8.5–10.2)
CHLORIDE: 104 mmol/L (ref 96–111)
CHLORIDE: 105 mmol/L (ref 96–111)
CO2 TOTAL: 22 mmol/L (ref 22–32)
CO2 TOTAL: 24 mmol/L (ref 22–32)
CREATININE: 1.94 mg/dL — ABNORMAL HIGH (ref 0.62–1.27)
CREATININE: 1.99 mg/dL — ABNORMAL HIGH (ref 0.62–1.27)
ESTIMATED GFR: 34 mL/min/{1.73_m2} — ABNORMAL LOW (ref >59)
ESTIMATED GFR: 33 mL/min/{1.73_m2} — ABNORMAL LOW (ref >59)
ESTIMATED GFR: 33 mL/min/{1.73_m2} — ABNORMAL LOW (ref >59)
GLUCOSE: 181 mg/dL — ABNORMAL HIGH (ref 65–139)
GLUCOSE: 112 mg/dL (ref 65–139)
POTASSIUM: 3.6 mmol/L (ref 3.5–5.1)
POTASSIUM: 3.9 mmol/L (ref 3.5–5.1)
SODIUM: 138 mmol/L (ref 136–145)
SODIUM: 140 mmol/L (ref 136–145)

## 2017-05-21 LAB — TROPONIN-I
TROPONIN I: 265 ng/L — ABNORMAL HIGH (ref 0–30)
TROPONIN I: 241 ng/L — ABNORMAL HIGH (ref 0–30)

## 2017-05-21 LAB — CBC WITH DIFF
BASOPHIL #: 0.05 10*3/uL (ref 0.00–0.20)
BASOPHIL %: 1 %
EOSINOPHIL #: 0.25 10*3/uL (ref 0.00–0.50)
EOSINOPHIL #: 0.25 10*3/uL (ref 0.00–0.50)
EOSINOPHIL %: 3 %
HCT: 43.7 % (ref 36.7–47.0)
HGB: 14.5 g/dL (ref 12.5–16.3)
LYMPHOCYTE #: 0.97 10*3/uL — ABNORMAL LOW (ref 1.00–4.80)
LYMPHOCYTE %: 12 %
MCH: 30.5 pg (ref 27.4–33.0)
MCHC: 33.2 g/dL (ref 32.5–35.8)
MCV: 91.8 fL (ref 78.0–100.0)
MONOCYTE #: 0.81 10*3/uL (ref 0.30–1.00)
MONOCYTE %: 10 %
MPV: 8.9 fL (ref 7.5–11.5)
NEUTROPHIL #: 5.83 10*3/uL (ref 1.50–7.70)
NEUTROPHIL %: 74 %
PLATELETS: 176 10*3/uL (ref 140–450)
RBC: 4.76 10*6/uL (ref 4.06–5.63)
RDW: 15.6 % — ABNORMAL HIGH (ref 12.0–15.0)
WBC: 7.9 10*3/uL (ref 3.5–11.0)

## 2017-05-21 LAB — ECG 12-LEAD
Atrial Rate: 78 {beats}/min
Calculated R Axis: -145 degrees
Calculated T Axis: 39 degrees
Calculated T Axis: 39 degrees
QT Interval: 410 ms
QTC Calculation: 439 ms

## 2017-05-21 LAB — PHOSPHORUS: PHOSPHORUS: 4 mg/dL (ref 2.3–4.0)

## 2017-05-21 LAB — POC BLOOD GLUCOSE (RESULTS)
GLUCOSE, POC: 136 mg/dL — ABNORMAL HIGH (ref 70–105)
GLUCOSE, POC: 200 mg/dL — ABNORMAL HIGH (ref 70–105)
GLUCOSE, POC: 119 mg/dL — ABNORMAL HIGH (ref 70–105)
GLUCOSE, POC: 193 mg/dL — ABNORMAL HIGH (ref 70–105)

## 2017-05-21 LAB — MAGNESIUM: MAGNESIUM: 2.2 mg/dL (ref 1.6–2.5)

## 2017-05-21 MED ORDER — INSULIN GLARGINE (U-100) 100 UNIT/ML SUBCUTANEOUS SOLUTION
12.00 [IU] | Freq: Every evening | SUBCUTANEOUS | Status: DC
Start: 2017-05-21 — End: 2017-05-23
  Administered 2017-05-21 – 2017-05-22 (×2): 12 [IU] via SUBCUTANEOUS
  Filled 2017-05-21 (×2): qty 12

## 2017-05-21 MED ORDER — FUROSEMIDE 10 MG/ML INJECTION SOLUTION
80.0000 mg | Freq: Three times a day (TID) | INTRAMUSCULAR | Status: DC
Start: 2017-05-21 — End: 2017-05-24
  Administered 2017-05-21 – 2017-05-24 (×10): 80 mg via INTRAVENOUS
  Filled 2017-05-21 (×10): qty 8

## 2017-05-21 MED ORDER — INSULIN GLARGINE (U-100) 100 UNIT/ML SUBCUTANEOUS SOLUTION
10.00 [IU] | Freq: Every evening | SUBCUTANEOUS | Status: DC
Start: 2017-05-21 — End: 2017-05-21

## 2017-05-21 MED ORDER — POTASSIUM CHLORIDE ER 20 MEQ TABLET,EXTENDED RELEASE(PART/CRYST)
40.0000 meq | ORAL_TABLET | ORAL | Status: AC
Start: 2017-05-21 — End: 2017-05-21
  Administered 2017-05-21: 40 meq via ORAL
  Filled 2017-05-21: qty 2

## 2017-05-21 MED ORDER — ISOSORBIDE DINITRATE 10 MG TABLET
10.00 mg | ORAL_TABLET | Freq: Three times a day (TID) | ORAL | Status: DC
Start: 2017-05-21 — End: 2017-05-23
  Administered 2017-05-21 – 2017-05-23 (×6): 10 mg via ORAL
  Filled 2017-05-21 (×9): qty 1

## 2017-05-21 MED ORDER — HYDRALAZINE 10 MG TABLET
10.00 mg | ORAL_TABLET | Freq: Three times a day (TID) | ORAL | Status: DC
Start: 2017-05-21 — End: 2017-05-23
  Administered 2017-05-21 – 2017-05-23 (×7): 10 mg via ORAL
  Filled 2017-05-21 (×10): qty 1

## 2017-05-21 MED ADMIN — hydrALAZINE 10 mg tablet: ORAL | @ 10:00:00

## 2017-05-21 MED ADMIN — digoxin 125 mcg (0.125 mg) tablet: ORAL | @ 09:00:00

## 2017-05-21 NOTE — Care Plan (Signed)
Problem: Patient Care Overview (Adult,OB)  Goal: Plan of Care Review(Adult,OB)  The patient and/or their representative will communicate an understanding of their plan of care   Outcome: Ongoing (see interventions/notes)  VSS. No complaints of pain. Sitter select and video monitoring remain in place. Continuing to diuresis and fluid restriction of 1800 started.

## 2017-05-21 NOTE — Progress Notes (Addendum)
[facility]  Medicine Progress Note    Jim RichesWilliam N [name]  Date of service: 05/21/2017  Date of Admission:  05/20/2017    Hospital Day:  LOS: 1 day   Subjective: Patient with 8 and 20 beat runs of VTach overnight. Patient was asymptomatic. This AM patient denies any chest pain or SOB. States that he feels near baseline. Denies any lightheadedness or nausea.    Vital Signs:  Temp  Avg: 36.6 C (97.8 F)  Min: 36.4 C (97.6 F)  Max: 36.7 C (98 F)    Pulse  Avg: 81.5  Min: 76  Max: 90 BP  Min: 106/89  Max: 136/78   Resp  Avg: 20.7  Min: 18  Max: 24 SpO2  Avg: 95.9 %  Min: 93 %  Max: 99 %   Pain Score (Numeric, Faces): 0      Input/Output    Intake/Output Summary (Last 24 hours) at 05/21/17 0719  Last data filed at 05/21/17 0500   Gross per 24 hour   Intake             1260 ml   Output             2025 ml   Net             -765 ml    I/O last shift:  11/08 0000 - 11/08 0759  In: 0   Out: 400 [Urine:400]     Current Facility-Administered Medications:  apixaban (ELIQUIS) tablet 5 mg Oral 2x/day   aspirin chewable tablet 81 mg 81 mg Oral Daily   cholecalciferol (VITAMIN D3) tablet 1,000 Units Oral Daily   digoxin (LANOXIN) tablet 125 mcg Oral Daily   donepezil (ARICEPT) tablet 5 mg Oral NIGHTLY   famotidine (PEPCID) tablet 20 mg Oral Daily   ferrous sulfate 324 mg (65 mg elemental IRON) tablet 324 mg Oral Daily before Breakfast   folic acid (FOLVITE) tablet 1 mg Oral Daily   furosemide (LASIX) 10 mg/mL injection 80 mg Intravenous Q8HRS   insulin glargine (LANTUS) 100 units/mL injection 5 Units Subcutaneous NIGHTLY   SSIP insulin lispro (HUMALOG) 100 units/mL injection 3-9 Units Subcutaneous 4x/day PRN       Physical Exam:  General: Appears stated age, in chronically poor health and in no acute distress.  Eyes: Conjunctiva clear, pupils equal and round.  HENT: ENT without erythema or injection, mouth mucous membranes moist.  Neck: Supple, symmetrical, trachea midline.  Respiratory: Lungs clear to auscultation  bilaterally  Cardiovascular: Regular rate and rhythm, no extra heart sounds appreciated  Abdomen: Soft, non-tender, non-distended, bowel sounds normal.  Musculoskeletals: 2+ pitting edema in BL lower extremities  Skin: Skin warm and dry  Neurologic: Grossly normal, Alert and oriented x3.  Psychiatric: Normal affect.      Labs:  Results for orders placed or performed during the hospital encounter of 05/20/17 (from the past 24 hour(s))   BASIC METABOLIC PANEL   Result Value Ref Range    SODIUM 141 136 - 145 mmol/L    POTASSIUM 4.0 3.5 - 5.1 mmol/L    CHLORIDE 108 96 - 111 mmol/L    CO2 TOTAL 20 (L) 22 - 32 mmol/L    ANION GAP 13 4 - 13 mmol/L    CALCIUM 9.7 8.5 - 10.2 mg/dL    GLUCOSE 72 65 - 387139 mg/dL    BUN 33 (H) 8 - 25 mg/dL    CREATININE 5.641.91 (H) 0.62 - 1.27 mg/dL    BUN/CREA  RATIO 17 6 - 22    ESTIMATED GFR 34 (L) >59 mL/min/1.47m2    Narrative    Hemolysis can alter results at this level (slight).   PHOSPHORUS   Result Value Ref Range    PHOSPHORUS 3.8 2.3 - 4.0 mg/dL    Narrative    Hemolysis can alter results at this level (slight).   MAGNESIUM   Result Value Ref Range    MAGNESIUM 2.5 1.6 - 2.5 mg/dL    Narrative    Hemolysis can alter results at this level (slight).   B-TYPE NATRIURETIC PEPTIDE   Result Value Ref Range    BNP 2762 (H) <=100 pg/mL   CBC/DIFF    Narrative    The following orders were created for panel order CBC/DIFF.  Procedure                               Abnormality         Status                     ---------                               -----------         ------                     CBC WITH DIFF[[id number]]                Abnormal            Final result                 Please view results for these tests on the individual orders.   HEPATIC FUNCTION PANEL   Result Value Ref Range    ALBUMIN 3.4 3.4 - 4.8 g/dL    ALKALINE PHOSPHATASE 201 (H) <150 U/L    ALT (SGPT) 41 <55 U/L    AST (SGOT) 37 8 - 48 U/L    BILIRUBIN TOTAL 3.0 (H) 0.3 - 1.3 mg/dL    BILIRUBIN DIRECT 1.1 (H) <0.3 mg/dL     PROTEIN TOTAL 6.0 6.0 - 8.0 g/dL    Narrative    Hemolysis can alter results at this level (slight).   HGA1C (HEMOGLOBIN A1C WITH EST AVG GLUCOSE)   Result Value Ref Range    HEMOGLOBIN A1C 7.5 (H) 4.0 - 6.0 %    ESTIMATED AVERAGE GLUCOSE 169 mg/dL    Narrative    ADA 1610 recommendations suggest lowering A1C to below or around 7% to reduce microvascular and neuropathic complications of type 1 and type 2 diabetes.  There are no age/gender specific ranges for eAG.   LIPID PANEL   Result Value Ref Range    CHOLESTEROL 146 <200 mg/dL    HDL CHOL 37 (L) >=96 mg/dL    TRIGLYCERIDES 87 <=045 mg/dL    LDL CALC 92 <=409 mg/dL    VLDL CALC 17 <81 mg/dL    NON-HDL 191 <=478 mg/dL    CHOL/HDL RATIO 3.9    PT/INR   Result Value Ref Range    PROTHROMBIN TIME 18.8 (H) 9.3 - 13.9 seconds    INR 1.63 (H) 0.80 - 1.20    Narrative    Coumadin therapy INR range for Conventional Anticoagulation is 2.0 to 3.0 and for Intensive Anticoagulation 2.5 to 3.5.  URINALYSIS, MACROSCOPIC AND MICROSCOPIC W/CULTURE REFLEX    Narrative    The following orders were created for panel order URINALYSIS, MACROSCOPIC AND MICROSCOPIC W/CULTURE REFLEX.  Procedure                               Abnormality         Status                     ---------                               -----------         ------                     URINALYSIS, MACROSCOPIC[[id number]]      Abnormal            Final result               URINALYSIS, MICROSCOPIC[[id number]]      Abnormal            Final result                 Please view results for these tests on the individual orders.   VENOUS BLOOD GAS/LACTATE   Result Value Ref Range    %FIO2 (VENOUS) 32.0 %    PH (VENOUS) 7.42 (H) 7.31 - 7.41    PCO2 (VENOUS) 35.00 (L) 41.00 - 51.00 mm/Hg    PO2 (VENOUS) 51.0 (H) 35.0 - 50.0 mm/Hg    BASE DEFICIT 1.3 -3.0 - 3.0 mmol/L    BICARBONATE (VENOUS) 23.6 22.0 - 26.0 mmol/L    LACTATE 2.0 (H) 0.0 - 1.3 mmol/L   THYROID STIMULATING HORMONE WITH FREE T4 REFLEX   Result Value Ref Range     TSH 1.938 0.350 - 5.000 uIU/mL   TROPONIN-I   Result Value Ref Range    TROPONIN I 219 (H) 0 - 30 ng/L   PTT (PARTIAL THROMBOPLASTIN TIME)   Result Value Ref Range    APTT 34.2 25.1 - 36.5 seconds    Narrative    Therapeutic range for unfractionated heparin is 60.0-100.0 seconds.   CBC WITH DIFF   Result Value Ref Range    WBC 8.6 3.5 - 11.0 x103/uL    RBC 4.80 4.06 - 5.63 x106/uL    HGB 14.3 12.5 - 16.3 g/dL    HCT 54.0 98.1 - 19.1 %    MCV 93.6 78.0 - 100.0 fL    MCH 29.7 27.4 - 33.0 pg    MCHC 31.7 (L) 32.5 - 35.8 g/dL    RDW 47.8 (H) 29.5 - 15.0 %    PLATELETS 190 140 - 450 x103/uL    MPV 8.9 7.5 - 11.5 fL    NEUTROPHIL % 72 %    LYMPHOCYTE % 15 %    MONOCYTE % 11 %    EOSINOPHIL % 2 %    BASOPHIL % 1 %    NEUTROPHIL # 6.18 1.50 - 7.70 x103/uL    LYMPHOCYTE # 1.27 1.00 - 4.80 x103/uL    MONOCYTE # 0.95 0.30 - 1.00 x103/uL    EOSINOPHIL # 0.16 0.00 - 0.50 x103/uL    BASOPHIL # 0.08 0.00 - 0.20 x103/uL   URINALYSIS, MACROSCOPIC   Result Value Ref Range    SPECIFIC GRAVITY 1.019 1.005 - 1.030  GLUCOSE Negative Negative mg/dL    PROTEIN 025  (A) Negative mg/dL    BILIRUBIN Negative Negative mg/dL    UROBILINOGEN Negative Negative mg/dL    PH 5.0 5.0 - 8.0    BLOOD Negative Negative mg/dL    KETONES Negative Negative mg/dL    NITRITE Negative Negative    LEUKOCYTES Negative Negative WBCs/uL    APPEARANCE Clear Clear    COLOR Normal (Yellow) Normal (Yellow)   URINALYSIS, MICROSCOPIC   Result Value Ref Range    WBCS 1.0 <4.0 /hpf    RBCS 1.0 <6.0 /hpf    BACTERIA Occasional or less Occasional or less /hpf    HYALINE CASTS 17.0 (H) <4.0 /lpf    MUCOUS Light Light /lpf   BASIC METABOLIC PANEL   Result Value Ref Range    SODIUM 138 136 - 145 mmol/L    POTASSIUM 3.6 3.5 - 5.1 mmol/L    CHLORIDE 104 96 - 111 mmol/L    CO2 TOTAL 22 22 - 32 mmol/L    ANION GAP 12 4 - 13 mmol/L    CALCIUM 9.3 8.5 - 10.2 mg/dL    GLUCOSE 427 (H) 65 - 139 mg/dL    BUN 34 (H) 8 - 25 mg/dL    CREATININE 0.62 (H) 0.62 - 1.27 mg/dL     BUN/CREA RATIO 18 6 - 22    ESTIMATED GFR 34 (L) >59 mL/min/1.61m2   CBC/DIFF    Narrative    The following orders were created for panel order CBC/DIFF.  Procedure                               Abnormality         Status                     ---------                               -----------         ------                     CBC WITH BJSE[[id number]]                Abnormal            Final result                 Please view results for these tests on the individual orders.   MAGNESIUM   Result Value Ref Range    MAGNESIUM 2.2 1.6 - 2.5 mg/dL   PHOSPHORUS   Result Value Ref Range    PHOSPHORUS 4.0 2.3 - 4.0 mg/dL   CBC WITH DIFF   Result Value Ref Range    WBC 7.9 3.5 - 11.0 x103/uL    RBC 4.76 4.06 - 5.63 x106/uL    HGB 14.5 12.5 - 16.3 g/dL    HCT 07.3 71.0 - 62.6 %    MCV 91.8 78.0 - 100.0 fL    MCH 30.5 27.4 - 33.0 pg    MCHC 33.2 32.5 - 35.8 g/dL    RDW 94.8 (H) 54.6 - 15.0 %    PLATELETS 176 140 - 450 x103/uL    MPV 8.9 7.5 - 11.5 fL    NEUTROPHIL % 74 %    LYMPHOCYTE % 12 %    MONOCYTE %  10 %    EOSINOPHIL % 3 %    BASOPHIL % 1 %    NEUTROPHIL # 5.83 1.50 - 7.70 x103/uL    LYMPHOCYTE # 0.97 (L) 1.00 - 4.80 x103/uL    MONOCYTE # 0.81 0.30 - 1.00 x103/uL    EOSINOPHIL # 0.25 0.00 - 0.50 x103/uL    BASOPHIL # 0.05 0.00 - 0.20 x103/uL   TROPONIN-I   Result Value Ref Range    TROPONIN I 265 (H) 0 - 30 ng/L   TROPONIN-I   Result Value Ref Range    TROPONIN I 241 (H) 0 - 30 ng/L   TYPE AND SCREEN   Result Value Ref Range    UNITS ORDERED NOT STATED         ABO/RH(D) A POSITIVE     ANTIBODY SCREEN NEGATIVE     SPECIMEN EXPIRATION DATE 05/23/2017    POC BLOOD GLUCOSE (RESULTS)   Result Value Ref Range    GLUCOSE, POC 149 (H) 70 - 105 mg/dL    Narrative    RN Notified   POC BLOOD GLUCOSE (RESULTS)   Result Value Ref Range    GLUCOSE, POC 115 (H) 70 - 105 mg/dL    Narrative    RN Notified   POC BLOOD GLUCOSE (RESULTS)   Result Value Ref Range    GLUCOSE, POC 151 (H) 70 - 105 mg/dL   POC BLOOD GLUCOSE (RESULTS)    Result Value Ref Range    GLUCOSE, POC 136 (H) 70 - 105 mg/dL    Narrative    RN Notified       Radiology:  No new imaging.    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 Plan is for medical management of patient's CAD. Currently being diuresed with IV Lasix.    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 BiV-D, NYHA Class IV  CAD s/p PCI  Acute on Chronic Systolic Heart Failure   -Pt with multiple vessel disease on catheterizations however is an unsuitable candidate for high-risk intervention given his comorbidities and functional  -Increased IV Lasix 80 mg to TID and assess response; repeat BMP in PM   -Strict I and O's  -Continue ASA 81 mg   -Start hydralazine 10 mg TID and isordil 10 mg TID  -Hold home losartan 100 mg QD, coreg 12.5 BID  -Palliative consulted for goal clarification: treatment limitations ordered    Atrial Fibrillation  -CHADSVASC 6, HAS BLED 5  -S/p BiV-D in 2015, AVN in 2017  -Continue home digoxin 125 mcg QD  -Anticoagulation with home Eliquis 5 mg BID    Dementia  -Fully oriented on exam  -Continue Aricept 10 mg QHS    IDDM2  -Home Lantus is 15 U QHS   -Continue conservatively at 5 U QHS  -conservative SSI  -Hold home metformin    Chronic Medical Conditions:  -Vitamin D def: continue 1000 U D3 QD  -Fe Deficiency Anemia: continue Fe replacement  -GERD: pepcid while inpatient       Coronary artery disease therapies:  - ASA: 81 mg daily  -  P2Y12 inhibitor (Plavix, Brilinta, Effient): N/A  - Statin: allergic to statin  - Specific BB/ACE-i as below    Heart failure therapies:  - ACE-i/ARB: No due to elevated Cr  - Beta blocker: No due to low cardiac output  -  Aldosterone antagonist: No: Why? Elevated Cr  - ICD: Yes  - CRT: Yes    Prophylaxis:                          DVT/PE:  Apixaban                          GI: H2 blocker  Diet and Nutrition: Cardiac diet  PT/OT: Ordered  Code status: No CPR    Disposition: Nursing facility    Sardar Marilynn Latino, MD        I saw and examined the patient.  I reviewed the resident's note.  I agree with the findings and plan of care as documented in the resident's note.  Any exceptions/additions are edited/noted.  If VT recurs may start amiodarone  John Giovanni, DO

## 2017-05-21 NOTE — Nurses Notes (Addendum)
05/21/17 0046   Vital Signs   Heart Rate 89   BP (Non-Invasive) 119/70   MAP (Non-Invasive) 82 mmHG     Patient experienced an 8 beat run of V-tach. Upon assessment he stated he could feel his heart beating in his chest. Denies chest pain. VSS.       Pt groaning in room, states he feels chest pressure. Service notified. Orders for 12 Lead EKG and Troponin. Upon reassessment patient denies pressure. Will continue to monitor.

## 2017-05-21 NOTE — Care Plan (Signed)
[facility]  Rehabilitation Services  Physical Therapy Initial Evaluation    Patient Name: [name]  Date of Birth:   Height: Height: 182.9 cm (6')  Weight: Weight: 86.6 kg (190 lb 14.7 oz)  Room/Bed: 18/A  Payor: MEDICARE / Plan: MEDICARE PART A AND B / Product Type: Medicare /     Assessment:      Pt pleasant and cooperative throughout. He was oriented, but required intermittent cuing for safety as he was mildly impulsive. Pt has a limited activity tolerance, becoming fatigued after 36' of ambulation. Pt would benefit from returning to SNF when medically appropriate for D/C to maintain his safety and improve his independence.    Discharge Needs:    Equipment Recommendation: TBD    Discharge Disposition: skilled nursing facility    JUSTIFICATION OF DISCHARGE RECOMMENDATION   Based on current diagnosis, functional performance prior to admission, and current functional performance, this patient requires continued PT services in skilled nursing facility in order to achieve significant functional improvements in these deficit areas: aerobic capacity/endurance, gait, locomotion, and balance.        Plan:   Current Intervention: balance training, bed mobility training, gait training, transfer training, patient/family education  To provide physical therapy services minimum of 2x/week  for duration of until discharge.    The risks/benefits of therapy have been discussed with the patient/caregiver and he/she is in agreement with the established plan of care.       Subjective & Objective      05/21/17 0919   Therapist Pager   PT Assigned/ Pager # Sherrilyn RistKari [phone]   Rehab Session   Document Type evaluation   Total PT Minutes: 25   Patient Effort good   Symptoms Noted During/After Treatment fatigue   General Information   Patient Profile Reviewed? yes   Onset of Illness/Injury or Date of Surgery 05/20/17   Pertinent History of Current Functional Problem [name] is a 76 y.o. male with history  of CAD s/p PCI, ICM HFrEF (20-25%), AFib s/p AICD s/p AVN ablation on Eliquis and dementia who presents with acute on chronic systolic heart failure. Plan is for medical management of patient's CAD. Currently being diuresed with IV Lasix.   Medical Lines PIV Line;Telemetry   Respiratory Status room air   Existing Precautions/Restrictions DNR;fall precautions   Mutuality/Individual Preferences   Patient Specific Preferences Goes by [name]   Patient Specific Goals (Include Timeframe) get better and go home   Patient Specific Interventions OOB w/FWW Ax1   Mutuality Comment Pt and RN agreeable for pt to be seen for PT eval, seen alongside OT for clustered care   Living Environment   Lives With facility resident   Living Arrangements *nursing home   Living Environment Comment [place]. States he has been there for 5 months and receives skilled therapy services.   Functional Level Prior   Ambulation 3 - assistive equipment and person   Transferring 3 - assistive equipment and person   Toileting 3 - assistive equipment and person   Bathing 3 - assistive equipment and person   Dressing 2 - assistive person   Eating 0 - independent   Communication 0 - understands/communicates without difficulty   Prior Functional Level Comment Pt reports he can ambulate short distances with use of FWW in the SNF. Pt required SPV for ADLs and transfers. He requires assist for dressing and bathing.   Pre Treatment Status   Pre  Treatment Patient Status Patient supine in bed;Call light within reach;Telephone within reach;Sitter select activated;Nurse approved session   Support Present Pre Treatment  None   Communication Pre Treatment  Nurse   Cognitive Assessment/Interventions   Behavior/Mood Observations behavior appropriate to situation, WNL/WFL   Orientation Status  oriented x 4   Attention WNL/WFL   Follows Commands  follows multi-step commands;verbal cues/prompting required   Comment Pt oriented t/o, but  requires intermittent cues to maintain safety   Vital Signs   Pre-Treatment Heart Rate (beats/min) 80   Post-treatment Heart Rate (beats/min) 79   Pre SpO2 (%) 98   O2 Delivery Pre Treatment room air   Post SpO2 (%) 98   O2 Delivery Post Treatment room air   Vitals Comment VSS   Pain Assessment   Pain Scale: Numbers, Pretreatment 0/10 - no pain   Pain Scale: Numbers, Post-Treatment 0/10 - no pain   Pre/Post Treatment Pain Comment Pt denies pain t/o   RLE Assessment   RLE Assessment X-Exceptions   RLE ROM WFL   RLE Strength Grossly 4-/5   RLE Sensation no sensation deficits identified   LLE Assessment   LLE Assessment X-Exceptions   LLE ROM WFL   LLE Strength Grossly 4-/5   LLE Sensation no sensation deficits identified   Bed Mobility Assessment/Treatment   Supine-Sit Independence minimum assist (75% patient effort)   Sit to Supine, Independence not tested   Safety Issues impaired trunk control for bed mobility   Impairments strength decreased;endurance;coordination impaired   Comment  Min Ax1 required for trunk management when coming to EOB sitting from supine. Pt able to perform scooting w/o physical assist. Pt able to maintain seated balance without physical assist as well   Transfer Assessment/Treatment   Sit-Stand Independence  contact guard assist;verbal cues required   Stand-Sit Independence  contact guard assist;verbal cues required   Sit-Stand-Sit, Assist Device front wheeled walker   Toilet Transfer Independence contact guard assist;verbal cues required   Toilet Transfer Assist Device grab bar   Transfer Safety Issues balance decreased during turns   Transfer Impairments balance impaired;coordination impaired;endurance;strength decreased   Transfer Comment Pt able to complete functional STS transfers w/FWW and CGA. VCs required for proper hand placement during completion of transfers. Cuing also required to maintain safety as pt is mildly impulsive.   Gait Assessment/Treatment   Independence  contact guard  assist   Assistive Device  rolling walker   Distance in Feet 36   Gait Speed moderate   Deviations  double stance time increased   Safety Issues  balance decreased during turns   Impairments  balance impaired;endurance;strength decreased   Comment Pt able to ambulate 36' total w/CGA and FWW, but pt fatigued following OOB activity and required a seated rest.   Balance Skill Training   Comment w/FWW   Sitting Balance: Static good balance   Sitting, Dynamic (Balance) good balance   Sit-to-Stand Balance fair balance   Standing Balance: Static fair balance   Standing Balance: Dynamic fair balance   Systems Impairment Contributing to Balance Disturbance musculoskeletal   Identified Impairments Contributing to Balance Disturbance decreased strength   Post Treatment Status   Post Treatment Patient Status Patient sitting in bedside chair or w/c;Call light within reach;Telephone within reach;Sitter select activated   Support Present Post Treatment  None   Plan of Care Review   Plan Of Care Reviewed With patient   Physical Therapy Clinical Impression   Assessment Pt pleasant and cooperative throughout. He was oriented,  but required intermittent cuing for safety as he was mildly impulsive. Pt has a limited activity tolerance, becoming fatigued after 36' of ambulation. Pt would benefit from returning to SNF when medically appropriate for D/C to maintain his safety and improve his independence.   Patient/Family Goals Statement  to go home   Criteria for Skilled Therapeutic yes;treatment indicated   Pathology/Pathophysiology Noted musculoskeletal;cardiovascular   Impairments Found (describe specific impairments) aerobic capacity/endurance;gait, locomotion, and balance   Functional Limitations in Following  self-care;home management;community/leisure   Rehab Potential good, to achieve stated therapy goals   Therapy Frequency minimum of 2x/week   Predicted Duration of Therapy Intervention (days/wks) until discharge   Anticipated  Equipment Needs at Discharge (PT Clinical Impression) TBD   Anticipated Discharge Disposition  skilled nursing facility   Highest level of Mobility score   Exercise Level 7- Walked 25 feet or more   Evaluation Complexity Justification   Patient History: Co-morbity/factors that Impact Plan of Care CHF: decreased cardioplumonary performance impacting function;One or more other medical co-morbidity   Examination Components Range of motion;Strength;Vital signs (BP, HR, RR, or O2 saturation);Balance;Bed mobility;Transfers;Ambulation   Presentation Evolving: Symptoms, complaints, characteristics of condition changing &/or cognitive deficits present   Clinical Decision Making Moderate complexity   Evaluation Complexity Moderate complexity   Care Plan Goals   PT Rehab Goals Bed Mobility Goal;Gait Training Goal;Transfer Training Goal   Bed Mobility Goal   Bed Mobility Goal, Date Established 05/21/17   Bed Mobility Goal, Time to Achieve by discharge   Bed Mobility Goal, Activity Type all bed mobility activities   Bed Mobility Goal, Independence Level supervision required   Gait Training  Goal, Distance to Achieve   Gait Training  Goal, Date Established 05/21/17   Gait Training  Goal, Time to Achieve by discharge   Gait Training  Goal, Independence Level stand-by assistance   Gait Training  Goal, Assist Device walker, rolling   Gait Training  Goal, Distance to Achieve 50   Transfer Training Goal   Transfer Training Goal, Date Established 05/21/17   Transfer Training Goal, Time to Achieve by discharge   Transfer Training Goal, Activity Type all transfers   Transfer Training Goal, Independence Level supervision required   Transfer Training Goal, Assist Device walker, rolling   Planned Therapy Interventions, PT Eval   Planned Therapy Interventions (PT Eval) balance training;bed mobility training;gait training;transfer training;patient/family education       Therapist:   Shiela Mayer, PT   Pager #: [phone]

## 2017-05-21 NOTE — Consults (Signed)
[facility]  Supportive/Palliative Care Medicine Consult  Follow Up Note    Jim, [name], 76 y.o. male  Date of Service: 05/21/2017  Date of Birth:      Hospital Day:  LOS: 1 day   Goals of care completed. Supportive Care will sign off at this time. Please re-consult if needed. Thank you for this consult.  Foster Simpson, RN

## 2017-05-21 NOTE — Care Plan (Signed)
[facility]  Occupational Therapy Initial Evaluation    Patient Name: [name]  Date of Birth:   Height: Height: 182.9 cm (6')  Weight: Weight: 86.6 kg (190 lb 14.7 oz)  Room/Bed: 18/A  Payor: MEDICARE / Plan: MEDICARE PART A AND B / Product Type: Medicare /     Assessment:   Pt tolerated OT evaluation fairly well this date. Pt presents with acute on chronic systolic heart failure. Pt currently presents with functional limitations including impaired balance, generalized weakness, decreased functional activity tolerance, decreased safety and awareness, hindering his ability to safely and IND complete mobility and self-care tasks without assistance at this time. Pt completed functional bed mobility with min A x1, STS transfer with CGA and cues for safe hand placement, toilet transfer with cues to utilize grab bar and CGA, LB dressing tasks and standing grooming tasks at sink level with CGA and cues for sequencing and task completion. From OT standpoint, recommend return to SNF for continued skilled OT intervention to maximize functional independence and safety. OT will continue to follow.      Discharge Needs:   Equipment Recommendation: none anticipated     Discharge Disposition: skilled nursing facility    JUSTIFICATION OF DISCHARGE RECOMMENDATION   Based on current diagnosis, functional performance prior to admission, and current functional performance, this patient requires continued OT services in skilled nursing facility  in order to achieve significant functional improvements.    Plan:   Current Intervention: ADL retraining, IADL retraining, balance training, bed mobility training, endurance training, strengthening, transfer training    To provide Occupational therapy services 1x/day, minimum of 2x/week, until discharge, until goals are met.     The risks/benefits of therapy have been discussed with the patient/caregiver and he/she is in agreement with the  established plan of care.       Subjective & Objective      05/21/17 1610   Therapist Pager   OT Assigned/ Pager # Rosabell Geyer [phone]   Rehab Session   Document Type evaluation   Total OT Minutes: 25   Patient Effort good   Symptoms Noted During/After Treatment fatigue   General Information   Patient Profile Reviewed? yes   Onset of Illness/Injury or Date of Surgery 05/20/17   Pertinent History of Current Functional Problem [name] is a 76 y.o. 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 Plan is for medical management of patient's CAD. Currently being diuresed with IV Lasix.   Medical Lines PIV Line;Telemetry   Respiratory Status room air   Existing Precautions/Restrictions DNR;fall precautions   Pre Treatment Status   Pre Treatment Patient Status Patient supine in bed;Call light within reach;Telephone within reach;Nurse approved session   Support Present Pre Treatment  None   Communication Pre Treatment  Nurse   Mutuality/Individual Preferences   Patient Specific Preferences Goes by [name]   Patient Specific Goals (Include Timeframe) To get better and go home   Patient Specific Interventions OOB with FWW and assist x1; OOB to chair for all meals   Mutuality Comment Pt and RN agreeable to be seen for OT evaluation at this time; seen alongside PT for clustered care and professional assistance.   Living Environment   Lives With facility resident   [facility] [place] Pt currently resides at a nursing home  in [place]. Reports he has been there for about 5 months. Per patient, has been receiving skilled therapy services.   Functional Level Prior   Ambulation 3 - assistive equipment and person   Transferring 3 - assistive equipment and person   Toileting 3 - assistive equipment and person   Bathing 3 - assistive equipment and person   Dressing 2 -  assistive person   Eating 0 - independent   Communication 0 - understands/communicates without difficulty   Prior Functional Level Comment Pt reports he typically ambulates short distances with a FWW within the nursing home. Receives supervision for ADL transfers and assistance from staff for dressing, bathing.    Vital Signs   Pre-Treatment Heart Rate (beats/min) 80   Post-treatment Heart Rate (beats/min) 79   Pre SpO2 (%) 98   O2 Delivery Pre Treatment room air   Post SpO2 (%) 98   O2 Delivery Post Treatment room air   Vitals Comment VSS t/o   Pain Assessment   Pain Scale: Numbers, Pretreatment 0/10 - no pain   Pain Scale: Numbers, Post-Treatment 0/10 - no pain   Pre/Post Treatment Pain Comment Pt denies any pain throughout time of session this date.   Coping/Psychosocial   Observed Emotional State accepting;calm;cooperative   Verbalized Emotional State acceptance   Coping/Psychosocial Response Interventions   Plan Of Care Reviewed With patient   Cognitive Assessment/Interventions   Behavior/Mood Observations alert;behavior appropriate to situation, WNL/WFL;cooperative   Orientation Status  oriented x 4   Attention WNL/WFL   Follows Commands  follows multi-step commands;verbal cues/prompting required   Comment Pt alert, cooperative, oriented x4 throughout time of session. Able to appropriately follow commands with intermittent cues for safety and task initiation.   RUE Assessment   RUE Assessment X- Exceptions   RUE ROM WFL   RUE Strength grossly 4-/5   LUE Assessment   LUE Assessment X-Exceptions   LUE ROM WFL   LUE Strength grossly 4-/5   Grip Strength   Grip Left (4/5) good, left   Right Grip (4/5) good, right   Mobility Assessment/Training   Mobility Comment Pt ambulated mobility distance of a total of 36 feet within room with use of FWW, CGA for balance, steadying and cues for FWW sequencing and safety. No LOB noted throughout.   Bed Mobility Assessment/Treatment   Supine-Sit Independence minimum assist (75%  patient effort)   Sit to Supine, Independence not tested   Safety Issues impaired trunk control for bed mobility   Impairments coordination impaired;endurance;strength decreased   Comment  Supine to sit transfer to EOB with min A x1 required for trunk to upright position. Able to scoot hips towards EOB with increased time and effort and SBA for safety.   Transfer Assessment/Treatment   Sit-Stand Independence  contact guard assist;verbal cues required   Stand-Sit Independence  contact guard assist;verbal cues required   Sit-Stand-Sit, Assist Device front wheeled walker   Toilet Transfer Independence contact guard assist;verbal cues required   Toilet Transfer Assist Device grab bar   Transfer Impairments balance impaired;coordination impaired;endurance;strength decreased   Transfer Comment Completed functional STS transfers with use of FWW, CGA for balance, and cues for safe hand placement. Also completed functional toilet transfer with VCs to use grab bar, CGA for balance and cues for safety.   ADL Assessment/Intervention   ADL Comments LB dressing, toileting, standing grooming   Lower Body Dressing Assessment/Training   Position sitting;standing   Independence Level  contact guard assist   Impairments activity tolerance  impaired;balance impaired;flexibility decreased;strength decreased   Comment Able to don hospital pants while seated/standing EOB with CGA for balance, steadying to secure pants around waist.   Toileting Assessment/Training   Assistive Devices grab bar   Position sitting;standing   Independence Level  contact guard assist   Impairments activity tolerance impaired;balance impaired;coordination impaired;strength decreased   Comment Functional toilet transfer with cues to use grab bar and CGA for balance, steadying.   Grooming Assessment/Training   Position standing   Independence Level contact guard assist   Impairments activity tolerance impaired;balance impaired;coordination impaired;strength  decreased   Comment Completed oral hygiene task while standing at sink level with CGA for steadying and increased time and effort required. Cues for task completion as pt left water running.   Motor Skills/Interventions   Additional Documentation Functional Endurance (Group)   Information systems manager   Comment with use of FWW   Sitting Balance: Static good balance   Sitting, Dynamic (Balance) good balance   Sit-to-Stand Balance fair balance   Standing Balance: Static fair balance   Standing Balance: Dynamic fair balance   Systems Impairment Contributing to Balance Disturbance musculoskeletal   Identified Impairments Contributing to Balance Disturbance decreased strength   Functional Endurance Training   Comment, Functional Endurance fair   Post Treatment Status   Post Treatment Patient Status Patient sitting in bedside chair or w/c;Call light within reach;Telephone within reach;Sitter select activated   Support Present Post Treatment  None   Care Plan Goals   OT Rehab Goals Occupational Therapy Goal;Bed Mobility Goal;Grooming Goal;LB Dressing Goal;Toileting Goal;Transfer Training Goal   Occupational Therapy Goals   OT Goal, Date Established 05/21/17   OT Goal, Time to Achieve by discharge   OT Goal, Activity Type ambulate functional household distance with use of FWW and SPV   Bed Mobility Goal   Bed Mobility Goal, Date Established 05/21/17   Bed Mobility Goal, Time to Achieve by discharge   Bed Mobility Goal, Activity Type all bed mobility activities   Bed Mobility Goal, Independence Level supervision required   Grooming Goal   Grooming Goal, Date Established 05/21/17   Grooming Goal, Time to Achieve by discharge   Grooming Goal, Activity Type all grooming tasks   Grooming Goal, Independence  supervision required   Grooming Goal, Position standing   Grooming Goal, Additional Goal standing grooming routine   LB Dressing Goal   LB Dressing Goal, Date Established 05/21/17   LB Dressing Goal, Time to Achieve by  discharge   LB Dressing Goal, Activity Type all lower body dressing tasks   LB Dressing Goal, Independence Level supervision required   LB Dressing Goal, Additional Goal seated and standing   Toileting Goal   Toileting Goal, Date Established 05/21/17   Toileting Goal, Time to Achieve by discharge   Toileting Goal, Activity Type all toileting tasks   Toileting Goal, Independence Level supervision required   Toileting Goal, Assist Device grab bar   Transfer Training Goal   Transfer Training Goal, Date Established 05/21/17   Transfer Training Goal, Time to Achieve by discharge   Transfer Training Goal, Activity Type all transfers   Transfer Training Goal, Independence Level supervision required   Transfer Training Goal, Assist Device walker, rolling   Planned Therapy Interventions, OT Eval   Planned Therapy Interventions ADL retraining;IADL retraining;balance training;bed mobility training;endurance training;strengthening;transfer training   Occupational Therapy Clinical Impression   Functional Level at Time of Session Pt tolerated OT evaluation fairly well this date. Pt presents with acute on chronic  systolic heart failure. Pt currently presents with functional limitations including impaired balance, generalized weakness, decreased functional activity tolerance, decreased safety and awareness, hindering his ability to safely and IND complete mobility and self-care tasks without assistance at this time. Pt completed functional bed mobility with min A x1, STS transfer with CGA and cues for safe hand placement, toilet transfer with cues to utilize grab bar and CGA, LB dressing tasks and standing grooming tasks at sink level with CGA and cues for sequencing and task completion. From OT standpoint, recommend return to SNF for continued skilled OT intervention to maximize functional independence and safety. OT will continue to follow.   Criteria for Skilled Therapeutic Interventions Met (OT Eval) yes;treatment indicated    Rehab Potential  good, to achieve stated therapy goals   Therapy Frequency 1x/day;minimum of 2x/week   Predicted Duration of Therapy until discharge;until goals are met   Anticipated Equipment Needs at Discharge none anticipated   Anticipated Discharge Disposition skilled nursing facility   Highest level of Mobility score   Exercise Level 7- Walked 25 feet or more   Evaluation Complexity Justification   Occupational Profile Review Expanded review   Performance Deficits 3-5 deficits;Sequencing;Safety awareness;Strength;Endurance;Mobility   Clinical Decision Making Moderate analytic complexity   Evaluation Complexity Moderate       Therapist:   Charlott Rakes, MOT, OTR/L   Pager #: [phone]

## 2017-05-21 NOTE — Care Plan (Signed)
Problem: Patient Care Overview (Adult,OB)  Goal: Plan of Care Review(Adult,OB)  The patient and/or their representative will communicate an understanding of their plan of care   Outcome: Ongoing (see interventions/notes)  IV diuresis continued. Patient experienced a run of V-Tach as well as other episodes of ectopy; see note. Patient remains v paced 70's-80's. Fall and skin precautions maintained. Frequent repositioning encouraged. Sitter select active and audible. Video monitoring continued. Call bell within reach. VSS. Plan to d/c to home nursing facility when medically stable. Emotional support provided to patient.    Goal: Individualization/Patient Specific Goal(Adult/OB)  Outcome: Ongoing (see interventions/notes)    Goal: Interdisciplinary Rounds/Family Conf  Outcome: Ongoing (see interventions/notes)      Problem: Cardiac: ACS (Acute Coronary Syndrome) (Adult)  Prevent and manage potential problems including:  1. cardiovascular structural defects  2. chest pain (angina)  3. dysrhythmia/arrhythmia  4. embolism  5. heart failure/shock  6. ischemia leading to infarction  7. pericarditis  8. situational response   Goal: Signs and Symptoms of Listed Potential Problems Will be Absent, Minimized or Managed (Cardiac: ACS)  Signs and symptoms of listed potential problems will be absent, minimized or managed by discharge/transition of care (reference Cardiac: ACS (Acute Coronary Syndrome) (Adult) CPG).   Outcome: Ongoing (see interventions/notes)      Problem: Fall Risk (Adult)  Goal: Absence of Falls  Patient will demonstrate the desired outcomes by discharge/transition of care.   Outcome: Ongoing (see interventions/notes)      Problem: Nutrition, Imbalanced: Inadequate Oral Intake (Adult)  Goal: Improved Oral Intake  Patient will demonstrate the desired outcomes by discharge/transition of care.   Outcome: Ongoing (see interventions/notes)    Goal: Prevent Further Weight Loss  Patient will demonstrate the desired  outcomes by discharge/transition of care.   Outcome: Ongoing (see interventions/notes)      Problem: Skin Injury Risk (Adult,Obstetrics,Pediatric)  Goal: Skin Health and Integrity  Patient will demonstrate the desired outcomes by discharge/transition of care.   Outcome: Ongoing (see interventions/notes)

## 2017-05-22 LAB — BASIC METABOLIC PANEL
ANION GAP: 9 mmol/L (ref 4–13)
BUN/CREA RATIO: 16 (ref 6–22)
BUN: 31 mg/dL — ABNORMAL HIGH (ref 8–25)
CALCIUM: 9.4 mg/dL (ref 8.5–10.2)
CHLORIDE: 104 mmol/L (ref 96–111)
CO2 TOTAL: 28 mmol/L (ref 22–32)
CO2 TOTAL: 28 mmol/L (ref 22–32)
CREATININE: 1.89 mg/dL — ABNORMAL HIGH (ref 0.62–1.27)
ESTIMATED GFR: 35 mL/min/{1.73_m2} — ABNORMAL LOW (ref >59)
GLUCOSE: 81 mg/dL (ref 65–139)
POTASSIUM: 3.5 mmol/L (ref 3.5–5.1)
SODIUM: 141 mmol/L (ref 136–145)

## 2017-05-22 LAB — POC BLOOD GLUCOSE (RESULTS)
GLUCOSE, POC: 102 mg/dL (ref 70–105)
GLUCOSE, POC: 139 mg/dL — ABNORMAL HIGH (ref 70–105)
GLUCOSE, POC: 140 mg/dL — ABNORMAL HIGH (ref 70–105)
GLUCOSE, POC: 79 mg/dL (ref 70–105)

## 2017-05-22 LAB — DIGOXIN LEVEL: DIGOXIN LEVEL: 1.1 ng/mL (ref 0.8–2.0)

## 2017-05-22 LAB — MAGNESIUM: MAGNESIUM: 1.8 mg/dL (ref 1.6–2.5)

## 2017-05-22 MED ORDER — POLYETHYLENE GLYCOL 3350 17 GRAM ORAL POWDER PACKET
17.0000 g | Freq: Every day | ORAL | Status: DC
Start: 2017-05-22 — End: 2017-05-27
  Administered 2017-05-22: 17 g via ORAL
  Administered 2017-05-23 – 2017-05-27 (×5): 0 g via ORAL
  Filled 2017-05-22 (×4): qty 1

## 2017-05-22 MED ORDER — POTASSIUM CHLORIDE ER 20 MEQ TABLET,EXTENDED RELEASE(PART/CRYST)
40.0000 meq | ORAL_TABLET | ORAL | Status: AC
Start: 2017-05-22 — End: 2017-05-22
  Administered 2017-05-22: 09:00:00 40 meq via ORAL
  Filled 2017-05-22: qty 2

## 2017-05-22 MED ORDER — SENNOSIDES 8.6 MG-DOCUSATE SODIUM 50 MG TABLET
1.0000 | ORAL_TABLET | Freq: Two times a day (BID) | ORAL | Status: DC
Start: 2017-05-22 — End: 2017-05-27
  Administered 2017-05-22 – 2017-05-24 (×4): 1 via ORAL
  Administered 2017-05-24 – 2017-05-26 (×4): 0 via ORAL
  Administered 2017-05-27: 1 via ORAL
  Filled 2017-05-22 (×8): qty 1

## 2017-05-22 MED ADMIN — sennosides 8.6 mg-docusate sodium 50 mg tablet: ORAL | @ 19:00:00

## 2017-05-22 MED ADMIN — sodium chloride 0.9 % intravenous solution: ORAL | @ 09:00:00

## 2017-05-22 MED ADMIN — nystatin 100,000 unit/gram topical ointment: ORAL | @ 15:00:00 | NDC 45802004835

## 2017-05-22 NOTE — Care Plan (Signed)
[facility]  Rehabilitation Services  Physical Therapy Progress Note      Patient Name: [name]  Date of Birth:   Height:  182.9 cm (6')  Weight:  83.8 kg (184 lb 11.9 oz)  Room/Bed: 18/A  Payor: MEDICARE / Plan: MEDICARE PART A AND B / Product Type: Medicare /     Assessment:     Pt able to ambulate increased distances this session, but demonstrated impaired balance requiring physical assist to recover from 3 total LOB during session. Pt somewhat impulsive which causes him to require CGA when performing functional mobility tasks. Pt is unsafe for home D/C at this time and pt would greatly benefit from D/C to SNF when medically appropriate to address his mobility deficits and allow him to improve his safety. Will continue to follow.    Discharge Needs:   Equipment Recommendation: TBD    Discharge Disposition: skilled nursing facility    JUSTIFICATION OF DISCHARGE RECOMMENDATION   Based on current diagnosis, functional performance prior to admission, and current functional performance, this patient requires continued PT services in skilled nursing facility in order to achieve significant functional improvements in these deficit areas: aerobic capacity/endurance, gait, locomotion, and balance.      Plan:   Continue to follow patient according to established plan of care.  The risks/benefits of therapy have been discussed with the patient/caregiver and he/she is in agreement with the established plan of care.     Subjective & Objective:      05/22/17 1056   Therapist Pager   PT Assigned/ Pager # Sherrilyn Rist [phone]   Rehab Session   Document Type therapy note (daily note)   Total PT Minutes: 19   Patient Effort good   Symptoms Noted During/After Treatment fatigue   General Information   Patient Profile Reviewed? yes   Medical Lines PIV Line;Telemetry   Respiratory Status room air   Existing Precautions/Restrictions DNR;fall precautions   Mutuality/Individual Preferences   Patient Specific  Interventions OOB Ax1, FWW   Mutuality Comment Pt and RN agreeable for pt to be seen for PT tx   Pre Treatment Status   Pre Treatment Patient Status Patient supine in bed;Call light within reach;Telephone within reach;Sitter select activated;Nurse approved session   Support Present Pre Treatment  (EVS)   Communication Pre Treatment  Nurse   Cognitive Assessment/Interventions   Behavior/Mood Observations behavior appropriate to situation, WNL/WFL   Orientation Status  oriented x 4   Attention distractible   Follows Commands  follows multi-step commands;verbal cues/prompting required   Vital Signs   Pre-Treatment Heart Rate (beats/min) 78   Post-treatment Heart Rate (beats/min) 75   Pre SpO2 (%) 95   O2 Delivery Pre Treatment room air   Post SpO2 (%) 94   O2 Delivery Post Treatment room air   Vitals Comment VSS   Pain Assessment   Pain Scale: Numbers, Pretreatment 0/10 - no pain   Pain Scale: Numbers, Post-Treatment 0/10 - no pain   Pre/Post Treatment Pain Comment Denies pain this session   Bed Mobility Assessment/Treatment   Bed Mobility, Assistive Device bed rails   Supine-Sit Independence contact guard assist   Safety Issues impaired trunk control for bed mobility   Impairments balance impaired;endurance;strength decreased   Comment  With use of bed rails, pt required CGA for steadying upon coming to sitting.   Transfer Assessment/Treatment   Sit-Stand Independence  contact guard assist;verbal cues required   Stand-Sit Independence  contact guard assist;verbal cues required  Sit-Stand-Sit, Assist Device front wheeled walker   Toilet Transfer Independence minimum assist (75% patient effort);2 person assist required;verbal cues required   Toilet Transfer Assist Device grab bar   Transfer Impairments balance impaired;coordination impaired;endurance;strength decreased   Transfer Comment VCs provided for safe hand plancement on FWW during transfer. In bathroom, pt had 1 backward LOB requiring Min Ax2 to recover from  and maintain balance. This LOB was caused by the pt impulsivly attempting to sit on toilet w/o use of grab bar.   Gait Assessment/Treatment   Independence  minimum assist (75% patient effort);contact guard assist   Assistive Device  rolling walker   Distance in Feet 125x2   Gait Speed moderate   Deviations  double stance time increased   Safety Issues  balance decreased during turns   Impairments  balance impaired;endurance;strength decreased   Comment Pt had 2 LOB in halls requiring Min Ax1 to recover from 1 of these LOB and Min Ax2 to recover from the second. Pt ambulated 125' prior to taking a standing rest and was able to ambulate additional 125' to return to his room.    Balance Skill Training   Comment w/FWW   Sitting Balance: Static good balance   Sitting, Dynamic (Balance) good balance   Sit-to-Stand Balance fair balance   Standing Balance: Static fair balance   Standing Balance: Dynamic poor + balance   Systems Impairment Contributing to Balance Disturbance musculoskeletal   Identified Impairments Contributing to Balance Disturbance decreased strength   Post Treatment Status   Post Treatment Patient Status Patient sitting in bedside chair or w/c;Call light within reach;Telephone within reach;Sitter select activated   Support Present Post Treatment  None   Plan of Care Review   Plan Of Care Reviewed With patient   Physical Therapy Clinical Impression   Assessment Pt able to ambulate increased distances this session, but demonstrated impaired balance requiring physical assist to recover from 3 total LOB during session. Pt somewhat impulsive which causes him to require CGA when performing functional mobility tasks. Pt is unsafe for home D/C at this time and pt would greatly benefit from D/C to SNF when medically appropriate to address his mobility deficits and allow him to improve his safety. Will continue to follow.   Therapy Frequency minimum of 2x/week   Predicted Duration of Therapy Intervention (days/wks)  until discharge   Anticipated Equipment Needs at Discharge (PT Clinical Impression) TBD   Anticipated Discharge Disposition  skilled nursing facility       Therapist:   Shiela MayerKari Sims, PT   Pager #: [phone]

## 2017-05-22 NOTE — Care Plan (Signed)
Problem: Patient Care Overview (Adult,OB)  Goal: Plan of Care Review(Adult,OB)  The patient and/or their representative will communicate an understanding of their plan of care   Outcome: Ongoing (see interventions/notes)  Patient denied chest pain, SOB, and all other pain. IV diureses continued. Fall and skin precautions maintained. Frequent repositioning encouraged. Sitter select active and audible.Video monitoring discontinued. Call bell within reach. VSS. Plan to keep over the weekend for further diuresis and monitoring. Emotional support provided to patient.     Goal: Interdisciplinary Rounds/Family Conf  Outcome: Ongoing (see interventions/notes)      Problem: Cardiac: ACS (Acute Coronary Syndrome) (Adult)  Prevent and manage potential problems including:  1. cardiovascular structural defects  2. chest pain (angina)  3. dysrhythmia/arrhythmia  4. embolism  5. heart failure/shock  6. ischemia leading to infarction  7. pericarditis  8. situational response   Goal: Signs and Symptoms of Listed Potential Problems Will be Absent, Minimized or Managed (Cardiac: ACS)  Signs and symptoms of listed potential problems will be absent, minimized or managed by discharge/transition of care (reference Cardiac: ACS (Acute Coronary Syndrome) (Adult) CPG).   Outcome: Ongoing (see interventions/notes)      Problem: Fall Risk (Adult)  Goal: Absence of Falls  Patient will demonstrate the desired outcomes by discharge/transition of care.   Outcome: Ongoing (see interventions/notes)      Problem: Nutrition, Imbalanced: Inadequate Oral Intake (Adult)  Goal: Improved Oral Intake  Patient will demonstrate the desired outcomes by discharge/transition of care.   Outcome: Ongoing (see interventions/notes)    Goal: Prevent Further Weight Loss  Patient will demonstrate the desired outcomes by discharge/transition of care.   Outcome: Ongoing (see interventions/notes)      Problem: Skin Injury Risk (Adult,Obstetrics,Pediatric)  Goal: Skin  Health and Integrity  Patient will demonstrate the desired outcomes by discharge/transition of care.   Outcome: Ongoing (see interventions/notes)

## 2017-05-22 NOTE — Progress Notes (Signed)
[facility]  Medicine Progress Note    [name]  Date of service: 05/22/2017  Date of Admission:  05/20/2017    Hospital Day:  LOS: 2 days   Subjective:   Overnight and this AM, no events. Occasional PVCs. Negative 1.5L in past 24 hours and 1.9L since midnight.    Vital Signs:  Temp  Avg: 36.4 C (97.6 F)  Min: 36.3 C (97.3 F)  Max: 36.7 C (98 F)    Pulse  Avg: 75.7  Min: 71  Max: 80 BP  Min: 103/59  Max: 134/79   Resp  Avg: 16.4  Min: 14  Max: 18 SpO2  Avg: 94.8 %  Min: 92 %  Max: 98 %   Pain Score (Numeric, Faces): 0      Input/Output    Intake/Output Summary (Last 24 hours) at 05/22/17 1001  Last data filed at 05/22/17 0900   Gross per 24 hour   Intake              373 ml   Output             4415 ml   Net            -4042 ml    I/O last shift:  11/09 0800 - 11/09 1559  In: -   Out: 250 [Urine:250]       Current Facility-Administered Medications:  apixaban (ELIQUIS) tablet 5 mg Oral 2x/day   aspirin chewable tablet 81 mg 81 mg Oral Daily   cholecalciferol (VITAMIN D3) tablet 1,000 Units Oral Daily   donepezil (ARICEPT) tablet 5 mg Oral NIGHTLY   famotidine (PEPCID) tablet 20 mg Oral Daily   ferrous sulfate 324 mg (65 mg elemental IRON) tablet 324 mg Oral Daily before Breakfast   folic acid (FOLVITE) tablet 1 mg Oral Daily   furosemide (LASIX) 10 mg/mL injection 80 mg Intravenous Q8HRS   hydrALAZINE (APRESOLINE) tablet 10 mg Oral Q8HRS   insulin glargine (LANTUS) 100 units/mL injection 12 Units Subcutaneous NIGHTLY   isosorbide dinitrate (ISORDIL) tablet 10 mg Oral 3x/day AC   SSIP insulin lispro (HUMALOG) 100 units/mL injection 3-9 Units Subcutaneous 4x/day PRN       Physical Exam:  General: Appears stated age, in chronically poor health and in no acute distress.  Eyes: Conjunctiva clear, pupils equal and round.  HENT: ENT without erythema or injection, mouth mucous membranes moist.  Neck: Supple, symmetrical, trachea midline.  Respiratory: Lungs clear to auscultation bilaterally   Cardiovascular: Regular rate and rhythm, no extra heart sounds appreciated  Abdomen: Soft, non-tender, non-distended, bowel sounds normal.  Musculoskeletals: 2+ pitting edema in BL lower extremities  Skin: Skin warm and dry  Neurologic: Grossly normal, Alert and oriented x3.  Psychiatric: Normal affect.      Labs:  Results for orders placed or performed during the hospital encounter of 05/20/17 (from the past 24 hour(s))   BASIC METABOLIC PANEL   Result Value Ref Range    SODIUM 140 136 - 145 mmol/L    POTASSIUM 3.9 3.5 - 5.1 mmol/L    CHLORIDE 105 96 - 111 mmol/L    CO2 TOTAL 24 22 - 32 mmol/L    ANION GAP 11 4 - 13 mmol/L    CALCIUM 9.2 8.5 - 10.2 mg/dL    GLUCOSE 621112 65 - 308139 mg/dL    BUN 33 (H) 8 - 25 mg/dL    CREATININE 6.571.99 (H) 0.62 - 1.27 mg/dL    BUN/CREA RATIO  17 6 - 22    ESTIMATED GFR 33 (L) >59 mL/min/1.62m^2    Narrative    Hemolysis can alter results at this level (slight).   BASIC METABOLIC PANEL   Result Value Ref Range    SODIUM 141 136 - 145 mmol/L    POTASSIUM 3.5 3.5 - 5.1 mmol/L    CHLORIDE 104 96 - 111 mmol/L    CO2 TOTAL 28 22 - 32 mmol/L    ANION GAP 9 4 - 13 mmol/L    CALCIUM 9.4 8.5 - 10.2 mg/dL    GLUCOSE 81 65 - 037 mg/dL    BUN 31 (H) 8 - 25 mg/dL    CREATININE 0.96 (H) 0.62 - 1.27 mg/dL    BUN/CREA RATIO 16 6 - 22    ESTIMATED GFR 35 (L) >59 mL/min/1.67m^2   MAGNESIUM   Result Value Ref Range    MAGNESIUM 1.8 1.6 - 2.5 mg/dL   DIGOXIN LEVEL   Result Value Ref Range    DIGOXIN LEVEL 1.1 0.8 - 2.0 ng/mL   POC BLOOD GLUCOSE (RESULTS)   Result Value Ref Range    GLUCOSE, POC 200 (H) 70 - 105 mg/dL    Narrative    RN Notified   POC BLOOD GLUCOSE (RESULTS)   Result Value Ref Range    GLUCOSE, POC 119 (H) 70 - 105 mg/dL    Narrative    RN Notified   POC BLOOD GLUCOSE (RESULTS)   Result Value Ref Range    GLUCOSE, POC 193 (H) 70 - 105 mg/dL    Narrative    RN Notified   POC BLOOD GLUCOSE (RESULTS)   Result Value Ref Range    GLUCOSE, POC 79 70 - 105 mg/dL    Narrative    RN Notified        Radiology:  No new imaging.    PT/OT: Yes    Consults: Palliative    Hardware (lines, foley's, tubes): PIV    Assessment/ Plan:   Active Hospital Problems    Diagnosis   . 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 Plan is for medical management of patient's CAD. Currently being diuresed with IV Lasix.    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 BiV-D, NYHA Class IV  CAD s/p PCI  Acute on Chronic Systolic Heart Failure   -Pt with multiple vessel disease on catheterizations however is an unsuitable candidate for high-risk intervention given his comorbidities and functional  -IV Lasix 80 mg to TID and good response, will continue  -Strict I and O's  -Continue ASA 81 mg   -hydralazine 10 mg TID and isordil 10 mg TID  -Hold home losartan 100 mg QD, coreg 12.5 BID  -Palliative consulted for goal clarification: treatment limitations ordered    Atrial Fibrillation  -CHADSVASC 6, HAS BLED 5  -S/p BiV-D in 2015, AVN in 2017  -Will discontinue home digoxin at this time  -Anticoagulation with home Eliquis 5 mg BID    Dementia  -Fully oriented on exam  -Continue Aricept 10 mg QHS    IDDM2  -Home Lantus is 15 U QHS   -Continue conservatively at 5 U QHS  -conservative SSI  -Hold home metformin    Chronic Medical Conditions:  -Vitamin D def: continue 1000  U D3 QD  -Fe Deficiency Anemia: continue Fe replacement  -GERD: pepcid while inpatient       Coronary artery disease therapies:  - ASA: 81 mg daily  - P2Y12 inhibitor (Plavix, Brilinta, Effient): N/A  - Statin: allergic to statin  - Specific BB/ACE-i as below    Heart failure therapies:  - ACE-i/ARB: No due to elevated Cr  - Beta blocker: No due to low cardiac output  - Aldosterone antagonist: No: Why? Elevated Cr  - ICD: Yes  - CRT: Yes    Prophylaxis:                           DVT/PE:  Apixaban                          GI: H2 blocker  Diet and Nutrition: Cardiac diet  PT/OT: Ordered  Code status: No CPR    Disposition: Nursing facility            05/22/2017  I saw and examined the patient.  I reviewed the fellow's note.  I agree with the findings and plan of care as documented in the fellow's note.  Any exceptions/additions are edited/noted.    John Giovanni, DO

## 2017-05-22 NOTE — Care Management Notes (Signed)
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 Pt will return to [facility] upon d/c. Transportation will need addressed closer to d/c.      The patient will continue to be evaluated for developing discharge needs.     Case Manager: Purcell Mouton  Phone: [phone]

## 2017-05-22 NOTE — Care Plan (Signed)
[name]  Rehabilitation Services  Occupational Therapy Progress Note    Patient Name: [name]  Date of Birth:   Height:  182.9 cm (6')  Weight:  83.8 kg (184 lb 11.9 oz)  Room/Bed: 18/A  Payor: MEDICARE / Plan: MEDICARE PART A AND B / Product Type: Medicare /     Assessment:    Pt performed well during OT treatment session this date. Pt with improvements noted regarding functional activity tolerance evidenced via ability to ambulate ~100 feet x2 with use of FWW and CGA for steadying. Pt with 2 episodes of minor LOB requiring min A to recover. Min A x2 required to lower to toilet as pt attempted to transition too quickly and experienced a posterior LOB. Standing grooming task at sink level with CGA for steadying. Pt required intermittent cues for safety and sequencing throughout secondary to impulsivity with transitions. From OT standpoint, continue to recommend SNF placement upon d/c once medically appropriate. OT will continue to follow.      Discharge Needs:   Equipment Recommendation: none anticipated    Discharge Disposition:  skilled nursing facility     JUSTIFICATION OF DISCHARGE RECOMMENDATION   Based on current diagnosis, functional performance prior to admission, and current functional performance, this patient requires continued OT services in skilled nursing facility in order to achieve significant functional improvements.    Plan:   Continue to follow patient according to established plan of care.  The risks/benefits of therapy have been discussed with the patient/caregiver and he/she is in agreement with the established plan of care.     Subjective & Objective:      05/22/17 1055   Therapist Pager   OT Assigned/ Pager # Anasophia Pecor [phone]   Rehab Session   Document Type therapy note (daily note)   Total OT Minutes: 19   Patient Effort good   Symptoms Noted During/After Treatment fatigue   General Information   Patient Profile Reviewed? yes   Onset of Illness/Injury or Date  of Surgery 05/20/17   Pertinent History of Current Functional Problem [name] is a 76 y.o. 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 Plan is for medical management of patient's CAD. Currently being diuresed with IV Lasix.   Medical Lines PIV Line;Telemetry   Respiratory Status room air   Existing Precautions/Restrictions DNR;fall precautions   Pre Treatment Status   Pre Treatment Patient Status Patient supine in bed;Call light within reach;Telephone within reach;Sitter select activated;Nurse approved session   Support Present Pre Treatment  Other (See comments)  (EVS)   Communication Pre Treatment  Nurse   Mutuality/Individual Preferences   Patient Specific Preferences Goes by Bill   Patient Specific Goals (Include Timeframe) To get better and go home   Patient Specific Interventions OOB with FWW and assist x1   Mutuality Comment Pt and RN agreeable to be seen for OT treatment session at this time; seen alongside PT for clustered care and professional assistance.   Vital Signs   Pre-Treatment Heart Rate (beats/min) 78   Post-treatment Heart Rate (beats/min) 75   Pre SpO2 (%) 95   O2 Delivery Pre Treatment room air   Post SpO2 (%) 94   O2 Delivery Post Treatment room air   Vitals Comment VSS   Pain Assessment   Pain Scale: Numbers, Pretreatment 0/10 - no pain   Pain Scale:  Numbers, Post-Treatment 0/10 - no pain   Pre/Post Treatment Pain Comment Pt denies any pain throughout time of session this date.   Coping/Psychosocial   Observed Emotional State accepting;calm;cooperative   Verbalized Emotional State acceptance   Coping/Psychosocial Response Interventions   Plan Of Care Reviewed With patient   Cognitive Assessment/Interventions   Behavior/Mood Observations alert;behavior appropriate to situation, WNL/WFL;cooperative   Orientation Status  oriented x 4   Attention distractible    Follows Commands  follows multi-step commands;verbal cues/prompting required   Mobility Assessment/Training   Mobility Comment Pt ambulated functional mobility distance of ~100 feet x2 with use of FWW and CGA for balance, steadying. Pt with 2 minor LOB requiring min A to recover during mobility task.   Bed Mobility Assessment/Treatment   Bed Mobility, Assistive Device bed rails   Supine-Sit Independence contact guard assist   Sit to Supine, Independence not tested   Safety Issues impaired trunk control for bed mobility   Impairments balance impaired;endurance;strength decreased   Comment  Able to complete functional supine to sit transfer to EOB with use of bed rails and CGA for steadying.   Transfer Assessment/Treatment   Sit-Stand Independence  contact guard assist;verbal cues required   Stand-Sit Independence  contact guard assist;verbal cues required   Sit-Stand-Sit, Assist Device front wheeled walker   Toilet Transfer Independence minimum assist (75% patient effort);2 person assist required;verbal cues required   Toilet Transfer Assist Device grab bar   Transfer Impairments balance impaired;coordination impaired;endurance;strength decreased   Transfer Comment STS transfer from EOB with CGA for steadying and cues for safe hand placement. Required min A x2 to safely lower to toilet with use of grab bar as pt with impulsivity and attempted to lower to toilet without use of grab bar. Returned to standing with CGA.   ADL Assessment/Intervention   ADL Comments toileting, standing grooming   Toileting Assessment/Training   Assistive Devices grab bar   Position sitting;standing   Independence Level  minimum assist (75% patient effort);2 person assist required;verbal cues required   Impairments activity tolerance impaired;balance impaired;coordination impaired;strength decreased   Comment Required min A x2 to safely lower to toilet as pt attempted to lower to toilet before being close enough to transition and  experienced posterior LOB.    Grooming Assessment/Training   Position standing   Independence Level contact guard assist   Impairments activity tolerance impaired;balance impaired;coordination impaired;strength decreased   Comment Hand washing task while standing at sink level with CGA for steadying.   Motor Skills/Interventions   Additional Documentation Functional Endurance (Group)   Manufacturing engineer   Comment with use of FWW   Sitting Balance: Static good balance   Sitting, Dynamic (Balance) good balance   Sit-to-Stand Balance fair balance   Standing Balance: Static fair balance   Standing Balance: Dynamic fair balance   Systems Impairment Contributing to Balance Disturbance musculoskeletal   Identified Impairments Contributing to Balance Disturbance decreased strength   Functional Endurance Training   Comment, Functional Endurance improved from previous session   Post Treatment Status   Post Treatment Patient Status Patient sitting in bedside chair or w/c;Call light within reach;Telephone within reach;Sitter select activated   Support Present Post Treatment  None   Occupational Therapy Clinical Impression   Functional Level at Time of Session Pt performed well during OT treatment session this date. Pt with improvements noted regarding functional activity tolerance evidenced via ability to ambulate ~100 feet x2 with use of FWW and CGA for steadying. Pt with 2 episodes  of minor LOB requiring min A to recover. Min A x2 required to lower to toilet as pt attempted to transition too quickly and experienced a posterior LOB. Standing grooming task at sink level with CGA for steadying. Pt required intermittent cues for safety and sequencing throughout secondary to impulsivity with transitions. From OT standpoint, continue to recommend SNF placement upon d/c once medically appropriate. OT will continue to follow.   Anticipated Equipment Needs at Discharge none anticipated   Anticipated Discharge Disposition skilled  nursing facility       Therapist:   Gypsy Balsamiernie Stewart, MOT, OTR/L  Pager #: [phone]

## 2017-05-22 NOTE — Care Plan (Signed)
Problem: Patient Care Overview (Adult,OB)  Goal: Individualization/Patient Specific Goal(Adult/OB)  Outcome: Ongoing (see interventions/notes)  Patient received thorough assessment, vitals taken and all medications given. Patient came in for heart failure, planning a CABG; patient was found not to be a canadidate for surgery and is being diurese. Patient remains fall free under sitter select and video monitoring was restarted due to patient's confusion and behavior. Patient showed no signs of nutrition impairment and skin integrity remains unchanged, adhesive use was limited. Patient is resting at this time.  Goal: Interdisciplinary Rounds/Family Conf  Outcome: Ongoing (see interventions/notes)      Problem: Cardiac: ACS (Acute Coronary Syndrome) (Adult)  Prevent and manage potential problems including:  1. cardiovascular structural defects  2. chest pain (angina)  3. dysrhythmia/arrhythmia  4. embolism  5. heart failure/shock  6. ischemia leading to infarction  7. pericarditis  8. situational response   Goal: Signs and Symptoms of Listed Potential Problems Will be Absent, Minimized or Managed (Cardiac: ACS)  Signs and symptoms of listed potential problems will be absent, minimized or managed by discharge/transition of care (reference Cardiac: ACS (Acute Coronary Syndrome) (Adult) CPG).   Outcome: Ongoing (see interventions/notes)      Problem: Fall Risk (Adult)  Goal: Absence of Falls  Patient will demonstrate the desired outcomes by discharge/transition of care.   Outcome: Ongoing (see interventions/notes)      Problem: Nutrition, Imbalanced: Inadequate Oral Intake (Adult)  Goal: Improved Oral Intake  Patient will demonstrate the desired outcomes by discharge/transition of care.   Outcome: Ongoing (see interventions/notes)    Goal: Prevent Further Weight Loss  Patient will demonstrate the desired outcomes by discharge/transition of care.   Outcome: Ongoing (see interventions/notes)      Problem: Skin Injury Risk  (Adult,Obstetrics,Pediatric)  Goal: Skin Health and Integrity  Patient will demonstrate the desired outcomes by discharge/transition of care.   Outcome: Ongoing (see interventions/notes)

## 2017-05-22 NOTE — Nurses Notes (Cosign Needed)
Patient had an 11 beat run of AIVR. Patient asymptomatic.

## 2017-05-23 LAB — CBC WITH DIFF
BASOPHIL #: 0.04 10*3/uL (ref 0.00–0.20)
BASOPHIL %: 1 %
EOSINOPHIL #: 0.21 10*3/uL (ref 0.00–0.50)
EOSINOPHIL %: 3 %
HCT: 44 % (ref 36.7–47.0)
HGB: 14.5 g/dL (ref 12.5–16.3)
LYMPHOCYTE #: 1.37 10*3/uL (ref 1.00–4.80)
LYMPHOCYTE %: 17 %
LYMPHOCYTE %: 17 %
MCH: 30.3 pg (ref 27.4–33.0)
MCHC: 32.9 g/dL (ref 32.5–35.8)
MCHC: 32.9 g/dL (ref 32.5–35.8)
MCV: 92 fL (ref 78.0–100.0)
MONOCYTE #: 0.98 10*3/uL (ref 0.30–1.00)
MONOCYTE %: 12 %
MPV: 8.8 fL (ref 7.5–11.5)
NEUTROPHIL #: 5.36 10*3/uL (ref 1.50–7.70)
NEUTROPHIL %: 67 %
PLATELETS: 183 10*3/uL (ref 140–450)
RBC: 4.79 10*6/uL (ref 4.06–5.63)
RDW: 16 % — ABNORMAL HIGH (ref 12.0–15.0)
WBC: 8 10*3/uL (ref 3.5–11.0)

## 2017-05-23 LAB — BASIC METABOLIC PANEL
ANION GAP: 12 mmol/L (ref 4–13)
ANION GAP: 12 mmol/L (ref 4–13)
ANION GAP: 12 mmol/L (ref 4–13)
BUN/CREA RATIO: 14 (ref 6–22)
BUN/CREA RATIO: 13 (ref 6–22)
BUN: 28 mg/dL — ABNORMAL HIGH (ref 8–25)
BUN: 28 mg/dL — ABNORMAL HIGH (ref 8–25)
CALCIUM: 10.2 mg/dL (ref 8.5–10.2)
CALCIUM: 9.7 mg/dL (ref 8.5–10.2)
CHLORIDE: 99 mmol/L (ref 96–111)
CHLORIDE: 96 mmol/L (ref 96–111)
CO2 TOTAL: 30 mmol/L (ref 22–32)
CO2 TOTAL: 29 mmol/L (ref 22–32)
CREATININE: 1.97 mg/dL — ABNORMAL HIGH (ref 0.62–1.27)
CREATININE: 1.97 mg/dL — ABNORMAL HIGH (ref 0.62–1.27)
CREATININE: 2.19 mg/dL — ABNORMAL HIGH (ref 0.62–1.27)
ESTIMATED GFR: 33 mL/min/{1.73_m2} — ABNORMAL LOW (ref >59)
ESTIMATED GFR: 29 mL/min/{1.73_m2} — ABNORMAL LOW (ref >59)
GLUCOSE: 98 mg/dL (ref 65–139)
GLUCOSE: 220 mg/dL — ABNORMAL HIGH (ref 65–139)
GLUCOSE: 220 mg/dL — ABNORMAL HIGH (ref 65–139)
POTASSIUM: 3.7 mmol/L (ref 3.5–5.1)
POTASSIUM: 3.8 mmol/L (ref 3.5–5.1)
SODIUM: 141 mmol/L (ref 136–145)
SODIUM: 137 mmol/L (ref 136–145)

## 2017-05-23 LAB — POC BLOOD GLUCOSE (RESULTS)
GLUCOSE, POC: 168 mg/dL — ABNORMAL HIGH (ref 70–105)
GLUCOSE, POC: 88 mg/dL (ref 70–105)
GLUCOSE, POC: 127 mg/dL — ABNORMAL HIGH (ref 70–105)
GLUCOSE, POC: 154 mg/dL — ABNORMAL HIGH (ref 70–105)

## 2017-05-23 MED ORDER — ISOSORBIDE MONONITRATE ER 30 MG TABLET,EXTENDED RELEASE 24 HR
30.0000 mg | ORAL_TABLET | Freq: Every day | ORAL | Status: DC
Start: 2017-05-23 — End: 2017-05-27
  Administered 2017-05-23 – 2017-05-27 (×5): 30 mg via ORAL
  Filled 2017-05-23 (×5): qty 1

## 2017-05-23 MED ORDER — HYDRALAZINE 25 MG TABLET
25.0000 mg | ORAL_TABLET | Freq: Three times a day (TID) | ORAL | Status: DC
Start: 2017-05-23 — End: 2017-05-25
  Administered 2017-05-23 – 2017-05-24 (×4): 25 mg via ORAL
  Administered 2017-05-24: 0 mg via ORAL
  Administered 2017-05-25: 25 mg via ORAL
  Filled 2017-05-23 (×9): qty 1

## 2017-05-23 MED ORDER — INSULIN GLARGINE (U-100) 100 UNIT/ML SUBCUTANEOUS SOLUTION
10.0000 [IU] | Freq: Every evening | SUBCUTANEOUS | Status: DC
Start: 2017-05-23 — End: 2017-05-24
  Administered 2017-05-23: 10 [IU] via SUBCUTANEOUS
  Filled 2017-05-23: qty 10

## 2017-05-23 MED ADMIN — lactated Ringers intravenous solution: SUBCUTANEOUS | @ 21:00:00 | NDC 00338011704

## 2017-05-23 NOTE — Care Plan (Signed)
Problem: Patient Care Overview (Adult,OB)  Goal: Plan of Care Review(Adult,OB)  The patient and/or their representative will communicate an understanding of their plan of care   Outcome: Ongoing (see interventions/notes)  Pt resting comfortably with no complaints of chest pain or SOB. Pt has had various runs of asymptomatic v-tach, service aware. Continuing diuresis with IV lasix. Pt able to answer orientation questions, but appears confused otherwise. Remains of video monitoring due to impulsivity. Other fall precautions maintained. VSS. Frequent weight shift encouraged.   Goal: Individualization/Patient Specific Goal(Adult/OB)  Outcome: Ongoing (see interventions/notes)    Goal: Interdisciplinary Rounds/Family Conf  Outcome: Ongoing (see interventions/notes)      Problem: Cardiac: ACS (Acute Coronary Syndrome) (Adult)  Prevent and manage potential problems including:  1. cardiovascular structural defects  2. chest pain (angina)  3. dysrhythmia/arrhythmia  4. embolism  5. heart failure/shock  6. ischemia leading to infarction  7. pericarditis  8. situational response   Goal: Signs and Symptoms of Listed Potential Problems Will be Absent, Minimized or Managed (Cardiac: ACS)  Signs and symptoms of listed potential problems will be absent, minimized or managed by discharge/transition of care (reference Cardiac: ACS (Acute Coronary Syndrome) (Adult) CPG).   Outcome: Ongoing (see interventions/notes)      Problem: Fall Risk (Adult)  Goal: Absence of Falls  Patient will demonstrate the desired outcomes by discharge/transition of care.   Outcome: Ongoing (see interventions/notes)      Problem: Nutrition, Imbalanced: Inadequate Oral Intake (Adult)  Goal: Improved Oral Intake  Patient will demonstrate the desired outcomes by discharge/transition of care.   Outcome: Ongoing (see interventions/notes)    Goal: Prevent Further Weight Loss  Patient will demonstrate the desired outcomes by discharge/transition of care.   Outcome:  Ongoing (see interventions/notes)      Problem: Skin Injury Risk (Adult,Obstetrics,Pediatric)  Goal: Skin Health and Integrity  Patient will demonstrate the desired outcomes by discharge/transition of care.   Outcome: Ongoing (see interventions/notes)

## 2017-05-23 NOTE — Progress Notes (Signed)
[name]  Medicine Progress Note    [name] of service: 05/23/2017  [name] of Admission:  05/20/2017    [name] Day:  LOS: 3 days   Subjective:   Patient with asymptomatic 10 beat run of AIVR overnight, otherwise no acute events. This AM, patient denies any complaints.    Vital Signs:  Temp  Avg: 36.7 C (98 F)  Min: 36.4 C (97.6 F)  Max: 36.9 C (98.5 F)    Pulse  Avg: 76.7  Min: 74  Max: 81 BP  Min: 91/51  Max: 132/79   Resp  Avg: 16  Min: 16  Max: 16 SpO2  Avg: 94.2 %  Min: 92 %  Max: 97 %   Pain Score (Numeric, Faces): 0      Input/Output    Intake/Output Summary (Last 24 hours) at 05/23/17 0631  Last data filed at 05/23/17 0200   Gross per 24 hour   Intake              210 ml   Output             2890 ml   Net            -2680 ml    I/O last shift:  11/10 0000 - 11/10 0759  In: -   Out: 400 [Urine:400]       Current Facility-Administered Medications:  apixaban (ELIQUIS) tablet 5 mg Oral 2x/day   aspirin chewable tablet 81 mg 81 mg Oral Daily   cholecalciferol (VITAMIN D3) tablet 1,000 Units Oral Daily   donepezil (ARICEPT) tablet 5 mg Oral NIGHTLY   famotidine (PEPCID) tablet 20 mg Oral Daily   ferrous sulfate 324 mg (65 mg elemental IRON) tablet 324 mg Oral Daily before Breakfast   folic acid (FOLVITE) tablet 1 mg Oral Daily   furosemide (LASIX) 10 mg/mL injection 80 mg Intravenous Q8HRS   hydrALAZINE (APRESOLINE) tablet 10 mg Oral Q8HRS   insulin glargine (LANTUS) 100 units/mL injection 12 Units Subcutaneous NIGHTLY   isosorbide dinitrate (ISORDIL) tablet 10 mg Oral 3x/day AC   polyethylene glycol (MIRALAX) oral packet 17 g Oral Daily   sennosides-docusate sodium (SENOKOT-S) 8.6-50mg  per tablet 1 Tab Oral 2x/day   SSIP insulin lispro (HUMALOG) 100 units/mL injection 3-9 Units Subcutaneous 4x/day PRN       Physical Exam:  General: Appears stated age, in chronically poor health and in no acute distress.  Eyes: Conjunctiva clear, pupils equal and round.  HENT: ENT without  erythema or injection, mouth mucous membranes moist.  Neck: Supple, symmetrical, trachea midline.  Respiratory: Lungs clear to auscultation bilaterally  Cardiovascular: Regular rate and rhythm, no extra heart sounds appreciated  Abdomen: Soft, non-tender, non-distended, bowel sounds normal.  Musculoskeletals: 2+ pitting edema in BL lower extremities  Skin: Skin warm and dry  Neurologic: Grossly normal, Alert and oriented x3.  Psychiatric: Normal affect.      Labs:  Results for orders placed or performed during the [name] encounter of 05/20/17 (from the past 24 hour(s))   CBC/DIFF    Narrative    The following orders were created for panel order CBC/DIFF.  Procedure                               Abnormality         Status                     ---------                               -----------         ------  CBC WITH DIFF[[id number]]                Abnormal            Final result                 Please view results for these tests on the individual orders.   BASIC METABOLIC PANEL   Result Value Ref Range    SODIUM 141 136 - 145 mmol/L    POTASSIUM 3.7 3.5 - 5.1 mmol/L    CHLORIDE 99 96 - 111 mmol/L    CO2 TOTAL 30 22 - 32 mmol/L    ANION GAP 12 4 - 13 mmol/L    CALCIUM 10.2 8.5 - 10.2 mg/dL    GLUCOSE 98 65 - 161 mg/dL    BUN 28 (H) 8 - 25 mg/dL    CREATININE 0.96 (H) 0.62 - 1.27 mg/dL    BUN/CREA RATIO 14 6 - 22    ESTIMATED GFR 33 (L) >59 mL/min/1.45m^2   CBC WITH DIFF   Result Value Ref Range    WBC 8.0 3.5 - 11.0 x10^3/uL    RBC 4.79 4.06 - 5.63 x10^6/uL    HGB 14.5 12.5 - 16.3 g/dL    HCT 04.5 40.9 - 81.1 %    MCV 92.0 78.0 - 100.0 fL    MCH 30.3 27.4 - 33.0 pg    MCHC 32.9 32.5 - 35.8 g/dL    RDW 91.4 (H) 78.2 - 15.0 %    PLATELETS 183 140 - 450 x10^3/uL    MPV 8.8 7.5 - 11.5 fL    NEUTROPHIL % 67 %    LYMPHOCYTE % 17 %    MONOCYTE % 12 %    EOSINOPHIL % 3 %    BASOPHIL % 1 %    NEUTROPHIL # 5.36 1.50 - 7.70 x10^3/uL    LYMPHOCYTE # 1.37 1.00 - 4.80 x10^3/uL    MONOCYTE # 0.98 0.30 - 1.00  x10^3/uL    EOSINOPHIL # 0.21 0.00 - 0.50 x10^3/uL    BASOPHIL # 0.04 0.00 - 0.20 x10^3/uL   POC BLOOD GLUCOSE (RESULTS)   Result Value Ref Range    GLUCOSE, POC 102 70 - 105 mg/dL    Narrative    RN Notified   POC BLOOD GLUCOSE (RESULTS)   Result Value Ref Range    GLUCOSE, POC 140 (H) 70 - 105 mg/dL    Narrative    RN Notified   POC BLOOD GLUCOSE (RESULTS)   Result Value Ref Range    GLUCOSE, POC 139 (H) 70 - 105 mg/dL    Narrative    RN Notified       Radiology:  No new imaging.    PT/OT: Yes    Consults: Palliative    Hardware (lines, foley's, tubes): PIV    Assessment/ Plan:   Active [name] Problems    Diagnosis   . 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 Plan is for medical management of patient's CAD. Currently being diuresed with IV Lasix.    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 BiV-D, NYHA Class IV  CAD s/p PCI  Acute on Chronic Systolic Heart Failure   -Pt with multiple vessel disease on catheterizations however is an unsuitable candidate for  high-risk intervention given his comorbidities and functional  -Continue diuresis with IV Lasix 80 mg TID   -net negative ~4L in past 24 hours  -Strict I and O's  -Continue ASA 81 mg   -Increase hydralazine to 25 mg TID and continue isordil 10 mg TID  -Hold home losartan 100 mg QD, coreg 12.5 BID  -Palliative consulted for goal clarification: treatment limitations ordered    Atrial Fibrillation  -CHADSVASC 6, HAS BLED 5  -S/p BiV-D in 2015, AVN in 2017  -Home digoxin discontinued  -Anticoagulation with home Eliquis 5 mg BID    Dementia  -Fully oriented on exam  -Continue Aricept 10 mg QHS    IDDM2  -Home Lantus is 15 U QHS   -Continue conservatively at 5 U QHS  -conservative SSI  -Hold home metformin    Chronic Medical Conditions:  -Vitamin D def: continue 1000  U D3 QD  -Fe Deficiency Anemia: continue Fe replacement  -GERD: pepcid while inpatient       Coronary artery disease therapies:  - ASA: 81 mg daily  - P2Y12 inhibitor (Plavix, Brilinta, Effient): N/A  - Statin: allergic to statin  - Specific BB/ACE-i as below    Heart failure therapies:  - ACE-i/ARB: No due to elevated Cr  - Beta blocker: No due to low cardiac output  - Aldosterone antagonist: No: Why? Elevated Cr  - ICD: Yes  - CRT: Yes    Prophylaxis:                          DVT/PE:  Apixaban                          GI: H2 blocker  Diet and Nutrition: Cardiac diet  PT/OT: Ordered  Code status: No CPR    Disposition: Nursing facility      Sardar Marilynn Latino, MD        I saw and examined the patient.  I reviewed the resident's note.  I agree with the findings and plan of care as documented in the resident's note.  Any exceptions/additions are edited/noted.    John Giovanni, DO

## 2017-05-23 NOTE — Nurses Notes (Addendum)
Pt had 11 beat run of vtach. Service notified via text page. Awaiting call back and orders

## 2017-05-24 DIAGNOSIS — E876 Hypokalemia: Secondary | ICD-10-CM | POA: Diagnosis present

## 2017-05-24 LAB — BASIC METABOLIC PANEL
ANION GAP: 12 mmol/L (ref 4–13)
BUN/CREA RATIO: 12 (ref 6–22)
BUN: 26 mg/dL — ABNORMAL HIGH (ref 8–25)
CALCIUM: 9.5 mg/dL (ref 8.5–10.2)
CHLORIDE: 97 mmol/L (ref 96–111)
CO2 TOTAL: 29 mmol/L (ref 22–32)
CREATININE: 2.22 mg/dL — ABNORMAL HIGH (ref 0.62–1.27)
ESTIMATED GFR: 29 mL/min/1.73mˆ2 — ABNORMAL LOW (ref >59)
GLUCOSE: 198 mg/dL — ABNORMAL HIGH (ref 65–139)
POTASSIUM: 3.4 mmol/L — ABNORMAL LOW (ref 3.5–5.1)
POTASSIUM: 3.4 mmol/L — ABNORMAL LOW (ref 3.5–5.1)
SODIUM: 138 mmol/L (ref 136–145)

## 2017-05-24 LAB — POC BLOOD GLUCOSE (RESULTS)
GLUCOSE, POC: 258 mg/dL — ABNORMAL HIGH (ref 70–105)
GLUCOSE, POC: 146 mg/dL — ABNORMAL HIGH (ref 70–105)
GLUCOSE, POC: 124 mg/dL — ABNORMAL HIGH (ref 70–105)
GLUCOSE, POC: 212 mg/dL — ABNORMAL HIGH (ref 70–105)
GLUCOSE, POC: 177 mg/dL — ABNORMAL HIGH (ref 70–105)

## 2017-05-24 LAB — MAGNESIUM: MAGNESIUM: 2 mg/dL (ref 1.6–2.5)

## 2017-05-24 LAB — POTASSIUM
POTASSIUM: 3.5 mmol/L (ref 3.5–5.1)
POTASSIUM: 4 mmol/L (ref 3.5–5.1)
POTASSIUM: 4.2 mmol/L (ref 3.5–5.1)

## 2017-05-24 MED ORDER — POTASSIUM CHLORIDE ER 20 MEQ TABLET,EXTENDED RELEASE(PART/CRYST)
40.0000 meq | ORAL_TABLET | Freq: Once | ORAL | Status: AC
Start: 2017-05-24 — End: 2017-05-24
  Administered 2017-05-24: 40 meq via ORAL
  Filled 2017-05-24 (×2): qty 2

## 2017-05-24 MED ORDER — INSULIN GLARGINE (U-100) 100 UNIT/ML SUBCUTANEOUS SOLUTION
12.0000 [IU] | Freq: Every evening | SUBCUTANEOUS | Status: DC
Start: 2017-05-24 — End: 2017-05-27
  Administered 2017-05-24 – 2017-05-26 (×3): 12 [IU] via SUBCUTANEOUS
  Filled 2017-05-24 (×4): qty 12

## 2017-05-24 MED ORDER — POTASSIUM CHLORIDE ER 20 MEQ TABLET,EXTENDED RELEASE(PART/CRYST)
40.0000 meq | ORAL_TABLET | ORAL | Status: AC
Start: 2017-05-24 — End: 2017-05-24
  Administered 2017-05-24: 40 meq via ORAL
  Filled 2017-05-24: qty 2

## 2017-05-24 MED ORDER — POTASSIUM CHLORIDE ER 20 MEQ TABLET,EXTENDED RELEASE(PART/CRYST)
40.0000 meq | ORAL_TABLET | Freq: Once | ORAL | Status: AC
Start: 2017-05-24 — End: 2017-05-24
  Administered 2017-05-24: 40 meq via ORAL

## 2017-05-24 MED ORDER — AMIODARONE 200 MG TABLET
400.0000 mg | ORAL_TABLET | Freq: Two times a day (BID) | ORAL | Status: DC
Start: 2017-05-24 — End: 2017-05-27
  Administered 2017-05-24 – 2017-05-27 (×7): 400 mg via ORAL
  Filled 2017-05-24 (×8): qty 2

## 2017-05-24 MED ORDER — FUROSEMIDE 10 MG/ML INJECTION SOLUTION
20.0000 mg/h | INTRAMUSCULAR | Status: DC
Start: 2017-05-24 — End: 2017-05-25
  Administered 2017-05-24 (×7): 20 mg/h via INTRAVENOUS
  Administered 2017-05-24: 0 mg/h via INTRAVENOUS
  Administered 2017-05-24 (×7): 20 mg/h via INTRAVENOUS
  Administered 2017-05-25: 0 mg/h via INTRAVENOUS
  Administered 2017-05-25 (×11): 20 mg/h via INTRAVENOUS
  Filled 2017-05-24 (×4): qty 20

## 2017-05-24 MED ADMIN — potassium chloride ER 20 mEq tablet,extended release(part/cryst): ORAL | @ 15:00:00

## 2017-05-24 MED ADMIN — amiodarone 200 mg tablet: ORAL | @ 22:00:00

## 2017-05-24 MED ADMIN — NOREPINEPHRINE (VAR CONC) IN D5W OR NS 250ML INFUSION: INTRAVENOUS | @ 16:00:00

## 2017-05-24 NOTE — Care Plan (Signed)
Problem: Patient Care Overview (Adult,OB)  Goal: Individualization/Patient Specific Goal(Adult/OB)  Outcome: Ongoing (see interventions/notes)  Patient had thorough assessment completed, took all scheduled medications and had vital signs taken. Patient came in for heart failure and has been diuresis fluid off. Patient skin integrity remains the same and skin precautions intact. Patient remains fall free with precautions in place and video monitoring. Patient was educated on the importance of fluid restrictions, with heart failure. Patient is resting at this time.  Goal: Interdisciplinary Rounds/Family Conf  Outcome: Ongoing (see interventions/notes)      Problem: Cardiac: ACS (Acute Coronary Syndrome) (Adult)  Prevent and manage potential problems including:  1. cardiovascular structural defects  2. chest pain (angina)  3. dysrhythmia/arrhythmia  4. embolism  5. heart failure/shock  6. ischemia leading to infarction  7. pericarditis  8. situational response   Goal: Signs and Symptoms of Listed Potential Problems Will be Absent, Minimized or Managed (Cardiac: ACS)  Signs and symptoms of listed potential problems will be absent, minimized or managed by discharge/transition of care (reference Cardiac: ACS (Acute Coronary Syndrome) (Adult) CPG).   Outcome: Ongoing (see interventions/notes)      Problem: Fall Risk (Adult)  Goal: Absence of Falls  Patient will demonstrate the desired outcomes by discharge/transition of care.   Outcome: Ongoing (see interventions/notes)      Problem: Nutrition, Imbalanced: Inadequate Oral Intake (Adult)  Goal: Improved Oral Intake  Patient will demonstrate the desired outcomes by discharge/transition of care.   Outcome: Ongoing (see interventions/notes)    Goal: Prevent Further Weight Loss  Patient will demonstrate the desired outcomes by discharge/transition of care.   Outcome: Ongoing (see interventions/notes)      Problem: Skin Injury Risk (Adult,Obstetrics,Pediatric)  Goal: Skin Health  and Integrity  Patient will demonstrate the desired outcomes by discharge/transition of care.   Outcome: Ongoing (see interventions/notes)

## 2017-05-24 NOTE — Progress Notes (Signed)
[name]    [name]  Date of service: 05/24/2017  Date of Admission:  05/20/2017    Hospital Day:  LOS: 4 days   Subjective:   Patient with another asymptomatic run of AIVR overnight, otherwise no acute events. This AM, patient reports he is comfortable. Denies any lightheadedness, dizziness, palpitations or chest pain.    Vital Signs:  Temp  Avg: 36.9 C (98.4 F)  Min: 36.6 C (97.8 F)  Max: 37.2 C (98.9 F)    Pulse  Avg: 79.2  Min: 73  Max: 90 BP  Min: 96/63  Max: 114/99   Resp  Avg: 16  Min: 16  Max: 16 SpO2  Avg: 93.5 %  Min: 91 %  Max: 95 %   Pain Score (Numeric, Faces): 0      Input/Output    Intake/Output Summary (Last 24 hours) at 05/24/17 1155  Last data filed at 05/24/17 1144   Gross per 24 hour   Intake              680 ml   Output             1550 ml   Net             -870 ml    I/O last shift:  11/11 0800 - 11/11 1559  In: 500 [P.O.:490; I.V.:10]  Out: 300 [Urine:300]       Current Facility-Administered Medications:  amiodarone (CORDARONE) tablet 400 mg Oral 2x/day   apixaban (ELIQUIS) tablet 5 mg Oral 2x/day   aspirin chewable tablet 81 mg 81 mg Oral Daily   cholecalciferol (VITAMIN D3) tablet 1,000 Units Oral Daily   donepezil (ARICEPT) tablet 5 mg Oral NIGHTLY   famotidine (PEPCID) tablet 20 mg Oral Daily   ferrous sulfate 324 mg (65 mg elemental IRON) tablet 324 mg Oral Daily before Breakfast   folic acid (FOLVITE) tablet 1 mg Oral Daily   furosemide (LASIX) 200 mg in NS 50 mL (tot vol) infusion 20 mg/hr Intravenous Continuous   hydrALAZINE (APRESOLINE) tablet 25 mg Oral Q8HRS   insulin glargine (LANTUS) 100 units/mL injection 10 Units Subcutaneous NIGHTLY   isosorbide mononitrate (IMDUR) 24 hr extended release tablet 30 mg Oral Daily   polyethylene glycol (MIRALAX) oral packet 17 g Oral Daily   sennosides-docusate sodium (SENOKOT-S) 8.6-50mg  per tablet 1 Tab Oral 2x/day   SSIP insulin lispro (HUMALOG) 100 units/mL injection 3-9 Units Subcutaneous  4x/day PRN       Physical Exam:  General: Appears stated age, in chronically poor health and in no acute distress.  Eyes: Conjunctiva clear, pupils equal and round.  HENT: ENT without erythema or injection, mouth mucous membranes moist.  Neck: Supple, symmetrical, trachea midline.  Respiratory: Lungs clear to auscultation bilaterally  Cardiovascular: Regular rate and rhythm, no extra heart sounds appreciated  Abdomen: Soft, non-tender, non-distended, bowel sounds normal.  Musculoskeletals: 2+ pitting edema in BL lower extremities  Skin: Skin warm and dry  Neurologic: Grossly normal, Alert and oriented x3.  Psychiatric: Normal affect.      Labs:  Results for orders placed or performed during the hospital encounter of 05/20/17 (from the past 24 hour(s))   BASIC METABOLIC PANEL   Result Value Ref Range    SODIUM 137 136 - 145 mmol/L    POTASSIUM 3.8 3.5 - 5.1 mmol/L    CHLORIDE 96 96 - 111 mmol/L    CO2 TOTAL 29 22 - 32 mmol/L  ANION GAP 12 4 - 13 mmol/L    CALCIUM 9.7 8.5 - 10.2 mg/dL    GLUCOSE 914 (H) 65 - 139 mg/dL    BUN 28 (H) 8 - 25 mg/dL    CREATININE 7.82 (H) 0.62 - 1.27 mg/dL    BUN/CREA RATIO 13 6 - 22    ESTIMATED GFR 29 (L) >59 mL/min/1.76m^2   BASIC METABOLIC PANEL   Result Value Ref Range    SODIUM 138 136 - 145 mmol/L    POTASSIUM 3.4 (L) 3.5 - 5.1 mmol/L    CHLORIDE 97 96 - 111 mmol/L    CO2 TOTAL 29 22 - 32 mmol/L    ANION GAP 12 4 - 13 mmol/L    CALCIUM 9.5 8.5 - 10.2 mg/dL    GLUCOSE 956 (H) 65 - 139 mg/dL    BUN 26 (H) 8 - 25 mg/dL    CREATININE 2.13 (H) 0.62 - 1.27 mg/dL    BUN/CREA RATIO 12 6 - 22    ESTIMATED GFR 29 (L) >59 mL/min/1.83m^2   POTASSIUM   Result Value Ref Range    POTASSIUM 3.5 3.5 - 5.1 mmol/L   POC BLOOD GLUCOSE (RESULTS)   Result Value Ref Range    GLUCOSE, POC 127 (H) 70 - 105 mg/dL    Narrative    RN Notified   POC BLOOD GLUCOSE (RESULTS)   Result Value Ref Range    GLUCOSE, POC 154 (H) 70 - 105 mg/dL    Narrative    RN Notified   POC BLOOD GLUCOSE (RESULTS)   Result Value  Ref Range    GLUCOSE, POC 146 (H) 70 - 105 mg/dL    Narrative    RN Notified   POC BLOOD GLUCOSE (RESULTS)   Result Value Ref Range    GLUCOSE, POC 124 (H) 70 - 105 mg/dL    Narrative    RN Notified       Radiology:  No new imaging.    PT/OT: Yes    Consults: Palliative    Hardware (lines, foley's, tubes): PIV    Assessment/ Plan:   Active Hospital Problems    Diagnosis   . Hypokalemia   . 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 Plan is for medical management of patient's CAD. Currently being diuresed with IV Lasix.    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 BiV-D, NYHA Class IV  CAD s/p PCI  Acute on Chronic Systolic Heart Failure   -Pt with multiple vessel disease on catheterizations however is an unsuitable candidate for high-risk intervention given his comorbidities and functional  -Diuresis decreased yesterday, will start Lasix gtt   -net negative ~1L in past 24 hours with some unmeasurables noted  -Strict I and O's  -Continue ASA 81 mg   -Continue hydralazine to 25 mg TID and continue isordil 10 mg TID  -Hold home losartan 100 mg QD, coreg 12.5 BID  -Palliative consulted for goal clarification: treatment limitations ordered    Atrial Fibrillation  -CHADSVASC 6, HAS BLED 5  -S/p BiV-D in 2015, AVN in 2017  -Home digoxin discontinued  -Anticoagulation with home Eliquis 5 mg BID    Dementia  -Fully oriented on exam  -Continue Aricept 10 mg QHS    IDDM2  -Home Lantus  is 15 U QHS   -Continue conservatively at 5 U QHS  -conservative SSI  -Hold home metformin    Chronic Medical Conditions:  -Vitamin D def: continue 1000 U D3 QD  -Fe Deficiency Anemia: continue Fe replacement  -GERD: pepcid while inpatient       Coronary artery disease therapies:  - ASA: 81 mg daily  - P2Y12 inhibitor (Plavix, Brilinta, Effient):  N/A  - Statin: allergic to statin  - Specific BB/ACE-i as below    Heart failure therapies:  - ACE-i/ARB: No due to elevated Cr  - Beta blocker: No due to low cardiac output  - Aldosterone antagonist: No: Why? Elevated Cr  - ICD: Yes  - CRT: Yes    Prophylaxis:                          DVT/PE:  Apixaban                          GI: H2 blocker  Diet and Nutrition: Cardiac diet  PT/OT: Ordered  Code status: No CPR    Disposition: Nursing facility      Jim Griffes, MD        Late entry for 05/24/17. I saw and examined the patient.  I reviewed the resident's note.  I agree with the findings and plan of care as documented in the resident's note.  Any exceptions/additions are edited/noted.    John Giovanni, DO

## 2017-05-24 NOTE — Nurses Notes (Signed)
Patient had a short run of AIVR.

## 2017-05-24 NOTE — Nurses Notes (Signed)
Patient had 21 beat run of AIVR asymptomatic.

## 2017-05-24 NOTE — Nurses Notes (Signed)
05/24/17 1419   Vital Signs   BP (Non-Invasive) (!) 90/55   Holding 1400 dose of hydralazine per parameter. Service notified via text page.

## 2017-05-24 NOTE — Care Plan (Signed)
Problem: Patient Care Overview (Adult,OB)  Goal: Plan of Care Review(Adult,OB)  The patient and/or their representative will communicate an understanding of their plan of care   Outcome: Ongoing (see interventions/notes)  PT resting comfortably with no complaints of pain. Lasix drip started and maintained for diuresis. PT able to answer orientation questions, but otherwise acts confused and periodically attempts to get out of bed unassisted. Remains on video monitoring. Fall precautions maintained. VSS.   Goal: Individualization/Patient Specific Goal(Adult/OB)  Outcome: Ongoing (see interventions/notes)    Goal: Interdisciplinary Rounds/Family Conf  Outcome: Ongoing (see interventions/notes)      Problem: Cardiac: ACS (Acute Coronary Syndrome) (Adult)  Prevent and manage potential problems including:  1. cardiovascular structural defects  2. chest pain (angina)  3. dysrhythmia/arrhythmia  4. embolism  5. heart failure/shock  6. ischemia leading to infarction  7. pericarditis  8. situational response   Goal: Signs and Symptoms of Listed Potential Problems Will be Absent, Minimized or Managed (Cardiac: ACS)  Signs and symptoms of listed potential problems will be absent, minimized or managed by discharge/transition of care (reference Cardiac: ACS (Acute Coronary Syndrome) (Adult) CPG).   Outcome: Ongoing (see interventions/notes)      Problem: Fall Risk (Adult)  Goal: Absence of Falls  Patient will demonstrate the desired outcomes by discharge/transition of care.   Outcome: Ongoing (see interventions/notes)      Problem: Nutrition, Imbalanced: Inadequate Oral Intake (Adult)  Goal: Improved Oral Intake  Patient will demonstrate the desired outcomes by discharge/transition of care.   Outcome: Ongoing (see interventions/notes)    Goal: Prevent Further Weight Loss  Patient will demonstrate the desired outcomes by discharge/transition of care.   Outcome: Ongoing (see interventions/notes)      Problem: Skin Injury Risk  (Adult,Obstetrics,Pediatric)  Goal: Skin Health and Integrity  Patient will demonstrate the desired outcomes by discharge/transition of care.   Outcome: Ongoing (see interventions/notes)

## 2017-05-24 NOTE — Nurses Notes (Signed)
Received call from telemetry that pt had 26 beat run AIVR. Pt asymptomatic and asking for ginger ale. No complaints of chest pain. Service notified via text page. Awaiting response.

## 2017-05-25 LAB — BASIC METABOLIC PANEL
ANION GAP: 9 mmol/L (ref 4–13)
BUN/CREA RATIO: 11 (ref 6–22)
BUN: 24 mg/dL (ref 8–25)
CALCIUM: 10.4 mg/dL — ABNORMAL HIGH (ref 8.5–10.2)
CHLORIDE: 101 mmol/L (ref 96–111)
CO2 TOTAL: 30 mmol/L (ref 22–32)
CO2 TOTAL: 30 mmol/L (ref 22–32)
CREATININE: 2.24 mg/dL — ABNORMAL HIGH (ref 0.62–1.27)
ESTIMATED GFR: 29 mL/min/1.73mˆ2 — ABNORMAL LOW (ref >59)
GLUCOSE: 118 mg/dL (ref 65–139)
POTASSIUM: 4 mmol/L (ref 3.5–5.1)
SODIUM: 140 mmol/L (ref 136–145)

## 2017-05-25 LAB — POTASSIUM: POTASSIUM: 4 mmol/L (ref 3.5–5.1)

## 2017-05-25 LAB — POC BLOOD GLUCOSE (RESULTS)
GLUCOSE, POC: 104 mg/dL (ref 70–105)
GLUCOSE, POC: 137 mg/dL — ABNORMAL HIGH (ref 70–105)
GLUCOSE, POC: 197 mg/dL — ABNORMAL HIGH (ref 70–105)
GLUCOSE, POC: 129 mg/dL — ABNORMAL HIGH (ref 70–105)

## 2017-05-25 MED ORDER — HYDRALAZINE 10 MG TABLET
10.0000 mg | ORAL_TABLET | Freq: Three times a day (TID) | ORAL | Status: DC
Start: 2017-05-25 — End: 2017-05-27
  Administered 2017-05-25 – 2017-05-27 (×6): 10 mg via ORAL
  Filled 2017-05-25 (×9): qty 1

## 2017-05-25 MED ORDER — TORSEMIDE 20 MG TABLET
40.00 mg | ORAL_TABLET | Freq: Two times a day (BID) | ORAL | Status: DC
Start: 2017-05-25 — End: 2017-05-27
  Administered 2017-05-25 – 2017-05-27 (×4): 40 mg via ORAL
  Filled 2017-05-25 (×5): qty 2

## 2017-05-25 MED ADMIN — sodium chloride 0.9 % intravenous solution: ORAL | @ 09:00:00 | NDC 00338004904

## 2017-05-25 MED ADMIN — Medication: INTRAVENOUS | @ 02:00:00

## 2017-05-25 MED ADMIN — oxyCODONE-acetaminophen 5 mg-325 mg tablet: ORAL | @ 09:00:00

## 2017-05-25 MED ADMIN — lidocaine 4 %-menthoL 1 % topical patch: ORAL | @ 20:00:00

## 2017-05-25 MED ADMIN — lactated Ringers intravenous solution: INTRAVENOUS | @ 05:00:00 | NDC 00264775000

## 2017-05-25 NOTE — Care Plan (Addendum)
[facility]    [facility]  Medical Nutrition Therapy Screen Note                                      Date of Service: 05/25/2017    Reason for Note: Nutrition Screen Follow-Up    Reviewed patient status, diet order/TF/TPN, labs and medications.  Height: 182.9 cm (6')   Weight: 74.3 kg (163 lb 12.8 oz) (05/25/17 0100)   Body mass index is 22.22 kg/(m^2).    Brief Subjective:   Visited with pt who reports that he tries to eat 3 meals per day, but may only eat 50% of them. He endorses feeling "sick at his stomach with diarrhea" at times, but he states this is unrelated to food. He denies food allergies. He dislikes Ensure, but is agreeable to orange Magic Cup to promote intake. Per chart, intake has been 50% of meals. Encouraged pt to try his Magic Cup supplement.     Most Recent Screen Previous screen   Impaired Nutrition Status Score Impaired Nutrition Status Score: 1 - Food intake below 50 - 75% of normal requirement in preceding week - Mild (05/25/17 1400)   Impaired Nutrition Status Score: 1 - Wt. loss >5% in 3 months - Mild (05/20/17 1400)           Severity of Disease Score Severity of Disease Score: 1 - Chronic patients in particular with acute complications: cirrhosis, COPD, dialysis, oncology, recent bariatric surgery - Mild (05/25/17 1400)   Severity of Disease Score: 1 - Chronic patients in particular with acute complications: cirrhosis, COPD, dialysis, oncology, recent bariatric surgery - Mild (05/20/17 1400)       Age Age: 76 - > 70 years (05/25/17 1400) Age: 76 - > 70 years (05/20/17 1400)     Score Score: 3 (05/25/17 1400)     Score: 3 (05/20/17 1400)   Risk Level Risk Level: Level 3 - 3 pts - Screen note required. Follow-up within 5 days (05/25/17 1400)   Risk Level: Level 3 - 3 pts - Screen note required. Follow-up within 5 days (05/20/17 1400)           Nutrition related problems: inadequate intake related to early satiety    Assessment: No assessment at this time     Monitor: Tolerance of diet    Plan/Intervention: Add supplement orange Magic Cup BID   Check weight daily  Will continue to monitor per screening protocol  Encourage intake of meals and supplements.     Alen Bleacher, RDLD   [phone]        Problem: Patient Care Overview (Adult,OB)  Goal: Plan of Care Review(Adult,OB)  The patient and/or their representative will communicate an understanding of their plan of care   Outcome: Ongoing (see interventions/notes)      Problem: Nutrition, Imbalanced: Inadequate Oral Intake (Adult)  Goal: Improved Oral Intake  Patient will demonstrate the desired outcomes by discharge/transition of care.   Outcome: Ongoing (see interventions/notes)    Goal: Prevent Further Weight Loss  Patient will demonstrate the desired outcomes by discharge/transition of care.   Outcome: Ongoing (see interventions/notes)

## 2017-05-25 NOTE — Progress Notes (Signed)
[name]  Medicine Progress Note    [name]  Date of service: 05/25/2017  Date of Admission:  05/20/2017    [name] Day:  LOS: 5 days   Subjective:   No acute events overnight. Reports he is comfortable. Denies any SOB. Denies any lightheadedness, dizziness, palpitations or chest pain.    Vital Signs:  Temp  Avg: 36.6 C (97.8 F)  Min: 36.3 C (97.4 F)  Max: 36.9 C (98.5 F)    Pulse  Avg: 75.6  Min: 72  Max: 81 BP  Min: 90/55  Max: 132/87   Resp  Avg: 16.7  Min: 16  Max: 18 SpO2  Avg: 94 %  Min: 91 %  Max: 97 %   Pain Score (Numeric, Faces): 3      Input/Output    Intake/Output Summary (Last 24 hours) at 05/25/17 1129  Last data filed at 05/25/17 1000   Gross per 24 hour   Intake              590 ml   Output             1850 ml   Net            -1260 ml    I/O last shift:  11/12 0800 - 11/12 1559  In: 375 [P.O.:360; I.V.:15]  Out: 50 [Urine:50]       Current Facility-Administered Medications:  amiodarone (CORDARONE) tablet 400 mg Oral 2x/day   apixaban (ELIQUIS) tablet 5 mg Oral 2x/day   aspirin chewable tablet 81 mg 81 mg Oral Daily   cholecalciferol (VITAMIN D3) tablet 1,000 Units Oral Daily   donepezil (ARICEPT) tablet 5 mg Oral NIGHTLY   famotidine (PEPCID) tablet 20 mg Oral Daily   ferrous sulfate 324 mg (65 mg elemental IRON) tablet 324 mg Oral Daily before Breakfast   folic acid (FOLVITE) tablet 1 mg Oral Daily   furosemide (LASIX) 200 mg in NS 50 mL (tot vol) infusion 20 mg/hr Intravenous Continuous   hydrALAZINE (APRESOLINE) tablet 25 mg Oral Q8HRS   insulin glargine (LANTUS) 100 units/mL injection 12 Units Subcutaneous NIGHTLY   isosorbide mononitrate (IMDUR) 24 hr extended release tablet 30 mg Oral Daily   polyethylene glycol (MIRALAX) oral packet 17 g Oral Daily   sennosides-docusate sodium (SENOKOT-S) 8.6-50mg  per tablet 1 Tab Oral 2x/day   SSIP insulin lispro (HUMALOG) 100 units/mL injection 3-9 Units Subcutaneous 4x/day PRN       Physical Exam:  General: Appears stated  age, in chronically poor health and in no acute distress.  Eyes: Conjunctiva clear, pupils equal and round.  HENT: ENT without erythema or injection, mouth mucous membranes moist.  Neck: Supple, symmetrical, trachea midline. No JVD.  Respiratory: Lungs clear to auscultation bilaterally  Cardiovascular: Regular rate and rhythm, no extra heart sounds appreciated  Abdomen: Soft, non-tender, non-distended, bowel sounds normal.  Musculoskeletals: No edema in BL lower extremities  Skin: Skin warm and dry  Neurologic: Grossly normal, Alert and oriented x3.  Psychiatric: Normal affect.      Labs:  Results for orders placed or performed during the [name] encounter of 05/20/17 (from the past 24 hour(s))   POTASSIUM   Result Value Ref Range    POTASSIUM 4.0 3.5 - 5.1 mmol/L   POTASSIUM   Result Value Ref Range    POTASSIUM 4.2 3.5 - 5.1 mmol/L   POTASSIUM   Result Value Ref Range    POTASSIUM 4.0 3.5 - 5.1 mmol/L   BASIC  METABOLIC PANEL   Result Value Ref Range    SODIUM 140 136 - 145 mmol/L    POTASSIUM 4.0 3.5 - 5.1 mmol/L    CHLORIDE 101 96 - 111 mmol/L    CO2 TOTAL 30 22 - 32 mmol/L    ANION GAP 9 4 - 13 mmol/L    CALCIUM 10.4 (H) 8.5 - 10.2 mg/dL    GLUCOSE 244 65 - 010 mg/dL    BUN 24 8 - 25 mg/dL    CREATININE 2.72 (H) 0.62 - 1.27 mg/dL    BUN/CREA RATIO 11 6 - 22    ESTIMATED GFR 29 (L) >59 mL/min/1.62m2   POC BLOOD GLUCOSE (RESULTS)   Result Value Ref Range    GLUCOSE, POC 212 (H) 70 - 105 mg/dL    Narrative    RN Notified   POC BLOOD GLUCOSE (RESULTS)   Result Value Ref Range    GLUCOSE, POC 177 (H) 70 - 105 mg/dL    Narrative    RN Notified   POC BLOOD GLUCOSE (RESULTS)   Result Value Ref Range    GLUCOSE, POC 104 70 - 105 mg/dL    Narrative    RN Notified   POC BLOOD GLUCOSE (RESULTS)   Result Value Ref Range    GLUCOSE, POC 137 (H) 70 - 105 mg/dL    Narrative    RN Notified       Radiology:  No new imaging.    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 Plan is for medical management of patient's CAD. Was diuresed with IV Lasix now with PO torsemide.    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 BiV-D, NYHA Class IV  CAD s/p PCI  Acute on Chronic Systolic Heart Failure   -Pt with multiple vessel disease on catheterizations however is an unsuitable candidate for high-risk intervention given his comorbidities and functional  -DC Lasix gtt; start torsemide 40 mg BID   -net negative ~1.6L in past 24 hours  -Strict I and O's  -Continue ASA 81 mg   -Decrease hydralazine to 10 mg TID and continue isordil 10 mg TID  -Hold home losartan 100 mg QD, coreg 12.5 BID  -Palliative consulted for goal clarification: treatment limitations ordered    Atrial Fibrillation  -CHADSVASC 6, HAS BLED 5  -S/p BiV-D in 2015, AVN in 2017  -Home digoxin discontinued  -Anticoagulation with home Eliquis 5 mg BID    Dementia  -Fully oriented on exam  -Continue Aricept 10 mg QHS    IDDM2  -Home Lantus is 15 U QHS   -Continue conservatively at 5 U QHS  -conservative SSI  -Hold home metformin    Chronic Medical Conditions:  -Vitamin D def: continue 1000 U D3 QD  -Fe Deficiency Anemia: continue Fe replacement  -GERD: pepcid while inpatient       Coronary artery disease therapies:  - ASA: 81 mg daily  - P2Y12 inhibitor (Plavix, Brilinta, Effient): N/A  - Statin: allergic to statin  - Specific BB/ACE-i as below    Heart  failure therapies:  - ACE-i/ARB: No due to elevated Cr  - Beta blocker: No due to low cardiac output  - Aldosterone antagonist: No: Why? Elevated Cr  - ICD: Yes  - CRT: Yes    Prophylaxis:                          DVT/PE:  Apixaban                          GI: H2 blocker  Diet and  Nutrition: Cardiac diet  PT/OT: Ordered  Code status: No CPR    Disposition: Nursing facility      Verneita Griffes, MD        Late entry for 05/25/17. I saw and examined the patient.  I reviewed the resident's note.  I agree with the findings and plan of care as documented in the resident's note.  Any exceptions/additions are edited/noted.    Andrey Spearman Mohmmad Saleeby, DO

## 2017-05-25 NOTE — Care Plan (Signed)
Problem: Patient Care Overview (Adult,OB)  Goal: Plan of Care Review(Adult,OB)  The patient and/or their representative will communicate an understanding of their plan of care   Outcome: Ongoing (see interventions/notes)  PT resting comfortably with no complaints of pain or SOB. Continuing diuresis. IV lasix D/C and PO demadex started. Pt able to answer orientation questions but still appears confused. Tentative plan for discharge Wednesday or Thursday to SNF. VSS. Pt remains on video monitoring due to intermittent confusion and episodes of trying to get out of bed without assistance. Frequent weight shift encouraged. Fall precautions maintained.   Goal: Individualization/Patient Specific Goal(Adult/OB)  Outcome: Ongoing (see interventions/notes)    Goal: Interdisciplinary Rounds/Family Conf  Outcome: Ongoing (see interventions/notes)      Problem: Cardiac: ACS (Acute Coronary Syndrome) (Adult)  Prevent and manage potential problems including:  1. cardiovascular structural defects  2. chest pain (angina)  3. dysrhythmia/arrhythmia  4. embolism  5. heart failure/shock  6. ischemia leading to infarction  7. pericarditis  8. situational response   Goal: Signs and Symptoms of Listed Potential Problems Will be Absent, Minimized or Managed (Cardiac: ACS)  Signs and symptoms of listed potential problems will be absent, minimized or managed by discharge/transition of care (reference Cardiac: ACS (Acute Coronary Syndrome) (Adult) CPG).   Outcome: Ongoing (see interventions/notes)      Problem: Fall Risk (Adult)  Goal: Absence of Falls  Patient will demonstrate the desired outcomes by discharge/transition of care.   Outcome: Ongoing (see interventions/notes)      Problem: Nutrition, Imbalanced: Inadequate Oral Intake (Adult)  Goal: Improved Oral Intake  Patient will demonstrate the desired outcomes by discharge/transition of care.   Outcome: Ongoing (see interventions/notes)    Goal: Prevent Further Weight Loss  Patient will  demonstrate the desired outcomes by discharge/transition of care.   Outcome: Ongoing (see interventions/notes)      Problem: Skin Injury Risk (Adult,Obstetrics,Pediatric)  Goal: Skin Health and Integrity  Patient will demonstrate the desired outcomes by discharge/transition of care.   Outcome: Ongoing (see interventions/notes)

## 2017-05-25 NOTE — Care Plan (Signed)
[facility]  Rehabilitation Services  Occupational Therapy Progress Note    Patient Name: [name]  Date of Birth:   Height:  182.9 cm (6')  Weight:  74.3 kg (163 lb 12.8 oz)  Room/Bed: 18/A  Payor: MEDICARE / Plan: MEDICARE PART A AND B / Product Type: Medicare /     Assessment:    Pt with fair tolerance to OT treatment session this date. Pt initially reluctant to participate though agreeable with encouragement and education. Completed functional supine to sit transfer to EOB with CGA for steadying. Completed LB dressing tasks while seated/standing EOB with mod A for BLE management; able to secure pants around waist once standing with CGA for balance. Following several seconds of standing, pt reported dizziness and returned to seated position. Dizziness remained persistent despite seated rest break; BP WFL. Returned to supine with CGA. From OT standpoint, continue to anticipate return to SNF upon d/c once medically appropriate. OT will continue to follow.      Discharge Needs:   Equipment Recommendation: none anticipated    Discharge Disposition:  skilled nursing facility     JUSTIFICATION OF DISCHARGE RECOMMENDATION   Based on current diagnosis, functional performance prior to admission, and current functional performance, this patient requires continued OT services in skilled nursing facility in order to achieve significant functional improvements.    Plan:   Continue to follow patient according to established plan of care.  The risks/benefits of therapy have been discussed with the patient/caregiver and he/she is in agreement with the established plan of care.     Subjective & Objective:        05/25/17 1128   Therapist Pager   OT Assigned/ Pager # Jewelianna Pancoast [phone]   Rehab Session   Document Type therapy note (daily note)   Total OT Minutes: 20   Patient Effort adequate   Symptoms Noted During/After Treatment dizziness   General Information   Patient Profile Reviewed? yes   Onset of  Illness/Injury or Date of Surgery 05/20/17   Pertinent History of Current Functional Problem [name] is a 76 y.o. 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 Plan is for medical management of patient's CAD. Currently being diuresed with IV Lasix.   Medical Lines PIV Line;Telemetry   Respiratory Status room air   Existing Precautions/Restrictions DNR;fall precautions   Pre Treatment Status   Pre Treatment Patient Status Patient supine in bed;Call light within reach;Telephone within reach;Sitter select activated;Nurse approved session   Support Present Pre Treatment  None   Communication Pre Treatment  Nurse   Mutuality/Individual Preferences   What Information Would Help [place] Give You More Personalized Care? OOB with FWW and assist x1   Vital Signs   Pre-Treatment Heart Rate (beats/min) 81   Post-treatment Heart Rate (beats/min) 72   Pre SpO2 (%) 94   O2 Delivery Pre Treatment room air   Post SpO2 (%) 97   O2 Delivery Post Treatment room air   Vitals Comment VSS t/o   Pain Assessment   Pain Scale: Numbers, Pretreatment 0/10 - no pain   Pain Scale: Numbers, Post-Treatment 0/10 - no pain   Pre/Post Treatment Pain Comment Denies any pain throughout session.   Coping/Psychosocial   Observed Emotional State accepting;calm;cooperative   Verbalized Emotional State acceptance   Coping/Psychosocial Response Interventions   Plan Of Care Reviewed With patient  Cognitive Assessment/Interventions   Behavior/Mood Observations alert;behavior appropriate to situation, WNL/WFL;cooperative   Orientation Status  oriented x 4   Attention WNL/WFL   Follows Commands  follows multi-step commands;verbal cues/prompting required   Mobility Assessment/Training   Mobility Comment Mobility not appropriate to assess secondary to persistent dizziness that did not improve with seated rest break.   Bed Mobility  Assessment/Treatment   Supine-Sit Independence contact guard assist   Sit to Supine, Independence contact guard assist   Safety Issues impaired trunk control for bed mobility   Impairments coordination impaired;endurance;strength decreased   Comment  Functional bed mobility with CGA for steadying.  Able to scoot hips towards EOB without assistance.    Transfer Assessment/Treatment   Sit-Stand Independence  contact guard assist;verbal cues required   Stand-Sit Independence  contact guard assist   Sit-Stand-Sit, Assist Device front wheeled walker   Transfer Comment Functional STS transfer from EOB with use of FWW and CGA for steadying. Following LB dressing task, pt reported feeling dizzy and returned to seated position. Pt with persistent dizziness that did not resolve with seated rest break. BP WFL.   ADL Assessment/Intervention   ADL Comments LB dressing   Lower Body Dressing Assessment/Training   Position sitting;standing   Independence Level  moderate assist (50% patient effort)   Impairments activity tolerance impaired;balance impaired;strength decreased   Comment Completed donning of hospital pants while seated/standing EOB with mod A for BLE management.   Motor Skills/Interventions   Additional Documentation Functional Endurance (Group)   Manufacturing engineerBalance Skill Training   Comment with use of FWW   Sitting Balance: Static good balance   Sitting, Dynamic (Balance) fair + balance   Sit-to-Stand Balance fair balance   Standing Balance: Static fair balance   Systems Impairment Contributing to Balance Disturbance musculoskeletal   Identified Impairments Contributing to Balance Disturbance decreased strength   Functional Endurance Training   Comment, Functional Endurance limited this date d/t dizziness   Post Treatment Status   Post Treatment Patient Status Patient supine in bed;Call light within reach;Telephone within reach;Sitter select activated   Support Present Post Treatment  None   Occupational Therapy Clinical  Impression   Functional Level at Time of Session Pt with fair tolerance to OT treatment session this date. Pt initially reluctant to participate though agreeable with encouragement and education. Completed functional supine to sit transfer to EOB with CGA for steadying. Completed LB dressing tasks while seated/standing EOB with mod A for BLE management; able to secure pants around waist once standing with CGA for balance. Following several seconds of standing, pt reported dizziness and returned to seated position. Dizziness remained persistent despite seated rest break; BP WFL. Returned to supine with CGA. From OT standpoint, continue to anticipate return to SNF upon d/c once medically appropriate. OT will continue to follow.   Anticipated Equipment Needs at Discharge none anticipated   Anticipated Discharge Disposition skilled nursing facility       Therapist:   Gypsy Balsamiernie Stewart, MOT, OTR/L  Pager #: [phone]

## 2017-05-25 NOTE — Care Plan (Signed)
Problem: Patient Care Overview (Adult,OB)  Goal: Plan of Care Review(Adult,OB)  The patient and/or their representative will communicate an understanding of their plan of care   Outcome: Ongoing (see interventions/notes)  Patient rested well throughout the night. Patient denied chest pain, SOB and all other pain. Lasix gtt continued. Fall and skin precautions maintained. Video Monitoring in place. Frequent repositioning encouraged. Sitter select active and audible. Ambulated in room today with assist of 1 and a walker. Instructed to call for assistance when ambulating. Call bell within reach. VSS. Emotional support provided to patient.      Problem: Cardiac: ACS (Acute Coronary Syndrome) (Adult)  Prevent and manage potential problems including:  1. cardiovascular structural defects  2. chest pain (angina)  3. dysrhythmia/arrhythmia  4. embolism  5. heart failure/shock  6. ischemia leading to infarction  7. pericarditis  8. situational response   Goal: Signs and Symptoms of Listed Potential Problems Will be Absent, Minimized or Managed (Cardiac: ACS)  Signs and symptoms of listed potential problems will be absent, minimized or managed by discharge/transition of care (reference Cardiac: ACS (Acute Coronary Syndrome) (Adult) CPG).   Outcome: Ongoing (see interventions/notes)      Problem: Fall Risk (Adult)  Goal: Absence of Falls  Patient will demonstrate the desired outcomes by discharge/transition of care.   Outcome: Ongoing (see interventions/notes)      Problem: Nutrition, Imbalanced: Inadequate Oral Intake (Adult)  Goal: Improved Oral Intake  Patient will demonstrate the desired outcomes by discharge/transition of care.   Outcome: Ongoing (see interventions/notes)    Goal: Prevent Further Weight Loss  Patient will demonstrate the desired outcomes by discharge/transition of care.   Outcome: Ongoing (see interventions/notes)      Problem: Skin Injury Risk (Adult,Obstetrics,Pediatric)  Goal: Skin Health and Integrity   Patient will demonstrate the desired outcomes by discharge/transition of care.   Outcome: Ongoing (see interventions/notes)

## 2017-05-25 NOTE — Care Management Notes (Signed)
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 [I25.10]    Assessment:      05/25/17 1442   Assessment Details   Assessment Type Continued Assessment   Date of Care Management Update 05/25/17   Date of Next DCP Update 05/28/17   Care Management Plan   Discharge Planning Status plan in progress   Projected Discharge Date 05/27/17   Discharge Needs Assessment   Discharge Facility/Level Of Care Needs SNF Return (Medicare certified)(code 3)   Transportation Available other (see comments)  (Will need to evaluate once d/c date is confirmed)       Discharge Plan:  SNF Return (Medicare certified) (code 3)     Pt to return to [facility] upon d/c. Per TBR pt is still diuresing and will probably be d/c ready by mid-week. Pt will need evaluated on transportation when d/c date is confirmed.    The patient will continue to be evaluated for developing discharge needs.     Case Manager: Purcell Mouton  Phone: [phone]

## 2017-05-26 LAB — BASIC METABOLIC PANEL
ANION GAP: 14 mmol/L — ABNORMAL HIGH (ref 4–13)
BUN/CREA RATIO: 11 (ref 6–22)
BUN/CREA RATIO: 11 (ref 6–22)
BUN: 26 mg/dL — ABNORMAL HIGH (ref 8–25)
CALCIUM: 9.5 mg/dL (ref 8.5–10.2)
CHLORIDE: 97 mmol/L (ref 96–111)
CO2 TOTAL: 25 mmol/L (ref 22–32)
CREATININE: 2.46 mg/dL — ABNORMAL HIGH (ref 0.62–1.27)
ESTIMATED GFR: 26 mL/min/1.73mˆ2 — ABNORMAL LOW (ref >59)
GLUCOSE: 147 mg/dL — ABNORMAL HIGH (ref 65–139)
POTASSIUM: 4 mmol/L (ref 3.5–5.1)
SODIUM: 136 mmol/L (ref 136–145)

## 2017-05-26 LAB — CBC WITH DIFF
BASOPHIL #: 0.06 10*3/uL (ref 0.00–0.20)
BASOPHIL %: 1 %
EOSINOPHIL #: 0.19 x10ˆ3/uL (ref 0.00–0.50)
EOSINOPHIL %: 2 %
HCT: 47.5 % — ABNORMAL HIGH (ref 36.7–47.0)
HGB: 15.8 g/dL (ref 12.5–16.3)
LYMPHOCYTE #: 1.34 x10ˆ3/uL (ref 1.00–4.80)
LYMPHOCYTE %: 15 %
MCH: 30.6 pg (ref 27.4–33.0)
MCHC: 33.4 g/dL (ref 32.5–35.8)
MCV: 91.8 fL (ref 78.0–100.0)
MONOCYTE #: 1.11 x10ˆ3/uL — ABNORMAL HIGH (ref 0.30–1.00)
MONOCYTE %: 13 %
MPV: 8.6 fL (ref 7.5–11.5)
MPV: 8.6 fL (ref 7.5–11.5)
NEUTROPHIL #: 6.04 x10ˆ3/uL (ref 1.50–7.70)
NEUTROPHIL %: 69 %
PLATELETS: 200 x10ˆ3/uL (ref 140–450)
RBC: 5.17 x10ˆ6/uL (ref 4.06–5.63)
RDW: 16.1 % — ABNORMAL HIGH (ref 12.0–15.0)
WBC: 8.7 x10ˆ3/uL (ref 3.5–11.0)

## 2017-05-26 LAB — POC BLOOD GLUCOSE (RESULTS)
GLUCOSE, POC: 138 mg/dL — ABNORMAL HIGH (ref 70–105)
GLUCOSE, POC: 152 mg/dL — ABNORMAL HIGH (ref 70–105)
GLUCOSE, POC: 127 mg/dL — ABNORMAL HIGH (ref 70–105)

## 2017-05-26 LAB — MAGNESIUM: MAGNESIUM: 2.1 mg/dL (ref 1.6–2.5)

## 2017-05-26 LAB — PHOSPHORUS: PHOSPHORUS: 3.5 mg/dL (ref 2.3–4.0)

## 2017-05-26 MED ADMIN — torsemide 20 mg tablet: ORAL | @ 08:00:00

## 2017-05-26 NOTE — Care Plan (Addendum)
Problem: Patient Care Overview (Adult,OB)  Goal: Plan of Care Review(Adult,OB)  The patient and/or their representative will communicate an understanding of their plan of care   Outcome: Ongoing (see interventions/notes)    Patient did work with PT today, but refused getting OOB to chair for nursing. Patient able to answer orientation questions appropriately but is forgetful at times. Plan to DC tomorrow afternoon to [facility]. Patient's sister [name] updated on discharge plan for tomorrow afternoon, contact information placed in kardex. Patient encouraged to eat during meal times, different options provided however patient continues to have minimal intake. FR of 1800 maintained. Fall prevention maintained, repositioning facilitated. Video monitoring in place.    Goal: Individualization/Patient Specific Goal(Adult/OB)  Outcome: Ongoing (see interventions/notes)    Goal: Interdisciplinary Rounds/Family Conf  Outcome: Ongoing (see interventions/notes)      Problem: Cardiac: ACS (Acute Coronary Syndrome) (Adult)  Prevent and manage potential problems including:  1. cardiovascular structural defects  2. chest pain (angina)  3. dysrhythmia/arrhythmia  4. embolism  5. heart failure/shock  6. ischemia leading to infarction  7. pericarditis  8. situational response   Goal: Signs and Symptoms of Listed Potential Problems Will be Absent, Minimized or Managed (Cardiac: ACS)  Signs and symptoms of listed potential problems will be absent, minimized or managed by discharge/transition of care (reference Cardiac: ACS (Acute Coronary Syndrome) (Adult) CPG).   Outcome: Ongoing (see interventions/notes)      Problem: Fall Risk (Adult)  Goal: Absence of Falls  Patient will demonstrate the desired outcomes by discharge/transition of care.   Outcome: Ongoing (see interventions/notes)      Problem: Nutrition, Imbalanced: Inadequate Oral Intake (Adult)  Goal: Improved Oral Intake  Patient will demonstrate the desired outcomes by  discharge/transition of care.   Outcome: Ongoing (see interventions/notes)    Goal: Prevent Further Weight Loss  Patient will demonstrate the desired outcomes by discharge/transition of care.   Outcome: Ongoing (see interventions/notes)      Problem: Skin Injury Risk (Adult,Obstetrics,Pediatric)  Goal: Skin Health and Integrity  Patient will demonstrate the desired outcomes by discharge/transition of care.   Outcome: Ongoing (see interventions/notes)      Problem: Cardiac: Heart Failure (Adult)  Prevent and manage potential problems including:  1. cardiac cachexia  2. cardiac pump dysfunction  3. decreased quality of life  4. dysrhythmia/arrhythmia  5. fluid/electrolyte imbalance  6. functional decline/self-care deficit  7. respiratory compromise  8. situational response  9. sleep-disordered breathing   Goal: Signs and Symptoms of Listed Potential Problems Will be Absent, Minimized or Managed (Cardiac: Heart Failure)  Signs and symptoms of listed potential problems will be absent, minimized or managed by discharge/transition of care (reference Cardiac: Heart Failure (Adult) CPG).   Outcome: Ongoing (see interventions/notes)

## 2017-05-26 NOTE — Nurses Notes (Signed)
Patient continues to have PVCs but asymptomatic at this time and denies any chest pain, CCU resident on call notified and said  to continue monitoring this pt.

## 2017-05-26 NOTE — Nurses Notes (Signed)
Patient refused to have a new PIV placed, flushes well and WNL. Dr. Adline Mango paged and  An order to keep it today order to keep it today

## 2017-05-26 NOTE — Progress Notes (Signed)
[facility]  Medicine Progress Note    [name]  Date of service: 05/26/2017  Date of Admission:  05/20/2017    Hospital Day:  LOS: 6 days   Subjective:   No acute events overnight. Reports he is comfortable. Denies any SOB. Denies any lightheadedness, dizziness, palpitations or chest pain. Telemetry shows occasional PVCs. No VT.     Vital Signs:  Temp  Avg: 36.7 C (98 F)  Min: 36.3 C (97.4 F)  Max: 37.1 C (98.8 F)    Pulse  Avg: 75  Min: 73  Max: 79 BP  Min: 101/66  Max: 120/63   Resp  Avg: 16.3  Min: 14  Max: 18 SpO2  Avg: 94.3 %  Min: 91 %  Max: 97 %   Pain Score (Numeric, Faces): 0      Input/Output    Intake/Output Summary (Last 24 hours) at 05/26/17 0831  Last data filed at 05/26/17 0831   Gross per 24 hour   Intake              589 ml   Output              725 ml   Net             -136 ml    I/O last shift:  11/13 0800 - 11/13 1559  In: -   Out: 100 [Urine:100]       Current Facility-Administered Medications:  amiodarone (CORDARONE) tablet 400 mg Oral 2x/day   apixaban (ELIQUIS) tablet 5 mg Oral 2x/day   aspirin chewable tablet 81 mg 81 mg Oral Daily   cholecalciferol (VITAMIN D3) tablet 1,000 Units Oral Daily   donepezil (ARICEPT) tablet 5 mg Oral NIGHTLY   famotidine (PEPCID) tablet 20 mg Oral Daily   ferrous sulfate 324 mg (65 mg elemental IRON) tablet 324 mg Oral Daily before Breakfast   folic acid (FOLVITE) tablet 1 mg Oral Daily   hydrALAZINE (APRESOLINE) tablet 10 mg Oral Q8HRS   insulin glargine (LANTUS) 100 units/mL injection 12 Units Subcutaneous NIGHTLY   isosorbide mononitrate (IMDUR) 24 hr extended release tablet 30 mg Oral Daily   polyethylene glycol (MIRALAX) oral packet 17 g Oral Daily   sennosides-docusate sodium (SENOKOT-S) 8.6-50mg  per tablet 1 Tab Oral 2x/day   SSIP insulin lispro (HUMALOG) 100 units/mL injection 3-9 Units Subcutaneous 4x/day PRN   torsemide (DEMADEX) tablet 40 mg Oral 2x/day       Physical Exam:  General: Appears stated age, in chronically poor  health and in no acute distress.  Eyes: Conjunctiva clear, pupils equal and round.  HENT: ENT without erythema or injection, mouth mucous membranes moist.  Neck: Supple, symmetrical, trachea midline. No JVD.  Respiratory: Lungs clear to auscultation bilaterally  Cardiovascular: Regular rate and rhythm, no extra heart sounds appreciated  Abdomen: Soft, non-tender, non-distended, bowel sounds normal.  Musculoskeletals: No edema in BL lower extremities  Skin: Skin warm and dry  Neurologic: Grossly normal, Alert and oriented x3.  Psychiatric: Normal affect.      Labs:  Results for orders placed or performed during the hospital encounter of 05/20/17 (from the past 24 hour(s))   BASIC METABOLIC PANEL   Result Value Ref Range    SODIUM 136 136 - 145 mmol/L    POTASSIUM 4.0 3.5 - 5.1 mmol/L    CHLORIDE 97 96 - 111 mmol/L    CO2 TOTAL 25 22 - 32 mmol/L    ANION GAP 14 (H) 4 -  13 mmol/L    CALCIUM 9.5 8.5 - 10.2 mg/dL    GLUCOSE 161147 (H) 65 - 139 mg/dL    BUN 26 (H) 8 - 25 mg/dL    CREATININE 0.962.46 (H) 0.62 - 1.27 mg/dL    BUN/CREA RATIO 11 6 - 22    ESTIMATED GFR 26 (L) >59 mL/min/1.3965m^2    Narrative    Hemolysis can alter results at this level (slight).   MAGNESIUM   Result Value Ref Range    MAGNESIUM 2.1 1.6 - 2.5 mg/dL    Narrative    Hemolysis can alter results at this level (slight).   PHOSPHORUS   Result Value Ref Range    PHOSPHORUS 3.5 2.3 - 4.0 mg/dL    Narrative    Hemolysis can alter results at this level (slight).   CBC/DIFF    Narrative    The following orders were created for panel order CBC/DIFF.  Procedure                               Abnormality         Status                     ---------                               -----------         ------                     CBC WITH EAVW[[id number]]IFF[[id number]]                Abnormal            Final result                 Please view results for these tests on the individual orders.   CBC WITH DIFF   Result Value Ref Range    WBC 8.7 3.5 - 11.0 x10^3/uL    RBC 5.17 4.06 - 5.63  x10^6/uL    HGB 15.8 12.5 - 16.3 g/dL    HCT 82.947.5 (H) 56.236.7 - 47.0 %    MCV 91.8 78.0 - 100.0 fL    MCH 30.6 27.4 - 33.0 pg    MCHC 33.4 32.5 - 35.8 g/dL    RDW 13.016.1 (H) 86.512.0 - 15.0 %    PLATELETS 200 140 - 450 x10^3/uL    MPV 8.6 7.5 - 11.5 fL    NEUTROPHIL % 69 %    LYMPHOCYTE % 15 %    MONOCYTE % 13 %    EOSINOPHIL % 2 %    BASOPHIL % 1 %    NEUTROPHIL # 6.04 1.50 - 7.70 x10^3/uL    LYMPHOCYTE # 1.34 1.00 - 4.80 x10^3/uL    MONOCYTE # 1.11 (H) 0.30 - 1.00 x10^3/uL    EOSINOPHIL # 0.19 0.00 - 0.50 x10^3/uL    BASOPHIL # 0.06 0.00 - 0.20 x10^3/uL   POC BLOOD GLUCOSE (RESULTS)   Result Value Ref Range    GLUCOSE, POC 137 (H) 70 - 105 mg/dL    Narrative    RN Notified   POC BLOOD GLUCOSE (RESULTS)   Result Value Ref Range    GLUCOSE, POC 197 (H) 70 - 105 mg/dL    Narrative    RN Notified   POC BLOOD GLUCOSE (RESULTS)  Result Value Ref Range    GLUCOSE, POC 129 (H) 70 - 105 mg/dL    Narrative    RN Notified       Radiology:  No new imaging.    PT/OT: Yes    Consults: Palliative    Hardware (lines, foley's, tubes): PIV    Assessment/ Plan:   Active Hospital Problems    Diagnosis   . Hypokalemia   . 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 Plan is for medical management of patient's CAD. Was diuresed with IV Lasix now with PO torsemide.    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 BiV-D, NYHA Class IV  CAD s/p PCI  Acute on Chronic Systolic Heart Failure   -Pt with multiple vessel disease on catheterizations however is an unsuitable candidate for high-risk intervention given his comorbidities and functional status  -DC Lasix gtt; started torsemide 40 mg BID yesterday   -net negative ~391 ml in past 24 hours (total 8 L negative)  -Strict I and O's  -Continue ASA 81 mg   -hydralazine to 10 mg TID and continue imdur 30 mg daily   -Hold home losartan 100 mg QD, coreg 12.5 BID  -Palliative consulted for goal clarification: treatment limitations ordered    Atrial Fibrillation  -CHADSVASC 6, HAS BLED 5  -S/p BiV-D in 2015, AVN in 2017  -Home digoxin discontinued due to CKD, remains on PO amiodarone  -Anticoagulation with home Eliquis 5 mg BID    Dementia  -Fully oriented on exam  -Continue Aricept 10 mg QHS    IDDM2  -Home Lantus is 15 U QHS   -Continue conservatively at 5 U QHS  -conservative SSI  -Hold home metformin    Chronic Medical Conditions:  -Vitamin D def: continue 1000 U D3 QD  -Fe Deficiency Anemia: continue Fe replacement  -GERD: pepcid while inpatient       Coronary artery disease therapies:  - ASA: 81 mg daily  - P2Y12 inhibitor (Plavix, Brilinta, Effient): N/A  - Statin: allergic to statin  - Specific BB/ACE-i as below    Heart failure therapies:  - ACE-i/ARB: No due to elevated Cr  - Beta blocker: No due to low cardiac output  - Aldosterone antagonist: No: Why? Elevated Cr  - ICD: Yes  - CRT: Yes    Prophylaxis:                          DVT/PE:  Apixaban                          GI: H2 blocker  Diet and Nutrition: Cardiac diet  PT/OT: Ordered  Code status: No CPR    Disposition: Nursing facility likely tomorrow, unsafe with home transport so will need ambulance at discharge for transport to SNF      05/26/2017  I saw and examined the patient.  I reviewed the fellow's note.  I agree with the findings and plan of care as documented in the fellow's note.  Any exceptions/additions are edited/noted.    Andrey Spearman Donn Zanetti, DO

## 2017-05-26 NOTE — Care Plan (Signed)
Problem: Patient Care Overview (Adult,OB)  Goal: Plan of Care Review(Adult,OB)  The patient and/or their representative will communicate an understanding of their plan of care   Outcome: Ongoing (see interventions/notes)  Plan of care revived with the patient. Alert but disoriented to place, continues to have PVCs during this shift,  Pt. Sleeping comfortable and denies pain at this timecardiology resident on call this shift notified, He called back and ordered to monitor the patient.  Lasix IV was D/ced and Demadex started. Pt. At room air at upper 95%. Sitter select and video monitoring in place, urinal at bed side, FR at 1800    Problem: Fall Risk (Adult)  Goal: Absence of Falls  Patient will demonstrate the desired outcomes by discharge/transition of care.   Outcome: Ongoing (see interventions/notes)      Problem: Skin Injury Risk (Adult,Obstetrics,Pediatric)  Goal: Skin Health and Integrity  Patient will demonstrate the desired outcomes by discharge/transition of care.   Outcome: Ongoing (see interventions/notes)

## 2017-05-26 NOTE — Care Plan (Signed)
[facility]  Rehabilitation Services  Physical Therapy Progress Note      Patient Name: [name]  Date of Birth:   Height:  182.9 cm (6')  Weight:  74.8 kg (164 lb 14.5 oz)  Room/Bed: 18/A  Payor: MEDICARE / Plan: MEDICARE PART A AND B / Product Type: Medicare /     Assessment:     Pt presents to PT treatment session with decreased activity tolerance. Pt reported feeling weak and too unsteady to ambulate. Pt's balance deficits more notable than in previous sessions and CGA required for steadying when static standing. Pt able to perform 4 functional STS transfers in an attempt to improve BLE strength. however, pt became dizzy following this and dizziness did not resolve until pt was returned to supine in bed. Pt continues to be a good candidate for SNF d/c when medically appropriate so he may improve his mobility and safety.    Discharge Needs:   Equipment Recommendation: TBD    Discharge Disposition: skilled nursing facility    JUSTIFICATION OF DISCHARGE RECOMMENDATION   Based on current diagnosis, functional performance prior to admission, and current functional performance, this patient requires continued PT services in skilled nursing facility in order to achieve significant functional improvements in these deficit areas: aerobic capacity/endurance, gait, locomotion, and balance.      Plan:   Continue to follow patient according to established plan of care.  The risks/benefits of therapy have been discussed with the patient/caregiver and he/she is in agreement with the established plan of care.     Subjective & Objective:        05/26/17 1143   Therapist Pager   PT Assigned/ Pager # Sherrilyn Rist [phone]   Rehab Session   Document Type therapy note (daily note)   Total PT Minutes: 14   Patient Effort good   Symptoms Noted During/After Treatment dizziness   General Information   Patient Profile Reviewed? yes   Medical Lines PIV Line;Telemetry   Respiratory Status room air   Existing  Precautions/Restrictions DNR;fall precautions   Mutuality/Individual Preferences   What Information Would Help [place] Give You More Personalized Care? Raynelle Dick, FWW   Pre Treatment Status   Pre Treatment Patient Status Patient supine in bed;Call light within reach;Telephone within reach;Sitter select activated;Nurse approved session   Support Present Pre Treatment  None   Communication Pre Treatment  Nurse   Cognitive Assessment/Interventions   Behavior/Mood Observations behavior appropriate to situation, WNL/WFL   Orientation Status  oriented x 4   Attention WNL/WFL   Follows Commands  follows multi-step commands   Vital Signs   Pre-Treatment Heart Rate (beats/min) 77   Post-treatment Heart Rate (beats/min) 77   Pre SpO2 (%) 95   O2 Delivery Pre Treatment room air   Post SpO2 (%) 94   O2 Delivery Post Treatment room air   Vitals Comment VSS   Pain Assessment   Pain Scale: Numbers, Pretreatment 0/10 - no pain   Pain Scale: Numbers, Post-Treatment 0/10 - no pain   Bed Mobility Assessment/Treatment   Supine-Sit Independence minimum assist (75% patient effort);verbal cues required   Sit to Supine, Independence minimum assist (75% patient effort);verbal cues required   Safety Issues decreased use of legs for bridging/pushing;impaired trunk control for bed mobility   Impairments balance impaired;endurance;coordination impaired;strength decreased   Comment  Pt required increased assist for trunk management to come to sitting from supine. to return to bed pt's decreased strength caused him to require Min A  for LE management    Transfer Assessment/Treatment   Sit-Stand Independence  contact guard assist;minimum assist (75% patient effort)   Stand-Sit Independence  contact guard assist   Sit-Stand-Sit, Assist Device front wheeled walker   Transfer Safety Issues balance decreased during turns;step length decreased   Transfer Impairments balance impaired;coordination impaired;endurance;strength decreased   Transfer Comment Pt was  able to complete 4 functional STS transfers this session. Pt reported feeling too weak to stand initally, and required a seated rest. Attempted additional STS transfer, but pt again felt too unsteady to attempt gait. Pt able to perform 2 additional STS transfers to improve his strength as he required Min Ax1 for the first stand, but progressed to CGA for steadying and safety. Pt then became dizzy and was unable to perform additional OOB activity and returned to supine in bed with assist from PT   Gait Assessment/Treatment   Independence  unable to perform   Comment Pt reported feeling too weak and unsteady on his feet to perform gait this session. Pt with increased difficulty with transfers as well this session.   Balance Skill Training   Comment w/FWW use   Sitting Balance: Static good balance   Sitting, Dynamic (Balance) fair + balance   Sit-to-Stand Balance fair balance   Standing Balance: Static fair - balance   Standing Balance: Dynamic fair - balance   Systems Impairment Contributing to Balance Disturbance musculoskeletal   Identified Impairments Contributing to Balance Disturbance decreased strength   Post Treatment Status   Post Treatment Patient Status Patient supine in bed;Call light within reach;Telephone within reach;Sitter select activated   Support Present Post Treatment  None   Plan of Care Review   Plan Of Care Reviewed With patient   Physical Therapy Clinical Impression   Assessment Pt presents to PT treatment session with decreased activity tolerance. Pt reported feeling weak and too unsteady to ambulate. Pt's balance deficits more notable than in previous sessions and CGA required for steadying when static standing. Pt able to perform 4 functional STS transfers in an attempt to improve BLE strength. however, pt became dizzy following this and dizziness did not resolve until pt was returned to supine in bed. Pt continues to be a good candidate for SNF d/c when medically appropriate so he may  improve his mobility and safety.   Therapy Frequency minimum of 2x/week   Predicted Duration of Therapy Intervention (days/wks) until discharge   Anticipated Equipment Needs at Discharge (PT Clinical Impression) TBD   Anticipated Discharge Disposition  skilled nursing facility       Therapist:   Shiela MayerKari Sims, PT   Pager #: [phone]

## 2017-05-27 DIAGNOSIS — I5023 Acute on chronic systolic (congestive) heart failure: Secondary | ICD-10-CM

## 2017-05-27 LAB — CBC WITH DIFF
BASOPHIL #: 0.05 10*3/uL (ref 0.00–0.20)
BASOPHIL %: 1 %
EOSINOPHIL #: 0.17 x10ˆ3/uL (ref 0.00–0.50)
EOSINOPHIL %: 2 %
HCT: 48.1 % — ABNORMAL HIGH (ref 36.7–47.0)
HGB: 15.9 g/dL (ref 12.5–16.3)
LYMPHOCYTE #: 1.44 x10ˆ3/uL (ref 1.00–4.80)
LYMPHOCYTE %: 19 %
MCH: 29.7 pg (ref 27.4–33.0)
MCH: 29.7 pg (ref 27.4–33.0)
MCHC: 33 g/dL (ref 32.5–35.8)
MCV: 90.1 fL (ref 78.0–100.0)
MONOCYTE #: 0.97 x10ˆ3/uL (ref 0.30–1.00)
MONOCYTE %: 13 %
MPV: 8.2 fL (ref 7.5–11.5)
NEUTROPHIL #: 5.16 10*3/uL (ref 1.50–7.70)
NEUTROPHIL #: 5.16 x10ˆ3/uL (ref 1.50–7.70)
NEUTROPHIL %: 66 %
PLATELETS: 202 x10ˆ3/uL (ref 140–450)
RBC: 5.34 10*6/uL (ref 4.06–5.63)
RBC: 5.34 10*6/uL (ref 4.06–5.63)
RDW: 15.4 % — ABNORMAL HIGH (ref 12.0–15.0)
WBC: 7.8 x10ˆ3/uL (ref 3.5–11.0)

## 2017-05-27 LAB — BASIC METABOLIC PANEL
ANION GAP: 12 mmol/L (ref 4–13)
BUN/CREA RATIO: 11 (ref 6–22)
BUN: 29 mg/dL — ABNORMAL HIGH (ref 8–25)
CALCIUM: 9.6 mg/dL (ref 8.5–10.2)
CHLORIDE: 98 mmol/L (ref 96–111)
CO2 TOTAL: 27 mmol/L (ref 22–32)
CREATININE: 2.62 mg/dL — ABNORMAL HIGH (ref 0.62–1.27)
ESTIMATED GFR: 24 mL/min/1.73mˆ2 — ABNORMAL LOW (ref >59)
GLUCOSE: 89 mg/dL (ref 65–139)
GLUCOSE: 89 mg/dL (ref 65–139)
POTASSIUM: 3.4 mmol/L — ABNORMAL LOW (ref 3.5–5.1)
SODIUM: 137 mmol/L (ref 136–145)

## 2017-05-27 LAB — POC BLOOD GLUCOSE (RESULTS)
GLUCOSE, POC: 203 mg/dL — ABNORMAL HIGH (ref 70–105)
GLUCOSE, POC: 86 mg/dL (ref 70–105)
GLUCOSE, POC: 227 mg/dL — ABNORMAL HIGH (ref 70–105)

## 2017-05-27 MED ORDER — POLYETHYLENE GLYCOL 3350 17 GRAM ORAL POWDER PACKET: 17 g | Freq: Every day | ORAL | 0 refills | 0 days | Status: DC | PRN

## 2017-05-27 MED ORDER — ISOSORBIDE MONONITRATE ER 30 MG TABLET,EXTENDED RELEASE 24 HR
30.0000 mg | ORAL_TABLET | Freq: Every day | ORAL | Status: AC
Start: 2017-05-28 — End: ?

## 2017-05-27 MED ORDER — AMIODARONE 400 MG TABLET: 400 mg | Tab | Freq: Two times a day (BID) | ORAL | 0 refills | 0 days | Status: DC

## 2017-05-27 MED ORDER — TORSEMIDE 20 MG TABLET
40.0000 mg | ORAL_TABLET | Freq: Every day | ORAL | Status: DC
Start: 2017-05-28 — End: 2017-05-27

## 2017-05-27 MED ORDER — HYDRALAZINE 10 MG TABLET: 10 mg | Freq: Three times a day (TID) | ORAL | 0 refills | 0 days | Status: DC

## 2017-05-27 MED ORDER — AMIODARONE 200 MG TABLET
200.0000 mg | ORAL_TABLET | Freq: Every day | ORAL | Status: DC
Start: 2017-06-01 — End: 2017-05-27

## 2017-05-27 MED ORDER — TORSEMIDE 20 MG TABLET
40.0000 mg | ORAL_TABLET | Freq: Every day | ORAL | Status: DC
Start: 2017-05-28 — End: 2018-01-13

## 2017-05-27 MED ORDER — SENNOSIDES 8.6 MG-DOCUSATE SODIUM 50 MG TABLET: 1 | Freq: Two times a day (BID) | ORAL | 0 refills | 0 days | Status: AC

## 2017-05-27 MED ORDER — POTASSIUM CHLORIDE ER 20 MEQ TABLET,EXTENDED RELEASE(PART/CRYST)
20.00 meq | ORAL_TABLET | Freq: Every morning | ORAL | Status: DC
Start: 2017-05-28 — End: 2017-05-27

## 2017-05-27 MED ORDER — POTASSIUM CHLORIDE ER 20 MEQ TABLET,EXTENDED RELEASE(PART/CRYST)
20.0000 meq | ORAL_TABLET | ORAL | Status: AC
Start: 2017-05-27 — End: 2017-05-27
  Administered 2017-05-27: 20 meq via ORAL
  Filled 2017-05-27: qty 1

## 2017-05-27 MED ORDER — AMIODARONE 200 MG TABLET
200.0000 mg | ORAL_TABLET | Freq: Every day | ORAL | Status: DC
Start: 2017-06-01 — End: 2017-06-01

## 2017-05-27 MED ORDER — APIXABAN 5 MG TABLET: 5 mg | Freq: Two times a day (BID) | ORAL | 0 refills | 0 days | Status: AC

## 2017-05-27 MED ADMIN — insulin lispro 100 unit/mL subcutaneous solution: SUBCUTANEOUS | @ 11:00:00

## 2017-05-27 MED ADMIN — midazolam 2 mg/mL oral syrup: ORAL | @ 07:00:00

## 2017-05-27 NOTE — Discharge Summary (Signed)
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 Principle Problem: Acute on chronic systolic heart failure, NYHA class 4 (CMS HCC) 05/27/2017   . 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 Ischemic cardiomyopathy 10/24/2013   . Persistent atrial fibrillation (CMS HCC) 04/10/2016   . Hyperlipidemia 06/21/2014   . 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 Essential hypertension 08/29/2011   . Type 2 diabetes mellitus with renal complication (CMS HCC) 08/29/2011   . Hypokalemia 05/24/2017   . Stage 3 chronic kidney disease ([facility]) 11/07/2014      Resolved Hospital Problems    Diagnosis    No resolved problems to display.     Active Non-Hospital Problems    Diagnosis Date Noted   . Bilateral lower extremity edema 09/16/2016   . Diarrhea 07/24/2016   . Anemia 06/21/2014   . Colon polyps 06/21/2014   . S/P angioplasty with stent 09/25/2011   . LBBB (left bundle branch block) 08/28/2011      Allergies   Allergen Reactions   . Lipitor [Atorvastatin]      Concerned about cognitive function.   . Pravastatin Rash      DISCHARGE MEDICATIONS:     Current Discharge Medication List      START taking these medications.       Details    * Amiodarone 400 mg Tablet   Commonly known as:  PACERONE    400 mg, Oral, 2x/day   Qty:  9 Tab   Refills:  0       * amiodarone 200 mg Tablet   Commonly known as:  PACERONE   Start taking on:  06/01/2017     200 mg, Oral, Daily   Refills:  0       apixaban 5 mg Tablet   Commonly known as:  ELIQUIS    5 mg, Oral, 2x/day   Refills:  0       hydrALAZINE 10 mg Tablet   Commonly known as:  APRESOLINE    10 mg, Oral, Q8HRS   Refills:  0       isosorbide mononitrate 30 mg Tablet Sustained Release 24 hr   Commonly known as:  IMDUR   Start taking on:  05/28/2017    30 mg, Oral, Daily   Refills:  0       polyethylene glycol 17 gram Powder in Packet   Commonly known as:  MIRALAX    17 g, Oral, Daily PRN   Refills:  0       sennosides-docusate sodium 8.6-50 mg Tablet   Commonly known as:  SENOKOT-S    1 Tab, Oral, 2x/day   Refills:  0       torsemide 20 mg Tablet   Commonly known as:  DEMADEX   Start taking  on:  05/28/2017    40 mg, Oral, Daily   Refills:  0       * Notice:  This list has 2 medication(s) that are the same as other medications prescribed for you. Read the directions carefully, and ask your doctor or other care provider to review them with you.      CONTINUE these medications - NO CHANGES were made during your visit.       Details    aspirin 81 mg Tablet, Delayed Release (E.C.)   Commonly known as:  ECOTRIN    81 mg, Oral, Daily   Refills:  0       cholecalciferol (vitamin D3) 1,000 unit Tablet    1,000 Units, Oral, Daily   Refills:  0       donepezil 10 mg Tablet   Commonly known as:  ARICEPT    10 mg, Oral, NIGHTLY   Refills:  0       Ferrous Sulfate 142 mg (45 mg iron) Tablet Sustained Release    Oral   Refills:  0       FISH OIL 1,000 mg (120 mg-180 mg) Capsule   Generic drug:  omega-3-DHA-EPA-fish oil    Oral, Daily   Refills:  0       FLUTICASONE NASL    Nasal   Refills:  0       folic acid 1 mg Tablet   Commonly known as:  FOLVITE    1 mg, Daily   Refills:  0       insulin glargine 100 unit/mL injection (vial)   Commonly known as:  LANTUS    15 Units, Subcutaneous, NIGHTLY   Refills:  0       nitroGLYCERIN 0.4 mg Tablet, Sublingual   Commonly known as:  NITROSTAT    0.4 mg, Sublingual, Q5 Min PRN, for 3 doses  over 15 minutes    Refills:  0       raNITIdine 75 mg Tablet   Commonly known as:  ZANTAC    75 mg, Oral, Daily   Refills:  0         STOP taking these medications.          carvedilol 12.5 mg Tablet   Commonly known as:  COREG       digoxin 125 mcg Tablet   Commonly known as:  LANOXIN       losartan 25 mg Tablet   Commonly known as:  COZAAR       metOLazone 5 mg Tablet   Commonly known as:  ZAROXOLYN           Discharge medication list refreshed?  YES    During this hospitalization did the patient have an AMI, PCI/PCTA, STENT or Isolated CABG?  No              DISCHARGE INSTRUCTIONS:  Follow-up Information     Follow up with [facility], Jeannetta Nap .    Specialty:  Cardiology    Contact information:    [address]  [place] [address]  [phone]    Additional information:    Below are driving directions to the [facility] located in [place], [place].  If you need any additional information, please call [phone].  You may also visit our website at [email or web address]*[address] [place]*Merge onto [address] toward [place](31.70 miles)*Take [address] ramp toward [employer].  Stay straight  and go onto [address]  Turn slight right onto [address] to [facility]          BASIC METABOLIC PANEL     MAGNESIUM     SCHEDULE FOLLOW-UP - CARDIOLOGY [place]   Follow-up in: 1 WEEK WITHIN 4 TO 7 DAYS   Reason for visit: HOSPITAL DISCHARGE    Follow-up reason: Congestive Heart Failure Hospital Discharge Follow Up    Provider: Dr. Dellia Beckwith      REASON FOR HOSPITALIZATION AND HOSPITAL COURSE: [name] is a 76 year-old male with medical history including CAD s/p PCI, ICM HFrEF (20-25%), AFib s/p AICD s/p AVN ablation on Eliquis, IDDM2, and dementia who was admitted for acute on chronic systolic heart failure.  The patient initially presented to [facility] for a diagnostic right and left heart cath.  He was found to have  occlusive disease in the LAD and LCA and a cardiac index of 1. The patient was diuresed with furosemide IV and metolazone then transferred to [facility] for further evaluation and management. The patient was determined to be a poor candidate for high-risk PCI given his comorbidities.  Transthoracic echocardiogram on 05/20/2017 showed global left ventricular hypokinesis and an LVEF of 27%.  Therefore, the patient was managed conservatively with medical therapy.  The patient was diuresed with furosemide IV and eventually required a furosemide infusion.  Following appropriate diuresis, the patient's diuretic regimen was changed to torsemide 40 mg PO daily.  Higher diuretic dose was not prescribed on discharged due to increasing serum creatinine.  If the patient develops evidence of volume overload following hospital discharge, torsemide should be increased to 40 mg PO BID.  Outpatient medications carvedilol and losartan were held this admission for acute decompensated systolic heart failure and elevated serum creatinine, respectively.  Regarding the patient's atrial fibrillation, digoxin was discontinued secondary to chronic kidney disease (baseline serum creatinine ~2.5).  The patient was started on amiodarone.  He was discharged on amiodarone 400 mg PO BID (to be continued through 05/31/2017).  He will be changed to maintenance daily dosing with amiodarone 200 mg PO daily on 06/01/2017.  He was started on apixiban 5 mg PO BID for anticoagulation/stroke risk reduction.  The patient will be arranged follow up with [facility] (Dr. Dellia Beckwith) with laboratory testing (BMP and Mg) within 4 to 7 days of hospital discharge.    CONDITION ON DISCHARGE:  A. Ambulation: Ambulation with minimal assist/front wheeled walker  B. Self-care Ability: Partial self-care in nursing facility  C. Cognitive Status: Alert and oriented to self and situation  D. DNR status at discharge: No chest compressions.  Keep  defibrillator.  If needed, would like escalation of care to ICU setting with a time-limited trial of intubation for 1 week.  If needed, would like vasopressors.  No dialysis.  No tracheostomy.  No feeding tube placement.    Advance Directive Information       Most Recent Value    Does the Patient have an Advance Directive? Yes, Patient Does Have Advance Directive for Healthcare Treatment    Type of Advance Directive Completed Medical Power of Attorney, Medical Living Will    Copy of Advance Directives in Chart? Yes, Copy on Chart.(Specify in Comment Which Advance Directive)    Name of MPOA or Healthcare Surrogate [name]     Phone Number of MPOA or Healthcare Surrogate [phone]        DISCHARGE DISPOSITION:  Skilled Nursing Unit - [facility])      Joy Weyman Croon, M.D., 05/27/2017    Copies sent to Care Team       Relationship Specialty Notifications Start End    Nehemiah Settle, MD PCP - General EXTERNAL  11/21/13     Phone: [phone] Fax: [phone]         [address] [place] [address]    Nehemiah Settle, MD PCP - Secondary Care EXTERNAL  11/11/13     Phone: [phone] Fax: [phone]         [address] [place] [address]      Referring providers can utilize [email or web address] to access their referred [facility] patient's information.           Agree with plan of discharge.     Andrey Spearman Jishnu Jenniges, DO [facility]

## 2017-05-27 NOTE — Care Plan (Signed)
Problem: Patient Care Overview (Adult,OB)  Goal: Plan of Care Review(Adult,OB)  The patient and/or their representative will communicate an understanding of their plan of care   Outcome: Ongoing (see interventions/notes)  Pt resting throughout the night. VSS on RA. No c/o pain during the night. Pt repositioned independently and with help as needed. High fall precautions maintained with sitter select and call bell within reach. Pt instructed to call out for assistance as needed. Strict I&O followed. Emotional support provided and questions answered. Plan is to d/c to SNF this afternoon. Family has been updated on d/c plan.   Goal: Individualization/Patient Specific Goal(Adult/OB)  Outcome: Ongoing (see interventions/notes)      Problem: Cardiac: ACS (Acute Coronary Syndrome) (Adult)  Prevent and manage potential problems including:  1. cardiovascular structural defects  2. chest pain (angina)  3. dysrhythmia/arrhythmia  4. embolism  5. heart failure/shock  6. ischemia leading to infarction  7. pericarditis  8. situational response   Goal: Signs and Symptoms of Listed Potential Problems Will be Absent, Minimized or Managed (Cardiac: ACS)  Signs and symptoms of listed potential problems will be absent, minimized or managed by discharge/transition of care (reference Cardiac: ACS (Acute Coronary Syndrome) (Adult) CPG).   Outcome: Ongoing (see interventions/notes)      Problem: Fall Risk (Adult)  Goal: Absence of Falls  Patient will demonstrate the desired outcomes by discharge/transition of care.   Outcome: Ongoing (see interventions/notes)      Problem: Nutrition, Imbalanced: Inadequate Oral Intake (Adult)  Goal: Improved Oral Intake  Patient will demonstrate the desired outcomes by discharge/transition of care.   Outcome: Ongoing (see interventions/notes)    Goal: Prevent Further Weight Loss  Patient will demonstrate the desired outcomes by discharge/transition of care.   Outcome: Ongoing (see interventions/notes)       Problem: Skin Injury Risk (Adult,Obstetrics,Pediatric)  Goal: Skin Health and Integrity  Patient will demonstrate the desired outcomes by discharge/transition of care.   Outcome: Ongoing (see interventions/notes)      Problem: Cardiac: Heart Failure (Adult)  Prevent and manage potential problems including:  1. cardiac cachexia  2. cardiac pump dysfunction  3. decreased quality of life  4. dysrhythmia/arrhythmia  5. fluid/electrolyte imbalance  6. functional decline/self-care deficit  7. respiratory compromise  8. situational response  9. sleep-disordered breathing   Goal: Signs and Symptoms of Listed Potential Problems Will be Absent, Minimized or Managed (Cardiac: Heart Failure)  Signs and symptoms of listed potential problems will be absent, minimized or managed by discharge/transition of care (reference Cardiac: Heart Failure (Adult) CPG).   Outcome: Ongoing (see interventions/notes)

## 2017-05-27 NOTE — Discharge Instructions (Signed)
Things To Do When You Get Home:  . Take all your medications as directed  . Follow a low sodium diet (Stay below 2,000 mg a day)  . Do not drink more than 2 liters of fluid each day (about the same as 1/2 gallon)  . Keep your follow up visit(s) with your cardiologist and/or primary care physician  . Weigh yourself first thing every morning, after using the bathroom and before you eat or drink anything  . Keep track of your weight on sheet provided or a calendar  . Bring you daily weight record to your follow up appointment   . Call the [facility] for Advanced Heart Failure and Pulmonary Hypertension  if you gain 2 pounds or more in one day or 5 pounds in a week  . Call the [facility] for Advanced Heart Failure and Pulmonary Hypertension  if your symptoms occur or get worse:  o Dry hacking cough  o Worsening shortness of breath with activity  o Increase swelling in your legs, feet, ankles  o Increase swelling or tightness in your abdomen   o Waking up short of breath  o Difficulty breathing when lying flat  o Trouble sleeping     Please call the [facility] Clinic direct line at [phone] if you are having symptoms listed above or with any other heart failure related questions or concerns.  For your follow up appointment:  o Bring your list of daily weights (and blood pressures and pulse, if you take them regularly).  o Bring all of your prescription medications and over the counter medications in their original bottles  [facility] for Advanced Heart Failure and Pulmonary Hypertension : [phone].       Discharge Recommendations/ Plan:Discharge to:SNF Return (Medicare certified) (code 3)      Resources: Please call and provide nursing report to [facility] [phone]

## 2017-05-27 NOTE — Care Management Notes (Signed)
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 [facility] ambulance transport scheduled for 2 pm for pt to return to [facility]. MSW notified service and bedside RN of update. AVS updated.  [facility] Michelle aware of pt return.     The patient will continue to be evaluated for developing discharge needs.     Case Manager: Purcell Mouton  Phone: [phone]

## 2017-05-27 NOTE — Progress Notes (Signed)
[facility]  Advanced Heart Failure & Pulmonary Hypertension Progress Note    Patient: [name]  Date of Service: 05/27/2017  Date: 05/27/17   LOS: 7 days     Subjective: [name] is a 76 year-old male who reports being ready to return to his nursing facility in [place].  He is aware of the plan for ambulance transport today.  He denies shortness of breath and chest pain at rest.  He denies palpitations and lightheadedness.  No issues were reported overnight.    Date 05/26/17 0700 - 05/27/17 0659 05/27/17 0700 - 05/28/17 0659   Shift 0700-1459 1500-2259 2300-0659 24 Hour Total 0700-1459 1500-2259 2300-0659 24 Hour Total   I  N  T  A  K  E   P.O. 0 537 100 637          Oral 0 537 100 637        I.V.  (mL/kg/hr)  10  (0.02)  10  (0.01)          Med (IV) Flush Volume  10  10        Shift Total  (mL/kg) 0  (0) 547  (7.31) 100  (1.35) 647  (8.72)       O  U  T  P  U  T   Urine  (mL/kg/hr) 225  (0.38) 300  (0.5) 325  (0.55) 850  (0.48)          Urine (Voided) 225 300 325 850          Urine Occurrence 0 x 0 x 0 x 0 x        Stool              Stool Occurrence 0 x 0 x  0 x        Shift Total  (mL/kg) 225  (3.01) 300  (4.01) 325  (4.38) 850  (11.46)       Weight (kg) 74.8 74.8 74.2 74.2 74.2 74.2 74.2 74.2     Vitals:  Filed Vitals:    05/26/17 2326 05/27/17 0317 05/27/17 0618 05/27/17 0735   BP: (!) 103/54 93/64 119/75 105/60   Pulse: 77 74  70   Resp: 16 16  18    Temp: 36.7 C (98 F) 37 C (98.6 F)  36.4 C (97.5 F)   SpO2: 91% 90%  93%     Physical Exam:   Constitutional: Chronically ill appearing without acute distress.  Alert and cooperative during exam.  Eyes: Left eye and right eye withoutobvious abnormalities. No scleral icterus or conjunctival erythema noted.   Head: Normocephalic without obvious abnormality. Face symmetrical.  Neck: Supple, symmetric, trachea midline, no masses. The thyroid appears normal without thyromegaly. No jugular venous distention.  Respiratory:  Clear to ascultation bilaterally. No wheezing, rales, or rhonchi. No acute respiratory distress noted.  Cardiovascular:Regular rate and rhtyhm. S1, S2 are normal. There is no murmur, click, rub, or gallop appreciated.  Lower Extremities: 2+ dorsalis pedis pulses bilaterally. There is no cyanosis or clubbing. No lower extremity edema.  Gastrointestinal: Normal bowel sounds. Nontender to palpation. No masses, hepatomegaly, or splenomegaly. No abdominal bruit noted.   Musculoskeletal: No joint tenderness, obvious deformity, or swelling.  Skin: Warm and dry. No rashes or suspicious skin lesions noted.   Neurologic: No focal deficits. Alert and oriented to person and situation.  Psychiatric: Appropriate affect, mood, and behavior.    Labs - Review of Data:  CBC Results  Differential Results   Recent Results (from the past 30 hour(s))   CBC WITH DIFF    Collection Time: 05/27/17  6:27 AM   Result Value    WBC 7.8    HGB 15.9    HCT 48.1 (H)    PLATELETS 202    Recent Results (from the past 30 hour(s))   CBC WITH DIFF    Collection Time: 05/27/17  6:27 AM   Result Value    WBC 7.8    NEUTROPHIL % 66    LYMPHOCYTE % 19    MONOCYTE % 13    EOSINOPHIL % 2    BASOPHIL % 1    BASOPHIL # 0.05      BMP Results Other Chemistries Results   Results for orders placed or performed during the hospital encounter of 05/20/17 (from the past 30 hour(s))   BASIC METABOLIC PANEL    Collection Time: 05/27/17  6:27 AM   Result Value    SODIUM 137    POTASSIUM 3.4 (L)    CHLORIDE 98    CO2 TOTAL 27    GLUCOSE 89    BUN 29 (H)    CREATININE 2.62 (H)    Recent Results (from the past 30 hour(s))   PHOSPHORUS    Collection Time: 05/26/17  5:58 AM   Result Value    PHOSPHORUS 3.5   MAGNESIUM    Collection Time: 05/26/17  5:58 AM   Result Value    MAGNESIUM 2.1      Liver/Pancreas Enzyme Results Liver Function Results   No results found for this or any previous visit (from the past 30 hour(s)). No results found for this or any previous visit (from  the past 30 hour(s)).   Cardiac Results Coags Results   No results found for this or any previous visit (from the past 30 hour(s)). No results found for this or any previous visit (from the past 30 hour(s)).       Current Facility-Administered Medications:  amiodarone (CORDARONE) tablet 400 mg Oral 2x/day   [START ON 06/01/2017] amiodarone (CORDARONE) tablet 200 mg Oral Daily   apixaban (ELIQUIS) tablet 5 mg Oral 2x/day   aspirin chewable tablet 81 mg 81 mg Oral Daily   cholecalciferol (VITAMIN D3) tablet 1,000 Units Oral Daily   donepezil (ARICEPT) tablet 5 mg Oral NIGHTLY   famotidine (PEPCID) tablet 20 mg Oral Daily   ferrous sulfate 324 mg (65 mg elemental IRON) tablet 324 mg Oral Daily before Breakfast   folic acid (FOLVITE) tablet 1 mg Oral Daily   hydrALAZINE (APRESOLINE) tablet 10 mg Oral Q8HRS   insulin glargine (LANTUS) 100 units/mL injection 12 Units Subcutaneous NIGHTLY   isosorbide mononitrate (IMDUR) 24 hr extended release tablet 30 mg Oral Daily   polyethylene glycol (MIRALAX) oral packet 17 g Oral Daily   sennosides-docusate sodium (SENOKOT-S) 8.6-50mg  per tablet 1 Tab Oral 2x/day   SSIP insulin lispro (HUMALOG) 100 units/mL injection 3-9 Units Subcutaneous 4x/day PRN   torsemide (DEMADEX) tablet 40 mg Oral 2x/day     Beta Blocker: No; carvedilol held this admission for acute decompensated systolic heart failure  ACE/ARB: No; losartan held for elevated serum creatinine  MRA: No; not on MRA due to chronic kidney disease stage IV  VTE prophylaxis/Anticoagulation: apixaban (indication: atrial fibrillation)  Ulcer prophylaxis: H2 blocker - famotidine   Family communication: Phone update  Code status/Treatment limitations: No chest compressions.  Keep defibrillator.  If needed, would like escalation of care to ICU setting  with a time-limited trial of intubation for 1 week.  If needed, would like vasopressors.  No dialysis.  No tracheostomy.  No feeding tube placement    RHC:  05/19/2017 RHC:  Pressure was  obtained in the right atrium and then this catheter was introduced across tricuspid valve into the right ventricle and right ventricular pressure recording was obtained and then the Swan-Ganz catheter was advanced across the pulmonic valve to the left main pulmonary artery and to the wedge position and wedge was recorded and the balloon was deflated and pulmonary artery pressure was obtained and then pulmonary artery saturation was obtained and right femoral artery saturation was obtained.  Then the balloon was reinflated and another wedge pressure was obtained and then it was deflated and then this Swan-Ganz catheter was removed.  The total amount of contrast used was 50 mL of Visipaque and total fluoroscopy time 4.9 minutes finding mean right atrial pressure was 15 mmHg.  Right ventricular pressure was 69/15.  Pulmonary artery pressure was 69/37 with a mean pulmonary artery pressure of 48 mmHg and pulmonary capillary wedge pressure was 28 mmHg.  Pulmonary artery saturation was 43%.  Femoral artery saturation was 99.2%.  Cardiac output by Fick method was 2.15 L/minute.  Cardiac index was 1.02 L/minute.    Echocardiogram:   05/20/2017 Transthoracic Echocardiogram:  Conclusions:  1. Normal left ventricular size. Severely depressed left ventricular systolic function. LV Ejection Fraction is 27 %. Normal geometry. Abnormal diastolic function, elevated filling pressure.  2. Resting Segmental Wall Motion Analysis: Total wall motion score is 2.00. There is global left ventricular hypokinesis.  3. The right ventricle is not well visualized, at least mildly dysfunctional right ventricle. RV systolic pressure is consistent with moderate pulmonary hypertension. Elevated right atrial pressure.  4. There is mild secondary mitral regurgitation.  5. No aortic regurgitation seen. No Aortic Valve Stenosis.  6. There is mild tricuspid regurgitation.  Findings:  Left Ventricle: Normal left ventricular size. Severely depressed left  ventricular systolic function. LV Ejection Fraction is 27 %. Normal geometry. Abnormal diastolic function, elevated filling pressure. No left ventricular thrombus visualized.  Resting Segmental Wall Motion Analysis: Total wall motion score is 2.00. There is global left ventricular hypokinesis.  Right Ventricle: The right ventricle is not well visualized, at least mildly dysfunctional right ventricle. RV systolic pressure is consistent with moderate pulmonary hypertension. Echo density in right ventricle suggestive of catheter, pacer lead, or ICD lead.  Left Atrium: The left atrium is not well visualized.  Right Atrium: The right atrium is not well visualized.  Mitral Valve: There is mild secondary mitral regurgitation.  Aortic Valve: No aortic regurgitation seen. No Aortic Valve Stenosis.  Tricuspid Valve: There is mild tricuspid regurgitation.  Pulmonic Valve: There is trivial pulmonic regurgitation.  Pericardium: Normal pericardium with no pericardial effusion.  Aorta: Aorta not well visualized.  IVC: The inferior vena cava is dilated. The inferior vena cava demonstrates no inspiratory collapse, consistent with significantly elevated right atrial pressure (>15 mmHg).    Chest Radiograph:  05/20/2017 Mobile CXR:  Findings: Compared to 11/22/2013. The heart is enlarged. There is a shallow inspiration. Fluid and atelectasis are present at the right lung base. Central vascular congestion is noted. Biventricular pacemaker defibrillator is present.  Impression: Unchanged airspace opacity on the right.    Assessment/Plan:  Mr. 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 Dellia [facility]zingi [facility] within 4-7 days    CAD s/p PCI   - Status-post PCI with DES to LAD and RCA (2013)  - Cardiac cath 05/19/2017 showed critical stenosis of proximal LAD, mid-LAD, and mid-RCA, and total occlusion diagonal branch of LAD and OM branch of LCx  - Determined not to be a candidate for high-risk intervention due to multiple co-morbidities  - Continue ASA 81 mg daily  - Holding carvedilol and losartan for acute decompensated systolic heart failure and elevated serum creatinine, respectively  - Discharge on hydralazine 10 mg Q8H and isosorbide mononitrate 30 mg daily   - Intolerant to statins    Atrial fibrillation s/p BiV-D (2015) and AVN ablation (04/2016)  - Digoxin discontinued this admission due to chronic kidney disease stage IV  - Amiodarone started this admission; continue amiodarone 400 mg BID through 05/31/2017 then decrease to maintenance dose amiodarone 200 mg daily 06/01/2017   - CHADS2VASc score 6; HASBLED 5  - Apixaban 5 mg BID for anticoagulation/stroke risk reduction    Diabetes mellitus type 2  - Continue insulin glargine 7 units nightly (prior outpatient dose)  - Continue insulin lispro sliding scale while inpatient    Chronic kidney disease stage IV  - Baseline serum creatinine ~2.4; creatinine 2.62 today  - Decreased to torsemide 40 mg daily  - Arrange for outpatient labs (BMP with Mg) at time of hospital discharge follow up in 4-7 days     Dementia  - Continue donepezil 10 mg nightly (prior outpatient dose)    Iron deficiency anemia  - Continue daily iron supplementation    Dis[facility]      Ander SladeJoy Weyman CroonJeannine Juskowich, M.D., 05/27/2017      Late entry for 05/27/17. I saw and examined the patient.  I reviewed the resident's note.  I agree with the findings and plan of care as documented in the resident's note.  Any exceptions/additions are edited/noted.    Andrey SpearmanGeorge G Jericho Cieslik, DO

## 2017-05-27 NOTE — Nurses Notes (Signed)
Patient given discharge instructions as directed by MD and handouts on all new medications. Discharge packet sent with patient to [facility]. Report called to [facility] [phone]. Patient verbalized understanding of all discharge instructions and follow up appointments, sister [name] updated about discharge. No questions at this time from patient. Patient stable at time of discharge, PIV removed. Patient stable at time of discharge, patient being transported by EMS.

## 2017-05-28 ENCOUNTER — Telehealth (HOSPITAL_COMMUNITY): Payer: Self-pay

## 2017-05-28 NOTE — Telephone Encounter (Signed)
[facility]  HEART FAILURE NOTE - Discharge Follow-up      [name]  Date of Service:  05/28/2017  MRN:  [id number]    Attempted to contact patient by phone in regard to recent admission to [facility] for heart failure; however patient was discharged to SNF:  [facility]    Harless Litten, RN, BSN, [facility] 05/28/2017, 12:50  Heart Failure Specialty Care Nurse  EXT:  [phone]

## 2017-06-01 ENCOUNTER — Encounter (INDEPENDENT_AMBULATORY_CARE_PROVIDER_SITE_OTHER): Payer: Self-pay | Admitting: INTERNAL MEDICINE CARDIOVASCULAR DISEASE

## 2017-06-01 ENCOUNTER — Encounter (HOSPITAL_COMMUNITY): Payer: Self-pay

## 2017-06-01 ENCOUNTER — Ambulatory Visit (INDEPENDENT_AMBULATORY_CARE_PROVIDER_SITE_OTHER): Payer: Medicare Other | Admitting: INTERNAL MEDICINE CARDIOVASCULAR DISEASE

## 2017-06-01 VITALS — BP 76/42 | HR 70 | Resp 16 | Ht 72.0 in | Wt 168.0 lb

## 2017-06-01 DIAGNOSIS — I739 Peripheral vascular disease, unspecified: Secondary | ICD-10-CM

## 2017-06-01 DIAGNOSIS — Z9889 Other specified postprocedural states: Secondary | ICD-10-CM

## 2017-06-01 DIAGNOSIS — I272 Pulmonary hypertension, unspecified: Secondary | ICD-10-CM

## 2017-06-01 DIAGNOSIS — I255 Ischemic cardiomyopathy: Secondary | ICD-10-CM

## 2017-06-01 DIAGNOSIS — I447 Left bundle-branch block, unspecified: Secondary | ICD-10-CM

## 2017-06-01 DIAGNOSIS — I482 Chronic atrial fibrillation: Secondary | ICD-10-CM

## 2017-06-01 DIAGNOSIS — I4821 Permanent atrial fibrillation: Secondary | ICD-10-CM

## 2017-06-01 DIAGNOSIS — Z95810 Presence of automatic (implantable) cardiac defibrillator: Secondary | ICD-10-CM

## 2017-06-01 NOTE — Progress Notes (Signed)
[facility] HEART & VASCULAR [place], Jeannetta Nap  [address]  [place] [address]  Phone: [phone]  Fax: [phone]    Encounter Date: 06/01/2017    Patient ID:  [name]  GKK:[id number]    DOB:   Age: 76 y.o. male    Subjective:     Chief Complaint   Patient presents with    Hospital Follow Up    No Complaints     HPI   Mr. Schamp is a pleasant 76 year old gentleman with a past medical history of HTN, HLD, IDDM2, CKD, PAD, CAD status post PCI to the LAD and RCA in February of 2013, ischemic cardiomyopathy HFrEF (20-25%) s/p AICD implantation (05/13/16), permanent atrial fibrillation status post AV nodal ablation, history of apical thrombus and dementia [facility] from [facility] for acute on chronic systolic CHF. He recently underwent right and left heart catheterization on May 19, 2017 with Dr. Dellia Beckwith at [facility] and was found to have critical stenosis of the proximal and mid LAD, patent stent to the mid LAD, totally occluded diagonal branch that was the 2nd diagonal branch of the LAD, occluded small obtuse marginal branch of the circumflex, critical stenosis involving the mid RCA and post capillary pulmonary hypertension. He was transferred to [facility] for further management of his heart failure and was diuresed with furosemide IV and metolazone. He was determined to be a poor candidate for high risk PCI given his comorbidities.  A transthoracic echocardiogram was completed on 05/20/2017 showed global left ventricular hypokinesis with an EF 27% and mild mitral regurgitation. He was managed conservatively with medical therapy and was discharged to [facility] on increased Demadex 40 mg once daily.  His Coreg and losartan were held due to acute decompensated systolic heart failure and an elevated serum creatinine.  In regards to his atrial fibrillation, digoxin was discontinued secondary to his chronic kidney  disease with a baseline serum creatinine around 2.5. He was restarted on amiodarone 400 mg twice daily that was to be transitioned to amiodarone 200 mg once daily.  He used to take amiodarone therapy in the past, but this was discontinued after his PFTs revealed severe airway obstruction and reduced DLCO with concerns for amiodarone toxicity. He remains on chronic anticoagulation with Eliquis 5 mg twice daily for thromboembolic prophylaxis.   He reports not feeling well today and his blood pressure low at 72/45. He has not had any increased weight gain, peripheral edema, orthopnea or PND since discharge.   Denies fever, chills, chest pain, abdominal pain, bleeding complications, syncope or presyncopal events.  No TIA or stroke-like symptoms.          Current Outpatient Prescriptions   Medication Sig    apixaban (ELIQUIS) 5 mg Oral Tablet Take 1 Tab (5 mg total) by mouth Twice daily    aspirin (ECOTRIN) 81 mg Oral Tablet, Delayed Release (E.C.) Take 81 mg by mouth Once a day    cholecalciferol, vitamin D3, 1,000 unit Oral Tablet Take 1,000 Units by mouth Once a day    donepezil (ARICEPT) 10 mg Oral Tablet Take 10 mg by mouth Every night    Ferrous Sulfate 142 mg (45 mg iron) Oral Tablet Sustained Release Take by mouth    fluticasone propionate (FLUTICASONE NASL) by Nasal route    folic acid (FOLVITE) 1 mg Oral Tablet Take 1 mg by mouth Once a day    insulin glargine (LANTUS) 100 unit/mL Subcutaneous injection (  vial) 15 Units by Subcutaneous route Every night    isosorbide mononitrate (IMDUR) 30 mg Oral Tablet Sustained Release 24 hr Take 1 Tab (30 mg total) by mouth Once a day    nitroGLYCERIN (NITROSTAT) 0.4 mg Sublingual Tablet, Sublingual 0.4 mg by Sublingual route Every 5 minutes as needed for Chest pain for 3 doses over 15 minutes    omega-3-DHA-EPA-fish oil (FISH OIL) 1,000 mg (120 mg-180 mg) Oral Capsule Take by mouth Once a day    polyethylene glycol (MIRALAX) 17 gram Oral Powder in Packet Take 1  Packet (17 g total) by mouth Once per day as needed for Other (constipation)    raNITIdine (ZANTAC) 75 mg Oral Tablet Take 75 mg by mouth Once a day    sennosides-docusate sodium (SENOKOT-S) 8.6-50 mg Oral Tablet Take 1 Tab by mouth Twice daily    torsemide (DEMADEX) 20 mg Oral Tablet Take 2 Tabs (40 mg total) by mouth Once a day     Allergies   Allergen Reactions    Lipitor [Atorvastatin]      Concerned about cognitive function.    Pravastatin Rash     Past Medical History:   Diagnosis Date    Automatic implantable cardioverter-defibrillator in situ     BPH (benign prostatic hypertrophy)     CAD (coronary artery disease) 09/25/2011    Cataract     CHF (NYHA class II, ACC/AHA stage C) (CMS HCC) 10/24/2013    CVA (cerebrovascular accident) (CMS HCC)     Dementia     Diabetes mellitus (CMS HCC)     Diabetes mellitus, type 2 (CMS HCC)     Disorder of prostate     Esophageal reflux     HTN (hypertension)     MI (myocardial infarction) (CMS HCC)     Pacemaker     Renal stones     Stented coronary artery     Type 2 diabetes mellitus (CMS HCC)     Wears dentures     Wears glasses          Past Surgical History:   Procedure Laterality Date    CORONARY ARTERY ANGIOPLASTY      HX ADENOIDECTOMY      HX APPENDECTOMY      HX CARDIAC ABLATION      HX HEART CATHETERIZATION      HX LITHOTRIPSY      HX PACEMAKER DEFIBRILLATOR PLACEMENT      HX TONSILLECTOMY           Family Medical History:     Problem Relation (Age of Onset)    Cancer Sister    Coronary Artery Disease Other    Diabetes Mother, Sister    High Cholesterol Father    Hypertension Father            Social History   Substance Use Topics    Smoking status: Never Smoker    Smokeless tobacco: Never Used    Alcohol use No       Review of Systems   Constitutional: Negative for activity change, appetite change, chills, fatigue, fever and unexpected weight change.   HENT: Negative for hearing loss.    Respiratory: Negative for apnea, cough,  chest tightness, shortness of breath and wheezing.    Cardiovascular: Negative for chest pain, palpitations and leg swelling.   Gastrointestinal: Negative for abdominal pain and blood in stool.   Genitourinary: Negative for hematuria.   Musculoskeletal: Negative for arthralgias and myalgias.   Neurological: Negative  for dizziness, tremors, seizures, syncope, facial asymmetry, speech difficulty, light-headedness and headaches.   Psychiatric/Behavioral: Negative for sleep disturbance.     Objective:   Vitals: BP (!) 76/42   Pulse 70   Resp 16   Ht 1.829 m (6')   Wt 76.2 kg (168 lb)   SpO2 97%   BMI 22.78 kg/m2        Physical Exam   Constitutional: He is oriented to person, place, and time. He appears well-developed.   Neck: Normal range of motion. Neck supple. No JVD present.   Cardiovascular: Normal rate, regular rhythm, S1 normal, S2 normal and normal pulses.  Exam reveals no gallop and no friction rub.    No murmur heard.  Pulmonary/Chest: Effort normal and breath sounds normal. No respiratory distress. He has no wheezes. He has no rales. He exhibits no tenderness.   Abdominal: Soft. Bowel sounds are normal. There is no tenderness.   Musculoskeletal: Normal range of motion. He exhibits no edema or tenderness.   Neurological: He is alert and oriented to person, place, and time.   Psychiatric: He has a normal mood and affect. His behavior is normal.   Nursing note and vitals reviewed.    Last CBC       WBC HGB HCT MCV Platelets      05/27/17 0627 7.8 15.9 48.1(H) 90.1 202         Last BMP        Na   K   Cl   CO2   BUN   Cr   Calcium   Glucose   Glucose-Fasting        05/27/17 0627 137 3.4(L) 98 27 29(H) 2.62(H) 9.6 89           Assessment & Plan:     ENCOUNTER DIAGNOSES     ICD-10-CM   1. Permanent atrial fibrillation (CMS HCC):  Will have him discontinue Amiodarone therapy due to his history of Amiodarone toxicity.   He remains on thromboembolic prophylaxis with Eliquis 5 mg twice daily. I48.2   2. S/P AV nodal  ablation: s/p BiV ICD. Z98.890   3. Biventricular ICD (implantable cardioverter-defibrillator) in place: Chronic LBBB, history of AVN ablation and HFrEF. Z95.810   4. Ischemic cardiomyopathy: EF 20%. BP is low today at 72/45. Will hold Hydralazine 10mg  TID to allow improvement in his blood pressure. Will need close monitoring of his BP at [facility]. I25.5   5. LBBB (left bundle branch block) I44.7   6. PAD (peripheral artery disease) (CMS HCC) I73.9   7. 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 On Demadex 40mg  once a day.  I27.20       No orders of the defined types were placed in this encounter.      Return in about 2 weeks (around 06/15/2017). With repeat blood work at that time to reassess renal function and electrolytes.     Ashok Norris, PA-C  Late entry for 06/01/2017. I personally saw and evaluated the patient. See mid-level's note for additional details. My findings/participation are severe cardiomyopathy optimizing medications, hypotension is limiting medication optimization, will follow closely    Thornell Sartorius, MD

## 2017-06-01 NOTE — Nursing Note (Signed)
Pt did not have a med list with them at visit - went over list with patient   Advised patient to check AVS list with medicines at home and call if any discrepancies    -Confirmed correct pharmacy with patient for e-scribing     Loleta Chance, MA  06/01/2017, 11:56

## 2017-06-08 DIAGNOSIS — Z95810 Presence of automatic (implantable) cardiac defibrillator: Secondary | ICD-10-CM

## 2017-06-08 DIAGNOSIS — N189 Chronic kidney disease, unspecified: Secondary | ICD-10-CM

## 2017-06-08 DIAGNOSIS — I272 Pulmonary hypertension, unspecified: Secondary | ICD-10-CM

## 2017-06-08 DIAGNOSIS — I255 Ischemic cardiomyopathy: Secondary | ICD-10-CM

## 2017-06-08 DIAGNOSIS — R748 Abnormal levels of other serum enzymes: Secondary | ICD-10-CM

## 2017-06-08 DIAGNOSIS — E1122 Type 2 diabetes mellitus with diabetic chronic kidney disease: Secondary | ICD-10-CM

## 2017-06-08 DIAGNOSIS — I482 Chronic atrial fibrillation: Secondary | ICD-10-CM

## 2017-06-08 DIAGNOSIS — I2510 Atherosclerotic heart disease of native coronary artery without angina pectoris: Secondary | ICD-10-CM

## 2017-06-08 DIAGNOSIS — I5023 Acute on chronic systolic (congestive) heart failure: Secondary | ICD-10-CM

## 2017-06-09 ENCOUNTER — Encounter (INDEPENDENT_AMBULATORY_CARE_PROVIDER_SITE_OTHER): Payer: Self-pay | Admitting: Family

## 2017-06-17 ENCOUNTER — Encounter (INDEPENDENT_AMBULATORY_CARE_PROVIDER_SITE_OTHER): Payer: Self-pay | Admitting: INTERNAL MEDICINE CARDIOVASCULAR DISEASE

## 2017-06-17 ENCOUNTER — Encounter (INDEPENDENT_AMBULATORY_CARE_PROVIDER_SITE_OTHER): Payer: Self-pay

## 2017-06-18 ENCOUNTER — Encounter (INDEPENDENT_AMBULATORY_CARE_PROVIDER_SITE_OTHER): Payer: Self-pay | Admitting: Internal Medicine

## 2017-06-18 ENCOUNTER — Ambulatory Visit (INDEPENDENT_AMBULATORY_CARE_PROVIDER_SITE_OTHER): Payer: Medicare Other | Admitting: Internal Medicine

## 2017-06-18 VITALS — BP 109/57 | HR 83 | Resp 18 | Ht 72.0 in | Wt 169.0 lb

## 2017-06-18 DIAGNOSIS — I255 Ischemic cardiomyopathy: Secondary | ICD-10-CM

## 2017-06-18 DIAGNOSIS — I2510 Atherosclerotic heart disease of native coronary artery without angina pectoris: Secondary | ICD-10-CM

## 2017-06-18 DIAGNOSIS — I4891 Unspecified atrial fibrillation: Secondary | ICD-10-CM

## 2017-06-18 DIAGNOSIS — I272 Pulmonary hypertension, unspecified: Secondary | ICD-10-CM

## 2017-06-18 DIAGNOSIS — Z9889 Other specified postprocedural states: Secondary | ICD-10-CM

## 2017-06-18 NOTE — Nursing Note (Signed)
Meds checked with patients list and confirmed correct pharmacy with patient for e-scribing.     [place], [place]  06/18/2017, 10:32

## 2017-06-18 NOTE — Progress Notes (Signed)
[facility], [place]  [address]  [place] [id number]    PATIENT NAME:  [facility] RichesWilliam [name] [facility]  HOSPITAL NUMBER:  [id number]  DATE OF SERVICE:  06/18/2017  DATE OF BIRTH:      PRIMARY CARE PHYSICIAN:  Nehemiah SettleH Ariel Valentine, MD    Chief Complaint   Patient presents with   . FOLLOWUP INDIVIDUAL THERAPY-MED CHECK   . Cough   . Shortness of Breath       HPI: Jim MainlandWilliam [name] [name] is a pleasant 76 y.o. male presenting for follow up after being hospitalized at [facility] November 26th with acute on chronic systolic CHF exacerbation with a BNP of 1571. Prior to his hospitalization, his weight had increased from 168 pounds to 184 pounds. He notes that he is feeling better since he has been discharged. He reports a cough and dyspnea that worsens when he lies down, but he states that he has a cold. His weight has remained stable with a reading of 169 pounds today. He has a past medical history of HTN, HLD, IDDM2, CKD, PAD, CAD status post PCI to the LAD and RCA in February of 2013, ischemic cardiomyopathy HFrEF (20-25%) s/p AICD implantation (05/13/16), permanent atrial fibrillation status post AV nodal ablation, history of apical thrombus and dementia. He recently underwent right and left heart catheterization on May 19, 2017 with Dr. Dell[facility]Berzingi [facility] and was found to have critical stenosis of the proximal and mid LAD, patent stent to the mid LAD, totally occluded diagonal branch that was the 2nd diagonal branch of the LAD, occluded small obtuse marginal branch of the circumflex, critical stenosis involving the mid RCA and post capillary pulmonary hypertension. He was [facility] for further management of his heart failure and was diuresed with furosemide IV and metolazone. He was determined to be a poor candidate for high risk PCI given his comorbidities.  A transthoracic echocardiogram that was completed on 05/20/2017 showed global left ventricular hypokinesis  with an EF 27% and mild mitral regurgitation. He was managed conservatively with medical therapy and was [facility] on increased Demadex 40 mg once daily.  His Coreg and losartan were held due to acute decompensated systolic heart failure and an elevated serum creatinine.  In regards to his atrial fibrillation, digoxin was discontinued secondary to his chronic kidney disease with a baseline serum creatinine around 2.5. His Amiodarone was discontinued at his last appointment due to concerns for Amiodarone toxicity. He remains on chronic anticoagulation with Eliquis 5 mg twice daily for thromboembolic prophylaxis. He denies chest pain/pressure/tightness, palpitations, [name]/v, abdominal pain, bleeding complications, edema, pre-syncope, and syncope. No TIA or stroke-like symptoms.    Past Medical History  Current Outpatient Prescriptions   Medication Sig   . apixaban (ELIQUIS) 5 mg Oral Tablet Take 1 Tab (5 mg total) by mouth Twice daily   . aspirin (ECOTRIN) 81 mg Oral Tablet, Delayed Release (E.C.) Take 81 mg by mouth Once a day   . carvedilol (COREG) 6.25 mg Oral Tablet Take 6.25 mg by mouth Twice daily with food   . cholecalciferol, vitamin D3, 1,000 unit Oral Tablet Take 1,000 Units by mouth Once a day   . Coenzyme Q10 (CO Q-10) 10 mg Oral Capsule Take by mouth   . donepezil (ARICEPT) 10 mg Oral Tablet Take 10 mg by mouth Every night   . erythromycin (ROMYCIN) 5 mg/gram (0.5 %) Ophthalmic Ointment Four times a day   . Ferrous Sulfate 142  mg (45 mg iron) Oral Tablet Sustained Release Take by mouth   . fluticasone propionate (FLUTICASONE NASL) by Nasal route   . folic acid (FOLVITE) 1 mg Oral Tablet Take 1 mg by mouth Once a day   . insulin glargine (LANTUS) 100 unit/mL Subcutaneous injection (vial) 15 Units by Subcutaneous route Every night   . insulin R human (HUMULIN R) 100 units/mL Subcutaneous Injectable 3 Units by Subcutaneous route Four times a day as needed   . isosorbide mononitrate (IMDUR) 30 mg Oral  Tablet Sustained Release 24 hr Take 1 Tab (30 mg total) by mouth Once a day   . nitroGLYCERIN (NITROSTAT) 0.4 mg Sublingual Tablet, Sublingual 0.4 mg by Sublingual route Every 5 minutes as needed for Chest pain for 3 doses over 15 minutes   . omega-3-DHA-EPA-fish oil (FISH OIL) 1,000 mg (120 mg-180 mg) Oral Capsule Take by mouth Once a day   . polyethylene glycol (MIRALAX) 17 gram Oral Powder in Packet Take 1 Packet (17 g total) by mouth Once per day as needed for Other (constipation)   . potassium chloride (K-DUR) 20 mEq Oral Tab Sust.Rel. Particle/Crystal Take 20 mEq by mouth Once a day   . raNITIdine (ZANTAC) 150 mg Oral Tablet Take 150 mg by mouth Twice daily   . sennosides-docusate sodium (SENOKOT-S) 8.6-50 mg Oral Tablet Take 1 Tab by mouth Twice daily   . torsemide (DEMADEX) 20 mg Oral Tablet Take 2 Tabs (40 mg total) by mouth Once a day     Allergies   Allergen Reactions   . Lipitor [Atorvastatin]      Concerned about cognitive function.   . Pravachol [Pravastatin] Rash   . Amiodarone      Past Medical History:   Diagnosis Date   . Automatic implantable cardioverter-defibrillator in situ    . BPH (benign prostatic hypertrophy)    . CAD (coronary artery disease) 09/25/2011   . Cataract    . CHF (NYHA class II, ACC/AHA stage C) (CMS HCC) 10/24/2013   . CVA (cerebrovascular accident) (CMS HCC)    . Dementia    . Diabetes mellitus (CMS HCC)    . Diabetes mellitus, type 2 (CMS HCC)    . Disorder of prostate    . Esophageal reflux    . HTN (hypertension)    . MI (myocardial infarction) (CMS HCC)    . Pacemaker    . Renal stones    . Stented coronary artery    . Type 2 diabetes mellitus (CMS HCC)    . Wears dentures    . Wears glasses          Past Surgical History:   Procedure Laterality Date   . CORONARY ARTERY ANGIOPLASTY     . HX ADENOIDECTOMY     . HX APPENDECTOMY     . HX CARDIAC ABLATION     . HX HEART CATHETERIZATION     . HX LITHOTRIPSY     . HX PACEMAKER DEFIBRILLATOR PLACEMENT     . HX TONSILLECTOMY            Family History  Family Medical History:     Problem Relation (Age of Onset)    Cancer Sister    Coronary Artery Disease Other    Diabetes Mother, Sister    High Cholesterol Father    Hypertension Father            Social History  Social History     Social History   . Marital  status: Divorced     Spouse name: [name]/A   . Number of children: [name]/A   . Years of education: [name]/A     Social History Main Topics   . Smoking status: Never Smoker   . Smokeless tobacco: Never Used   . Alcohol use No   . Drug use: No   . Sexual activity: Yes     Birth control/ protection: Condom     Other Topics Concern   . Right Hand Dominant Yes   . Ability To Walk 2 Flight Of Steps Without Sob/Cp Yes     Social History Narrative       REVIEW OF SYSTEMS    Constitutional: negative for weight gain  Eyes: negative for visual disturbance  Ears, nose, mouth, throat, and face: positive for nasal congestion  Respiratory: positive for cough or dyspnea on exertion  Cardiovascular: positive for dyspnea and orthopnea, negative for chest pain, chest pressure/discomfort, palpitations, near-syncope, syncope and lower extremity edema  Gastrointestinal: negative for melena  Genitourinary:negative for hematuria  Hematologic/lymphatic: negative for bleeding  Neurological: negative for dizziness  All other review of systems were negative.       EXAM:  BP (!) 109/57  Pulse 83  Resp 18  Ht 1.829 m (6')  Wt 76.7 kg (169 lb)  SpO2 95%  BMI 22.92 kg/m2      Constitutional: appears chronically ill, appears stated age and no distress  Neck: No JVD and supple, symmetrical, trachea midline  Respiratory: clear to auscultation bilaterally.  and thoracic excursion   normal  Cardiovascular:    Heart regular rate and rhythm, S1, S2 normal, no murmur, click, rub or gallop    Vascular No carotid bruits    Chest wall Normal   Extremities: no cyanosis or edema  Gastrointestinal: soft, non-tender and bowel sounds normal  Skin: Skin warm and dry and No rashes  Neurologic: alert  and oriented x3  Psychiatric: affect normal, behavior normal and speech normal    ASSESSMENT AND PLAN:    ICD-10-CM    1. CAD (coronary artery disease): Cardiac catheterization May 19, 2017 with critical stenosis of the proximal and mid LAD, total occlusion of the diagonal branch of the LAD, occluded small obtuse marginal branch of the circumflex, and critical stenosis of the mid RCA. No anginal symptoms. We will continue with Aspirin 81 mg daily, Coreg 6.25 mg twice daily, and Imdur 30 mg daily. He will follow up in 6 weeks, and we will discuss proceeding forward with high risk PCI if he should have any worsened symptoms of angina. I25.10    2. Ischemic cardiomyopathy: EF 27% per TTE completed May 20, 2017. His symptoms have improved since he was hospitalized, and his weight is stable at 169 pounds. He appears euvolemic on exam today. I25.5    3. Atrial fibrillation (CMS [facility]): He remains off of Amiodarone due to history of Amiodarone toxicity. His rate is well-controlled with Coreg 6.25 mg twice daily. He remains on thromboembolic prophylaxis with Eliquis 5 mg twice daily.  I48.91    4. S/P AV nodal ablation: s/p BiV ICD Z98.890    5. 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 On Demadex 40 mg daily.  I27.20          No orders of the defined types were placed in this encounter.      Return in about 6 weeks (around 07/30/2017), or if symptoms worsen or fail to improve.    Ashlee O'Kernick, PA-C  [employer]  Department of Medicine, Section of Cardiology    Vincent Gros, MD      I have seen and examined this patient with Ashlee O'Kernick, PA-C.  I reviewed and edited the note.  I agree with the findings.  Patient with severe ischemic cardiomyopathy and recent hospitalization for NYHA class 4 symptoms.  A repeat cardiac catheterization suggested significant coronary artery disease.  However due to his general condition with elevated creatinine we elected to manage him medically.  I will reassess him  in about 6 weeks time if his general condition improves then May consider to do PCI.

## 2017-07-03 ENCOUNTER — Encounter (HOSPITAL_COMMUNITY): Payer: Self-pay

## 2017-07-30 ENCOUNTER — Ambulatory Visit (INDEPENDENT_AMBULATORY_CARE_PROVIDER_SITE_OTHER): Payer: Medicare Other | Admitting: Internal Medicine

## 2017-07-30 ENCOUNTER — Encounter (INDEPENDENT_AMBULATORY_CARE_PROVIDER_SITE_OTHER): Payer: Self-pay | Admitting: Internal Medicine

## 2017-07-30 VITALS — BP 83/50 | HR 69 | Resp 18 | Ht 72.0 in | Wt 170.4 lb

## 2017-07-30 DIAGNOSIS — Z9889 Other specified postprocedural states: Secondary | ICD-10-CM

## 2017-07-30 DIAGNOSIS — I255 Ischemic cardiomyopathy: Secondary | ICD-10-CM

## 2017-07-30 DIAGNOSIS — I4891 Unspecified atrial fibrillation: Secondary | ICD-10-CM

## 2017-07-30 DIAGNOSIS — I2510 Atherosclerotic heart disease of native coronary artery without angina pectoris: Secondary | ICD-10-CM

## 2017-07-30 DIAGNOSIS — I272 Pulmonary hypertension, unspecified: Secondary | ICD-10-CM

## 2017-07-30 MED ORDER — CARVEDILOL 3.125 MG TABLET
3.1250 mg | ORAL_TABLET | Freq: Two times a day (BID) | ORAL | 3 refills | Status: AC
Start: 2017-07-30 — End: ?

## 2017-07-30 NOTE — Progress Notes (Signed)
[place]  [address]  [place] [address]  Phone: [phone]  Fax: [phone]    Encounter Date: 07/30/2017    Patient ID:  [name]  MVH:[id number]    DOB:   Age: 77 y.o. male    Subjective:     Chief Complaint   Patient presents with   . FOLLOWUP INDIVIDUAL THERAPY-MED CHECK   . ED Follow-up   . Dizziness     HPI   [name] is a pleasant 77 y.o. male presenting for follow up. He was hospitalized at [facility] November 26th with acute on chronic systolic CHF exacerbation with a BNP of 1571. Prior to his hospitalization, his weight had increased from 168 pounds to 184 pounds. 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 He recently underwent right and left heart catheterization on May 19, 2017 with Dr. Lyndee Leo at [facility] and was found to have critical stenosis of the proximal and mid LAD, patent stent to the mid LAD, totally occluded diagonal branch that was the 2nd diagonal branch of the LAD, occluded small obtuse marginal branch of the circumflex, critical stenosis involving the mid RCA and post capillary pulmonary hypertension. He was transferred to [facility] for further management of his heart failure and was diuresed with furosemide IV and metolazone. He was determined to be a poor candidate for high risk PCI given his comorbidities. A transthoracic echocardiogram that was completed on 05/20/2017 showed global left ventricular hypokinesis with an EF 27% and mild mitral regurgitation. He was managed conservatively with medical therapy and was discharged to [facility] on increased Demadex 40 mg once daily. In regards to his atrial fibrillation, Digoxin was discontinued secondary to  his chronic kidney disease with a baseline serum creatinine around 2.5. He remains off of Amiodarone due to concerns for Amiodarone toxicity. He remains on chronic anticoagulation with Eliquis 5 mg twice daily for thromboembolic prophylaxis. He states that he feels better than he has in months, and his weight has remained stable. He does report some dizziness, and his blood pressure is quite low today at 83/50. He denies chest pain/pressure/tightness, palpitations, n/v, abdominal pain, bleeding complications, edema, and syncope. No TIA or stroke-like symptoms.        Current Outpatient Medications   Medication Sig   . apixaban (ELIQUIS) 5 mg Oral Tablet Take 1 Tab (5 mg total) by mouth Twice daily   . aspirin (ECOTRIN) 81 mg Oral Tablet, Delayed Release (E.C.) Take 81 mg by mouth Once a day   . carvedilol (COREG) 3.125 mg Oral Tablet Take 1 Tab (3.125 mg total) by mouth Twice daily with food   . cholecalciferol, vitamin D3, 1,000 unit Oral Tablet Take 1,000 Units by mouth Once a day   . Coenzyme Q10 (CO Q-10) 10 mg Oral Capsule Take by mouth   . donepezil (ARICEPT) 10 mg Oral Tablet Take 10 mg by mouth Every night   . erythromycin (ROMYCIN) 5 mg/gram (0.5 %) Ophthalmic Ointment Four times a day   . Ferrous Sulfate 142 mg (45 mg iron) Oral Tablet Sustained Release Take by mouth   . fluticasone propionate (FLUTICASONE NASL) by Nasal route   . folic acid (FOLVITE) 1 mg Oral Tablet Take 1 mg by  mouth Once a day   . insulin glargine (LANTUS) 100 unit/mL Subcutaneous injection (vial) 15 Units by Subcutaneous route Every night   . insulin R human (HUMULIN R) 100 units/mL Subcutaneous Injectable 3 Units by Subcutaneous route Four times a day as needed   . isosorbide mononitrate (IMDUR) 30 mg Oral Tablet Sustained Release 24 hr Take 1 Tab (30 mg total) by mouth Once a day   . nitroGLYCERIN (NITROSTAT) 0.4 mg Sublingual Tablet, Sublingual 0.4 mg by Sublingual route Every 5 minutes as needed for Chest pain for 3 doses over 15  minutes   . omega-3-DHA-EPA-fish oil (FISH OIL) 1,000 mg (120 mg-180 mg) Oral Capsule Take by mouth Once a day   . polyethylene glycol (MIRALAX) 17 gram Oral Powder in Packet Take 1 Packet (17 g total) by mouth Once per day as needed for Other (constipation)   . potassium chloride (K-DUR) 20 mEq Oral Tab Sust.Rel. Particle/Crystal Take 20 mEq by mouth Once a day   . raNITIdine (ZANTAC) 150 mg Oral Tablet Take 150 mg by mouth Twice daily   . sennosides-docusate sodium (SENOKOT-S) 8.6-50 mg Oral Tablet Take 1 Tab by mouth Twice daily   . spironolactone (ALDACTONE) 50 mg Oral Tablet Take 50 mg by mouth Every morning with breakfast   . torsemide (DEMADEX) 20 mg Oral Tablet Take 2 Tabs (40 mg total) by mouth Once a day     Allergies   Allergen Reactions   . Lipitor [Atorvastatin]      Concerned about cognitive function.   . Pravachol [Pravastatin] Rash   . Amiodarone      Past Medical History:   Diagnosis Date   . Automatic implantable cardioverter-defibrillator in situ    . BPH (benign prostatic hypertrophy)    . CAD (coronary artery disease) 09/25/2011   . Cataract    . CHF (NYHA class II, ACC/AHA stage C) (CMS HCC) 10/24/2013   . CVA (cerebrovascular accident) (CMS [place])    . Dementia    . Diabetes mellitus (CMS [place])    . Diabetes mellitus, type 2 (CMS HCC)    . Disorder of prostate    . Esophageal reflux    . HTN (hypertension)    . MI (myocardial infarction) (CMS [place])    . Pacemaker    . Renal stones    . Stented coronary artery    . Type 2 diabetes mellitus (CMS HCC)    . Wears dentures    . Wears glasses          Past Surgical History:   Procedure Laterality Date   . CORONARY ARTERY ANGIOPLASTY     . HX ADENOIDECTOMY     . HX APPENDECTOMY     . HX CARDIAC ABLATION     . HX HEART CATHETERIZATION     . HX LITHOTRIPSY     . HX PACEMAKER DEFIBRILLATOR PLACEMENT     . HX TONSILLECTOMY           Family Medical History:     Problem Relation (Age of Onset)    Cancer Sister    Coronary Artery Disease Other    Diabetes Mother,  Sister    High Cholesterol Father    Hypertension Father            Social History     Tobacco Use   . Smoking status: Never Smoker   . Smokeless tobacco: Never Used   Substance Use Topics   . Alcohol use: No   .  Drug use: No       Review of Systems   Constitutional: Negative for activity change, appetite change, chills, diaphoresis, fatigue, fever and unexpected weight change.   HENT: Negative for hearing loss.    Respiratory: Negative for apnea, cough, chest tightness, shortness of breath and wheezing.    Cardiovascular: Negative for chest pain, palpitations and leg swelling.   Gastrointestinal: Negative for abdominal pain and blood in stool.   Genitourinary: Negative for hematuria.   Musculoskeletal: Negative for back pain and myalgias.   Skin: Negative for color change and rash.   Neurological: Positive for dizziness. Negative for syncope.   Hematological: Does not bruise/bleed easily.   All other systems reviewed and are negative.    Objective:   Vitals: BP (!) 83/50   Pulse 69   Resp 18   Ht 1.829 m (6')   Wt 77.3 kg (170 lb 6.4 oz)   SpO2 100%   BMI 23.11 kg/m         Physical Exam   Constitutional: He is oriented to person, place, and time. He appears well-developed and well-nourished. No distress.   HENT:   Head: Normocephalic and atraumatic.   Eyes: No scleral icterus.   Neck: Normal range of motion. Neck supple. No JVD present.   Cardiovascular: Normal rate, regular rhythm, normal heart sounds and intact distal pulses. Exam reveals no gallop and no friction rub.   No murmur heard.  Pulmonary/Chest: Effort normal and breath sounds normal. No respiratory distress. He has no wheezes. He has no rales. He exhibits no tenderness.   Abdominal: Soft. Bowel sounds are normal. There is no tenderness.   Musculoskeletal: Normal range of motion. He exhibits no edema.   Neurological: He is alert and oriented to person, place, and time.   Skin: Skin is warm and dry. No rash noted. He is not diaphoretic. No  erythema.   Psychiatric: He has a normal mood and affect. His behavior is normal.   Nursing note and vitals reviewed.    Blood work 07/21/17 with Na 133, K 4.1, BUN 35, Cr 2.3, eGFR 28 and glucose 81.     Assessment & Plan:     ENCOUNTER DIAGNOSES     ICD-10-CM   1. CAD (coronary artery disease): Cardiac catheterization May 19, 2017 with critical stenosis of the proximal and mid LAD, total occlusion of the diagonal branch of the LAD, occluded small obtuse marginal branch of the circumflex, and critical stenosis of the mid RCA. No anginal symptoms. We will decrease Coreg to 3.125 mg twice daily due to hypotension. We will continue with ASA 81 mg daily and Imdur 30 mg daily. He will follow up in 3 months.  I25.10   2. Ischemic cardiomyopathy: EF 27% per TTE completed May 20, 2017. His weight is stable at 170 pounds. He appears euvolemic on exam today. We will continue with Demadex 40 mg daily and Aldactone 50 mg daily and obtain a repeat BMP.  I25.5   3. Atrial fibrillation (CMS [facility]): He remains off of Amiodarone due to history of Amiodarone toxicity. Coreg decreased to 3.125 mg twice daily due to hypotension. He remains on thromboembolic prophylaxis with Eliquis 5 mg twice daily without bleeding complications.  I48.91   4. S/P AV nodal ablation: s/p BiV ICD Z98.890   5. 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 On Demadex 40 mg daily and Aldactone 50 mg daily.  I27.20       Orders Placed This Encounter   .  BASIC METABOLIC PANEL, FASTING   . carvedilol (COREG) 3.125 mg Oral Tablet       Return in about 3 months (around 10/28/2017), or if symptoms worsen or fail to improve.    Ashlee O'Kernick, PA-C    I have seen and evaluated the patient jointly with Ashlee O'Kernick, PA-C and agree with her assessment, recommendations and plans.  Coronary artery disease with significant obstructive disease by recent cardiac catheterization, severe ischemic cardiomyopathy with improvement in symptoms.  Patient  was accompanied by his daughter he has an underlying significant chronic CKD, discussed options will continue with medical treatment at this point with no intervention will reassess in 3 months.      Zella Ball, MD, [facility], [facility]  Interventional Cardiologist  Associate Professor   [facility] Department of Medicine, Section of Cardiology     08/03/2017 13:08

## 2017-07-30 NOTE — Patient Instructions (Addendum)
Decrease Coreg to 3.125 mg twice daily. A new prescription has been sent to your pharmacy.

## 2017-08-03 ENCOUNTER — Other Ambulatory Visit (INDEPENDENT_AMBULATORY_CARE_PROVIDER_SITE_OTHER): Payer: Self-pay

## 2017-08-03 DIAGNOSIS — I2510 Atherosclerotic heart disease of native coronary artery without angina pectoris: Secondary | ICD-10-CM

## 2017-08-03 DIAGNOSIS — I255 Ischemic cardiomyopathy: Secondary | ICD-10-CM

## 2017-08-03 DIAGNOSIS — I4891 Unspecified atrial fibrillation: Secondary | ICD-10-CM

## 2017-08-18 ENCOUNTER — Encounter (INDEPENDENT_AMBULATORY_CARE_PROVIDER_SITE_OTHER): Payer: Self-pay | Admitting: Nurse Practitioner

## 2017-08-18 ENCOUNTER — Ambulatory Visit (INDEPENDENT_AMBULATORY_CARE_PROVIDER_SITE_OTHER): Payer: Medicare Other | Admitting: Nurse Practitioner

## 2017-08-18 VITALS — BP 110/74 | HR 78 | Resp 15 | Ht 72.0 in | Wt 189.0 lb

## 2017-08-18 DIAGNOSIS — E1122 Type 2 diabetes mellitus with diabetic chronic kidney disease: Secondary | ICD-10-CM

## 2017-08-18 DIAGNOSIS — N183 Chronic kidney disease, stage 3 (moderate): Secondary | ICD-10-CM

## 2017-08-18 LAB — ENTER/EDIT NEPHROLOGY EXTERNAL LABS
BUN/CREAT RATIO: 21.25
BUN: 51
CALCIUM: 9.1
CARBON DIOXIDE: 22
CHLORIDE: 101
CREATININE: 2.4
ESTIMATED GLOMERULAR FILTRATION RATE: 26
GLUCOSE, FASTING: 78
POTASSIUM: 4.3
POTASSIUM: 5.6
SODIUM: 134

## 2017-08-18 MED ORDER — SODIUM BICARBONATE 650 MG TABLET: 650 mg | Tab | Freq: Two times a day (BID) | ORAL | 2 refills | 0 days | Status: DC

## 2017-08-18 NOTE — Progress Notes (Signed)
 [place]  [address]  [place], [place]  [address]  [phone]    Progress Note  Date of Service: 08/18/2017  Name of Patient:  [name]  MRN:  [id number]  DOB:        S:  Jerilynn Birkenhead Mottern is here today for follow up of CKD. He is accompanied by a patient assistant from [facility] where he resides. The patient is a 77 year old with history of CAD s/p PCI, ischemic cardiomyopathy, implanted ICD, systolic and diastolic HF, atrial fibrillation, AV nodal ablation, hypertension, hyperlipidemia, DM II, dementia, BPH, and remote history of kidney stones. Since his last visit in July, he has been hospitalized twice for acute CHF. In November he was admitted to [facility]. He had a right and left heart cath with findings of severe multivessel CAD. He was transferred to [facility] and was determined a poor candidate for high risk intervention. He was treated for his fluid overload with furosemide drip and transitioned to oral  torsemide. Losartan, digoxin, and metformin were discontinued. Baseline creatinine prior to hospitalization was 1.8 - 1.9, and increased to 2.6. Less than two weeks after discharge from [facility], he was readmitted to [facility] with CHF exacerbation and again diuresed.   He feels well today. He denies chest pain, palpitations, dyspnea, orthopnea, PND or lower extremity swelling. He is on fluid and sodium restriction. His appetite is good. He denies nausea or vomiting. He has no dysuria, hematuria, or flank pain. His blood pressure has been good, sometimes "a little low". He denies dizziness, lightheadedness or recent falls. His blood glucose levels have also been good, 130 this morning per his account.     Allergies:    Allergies   Allergen Reactions   . Lipitor [Atorvastatin]      Concerned about cognitive function.   . Pravachol [Pravastatin] Rash   . Amiodarone        Medications:   Current Outpatient Medications   Medication Sig   . apixaban (ELIQUIS) 5 mg Oral Tablet Take 1  Tab (5 mg total) by mouth Twice daily   . aspirin (ECOTRIN) 81 mg Oral Tablet, Delayed Release (E.C.) Take 81 mg by mouth Once a day   . carvedilol (COREG) 3.125 mg Oral Tablet Take 1 Tab (3.125 mg total) by mouth Twice daily with food   . cholecalciferol, vitamin D3, 1,000 unit Oral Tablet Take 1,000 Units by mouth Once a day   . Coenzyme Q10 (CO Q-10) 10 mg Oral Capsule Take by mouth   . donepezil (ARICEPT) 10 mg Oral Tablet Take 10 mg by mouth Every night   . erythromycin (ROMYCIN) 5 mg/gram (0.5 %) Ophthalmic Ointment Four times a day   . Ferrous Sulfate 142 mg (45 mg iron) Oral Tablet Sustained Release Take by mouth   . fluticasone propionate (FLUTICASONE NASL) by Nasal route   . folic acid (FOLVITE) 1 mg Oral Tablet Take 1 mg by mouth Once a day   . insulin glargine (LANTUS) 100 unit/mL Subcutaneous injection (vial) 15 Units by Subcutaneous route Every night   . insulin R human (HUMULIN R) 100 units/mL Subcutaneous Injectable 3 Units by Subcutaneous route Four times a day as needed   . isosorbide mononitrate (IMDUR) 30 mg Oral Tablet Sustained Release 24 hr Take 1 Tab (30 mg total) by mouth Once a day   . nitroGLYCERIN (NITROSTAT) 0.4 mg Sublingual Tablet, Sublingual 0.4 mg by Sublingual route Every 5 minutes as needed for Chest pain  for 3 doses over 15 minutes   . omega-3-DHA-EPA-fish oil (FISH OIL) 1,000 mg (120 mg-180 mg) Oral Capsule Take by mouth Once a day   . polyethylene glycol (MIRALAX) 17 gram Oral Powder in Packet Take 1 Packet (17 g total) by mouth Once per day as needed for Other (constipation)   . potassium chloride (K-DUR) 20 mEq Oral Tab Sust.Rel. Particle/Crystal Take 20 mEq by mouth Once a day   . raNITIdine (ZANTAC) 150 mg Oral Tablet Take 150 mg by mouth Twice daily   . sennosides-docusate sodium (SENOKOT-S) 8.6-50 mg Oral Tablet Take 1 Tab by mouth Twice daily   . spironolactone (ALDACTONE) 50 mg Oral Tablet Take 50 mg by mouth Every morning with breakfast   . torsemide (DEMADEX) 20 mg  Oral Tablet Take 2 Tabs (40 mg total) by mouth Once a day       LABS:   Office Visit on 08/18/2017   Component Date Value Ref Range Status   . SODIUM 07/31/2017 134   Final   . POTASSIUM 07/31/2017 5.6   Final   . CHLORIDE 07/31/2017 101   Final   . CARBON DIOXIDE 07/31/2017 22   Final   . BUN 07/31/2017 51   Final   . CREATININE 07/31/2017 2.4   Final   . BUN/CREAT RATIO 07/31/2017 21.25   Final   . ESTIMATED GLOMERULAR FILTRATION RA* 07/31/2017 26   Final   . GLUCOSE, FASTING 07/31/2017 78   Final   . CALCIUM 07/31/2017 9.1   Final   . POTASSIUM 08/03/2017 4.3   Final   Admission on 05/20/2017, Discharged on 05/27/2017   No results displayed because visit has over 200 results.      Admission on 05/19/2017, Discharged on 05/20/2017   Component Date Value Ref Range Status   . GLUCOSE, POC 05/19/2017 73  70 - 110 mg/dL Final   . %FIO2 (ARTERIAL) 05/19/2017 32  % Final   . PH (ARTERIAL) 05/19/2017 7.42  7.35 - 7.45 Final   . PCO2 (ARTERIAL) 05/19/2017 38.0  35.0 - 48.0 mm/Hg Final   . PO2 (ARTERIAL) 05/19/2017 96.0  80.0 - 100.0 mm/Hg Final   . BICARBONATE (ARTERIAL) 05/19/2017 25.1* 22.0-28.0 mmol/L mmol/L Final   . BASE EXCESS (ARTERIAL) 05/19/2017 0.3  -2.0 - 2.0 mmol/L Final   . O2 SATURATION (ARTERIAL) 05/19/2017 99.2* 95.0 - 98.0 % Final   . TCO2 05/19/2017 25.8   Final   . PH (T) 05/19/2017 7.42   Final   . PCO2 (ARTERIAL - TEMP CORRECTED) 05/19/2017 38.0  mm/Hg Final   . PO2 (ARTERIAL - TEMP CORRECTED 05/19/2017 96   Final   . TEMPERATURE, COMP 05/19/2017 37.0  15.0 - 45.0 C Final   . HEMOGLOBIN 05/19/2017 13.4  g/dL Final   . CARBOXYHEMOGLOBIN 05/19/2017 2.7  0.0 - 3.0 % Final   . MET-HEMOGLOBIN 05/19/2017 1.0  0.0 - 1.5 % Final   . OXYHEMOGLOBIN 05/19/2017 95.6  95.0 - 98.0 % Final   . CARBOXYHEMOGLOBIN 05/19/2017 1.9  0.0 - 3.0 % Final   . %FIO2 (VENOUS) 05/19/2017 32.0  % Final   . HEMOGLOBIN 05/19/2017 13.3  g/dL Final   . BICARBONATE (VENOUS) 05/19/2017 24.6  23.0 - 33.0 mmol/L Final   . MET-HEMOGLOBIN  05/19/2017 1.1  0.0 - 1.5 % Final   . OXYHEMOGLOBIN 05/19/2017 42.5* 95.0 - 98.0 % Final   . PCO2 (VENOUS) 05/19/2017 43.00  38.00 - 50.00 mm/Hg Final   . PH (TEMPERATURE CORRECTED) 05/19/2017  7.4   Final   . PO2 (VENOUS - TEMP CORRECTED) 05/19/2017 26.0  mm/Hg Final   . TEMPERATURE, COMP 05/19/2017 37.0  15.0 - 45.0 C Final   . O2 SATURATION (VENOUS) 05/19/2017 43.8* 95.0 - 98.0 % Final   . PH (VENOUS) 05/19/2017 7.40  7.32 - 7.45 Final   . PO2 (VENOUS) 05/19/2017 26.0* 30.0 - 50.0 mm/Hg Final   . BNP 05/19/2017 1,420* 0 - 100 pg/mL Final   . SODIUM 05/19/2017 142  135 - 145 mmol/L Final   . POTASSIUM 05/19/2017 4.5  3.5 - 5.0 mmol/L Final   . CHLORIDE 05/19/2017 106  98 - 111 mmol/L Final   . CO2 TOTAL 05/19/2017 25  21 - 35 mmol/L Final   . ANION GAP 05/19/2017 11  mmol/L Final   . CALCIUM 05/19/2017 9.2  8.2 - 10.2 mg/dL Final   . GLUCOSE 05/19/2017 126* 70 - 110 mg/dL Final   . BUN 05/19/2017 33* 10 - 25 mg/dL Final   . CREATININE 05/19/2017 1.97* <=1.30 mg/dL Final   . BUN/CREA RATIO 05/19/2017 17   Final   . ESTIMATED GFR 05/19/2017 33* Avg: 75 mL/min/1.30m2 Final   . WBC 05/19/2017 6.6  3.3 - 9.3 x10^3/uL Final   . RBC 05/19/2017 4.60  4.40 - 5.68 x10^6/uL Final   . HGB 05/19/2017 14.1  13.4 - 17.3 g/dL Final   . HCT 05/19/2017 42.6  38.9 - 50.5 % Final   . MCV 05/19/2017 92.7  82.4 - 95.0 fL Final   . MCH 05/19/2017 30.8  27.9 - 33.1 pg Final   . MCHC 05/19/2017 33.2  32.8 - 36.0 g/dL Final   . RDW 05/19/2017 15.7* 10.9 - 15.1 % Final   . PLATELETS 05/19/2017 156  140 - 440 x10^3/uL Final   . MPV 05/19/2017 9.0  6.0 - 10.2 fL Final   . NEUTROPHIL % 05/19/2017 73  % Final   . LYMPHOCYTE % 05/19/2017 15  % Final   . MONOCYTE % 05/19/2017 9  % Final   . EOSINOPHIL % 05/19/2017 2  % Final   . BASOPHIL % 05/19/2017 1  % Final   . NEUTROPHIL # 05/19/2017 4.80  1.60 - 5.50 x10^3/uL Final   . LYMPHOCYTE # 05/19/2017 1.00  0.80 - 3.20 x10^3/uL Final   . MONOCYTE # 05/19/2017 0.60  0.20 - 0.80 x10^3/uL Final   .  EOSINOPHIL # 05/19/2017 0.10  0.00 - 0.50 x10^3/uL Final   . BASOPHIL # 05/19/2017 0.00  0.00 - 0.20 x10^3/uL Final   . GLUCOSE, POC 05/19/2017 169* 70 - 110 mg/dL Final   . SODIUM 05/20/2017 140  135 - 145 mmol/L Final   . POTASSIUM 05/20/2017 3.9  3.5 - 5.0 mmol/L Final   . CHLORIDE 05/20/2017 106  98 - 111 mmol/L Final   . CO2 TOTAL 05/20/2017 23  21 - 35 mmol/L Final   . ANION GAP 05/20/2017 11  mmol/L Final   . CALCIUM 05/20/2017 9.3  8.2 - 10.2 mg/dL Final   . GLUCOSE 05/20/2017 95  70 - 110 mg/dL Final   . BUN 05/20/2017 30* 10 - 25 mg/dL Final   . CREATININE 05/20/2017 1.92* <=1.30 mg/dL Final   . BUN/CREA RATIO 05/20/2017 16   Final   . ESTIMATED GFR 05/20/2017 34* Avg: 75 mL/min/1.742m Final   . PHOSPHORUS 05/20/2017 3.6  2.5 - 4.5 mg/dL Final   . MAGNESIUM 05/20/2017 2.0  1.8 - 2.3 mg/dL Final   . INR  05/20/2017 1.66* 0.90 - 1.10 Final   . WBC 05/20/2017 8.0  3.3 - 9.3 x10^3/uL Final   . RBC 05/20/2017 4.82  4.40 - 5.68 x10^6/uL Final   . HGB 05/20/2017 15.2  13.4 - 17.3 g/dL Final   . HCT 05/20/2017 45.2  38.9 - 50.5 % Final   . MCV 05/20/2017 93.7  82.4 - 95.0 fL Final   . MCH 05/20/2017 31.6  27.9 - 33.1 pg Final   . MCHC 05/20/2017 33.7  32.8 - 36.0 g/dL Final   . RDW 05/20/2017 15.9* 10.9 - 15.1 % Final   . PLATELETS 05/20/2017 190  140 - 440 x10^3/uL Final   . MPV 05/20/2017 9.0  6.0 - 10.2 fL Final   . NEUTROPHIL % 05/20/2017 69  % Final   . LYMPHOCYTE % 05/20/2017 17  % Final   . MONOCYTE % 05/20/2017 11  % Final   . EOSINOPHIL % 05/20/2017 2  % Final   . BASOPHIL % 05/20/2017 1  % Final   . NEUTROPHIL # 05/20/2017 5.60* 1.60 - 5.50 x10^3/uL Final   . LYMPHOCYTE # 05/20/2017 1.40  0.80 - 3.20 x10^3/uL Final   . MONOCYTE # 05/20/2017 0.90* 0.20 - 0.80 x10^3/uL Final   . EOSINOPHIL # 05/20/2017 0.10  0.00 - 0.50 x10^3/uL Final   . BASOPHIL # 05/20/2017 0.10  0.00 - 0.20 x10^3/uL Final     Labs today, 08/18/17 [facility]:    Sodium 132, potassium 4.0, chloride 102, bicarb 18, glucose 199,  BUN 40 creatinine 1.8, eGFR 37, calcium 8.6, anion 12, B/C 22.22.       PHYSICAL EXAM:  BP 110/74   Pulse 78   Resp 15   Ht 1.829 m (6')   Wt 85.7 kg (189 lb)   BMI 25.63 kg/m    General:  Pleasant elderly Caucasian male who appears well nourished and in no distress.   HEENT:  Normocephalic. No conjunctival pallor or scleral icterus. Moist mucous membranes.   Neck:  Supple, no JVD  Heart:  RRR, no murmurs  Lungs:  Clear to auscultation, decreased air movement left base. No wheezes or rales. Normal respiratory effort  Abdomen:  Soft, nondistended. Bowel sounds present.   Extremities:  No lower extremity edema. Peripheral pulses palpable.   Skin:  Warm and dry, no pallor or jaundice.   Neuro:  Alert, oriented x 4. Gait steady. No gross focal deficit   Psych:  Happy, appropriate mood and affect.       ASSESSMENT/PLAN:    1.  Chronic Kidney Disease, eGFR estimates stage III with multiple possible etiologies including HTN, atherosclerosis, chronic heart failure, diabetes and ageing. Recent AKI due to cardiorenal syndrome, aggressive diuresis, and IV contrast exposure - resolved and creatinine back to baseline at 1.8 today. Electrolytes stable - recent hyperkalemia but also has had hypokalemia and is on oral potassium as well as spironolactone. Volume status appears stable with no overt signs or symptoms of fluid overload, however he has had significant weight gain, 189 lbs today and was 160 - 165 lbs when discharged from [facility] in November. Continue daily torsemide 40 mg and increase to twice daily for weight gain of 3 pounds or more. Avoid all NSAIAs and other nephrotoxic drugs / agents. Renal dose medications when indicated. Advise quarterly blood and urine testing (see below) for CKD complications that tend to evolve during Stage 3 or by later CKD stages (mineral metabolism abnormalities and anemia related to CKD).  2. Hypertension, controlled. Continue current medications.     3.  DM II, uncontrolled. HgbA1c  7.6 with non-nephrotic range proteinuria. Advise HgbA1c below 7.0 to prevent or delay progression of microvascular and neuropathic complications of diabetes, including diabetic kidney disease.     4.  Anemia and iron status - history of mild anemia, improved on ferrous sulfate - continue. Last available iron levels normal.     5.  Secondary hyperparathyroidism - iPTH slightly above target in November - will repeat with next labs. Calcium, phosphorus and Vitamin D at target. Continue cholecalciferol. Vitamin D 41 OH with next labs.     6.  Metabolic acidosis - add sodium bicarbonate daily.     7.  Recent hyperkalemia treated with Kayexalate, resolved and serum potassium today 4.0. Also history of hypokalemia on oral potassium. He is also on spironolactone - monitor serum potassium closely.     8.  Systolic and diastolic heart failure, currently compensated but with increasing weight gain. Continue fluid restriction, sodium restriction and daily weights. Continue torsemide 40 mg daily and give additional 40 mg daily for weight gain 3 lb or more. If he continues to gain weight and does not respond to additional dose of torsemide please contact this office or cardiology for recommendations.     9.  Return in 3 months for follow up    The patient was seen independently.    Maris Berger APRN, FNP-BC  [place] [facility] of Medicine  Section of Nephrology  ______________________________________________  Quarterly Lab Tests:    CBC with differential  Magnesium  Calcium  Phosphorus  Albumin  Electrolytes, BUN, creatinine  Iron, Fe Sat, TIBC  IPTH  Vitamin D 25 0H  Hgb A1C    Random urine protein  Random urine albumin  Random urine creatinine    Dx: Chronic Kidney Disease N18  Dx: Diabetes E11.9    Please FAX results to:    Maris Berger FNP-BC  [facility]   Department of Nephrology  [facility]  Fax: [phone]

## 2017-08-18 NOTE — Progress Notes (Deleted)
[facility]  [address]  [place], [place]  [address]  [phone]    Progress Note  Date of Service: 08/18/2017  Name of Patient:  [name]  MRN:  [id number]  DOB:        S:  [name]     This is a 77 year old Caucasian male who is followed in the [facility] for chronic kidney disease stage 3 and baseline creatinine ranging 1.7-1.9, most likely multifactorial etiology with uncontrolled diabetes, hypertension, atherosclerosis, and heart failure.  I last saw him on Dec 02, 2016, and at that time his most recent creatinine level was 2.1.  He is here today in scheduled followup.    SUBJECTIVE:  The patient presents to clinic today accompanied by his sister.  He tells me he is doing great.  He is now at [facility] and loves it.  Has been very physically active and dancing.  Several months ago, he had some significant lee and his cardiologist put him on a "super diuretic" for about 3 days.  Swelling improved greatly and hasn't been a problem since. Recently started on Aricept and his memory is improved. Blood pressures have been good.    REVIEW OF SYSTEMS:  He denies chest pain or shortness of breath.  No lower extremity edema at this time.  Appetite is good.  No nausea or vomiting.  No fever or chills.  No dysuria or hematuria and feels that urine output is adequate with fluid intake.        Allergies:    Allergies   Allergen Reactions   . Lipitor [Atorvastatin]      Concerned about cognitive function.   . Pravachol [Pravastatin] Rash   . Amiodarone        Medications:   Current Outpatient Medications   Medication Sig   . apixaban (ELIQUIS) 5 mg Oral Tablet Take 1 Tab (5 mg total) by mouth Twice daily   . aspirin (ECOTRIN) 81 mg Oral Tablet, Delayed Release (E.C.) Take 81 mg by mouth Once a day   . carvedilol (COREG) 3.125 mg Oral Tablet Take 1 Tab (3.125 mg total) by mouth Twice daily with food   . cholecalciferol, vitamin D3, 1,000 unit Oral Tablet Take 1,000 Units by mouth Once a  day   . Coenzyme Q10 (CO Q-10) 10 mg Oral Capsule Take by mouth   . donepezil (ARICEPT) 10 mg Oral Tablet Take 10 mg by mouth Every night   . erythromycin (ROMYCIN) 5 mg/gram (0.5 %) Ophthalmic Ointment Four times a day   . Ferrous Sulfate 142 mg (45 mg iron) Oral Tablet Sustained Release Take by mouth   . fluticasone propionate (FLUTICASONE NASL) by Nasal route   . folic acid (FOLVITE) 1 mg Oral Tablet Take 1 mg by mouth Once a day   . insulin glargine (LANTUS) 100 unit/mL Subcutaneous injection (vial) 15 Units by Subcutaneous route Every night   . insulin R human (HUMULIN R) 100 units/mL Subcutaneous Injectable 3 Units by Subcutaneous route Four times a day as needed   . isosorbide mononitrate (IMDUR) 30 mg Oral Tablet Sustained Release 24 hr Take 1 Tab (30 mg total) by mouth Once a day   . nitroGLYCERIN (NITROSTAT) 0.4 mg Sublingual Tablet, Sublingual 0.4 mg by Sublingual route Every 5 minutes as needed for Chest pain for 3 doses over 15 minutes   . omega-3-DHA-EPA-fish oil (FISH OIL) 1,000 mg (120 mg-180 mg) Oral Capsule Take by mouth Once a day   .  polyethylene glycol (MIRALAX) 17 gram Oral Powder in Packet Take 1 Packet (17 g total) by mouth Once per day as needed for Other (constipation)   . potassium chloride (K-DUR) 20 mEq Oral Tab Sust.Rel. Particle/Crystal Take 20 mEq by mouth Once a day   . raNITIdine (ZANTAC) 150 mg Oral Tablet Take 150 mg by mouth Twice daily   . sennosides-docusate sodium (SENOKOT-S) 8.6-50 mg Oral Tablet Take 1 Tab by mouth Twice daily   . spironolactone (ALDACTONE) 50 mg Oral Tablet Take 50 mg by mouth Every morning with breakfast   . torsemide (DEMADEX) 20 mg Oral Tablet Take 2 Tabs (40 mg total) by mouth Once a day       LABS:   Office Visit on 08/18/2017   Component Date Value Ref Range Status   . SODIUM 07/31/2017 134   Final   . POTASSIUM 07/31/2017 5.6   Final   . CHLORIDE 07/31/2017 101   Final   . CARBON DIOXIDE 07/31/2017 22   Final   . BUN 07/31/2017 51   Final   .  CREATININE 07/31/2017 2.4   Final   . BUN/CREAT RATIO 07/31/2017 21.25   Final   . ESTIMATED GLOMERULAR FILTRATION RA* 07/31/2017 26   Final   . GLUCOSE, FASTING 07/31/2017 78   Final   . CALCIUM 07/31/2017 9.1   Final   . POTASSIUM 08/03/2017 4.3   Final   Admission on 05/20/2017, Discharged on 05/27/2017   No results displayed because visit has over 200 results.      Admission on 05/19/2017, Discharged on 05/20/2017   Component Date Value Ref Range Status   . GLUCOSE, POC 05/19/2017 73  70 - 110 mg/dL Final   . %FIO2 (ARTERIAL) 05/19/2017 32  % Final   . PH (ARTERIAL) 05/19/2017 7.42  7.35 - 7.45 Final   . PCO2 (ARTERIAL) 05/19/2017 38.0  35.0 - 48.0 mm/Hg Final   . PO2 (ARTERIAL) 05/19/2017 96.0  80.0 - 100.0 mm/Hg Final   . BICARBONATE (ARTERIAL) 05/19/2017 25.1* 22.0-28.0 mmol/L mmol/L Final   . BASE EXCESS (ARTERIAL) 05/19/2017 0.3  -2.0 - 2.0 mmol/L Final   . O2 SATURATION (ARTERIAL) 05/19/2017 99.2* 95.0 - 98.0 % Final   . TCO2 05/19/2017 25.8   Final   . PH (T) 05/19/2017 7.42   Final   . PCO2 (ARTERIAL - TEMP CORRECTED) 05/19/2017 38.0  mm/Hg Final   . PO2 (ARTERIAL - TEMP CORRECTED 05/19/2017 96   Final   . TEMPERATURE, COMP 05/19/2017 37.0  15.0 - 45.0 C Final   . HEMOGLOBIN 05/19/2017 13.4  g/dL Final   . CARBOXYHEMOGLOBIN 05/19/2017 2.7  0.0 - 3.0 % Final   . MET-HEMOGLOBIN 05/19/2017 1.0  0.0 - 1.5 % Final   . OXYHEMOGLOBIN 05/19/2017 95.6  95.0 - 98.0 % Final   . CARBOXYHEMOGLOBIN 05/19/2017 1.9  0.0 - 3.0 % Final   . %FIO2 (VENOUS) 05/19/2017 32.0  % Final   . HEMOGLOBIN 05/19/2017 13.3  g/dL Final   . BICARBONATE (VENOUS) 05/19/2017 24.6  23.0 - 33.0 mmol/L Final   . MET-HEMOGLOBIN 05/19/2017 1.1  0.0 - 1.5 % Final   . OXYHEMOGLOBIN 05/19/2017 42.5* 95.0 - 98.0 % Final   . PCO2 (VENOUS) 05/19/2017 43.00  38.00 - 50.00 mm/Hg Final   . PH (TEMPERATURE CORRECTED) 05/19/2017 7.4   Final   . PO2 (VENOUS - TEMP CORRECTED) 05/19/2017 26.0  mm/Hg Final   . TEMPERATURE, COMP 05/19/2017 37.0  15.0 - 45.0 C  Final   . O2 SATURATION (VENOUS) 05/19/2017 43.8* 95.0 - 98.0 % Final   . PH (VENOUS) 05/19/2017 7.40  7.32 - 7.45 Final   . PO2 (VENOUS) 05/19/2017 26.0* 30.0 - 50.0 mm/Hg Final   . BNP 05/19/2017 1,420* 0 - 100 pg/mL Final   . SODIUM 05/19/2017 142  135 - 145 mmol/L Final   . POTASSIUM 05/19/2017 4.5  3.5 - 5.0 mmol/L Final   . CHLORIDE 05/19/2017 106  98 - 111 mmol/L Final   . CO2 TOTAL 05/19/2017 25  21 - 35 mmol/L Final   . ANION GAP 05/19/2017 11  mmol/L Final   . CALCIUM 05/19/2017 9.2  8.2 - 10.2 mg/dL Final   . GLUCOSE 05/19/2017 126* 70 - 110 mg/dL Final   . BUN 05/19/2017 33* 10 - 25 mg/dL Final   . CREATININE 05/19/2017 1.97* <=1.30 mg/dL Final   . BUN/CREA RATIO 05/19/2017 17   Final   . ESTIMATED GFR 05/19/2017 33* Avg: 75 mL/min/1.41m2 Final   . WBC 05/19/2017 6.6  3.3 - 9.3 x10^3/uL Final   . RBC 05/19/2017 4.60  4.40 - 5.68 x10^6/uL Final   . HGB 05/19/2017 14.1  13.4 - 17.3 g/dL Final   . HCT 05/19/2017 42.6  38.9 - 50.5 % Final   . MCV 05/19/2017 92.7  82.4 - 95.0 fL Final   . MCH 05/19/2017 30.8  27.9 - 33.1 pg Final   . MCHC 05/19/2017 33.2  32.8 - 36.0 g/dL Final   . RDW 05/19/2017 15.7* 10.9 - 15.1 % Final   . PLATELETS 05/19/2017 156  140 - 440 x10^3/uL Final   . MPV 05/19/2017 9.0  6.0 - 10.2 fL Final   . NEUTROPHIL % 05/19/2017 73  % Final   . LYMPHOCYTE % 05/19/2017 15  % Final   . MONOCYTE % 05/19/2017 9  % Final   . EOSINOPHIL % 05/19/2017 2  % Final   . BASOPHIL % 05/19/2017 1  % Final   . NEUTROPHIL # 05/19/2017 4.80  1.60 - 5.50 x10^3/uL Final   . LYMPHOCYTE # 05/19/2017 1.00  0.80 - 3.20 x10^3/uL Final   . MONOCYTE # 05/19/2017 0.60  0.20 - 0.80 x10^3/uL Final   . EOSINOPHIL # 05/19/2017 0.10  0.00 - 0.50 x10^3/uL Final   . BASOPHIL # 05/19/2017 0.00  0.00 - 0.20 x10^3/uL Final   . GLUCOSE, POC 05/19/2017 169* 70 - 110 mg/dL Final   . SODIUM 05/20/2017 140  135 - 145 mmol/L Final   . POTASSIUM 05/20/2017 3.9  3.5 - 5.0 mmol/L Final   . CHLORIDE 05/20/2017 106  98 - 111 mmol/L Final     . CO2 TOTAL 05/20/2017 23  21 - 35 mmol/L Final   . ANION GAP 05/20/2017 11  mmol/L Final   . CALCIUM 05/20/2017 9.3  8.2 - 10.2 mg/dL Final   . GLUCOSE 05/20/2017 95  70 - 110 mg/dL Final   . BUN 05/20/2017 30* 10 - 25 mg/dL Final   . CREATININE 05/20/2017 1.92* <=1.30 mg/dL Final   . BUN/CREA RATIO 05/20/2017 16   Final   . ESTIMATED GFR 05/20/2017 34* Avg: 75 mL/min/1.739m Final   . PHOSPHORUS 05/20/2017 3.6  2.5 - 4.5 mg/dL Final   . MAGNESIUM 05/20/2017 2.0  1.8 - 2.3 mg/dL Final   . INR 05/20/2017 1.66* 0.90 - 1.10 Final   . WBC 05/20/2017 8.0  3.3 - 9.3 x10^3/uL Final   . RBC 05/20/2017 4.82  4.40 - 5.68  x10^6/uL Final   . HGB 05/20/2017 15.2  13.4 - 17.3 g/dL Final   . HCT 05/20/2017 45.2  38.9 - 50.5 % Final   . MCV 05/20/2017 93.7  82.4 - 95.0 fL Final   . MCH 05/20/2017 31.6  27.9 - 33.1 pg Final   . MCHC 05/20/2017 33.7  32.8 - 36.0 g/dL Final   . RDW 05/20/2017 15.9* 10.9 - 15.1 % Final   . PLATELETS 05/20/2017 190  140 - 440 x10^3/uL Final   . MPV 05/20/2017 9.0  6.0 - 10.2 fL Final   . NEUTROPHIL % 05/20/2017 69  % Final   . LYMPHOCYTE % 05/20/2017 17  % Final   . MONOCYTE % 05/20/2017 11  % Final   . EOSINOPHIL % 05/20/2017 2  % Final   . BASOPHIL % 05/20/2017 1  % Final   . NEUTROPHIL # 05/20/2017 5.60* 1.60 - 5.50 x10^3/uL Final   . LYMPHOCYTE # 05/20/2017 1.40  0.80 - 3.20 x10^3/uL Final   . MONOCYTE # 05/20/2017 0.90* 0.20 - 0.80 x10^3/uL Final   . EOSINOPHIL # 05/20/2017 0.10  0.00 - 0.50 x10^3/uL Final   . BASOPHIL # 05/20/2017 0.10  0.00 - 0.20 x10^3/uL Final       PHYSICAL EXAM:  BP 110/74   Pulse 78   Resp 15   Ht 1.829 m (6')   Wt 85.7 kg (189 lb)   BMI 25.63 kg/m    General:  HEENT:  Neck:  Heart:  Lungs:  Abdomen:  Extremities:  Skin:  Neuro:  Psych:        ASSESSMENT/PLAN:  1.          Chronic kidney disease stage 3 with baseline creatinine ranging 1.7-1.9 and most likely multifactorial with uncontrolled diabetes, hypertension, atherosclerosis, and congestive heart failure.  His  most recent creatinine level at 1.7, indicates stability.  He was reminded on the importance of continued avoidance of NSAIDs and IV contrast dye exposure.  2.          Hypertension.  Blood pressure good.  He will continue current medications.  3.          Edema,  much improved.  We will continue the current dose of the Lasix, also advised on the importance of low sodium intake, as this tends to be an admitted problem with the patient.    DISPOSITION:  He will return to the [facility] in 3 months for re-evaluation with lab work completed prior.  I did send orders back with him to [facility].      The patient was seen independently.    Maris Berger APRN, FNP-BC  [facility]  Section of Nephrology  ______________________________________________  Quarterly Lab Tests:    CBC with differential  Magnesium  Calcium  Phosphorous  Albumin  Electrolytes, BUN, creatinine  Iron, Fe Sat, TIBC  IPTH  Vit. D 25 0H  Hgb A1C    Random urine protein  Random urine albumin  Random urine creatinine    Dx: Chronic Kidney Disease N18  Dx: Diabetes E11.9    Please FAX results to:    Maris Berger FNP-BC  [facility]   Department of Nephrology  [facility]  Fax: [phone]

## 2017-08-20 ENCOUNTER — Telehealth (INDEPENDENT_AMBULATORY_CARE_PROVIDER_SITE_OTHER): Payer: Self-pay | Admitting: Medical

## 2017-08-20 NOTE — Telephone Encounter (Signed)
-----   Message from [place], [place] sent at 08/19/2017  3:47 PM EST -----  Thank you. He can continue to take Potassium.    ----- Message -----  From: Lavetta Nielsen, MA  Sent: 08/19/2017   8:41 AM  To: Bridgett Larsson O'Kernick, PA-C    Ashlee,     I scanned results into patients chart. Patient is currently taking 40 mEq of Potassium daily per nurse at [facility].    [place], [place]  08/19/2017, 08:42    ----- Message -----  From: O'Kernick, Bridgett Larsson, PA-C  Sent: 08/18/2017   3:34 PM  To: Lavetta Nielsen, MA    Please call [facility] and obtain results of his repeat BMP.  Thanks!  Ashlee O'Kernick, PA-C  08/18/2017, 15:34

## 2017-08-20 NOTE — Telephone Encounter (Signed)
Phoned [facility] spoke to [facility] she verbalized understanding and does not have any questions or concerns at this time.    [place], [place]  08/20/2017, 10:58

## 2017-08-21 ENCOUNTER — Encounter (INDEPENDENT_AMBULATORY_CARE_PROVIDER_SITE_OTHER): Payer: Self-pay | Admitting: Internal Medicine

## 2017-08-21 NOTE — Nursing Note (Signed)
Meds checked with patients list and confirmed correct pharmacy with patient for e-scribing.     [place], [place]  08/21/2017, 15:57

## 2017-08-27 ENCOUNTER — Encounter (INDEPENDENT_AMBULATORY_CARE_PROVIDER_SITE_OTHER): Payer: Self-pay | Admitting: Nurse Practitioner

## 2017-09-24 ENCOUNTER — Encounter (INDEPENDENT_AMBULATORY_CARE_PROVIDER_SITE_OTHER): Payer: Self-pay | Admitting: Internal Medicine

## 2017-09-24 ENCOUNTER — Ambulatory Visit (INDEPENDENT_AMBULATORY_CARE_PROVIDER_SITE_OTHER): Payer: Medicare Other | Admitting: Internal Medicine

## 2017-09-24 VITALS — BP 116/81 | HR 70 | Resp 18 | Ht 72.0 in | Wt 194.0 lb

## 2017-09-24 DIAGNOSIS — I509 Heart failure, unspecified: Secondary | ICD-10-CM

## 2017-09-24 DIAGNOSIS — I482 Chronic atrial fibrillation: Secondary | ICD-10-CM

## 2017-09-24 DIAGNOSIS — Z95810 Presence of automatic (implantable) cardiac defibrillator: Secondary | ICD-10-CM

## 2017-09-24 DIAGNOSIS — I255 Ischemic cardiomyopathy: Secondary | ICD-10-CM

## 2017-09-24 DIAGNOSIS — I2510 Atherosclerotic heart disease of native coronary artery without angina pectoris: Principal | ICD-10-CM

## 2017-09-24 DIAGNOSIS — I447 Left bundle-branch block, unspecified: Secondary | ICD-10-CM

## 2017-09-24 DIAGNOSIS — I4821 Permanent atrial fibrillation: Secondary | ICD-10-CM

## 2017-09-24 DIAGNOSIS — Z9889 Other specified postprocedural states: Secondary | ICD-10-CM

## 2017-09-24 MED ORDER — EMPAGLIFLOZIN 10 MG TABLET
10.0000 mg | ORAL_TABLET | Freq: Every day | ORAL | 11 refills | Status: DC
Start: 2017-09-24 — End: 2018-01-13

## 2017-09-24 NOTE — Nursing Note (Signed)
Meds checked with patients list and confirmed correct pharmacy with patient for e-scribing.   Loleta Chance, MA  09/24/2017, 11:49

## 2017-09-24 NOTE — Progress Notes (Signed)
[facility] [place], Jeannetta Nap  [address]  [place] [address]  Phone: [phone]  Fax: [phone]    Encounter Date: 09/24/2017    Patient ID:  [name]  SAY:[id number]    DOB:   Age: 77 y.o. male    Subjective:     Chief Complaint   Patient presents with   . Follow Up 3 Months   . Leg Cramps     HPI [name] a pleasant 76 y.o.malepresenting for follow up. He was hospitalized at [facility] November 26th with acute on chronic systolic CHF exacerbation with a BNP of 1571. Prior to his hospitalization, his weight had increased from 168 pounds to 184 pounds. He has a past medical history ofHTN, HLD, IDDM2, CKD, PAD, CAD status post PCI to the LAD and RCA in February of 2013, ischemic cardiomyopathy HFrEF (20-25%) s/p AICD implantation (05/13/16), permanent atrial fibrillation status post AV nodal ablation, history of apical thrombusand dementia.He recently underwent right and left heart catheterization on May 19, 2017 at [facility] and was found to have critical stenosis of the proximal and mid LAD, patent stent to the mid LAD, totally occluded diagonal branch that was the 2nd diagonal branch of the LAD, occluded small obtuse marginal branch of the circumflex, critical stenosis involving the mid RCA and post capillary pulmonary hypertension. He was transferred to [facility] for further management of his heart failure and was diuresed with furosemide IV and metolazone. He was determined to be a poor candidate for high risk PCI given his comorbidities. A transthoracic echocardiogramthatwas completed on 05/20/2017 showed global left ventricular hypokinesis with an EF 27% and mild mitral regurgitation. He was managed conservatively with medical therapy and was discharged to [facility] on increased Demadex 40 mg once daily. In regards to his atrial fibrillation, Digoxin was discontinued secondary to his chronic kidney disease with a baseline serum  creatinine around 2.5. He remains off of Amiodarone due to concerns for Amiodarone toxicity.He remains on chronic anticoagulation with Eliquis 5 mg twice daily for thromboembolic prophylaxis.   He is in my office today accompanied by his sister.  He does indicate that his weight has increased though he does not have much of swelling around his ankles.  He feels better more ambulatory with less shortness of breath no chest pain palpitations syncope or near syncope.  Compliant with his current medication and the list of his medication was verified in our office.  Current Outpatient Medications   Medication Sig   . Ammonium,Pot.& Sodium Lactates Cream by Apply externally route   . apixaban (ELIQUIS) 5 mg Oral Tablet Take 1 Tab (5 mg total) by mouth Twice daily   . aspirin (ECOTRIN) 81 mg Oral Tablet, Delayed Release (E.C.) Take 81 mg by mouth Once a day   . carvedilol (COREG) 3.125 mg Oral Tablet Take 1 Tab (3.125 mg total) by mouth Twice daily with food   . cholecalciferol, vitamin D3, 1,000 unit Oral Tablet Take 1,000 Units by mouth Once a day   . coenzyme Q10 (CO Q-10) 100 mg Oral Capsule Take 100 mg by mouth Every evening with dinner   . donepezil (ARICEPT) 10 mg Oral Tablet Take 10 mg by mouth Every night   . empagliflozin (JARDIANCE) 10 mg Oral Tablet Take 1 Tab (10 mg total) by mouth Once a day   . Ferrous Sulfate 142 mg (45 mg iron) Oral Tablet Sustained Release Take by mouth   . folic acid (FOLVITE) 1  mg Oral Tablet Take 1 mg by mouth Once a day   . insulin glargine (LANTUS) 100 unit/mL Subcutaneous injection (vial) 20 Units by Subcutaneous route Once a day   . insulin R human (HUMULIN R) 100 units/mL Subcutaneous Injectable 5 Units by Subcutaneous route Four times a day as needed    . isosorbide mononitrate (IMDUR) 30 mg Oral Tablet Sustained Release 24 hr Take 1 Tab (30 mg total) by mouth Once a day   . nitroGLYCERIN (NITROSTAT) 0.4 mg Sublingual Tablet, Sublingual 0.4 mg by Sublingual route Every 5 minutes  as needed for Chest pain for 3 doses over 15 minutes   . omega-3-DHA-EPA-fish oil (FISH OIL) 1,000 mg (120 mg-180 mg) Oral Capsule Take by mouth Once a day   . polyethylene glycol (MIRALAX) 17 gram Oral Powder in Packet Take 1 Packet (17 g total) by mouth Once per day as needed for Other (constipation)   . raNITIdine (ZANTAC) 150 mg Oral Tablet Take 150 mg by mouth Once a day    . sennosides-docusate sodium (SENOKOT-S) 8.6-50 mg Oral Tablet Take 1 Tab by mouth Twice daily   . spironolactone (ALDACTONE) 50 mg Oral Tablet Take 50 mg by mouth Every morning with breakfast   . torsemide (DEMADEX) 20 mg Oral Tablet Take 2 Tabs (40 mg total) by mouth Once a day     Allergies   Allergen Reactions   . Lipitor [Atorvastatin]      Concerned about cognitive function.   . Pravachol [Pravastatin] Rash   . Amiodarone      Past Medical History:   Diagnosis Date   . Automatic implantable cardioverter-defibrillator in situ    . BPH (benign prostatic hypertrophy)    . CAD (coronary artery disease) 09/25/2011   . Cataract    . CHF (NYHA class II, ACC/AHA stage C) (CMS HCC) 10/24/2013   . CVA (cerebrovascular accident) (CMS HCC)    . Dementia    . Diabetes mellitus (CMS HCC)    . Diabetes mellitus, type 2 (CMS HCC)    . Disorder of prostate    . Esophageal reflux    . HTN (hypertension)    . MI (myocardial infarction) (CMS HCC)    . Pacemaker    . Renal stones    . Stented coronary artery    . Type 2 diabetes mellitus (CMS HCC)    . Wears dentures    . Wears glasses          Past Surgical History:   Procedure Laterality Date   . CORONARY ARTERY ANGIOPLASTY     . HX ADENOIDECTOMY     . HX APPENDECTOMY     . HX CARDIAC ABLATION     . HX HEART CATHETERIZATION     . HX LITHOTRIPSY     . HX PACEMAKER DEFIBRILLATOR PLACEMENT     . HX TONSILLECTOMY           Family Medical History:     Problem Relation (Age of Onset)    Cancer Sister    Coronary Artery Disease Other    Diabetes Mother, Sister    High Cholesterol Father    Hypertension Father              Social History     Tobacco Use   . Smoking status: Never Smoker   . Smokeless tobacco: Never Used   Substance Use Topics   . Alcohol use: No   . Drug use: No  Review of Systems   Constitutional: Positive for fatigue. Negative for activity change, appetite change, chills, diaphoresis, fever and unexpected weight change.   HENT: Negative for hearing loss.    Eyes: Negative for visual disturbance.   Respiratory: Positive for shortness of breath. Negative for apnea, cough, chest tightness and wheezing.    Cardiovascular: Negative for chest pain, palpitations and leg swelling.   Gastrointestinal: Negative for abdominal pain, blood in stool, nausea and vomiting.   Endocrine: Negative for cold intolerance and heat intolerance.   Genitourinary: Negative for hematuria.   Musculoskeletal: Negative for arthralgias, gait problem and myalgias.   Skin: Negative for color change and rash.   Neurological: Negative for dizziness, seizures, syncope, speech difficulty, weakness and light-headedness.   Hematological: Does not bruise/bleed easily.   Psychiatric/Behavioral: Negative for sleep disturbance. The patient is not nervous/anxious.      Objective:   Vitals: BP 116/81   Pulse 70   Resp 18   Ht 1.829 m (6')   Wt 88 kg (194 lb)   SpO2 97%   BMI 26.31 kg/m         Physical Exam   Constitutional: He is oriented to person, place, and time. He appears well-developed and well-nourished. No distress.   HENT:   Head: Normocephalic and atraumatic.   Eyes: No scleral icterus.   Neck: Neck supple. No JVD present.   Cardiovascular: Normal rate and regular rhythm. Exam reveals no gallop and no friction rub.   No murmur heard.  Pulmonary/Chest: Effort normal and breath sounds normal. No respiratory distress. He has no wheezes. He has no rales.   Abdominal: Soft. Bowel sounds are normal.   Musculoskeletal: He exhibits no edema or tenderness.   Neurological: He is alert and oriented to person, place, and time.   Skin: Skin is  warm and dry. No rash noted. He is not diaphoretic. No erythema.   Psychiatric: He has a normal mood and affect. His behavior is normal.   Nursing note and vitals reviewed.      Assessment & Plan:     ENCOUNTER DIAGNOSES     ICD-10-CM   1. Coronary artery disease involving native coronary artery of native heart without angina pectoris I25.10   2. Ischemic cardiomyopathy I25.5   3. Biventricular ICD (implantable cardioverter-defibrillator) in place Z95.810   4. LBBB (left bundle branch block) I44.7   5. S/P AV nodal ablation Z98.890   6. Permanent atrial fibrillation (CMS HCC) I48.2   7. CHF (NYHA class III, ACC/AHA stage C) (CMS HCC) I57.9    77 year old gentleman with chronic kidney disease, severe ischemic cardiomyopathy NYHA class 3 CHF, diabetes, chronic atrial fibrillation, he has evidence of fluid retention.  I suggested to discontinue potassium since he is also on Aldactone, consider to increase his torsemide from 20-40 mg, add Jardiance impact good flows on to his diabetes medication for it superior benefit for patients with chronic kidney disease heart disease and CHF.  Follow-up visit in 6 months with my office.  In between as needed.  To have a close monitoring of his electrolytes including potassium sodium BUN and creatinine and adjust medication as needed based on these results.  Please contact me if at any time with any questions or concerns.    Orders Placed This Encounter   . empagliflozin (JARDIANCE) 10 mg Oral Tablet       Return in about 6 months (around 03/27/2018).    Vincent Gros, MD, [facility], [facility]  Interventional Cardiologist  Associate  Professor   [facility] Department of Medicine, Section of Cardiology    09/24/2017 12:54

## 2017-11-09 ENCOUNTER — Encounter (INDEPENDENT_AMBULATORY_CARE_PROVIDER_SITE_OTHER): Payer: Self-pay | Admitting: Internal Medicine

## 2017-11-25 ENCOUNTER — Encounter (INDEPENDENT_AMBULATORY_CARE_PROVIDER_SITE_OTHER): Payer: Self-pay | Admitting: Nurse Practitioner

## 2017-12-03 ENCOUNTER — Encounter (INDEPENDENT_AMBULATORY_CARE_PROVIDER_SITE_OTHER): Payer: Self-pay | Admitting: Nurse Practitioner

## 2017-12-03 LAB — ENTER/EDIT NEPHROLOGY EXTERNAL LABS
25-HYDROXY VITAMIN D: 56.5
ALBUMIN (SERUM): 3.2
ALKALINE PHOSPHATASE: 139
ALT (SGPT): 26
AST (SGOT): 28
BUN/CREAT RATIO: 21.6
BUN: 54
CALCIUM: 8.8
CARBON DIOXIDE: 17
CHLORIDE: 99
CREATININE, UR RAND: 55.3
CREATININE: 2.5
ESTIMATED GLOMERULAR FILTRATION RATE: 25
GLUCOSE, FASTING: 381
HCT: 51.4
HGB: 17
INTACT PTH: 219.2
IRON BINDING CAPACITY: 358
IRON SATURATION: 23
IRON: 82
MAGNESIUM: 2.2
MICROALBUMIN RANDOM URINE: 130.6
PHOSPHORUS: 4.7
POTASSIUM: 4.3
PROTEIN, URINE, RANDOM: 36.7
SODIUM: 131
TOTAL PROTEIN: 5.5

## 2017-12-08 ENCOUNTER — Encounter (INDEPENDENT_AMBULATORY_CARE_PROVIDER_SITE_OTHER): Payer: Self-pay | Admitting: Nurse Practitioner

## 2017-12-08 ENCOUNTER — Ambulatory Visit (INDEPENDENT_AMBULATORY_CARE_PROVIDER_SITE_OTHER): Payer: Medicare Other | Admitting: Nurse Practitioner

## 2017-12-08 VITALS — BP 120/70 | HR 70 | Resp 17 | Ht 72.0 in | Wt 191.0 lb

## 2017-12-08 DIAGNOSIS — N184 Chronic kidney disease, stage 4 (severe): Secondary | ICD-10-CM

## 2017-12-08 DIAGNOSIS — I129 Hypertensive chronic kidney disease with stage 1 through stage 4 chronic kidney disease, or unspecified chronic kidney disease: Secondary | ICD-10-CM

## 2017-12-08 DIAGNOSIS — E119 Type 2 diabetes mellitus without complications: Secondary | ICD-10-CM

## 2017-12-11 NOTE — Progress Notes (Signed)
 [facility]  [address]  [place], [place]  [address]  [phone]    Progress Note  Date of Service: 12/08/2017  Name of Patient:  [name]  MRN:  [id number]      DOB:        S:  Jerilynn Birkenhead Semel returns for follow up of CKD. He is accompanied by his sister. The patient is a 77 year old male who is a resident of [facility] with history of CAD s/p PCI, ischemic cardiomyopathy, implanted ICD, systolic and diastolic HF, atrial fibrillation, AV nodal ablation, hypertension, hyperlipidemia, DM II, dementia, BPH, and remote history of kidney stones. In November 2018 he was admitted to [facility]. He had a right and left heart cath with findings of severe multivessel CAD. He was transferred to [facility] and was determined a poor candidate for high risk intervention. He was treated for his fluid overload with furosemide drip and transitioned to oral  torsemide. Losartan, digoxin, and metformin were discontinued. Baseline creatinine prior to hospitalization was 1.8 - 1.9, and increased to 2.6. Less than two weeks after discharge from [facility], he was readmitted to [facility] with CHF exacerbation and again diuresed. Since then his creatinine has remained around  2.4 - 2.6.   Today he reports he has been gaining weight and putting on fluid. His sister also agrees he is heavier and not getting rid of fluid. He reports baseline weight around 170 pounds. He saw his cardiologist in March and torsemide was increased from 20 mg to 40 mg and his spironolactone was continued. He says the increased torsemide did not improve his symptoms and torsemide has since been decreased back to 20 mg due to increased creatinine. He feels full across his abdomen.and somewhat more short of breath. He denies chest pain, palpitations, orthopnea, PND or lower extremity swelling. He is on fluid and sodium restriction but he admits he is often noncompliant and drinks more fluids than his restriction allows. His appetite is good. He  denies nausea or vomiting. He had diarrhea for several days recently but has since resolved. He has no dysuria, hematuria, urinary hesitancy, or flank pain. He reports blood pressure is sometimes up to 323'F systolic but usually not that high. Blood glucose levels are good per his account. I do not have records of blood pressure, glucose levels or daily weights from the nursing home.       Allergies:    Allergies   Allergen Reactions   . Lipitor [Atorvastatin]      Concerned about cognitive function.   . Pravachol [Pravastatin] Rash   . Amiodarone        Medications:   Current Outpatient Medications   Medication Sig   . Ammonium,Pot.& Sodium Lactates Cream by Apply externally route   . apixaban (ELIQUIS) 5 mg Oral Tablet Take 1 Tab (5 mg total) by mouth Twice daily   . aspirin (ECOTRIN) 81 mg Oral Tablet, Delayed Release (E.C.) Take 81 mg by mouth Once a day   . carvedilol (COREG) 3.125 mg Oral Tablet Take 1 Tab (3.125 mg total) by mouth Twice daily with food   . cholecalciferol, vitamin D3, 1,000 unit Oral Tablet Take 1,000 Units by mouth Once a day   . coenzyme Q10 (CO Q-10) 100 mg Oral Capsule Take 100 mg by mouth Every evening with dinner   . donepezil (ARICEPT) 10 mg Oral Tablet Take 10 mg by mouth Every night   . empagliflozin (JARDIANCE) 10 mg Oral Tablet  Take 1 Tab (10 mg total) by mouth Once a day   . Ferrous Sulfate 142 mg (45 mg iron) Oral Tablet Sustained Release Take by mouth   . folic acid (FOLVITE) 1 mg Oral Tablet Take 1 mg by mouth Once a day   . insulin glargine (LANTUS) 100 unit/mL Subcutaneous injection (vial) 20 Units by Subcutaneous route Once a day   . insulin R human (HUMULIN R) 100 units/mL Subcutaneous Injectable 5 Units by Subcutaneous route Four times a day as needed    . isosorbide mononitrate (IMDUR) 30 mg Oral Tablet Sustained Release 24 hr Take 1 Tab (30 mg total) by mouth Once a day   . nitroGLYCERIN (NITROSTAT) 0.4 mg Sublingual Tablet, Sublingual 0.4 mg by Sublingual route Every 5  minutes as needed for Chest pain for 3 doses over 15 minutes   . omega-3-DHA-EPA-fish oil (FISH OIL) 1,000 mg (120 mg-180 mg) Oral Capsule Take by mouth Once a day   . polyethylene glycol (MIRALAX) 17 gram Oral Powder in Packet Take 1 Packet (17 g total) by mouth Once per day as needed for Other (constipation)   . raNITIdine (ZANTAC) 150 mg Oral Tablet Take 150 mg by mouth Once a day    . sennosides-docusate sodium (SENOKOT-S) 8.6-50 mg Oral Tablet Take 1 Tab by mouth Twice daily   . spironolactone (ALDACTONE) 50 mg Oral Tablet Take 50 mg by mouth Every morning with breakfast   . torsemide (DEMADEX) 20 mg Oral Tablet Take 2 Tabs (40 mg total) by mouth Once a day       LABS:   Documentation Only on 12/03/2017   Component Date Value Ref Range Status   . HGB 11/17/2017 17.0   Final   . HCT 11/17/2017 51.4   Final   . SODIUM 11/17/2017 131   Final   . POTASSIUM 11/17/2017 4.3   Final   . CHLORIDE 11/17/2017 99   Final   . CARBON DIOXIDE 11/17/2017 17   Final   . BUN 11/17/2017 54   Final   . CREATININE 11/17/2017 2.5   Final   . BUN/CREAT RATIO 11/17/2017 21.60   Final   . ESTIMATED GLOMERULAR FILTRATION RA* 11/17/2017 25   Final   . GLUCOSE, FASTING 11/17/2017 381   Final   . CALCIUM 11/17/2017 8.8   Final   . TOTAL PROTEIN 11/17/2017 5.5   Final   . ALBUMIN (SERUM) 11/17/2017 3.2   Final   . PHOSPHORUS 11/17/2017 4.7   Final   . MICROALBUMIN RANDOM URINE 11/17/2017 130.6   Final   . CREATININE, UR RAND 11/17/2017 55.3   Final   . INTACT PTH 11/17/2017 219.2   Final   . 25-HYDROXY VITAMIN D 11/17/2017 56.5   Final   . IRON 11/17/2017 82   Final   . IRON BINDING CAPACITY 11/17/2017 358   Final   . IRON SATURATION 11/17/2017 23   Final   . PROTEIN, URINE, RANDOM 11/17/2017 36.7   Final   . MAGNESIUM 11/17/2017 2.2   Final   . AST (SGOT) 11/17/2017 28   Final   . ALT (SGPT) 11/17/2017 26   Final   . ALKALINE PHOSPHATASE 11/17/2017 139   Final   . URINALYSIS MISC TEST 11/17/2017    Final    glucose 500, protein 30, blood-  small, mucous-few,        PHYSICAL EXAM:  BP 120/70   Pulse 70   Resp 17   Ht 1.829 m (6')  Wt 86.6 kg (191 lb)   BMI 25.90 kg/m   General:  Alert 77 year old male who arrived ambulatory and in no apparent distress.   Eyes:  No scleral icterus.   Mouth:  Moist mucous membranes.   Neck:  Supple  Heart:  RRR, no murmurs appreciated.   Lungs:  Clear to auscultation, no wheezes, rhonchi or rales. Normal respiratory effort  Abdomen:  soft, nontender, bowel sounds present.   Extremities:  No lower extremity edema.   Skin:  Warm and dry, no pallor or jaundice.   Neuro:  Intact, no gross focal deficit   Psych:  pleasant, appropriate mood and affect.       ASSESSMENT/PLAN:    1.  Chronic Kidney Disease, eGFR estimates stage IV with multiple possible etiologies including HTN, atherosclerosis, chronic heart failure, diabetes and ageing. Baseline creatinine has increased to 2.2 - 2.6 - probably represents progression of CKD. Avoid all NSAIAs and other nephrotoxic drugs / agents. Renal dose medications to current eGFR. Continue quarterly blood and urine testing (see below) for CKD complications that tend to evolve during Stage 3 or by later CKD stages (mineral metabolism abnormalities and anemia related to CKD).    2.  Volume status - 21 lb weight gain since January per office records. Has increased dyspnea and fullness across abdomen. Will add metolazone 2.5 mg q morning 30 minutes before torsemide. Continue daily weights and record. Goal is for 1 - 2 lb weight loss daily. Continue metolazone as long as he is losing weight but if weight loss stops, then discontinue metolazone. Monitor BMP at least monthly. Continue fluid restriction and sodium restriction.     3. Hypertension, controlled. Continue current medications.     4.  DM II, uncontrolled. HgbA1c 7.5 in November, no recent A1c for review. He has albuminuria. Advise HgbA1c below 6.5  to prevent or delay progression of microvascular and neuropathic  complications of diabetes, including diabetic kidney disease.     5.  Anemia and iron status - history of mild anemia, improved on ferrous sulfate - continue. No current abnormalities, normal Hgb and iron levels.     6.  Secondary hyperparathyroidism, mild hyperphosphatemia  - iPTH increased, add Calcitriol 0.25 mcg three times a week. Phosphorus slightly elevated, limit dietary phosphorus. Calcium and Vitamin D at target. Continue cholecalciferol.    7.  Metabolic acidosis - resume sodium bicarbonate 1,300 mg twice a day.     8.  Return in 3 months for follow up    The patient was seen independently.    Maris Berger APRN, FNP-BC  [place] Department of Medicine  Section of Nephrology  ______________________________________________  Quarterly Lab Tests:    CBC with differential  Magnesium  Calcium  Phosphorus  Albumin  Electrolytes, BUN, creatinine  Iron, Fe Sat, TIBC  IPTH  Vitamin D 25 0H  Hgb A1C    Random urine protein  Random urine albumin  Random urine creatinine    Dx: Chronic Kidney Disease N18  Dx: Diabetes E11.9    Please FAX results to:    Maris Berger FNP-BC  [facility]   Department of Nephrology  [facility]  Fax: [phone]

## 2017-12-17 ENCOUNTER — Encounter (INDEPENDENT_AMBULATORY_CARE_PROVIDER_SITE_OTHER): Payer: Self-pay | Admitting: Nurse Practitioner

## 2017-12-17 LAB — ENTER/EDIT NEPHROLOGY EXTERNAL LABS
BUN: <2
CARBON DIOXIDE: 28
CHLORIDE: 94
CREATININE: 2.5
ESTIMATED GLOMERULAR FILTRATION RATE: 25
POTASSIUM: 4.5
SODIUM: 135

## 2017-12-21 ENCOUNTER — Ambulatory Visit: Payer: Medicare Other | Attending: Internal Medicine

## 2017-12-21 DIAGNOSIS — I447 Left bundle-branch block, unspecified: Secondary | ICD-10-CM

## 2017-12-21 DIAGNOSIS — I5020 Unspecified systolic (congestive) heart failure: Secondary | ICD-10-CM | POA: Insufficient documentation

## 2017-12-21 DIAGNOSIS — I4891 Unspecified atrial fibrillation: Secondary | ICD-10-CM | POA: Insufficient documentation

## 2017-12-21 DIAGNOSIS — Z95810 Presence of automatic (implantable) cardiac defibrillator: Secondary | ICD-10-CM

## 2017-12-21 DIAGNOSIS — I255 Ischemic cardiomyopathy: Secondary | ICD-10-CM | POA: Insufficient documentation

## 2017-12-21 DIAGNOSIS — Z4502 Encounter for adjustment and management of automatic implantable cardiac defibrillator: Secondary | ICD-10-CM | POA: Insufficient documentation

## 2017-12-24 ENCOUNTER — Ambulatory Visit (INDEPENDENT_AMBULATORY_CARE_PROVIDER_SITE_OTHER): Payer: Self-pay | Admitting: Internal Medicine

## 2017-12-24 NOTE — Telephone Encounter (Signed)
Fax received from [facility] stating that patient has been having frequent falls and decrease in BP.  Was seen in the ER on 12/23/17 at [facility] and released.  Per Dr Dellia Beckwith, patient to hold Aldactone until office visit on 01/07/18. Faxed to [facility]

## 2017-12-29 NOTE — Procedures (Signed)
Patient's ICD was checked by remote transmission.    Patient's ICD was evaluated, typically including programmed   parameters, lead(s), battery, capture, sensing function(s), presence or   absence of therapies, and underlying heart rhythm. Often but not always   this includes sensor rate response, lower and upper heart rates, AV   intervals, pacing voltage and pulse duration, sensing value, and   diagnostics.  Please see programmer printout for details (scanned document).    SJM #[id number]  CRT-D.  Battery status etr=2.2-3.1 years.  1 event 6.10.19 2 8:02a labeled "VF" successfully treated with ATP. No symptoms reported.   100% MS, known.  Otherwise normal device check. Coming to see Dr. Karie Mainland next week.  Mariana Arn, RN 12/29/2017      An episode of rapid V-tach successfully treated with ATP no symptoms reported will correlate clinically when patient seen in office

## 2018-01-07 ENCOUNTER — Encounter (INDEPENDENT_AMBULATORY_CARE_PROVIDER_SITE_OTHER): Payer: Self-pay | Admitting: Internal Medicine

## 2018-01-07 ENCOUNTER — Encounter (HOSPITAL_COMMUNITY): Payer: Self-pay | Admitting: INTERNAL MEDICINE CARDIOVASCULAR DISEASE

## 2018-01-07 ENCOUNTER — Telehealth (INDEPENDENT_AMBULATORY_CARE_PROVIDER_SITE_OTHER): Payer: Self-pay | Admitting: Physician Assistant

## 2018-01-07 NOTE — Telephone Encounter (Signed)
[employer] Rep available for interrogation of HV impedance alert. Patient was just discharged to a skilled nursing facility from [facility] on January 05, 2018, and they cancelled his appointment for today. He may not be a good candidate for lead revision. Pacing function of device is working appropriately.    Ashok Norris, PA-C  01/07/2018, 15:10  [place] Department of Medicine, Section of Cardiology

## 2018-01-07 NOTE — Nursing Note (Signed)
Wolfgang Phoenix, RN; Darlyn Chamber, RN            [name] was scheduled for device interrogation today for HV impedance alert. His appointment was cancelled. It appears he was just discharged from [facility] two days ago to a skilled nursing facility. He has been on a decline. His pacing function is appropriate, and he may not be a candidate for lead revision. Just wanted to keep you all in the loop.     Thanks for your help,   [name], Cordelia Poche 01/07/2018, 15:14

## 2018-01-11 ENCOUNTER — Ambulatory Visit (INDEPENDENT_AMBULATORY_CARE_PROVIDER_SITE_OTHER): Payer: Medicare Other | Admitting: Internal Medicine

## 2018-01-11 ENCOUNTER — Encounter (INDEPENDENT_AMBULATORY_CARE_PROVIDER_SITE_OTHER): Payer: Self-pay | Admitting: Internal Medicine

## 2018-01-11 VITALS — BP 75/53 | HR 70 | Resp 18 | Ht 72.0 in

## 2018-01-11 DIAGNOSIS — Z9889 Other specified postprocedural states: Secondary | ICD-10-CM

## 2018-01-11 DIAGNOSIS — I5023 Acute on chronic systolic (congestive) heart failure: Secondary | ICD-10-CM

## 2018-01-11 DIAGNOSIS — I255 Ischemic cardiomyopathy: Secondary | ICD-10-CM

## 2018-01-11 DIAGNOSIS — I447 Left bundle-branch block, unspecified: Secondary | ICD-10-CM

## 2018-01-11 DIAGNOSIS — Z95810 Presence of automatic (implantable) cardiac defibrillator: Secondary | ICD-10-CM

## 2018-01-11 DIAGNOSIS — I9589 Other hypotension: Principal | ICD-10-CM

## 2018-01-11 NOTE — Nursing Note (Signed)
Meds checked with list provided from [facility]

## 2018-01-11 NOTE — Progress Notes (Signed)
[facility] [place], Jeannetta Nap  [address]  [place] [address]  Phone: [phone]  Fax: [phone]    Encounter Date: 01/11/2018    Patient ID:  [name]  WNU:[id number]    DOB:   Age: 77 y.o. male    Subjective:     Chief Complaint   Patient presents with   . FOLLOWUP INDIVIDUAL THERAPY-MED CHECK     HPI [name] a pleasant 77 y.o.malepresenting for follow up. He [facility] 26[facility] with acute on chronic systolic CHF exacerbation with a BNP of 1571. Prior to his hospitalization, his weight had increased from 168 pounds to 184 pounds. He has a past medical history ofHTN, HLD, IDDM2, CKD, PAD, CAD status post PCI to the LAD and RCA in February of 2013, ischemic cardiomyopathy HFrEF (20-25%) s/p AICD implantation (05/13/16), permanent atrial fibrillation status post AV nodal ablation, history of apical thrombusand dementia.He recently underwent right and left heart catheterization on May 19, 2017 at [facility] and was found to have critical stenosis of the proximal and mid LAD, patent stent to the mid LAD, totally occluded diagonal branch that was the 2nd diagonal branch of the LAD, occluded small obtuse marginal branch of the circumflex, critical stenosis involving the mid RCA and post capillary pulmonary hypertension. He was transferred to [facility] for further management of his heart failure and was diuresed with furosemide IV and metolazone. He was determined to be a poor candidate for high risk PCI given his comorbidities. A transthoracic echocardiogramthatwas completed on 05/20/2017 showed global left ventricular hypokinesis with an EF 27% and mild mitral regurgitation. He was managed conservatively with medical therapy and was discharged to [facility] on increased Demadex 40 mg once daily. In regards to his atrial fibrillation,Digoxin was discontinued secondary to his chronic kidney disease with a baseline serum  creatinine around 2.5. He remains off of Amiodaronedue to concerns for Amiodarone toxicity.He remains on chronic anticoagulation with Eliquis 5 mg twice daily for thromboembolic prophylaxis.   He is in my office today accompanied by his sister.  During today's office visit he feels very tired and short of breath and have no energy.  His sister indicated that he had 2 visits to the emergency room this week with subsequent discharged back to [facility].  He gets tired very quickly him feel tired and lightheaded.  No chest pain no palpitations.  He indicated that his fluid pill has been held as well as blood pressure medications.        Current Outpatient Medications   Medication Sig   . Ammonium,Pot.& Sodium Lactates Cream by Apply externally route   . apixaban (ELIQUIS) 5 mg Oral Tablet Take 1 Tab (5 mg total) by mouth Twice daily   . aspirin (ECOTRIN) 81 mg Oral Tablet, Delayed Release (E.C.) Take 81 mg by mouth Once a day   . bumetanide (BUMEX) 0.5 mg Oral Tablet Take 0.5 mg by mouth Once a day   . calcitriol (ROCALTROL) 0.25 mcg Oral Capsule Take 0.25 mcg by mouth Once a day   . carvedilol (COREG) 3.125 mg Oral Tablet Take 1 Tab (3.125 mg total) by mouth Twice daily with food   . cholecalciferol, vitamin D3, 1,000 unit Oral Tablet Take 1,000 Units by mouth Once a day   . coenzyme Q10 (CO Q-10) 100 mg Oral Capsule Take 100 mg by mouth Every evening with dinner   . donepezil (ARICEPT) 10 mg Oral Tablet Take 10 mg by  mouth Every night   . empagliflozin (JARDIANCE) 10 mg Oral Tablet Take 1 Tab (10 mg total) by mouth Once a day (Patient not taking: Reported on 01/11/2018)   . Ferrous Sulfate 142 mg (45 mg iron) Oral Tablet Sustained Release Take by mouth   . folic acid (FOLVITE) 1 mg Oral Tablet Take 1 mg by mouth Once a day   . insulin glargine (LANTUS) 100 unit/mL Subcutaneous injection (vial) 30 Units by Subcutaneous route Once a day    . insulin R human (HUMULIN R) 100 units/mL Subcutaneous Injectable 5 Units by  Subcutaneous route Four times a day as needed    . isosorbide mononitrate (IMDUR) 30 mg Oral Tablet Sustained Release 24 hr Take 1 Tab (30 mg total) by mouth Once a day   . nitroGLYCERIN (NITROSTAT) 0.4 mg Sublingual Tablet, Sublingual 0.4 mg by Sublingual route Every 5 minutes as needed for Chest pain for 3 doses over 15 minutes   . omega-3-DHA-EPA-fish oil (FISH OIL) 1,000 mg (120 mg-180 mg) Oral Capsule Take by mouth Once a day   . polyethylene glycol (MIRALAX) 17 gram Oral Powder in Packet Take 1 Packet (17 g total) by mouth Once per day as needed for Other (constipation)   . raNITIdine (ZANTAC) 150 mg Oral Tablet Take 150 mg by mouth Once a day    . sennosides-docusate sodium (SENOKOT-S) 8.6-50 mg Oral Tablet Take 1 Tab by mouth Twice daily   . sodium bicarbonate 650 mg Oral Tablet Take 650 mg by mouth Twice daily   . spironolactone (ALDACTONE) 50 mg Oral Tablet Take 25 mg by mouth Every morning with breakfast    . torsemide (DEMADEX) 20 mg Oral Tablet Take 2 Tabs (40 mg total) by mouth Once a day (Patient not taking: Reported on 01/11/2018)     Allergies   Allergen Reactions   . Lipitor [Atorvastatin]      Concerned about cognitive function.   . Pravachol [Pravastatin] Rash   . Amiodarone      Past Medical History:   Diagnosis Date   . Automatic implantable cardioverter-defibrillator in situ    . BPH (benign prostatic hypertrophy)    . CAD (coronary artery disease) 09/25/2011   . Cataract    . CHF (NYHA class II, ACC/AHA stage C) (CMS HCC) 10/24/2013   . CVA (cerebrovascular accident) (CMS HCC)    . Dementia    . Diabetes mellitus (CMS HCC)    . Diabetes mellitus, type 2 (CMS HCC)    . Disorder of prostate    . Esophageal reflux    . HTN (hypertension)    . MI (myocardial infarction) (CMS HCC)    . Pacemaker    . Renal stones    . Stented coronary artery    . Type 2 diabetes mellitus (CMS HCC)    . Wears dentures    . Wears glasses          Past Surgical History:   Procedure Laterality Date   . CORONARY ARTERY  ANGIOPLASTY     . HX ADENOIDECTOMY     . HX APPENDECTOMY     . HX CARDIAC ABLATION     . HX HEART CATHETERIZATION     . HX LITHOTRIPSY     . HX PACEMAKER DEFIBRILLATOR PLACEMENT     . HX TONSILLECTOMY           Family Medical History:     Problem Relation (Age of Onset)    Cancer Sister  Coronary Artery Disease Other    Diabetes Mother, Sister    High Cholesterol Father    Hypertension (High Blood Pressure) Father            Social History     Tobacco Use   . Smoking status: Never Smoker   . Smokeless tobacco: Never Used   Substance Use Topics   . Alcohol use: No   . Drug use: No       Review of Systems   Constitutional: Positive for fatigue. Negative for activity change, appetite change, chills, diaphoresis, fever and unexpected weight change.   HENT: Negative for hearing loss.    Eyes: Negative for visual disturbance.   Respiratory: Positive for shortness of breath. Negative for apnea, cough, chest tightness and wheezing.    Cardiovascular: Positive for leg swelling. Negative for chest pain and palpitations.   Gastrointestinal: Negative for abdominal pain, blood in stool, nausea and vomiting.   Endocrine: Negative for cold intolerance and heat intolerance.   Genitourinary: Negative for hematuria.   Musculoskeletal: Positive for arthralgias and back pain. Negative for myalgias.   Skin: Negative for color change and rash.   Neurological: Positive for dizziness. Negative for seizures, syncope, speech difficulty, weakness and light-headedness.   Hematological: Does not bruise/bleed easily.   Psychiatric/Behavioral: Negative for sleep disturbance. The patient is not nervous/anxious.      Objective:   Vitals: BP (!) 75/53   Pulse 70   Resp 18   Ht 1.829 m (6')   SpO2 98%   BMI 25.90 kg/m         Physical Exam   Constitutional: He is oriented to person, place, and time. He appears well-developed and well-nourished. No distress.   HENT:   Head: Normocephalic and atraumatic.   Eyes: No scleral icterus.   Neck: Neck  supple. No JVD present.   Cardiovascular: Normal rate and regular rhythm. Exam reveals no gallop and no friction rub.   No murmur heard.  Pulmonary/Chest: Effort normal and breath sounds normal. No respiratory distress. He has no wheezes. He has no rales.   Abdominal: Soft. Bowel sounds are normal.   Musculoskeletal: He exhibits edema. He exhibits no tenderness.   Neurological: He is alert and oriented to person, place, and time.   Skin: Skin is warm and dry. No rash noted. He is not diaphoretic. No erythema.   Psychiatric: He has a normal mood and affect. His behavior is normal.   Nursing note and vitals reviewed.      Assessment & Plan:     ENCOUNTER DIAGNOSES     ICD-10-CM   1. Other specified hypotension I95.89   2. Acute on chronic systolic congestive heart failure, NYHA class 4 (CMS HCC) I50.23   3. LBBB (left bundle branch block) I44.7   4. Ischemic cardiomyopathy I25.5   5. Biventricular ICD (implantable cardioverter-defibrillator) in place Z95.810   6. S/P AV nodal ablation Z23.68    77 year old with advanced ischemic and nonischemic cardiomyopathy.  He has biventricular pacemaker Jason Fila was AV nodal ablation.  More recently he is getting very hypotensive.  I suggest for him to go to the emergency room and decision has to be made whether he will go under hospice with comfort care versus considering IV pressors with diuresis.  I called ER and discussed the case in detail with Dr. Conley Rolls.    No orders of the defined types were placed in this encounter.      Return in about 1 month (around  02/08/2018).    Vincent Gros, MD, [facility], [facility]  Interventional Cardiologist  Associate Professor of Medicine  [facility] Department of Medicine, Section of Cardiology    01/11/2018 17:04

## 2018-01-12 ENCOUNTER — Inpatient Hospital Stay (HOSPITAL_COMMUNITY): Payer: Medicare Other

## 2018-01-12 ENCOUNTER — Observation Stay
Admission: AD | Admit: 2018-01-12 | Discharge: 2018-01-15 | Disposition: A | Discharge status: Nursing Home (Includes ICF) | Payer: Medicare Other | Source: Other Acute Inpatient Hospital | Attending: Student in an Organized Health Care Education/Training Program | Admitting: Student in an Organized Health Care Education/Training Program

## 2018-01-12 ENCOUNTER — Observation Stay (HOSPITAL_BASED_OUTPATIENT_CLINIC_OR_DEPARTMENT_OTHER): Payer: Medicare Other | Admitting: Student in an Organized Health Care Education/Training Program

## 2018-01-12 DIAGNOSIS — R748 Abnormal levels of other serum enzymes: Secondary | ICD-10-CM

## 2018-01-12 DIAGNOSIS — Z79899 Other long term (current) drug therapy: Secondary | ICD-10-CM | POA: Insufficient documentation

## 2018-01-12 DIAGNOSIS — I4891 Unspecified atrial fibrillation: Secondary | ICD-10-CM

## 2018-01-12 DIAGNOSIS — I130 Hypertensive heart and chronic kidney disease with heart failure and stage 1 through stage 4 chronic kidney disease, or unspecified chronic kidney disease: Secondary | ICD-10-CM | POA: Insufficient documentation

## 2018-01-12 DIAGNOSIS — Z950 Presence of cardiac pacemaker: Secondary | ICD-10-CM | POA: Insufficient documentation

## 2018-01-12 DIAGNOSIS — D509 Iron deficiency anemia, unspecified: Secondary | ICD-10-CM | POA: Insufficient documentation

## 2018-01-12 DIAGNOSIS — N183 Chronic kidney disease, stage 3 (moderate): Secondary | ICD-10-CM | POA: Insufficient documentation

## 2018-01-12 DIAGNOSIS — R627 Adult failure to thrive: Secondary | ICD-10-CM | POA: Insufficient documentation

## 2018-01-12 DIAGNOSIS — I5023 Acute on chronic systolic (congestive) heart failure: Secondary | ICD-10-CM

## 2018-01-12 DIAGNOSIS — I2510 Atherosclerotic heart disease of native coronary artery without angina pectoris: Secondary | ICD-10-CM

## 2018-01-12 DIAGNOSIS — N400 Enlarged prostate without lower urinary tract symptoms: Secondary | ICD-10-CM | POA: Insufficient documentation

## 2018-01-12 DIAGNOSIS — K219 Gastro-esophageal reflux disease without esophagitis: Secondary | ICD-10-CM | POA: Insufficient documentation

## 2018-01-12 DIAGNOSIS — E559 Vitamin D deficiency, unspecified: Secondary | ICD-10-CM | POA: Insufficient documentation

## 2018-01-12 DIAGNOSIS — Z95810 Presence of automatic (implantable) cardiac defibrillator: Secondary | ICD-10-CM | POA: Insufficient documentation

## 2018-01-12 DIAGNOSIS — I255 Ischemic cardiomyopathy: Secondary | ICD-10-CM

## 2018-01-12 DIAGNOSIS — Z7901 Long term (current) use of anticoagulants: Secondary | ICD-10-CM | POA: Insufficient documentation

## 2018-01-12 DIAGNOSIS — E1122 Type 2 diabetes mellitus with diabetic chronic kidney disease: Secondary | ICD-10-CM | POA: Insufficient documentation

## 2018-01-12 DIAGNOSIS — K819 Cholecystitis, unspecified: Secondary | ICD-10-CM

## 2018-01-12 DIAGNOSIS — N189 Chronic kidney disease, unspecified: Secondary | ICD-10-CM

## 2018-01-12 DIAGNOSIS — I959 Hypotension, unspecified: Secondary | ICD-10-CM | POA: Insufficient documentation

## 2018-01-12 DIAGNOSIS — T502X5A Adverse effect of carbonic-anhydrase inhibitors, benzothiadiazides and other diuretics, initial encounter: Secondary | ICD-10-CM | POA: Insufficient documentation

## 2018-01-12 DIAGNOSIS — I252 Old myocardial infarction: Secondary | ICD-10-CM | POA: Insufficient documentation

## 2018-01-12 DIAGNOSIS — R55 Syncope and collapse: Secondary | ICD-10-CM

## 2018-01-12 DIAGNOSIS — Z66 Do not resuscitate: Secondary | ICD-10-CM | POA: Insufficient documentation

## 2018-01-12 DIAGNOSIS — Z7982 Long term (current) use of aspirin: Secondary | ICD-10-CM | POA: Insufficient documentation

## 2018-01-12 DIAGNOSIS — K761 Chronic passive congestion of liver: Secondary | ICD-10-CM

## 2018-01-12 DIAGNOSIS — I517 Cardiomegaly: Secondary | ICD-10-CM

## 2018-01-12 DIAGNOSIS — F0390 Unspecified dementia without behavioral disturbance: Secondary | ICD-10-CM | POA: Insufficient documentation

## 2018-01-12 DIAGNOSIS — I5042 Chronic combined systolic (congestive) and diastolic (congestive) heart failure: Secondary | ICD-10-CM | POA: Insufficient documentation

## 2018-01-12 DIAGNOSIS — K8020 Calculus of gallbladder without cholecystitis without obstruction: Secondary | ICD-10-CM

## 2018-01-12 DIAGNOSIS — I482 Chronic atrial fibrillation: Secondary | ICD-10-CM

## 2018-01-12 DIAGNOSIS — Z794 Long term (current) use of insulin: Secondary | ICD-10-CM | POA: Insufficient documentation

## 2018-01-12 DIAGNOSIS — R1011 Right upper quadrant pain: Secondary | ICD-10-CM | POA: Insufficient documentation

## 2018-01-12 DIAGNOSIS — I481 Persistent atrial fibrillation: Secondary | ICD-10-CM | POA: Insufficient documentation

## 2018-01-12 DIAGNOSIS — I509 Heart failure, unspecified: Secondary | ICD-10-CM | POA: Diagnosis present

## 2018-01-12 DIAGNOSIS — I5022 Chronic systolic (congestive) heart failure: Secondary | ICD-10-CM

## 2018-01-12 DIAGNOSIS — Z8673 Personal history of transient ischemic attack (TIA), and cerebral infarction without residual deficits: Secondary | ICD-10-CM | POA: Insufficient documentation

## 2018-01-12 DIAGNOSIS — Z955 Presence of coronary angioplasty implant and graft: Secondary | ICD-10-CM | POA: Insufficient documentation

## 2018-01-12 LAB — CBC WITH DIFF
BASOPHIL #: <0.1 x10ˆ3/uL (ref <=0.20)
BASOPHIL %: 0 %
EOSINOPHIL #: 0.12 x10?3/uL (ref <=0.50)
EOSINOPHIL #: 0.12 x10ˆ3/uL (ref <=0.50)
EOSINOPHIL %: 2 %
HCT: 42 % (ref 38.9–52.0)
HGB: 13.9 g/dL (ref 13.4–17.5)
IMMATURE GRANULOCYTE #: 0.11 x10ˆ3/uL — ABNORMAL HIGH (ref <0.10)
IMMATURE GRANULOCYTE %: 2 % — ABNORMAL HIGH (ref 0–1)
LYMPHOCYTE #: 0.91 x10ˆ3/uL — ABNORMAL LOW (ref 1.00–4.80)
LYMPHOCYTE %: 13 %
MCH: 30.5 pg (ref 26.0–32.0)
MCHC: 33.1 g/dL (ref 31.0–35.5)
MCV: 92.3 fL (ref 78.0–100.0)
MONOCYTE #: 0.91 x10ˆ3/uL (ref 0.20–1.10)
MONOCYTE %: 13 %
NEUTROPHIL #: 5.13 x10ˆ3/uL (ref 1.50–7.70)
NEUTROPHIL %: 70 %
PLATELETS: 112 x10ˆ3/uL — ABNORMAL LOW (ref 150–400)
RBC: 4.55 x10ˆ6/uL (ref 4.50–6.10)
RDW-CV: 14.8 % (ref 11.5–15.5)
WBC: 7.2 x10ˆ3/uL (ref 3.7–11.0)

## 2018-01-12 LAB — BASIC METABOLIC PANEL
ANION GAP: 10 mmol/L (ref 4–13)
BUN/CREA RATIO: 18 (ref 6–22)
BUN: 37 mg/dL — ABNORMAL HIGH (ref 8–25)
CALCIUM: 9.9 mg/dL (ref 8.5–10.2)
CHLORIDE: 100 mmol/L (ref 96–111)
CO2 TOTAL: 27 mmol/L (ref 22–32)
CREATININE: 2.11 mg/dL — ABNORMAL HIGH (ref 0.62–1.27)
ESTIMATED GFR: 31 mL/min/{1.73_m2} — ABNORMAL LOW (ref >59)
GLUCOSE: 125 mg/dL (ref 65–139)
POTASSIUM: 4.6 mmol/L (ref 3.5–5.1)
SODIUM: 137 mmol/L (ref 136–145)

## 2018-01-12 LAB — HEPATIC FUNCTION PANEL
ALBUMIN: 4.1 g/dL (ref 3.4–4.8)
ALKALINE PHOSPHATASE: 99 U/L (ref 45–115)
ALT (SGPT): 12 U/L (ref <55)
AST (SGOT): 26 U/L (ref 8–48)
BILIRUBIN DIRECT: 0.6 mg/dL — ABNORMAL HIGH (ref <0.3)
BILIRUBIN TOTAL: 1.6 mg/dL — ABNORMAL HIGH (ref 0.3–1.3)
PROTEIN TOTAL: 6.4 g/dL (ref 6.0–8.0)

## 2018-01-12 LAB — B-TYPE NATRIURETIC PEPTIDE (BNP),PLASMA: BNP: 1642 pg/mL — ABNORMAL HIGH (ref <=100)

## 2018-01-12 LAB — PHOSPHORUS: PHOSPHORUS: 1.9 mg/dL — ABNORMAL LOW (ref 2.3–4.0)

## 2018-01-12 LAB — MAGNESIUM: MAGNESIUM: 2.2 mg/dL (ref 1.6–2.6)

## 2018-01-12 LAB — PT/INR
INR: 1.68 — ABNORMAL HIGH (ref 0.80–1.20)
PROTHROMBIN TIME: 19.5 s — ABNORMAL HIGH (ref 9.5–14.1)

## 2018-01-12 MED ORDER — FERROUS SULFATE 324 MG (65 MG IRON) TABLET,DELAYED RELEASE
324.00 mg | DELAYED_RELEASE_TABLET | ORAL | Status: DC
Start: 2018-01-13 — End: 2018-01-15
  Administered 2018-01-13 – 2018-01-15 (×2): 324 mg via ORAL
  Filled 2018-01-12 (×2): qty 1

## 2018-01-12 MED ORDER — SODIUM BICARBONATE 650 MG TABLET
650.00 mg | ORAL_TABLET | Freq: Two times a day (BID) | ORAL | Status: DC
Start: 2018-01-13 — End: 2018-01-13
  Filled 2018-01-12: qty 1

## 2018-01-12 MED ORDER — INSULIN LISPRO 100 UNIT/ML SUB-Q SSIP
2.00 [IU] | INJECTION | Freq: Four times a day (QID) | SUBCUTANEOUS | Status: DC | PRN
Start: 2018-01-12 — End: 2018-01-15
  Administered 2018-01-13: 4 [IU] via SUBCUTANEOUS
  Administered 2018-01-14 – 2018-01-15 (×3): 2 [IU] via SUBCUTANEOUS
  Filled 2018-01-12: qty 3

## 2018-01-12 MED ORDER — CARVEDILOL 3.125 MG TABLET
3.1250 mg | ORAL_TABLET | Freq: Two times a day (BID) | ORAL | Status: DC
Start: 2018-01-13 — End: 2018-01-12

## 2018-01-12 MED ORDER — INSULIN GLARGINE (U-100) 100 UNIT/ML SUBCUTANEOUS SOLUTION
20.0000 [IU] | Freq: Every day | SUBCUTANEOUS | Status: DC
Start: 2018-01-13 — End: 2018-01-15
  Administered 2018-01-13 – 2018-01-15 (×3): 20 [IU] via SUBCUTANEOUS
  Filled 2018-01-12 (×3): qty 20

## 2018-01-12 MED ORDER — ISOSORBIDE MONONITRATE ER 30 MG TABLET,EXTENDED RELEASE 24 HR
30.0000 mg | ORAL_TABLET | Freq: Every day | ORAL | Status: DC
Start: 2018-01-13 — End: 2018-01-15
  Administered 2018-01-13 – 2018-01-15 (×3): 30 mg via ORAL
  Filled 2018-01-12 (×3): qty 1

## 2018-01-12 MED ORDER — POLYETHYLENE GLYCOL 3350 17 GRAM ORAL POWDER PACKET
17.00 g | Freq: Every day | ORAL | Status: DC | PRN
Start: 2018-01-12 — End: 2018-01-15

## 2018-01-12 MED ORDER — CARVEDILOL 3.125 MG TABLET
3.1250 mg | ORAL_TABLET | Freq: Two times a day (BID) | ORAL | Status: DC
Start: 2018-01-13 — End: 2018-01-15
  Administered 2018-01-13 – 2018-01-15 (×5): 3.125 mg via ORAL
  Filled 2018-01-12 (×6): qty 1

## 2018-01-12 MED ORDER — ASPIRIN 81 MG TABLET,DELAYED RELEASE
81.0000 mg | DELAYED_RELEASE_TABLET | Freq: Every day | ORAL | Status: DC
Start: 2018-01-13 — End: 2018-01-15
  Administered 2018-01-13 – 2018-01-15 (×3): 81 mg via ORAL
  Filled 2018-01-12 (×4): qty 1

## 2018-01-12 MED ORDER — SENNOSIDES 8.6 MG-DOCUSATE SODIUM 50 MG TABLET
1.00 | ORAL_TABLET | Freq: Two times a day (BID) | ORAL | Status: DC
Start: 2018-01-13 — End: 2018-01-15
  Administered 2018-01-13: 1 via ORAL
  Administered 2018-01-13: 21:00:00 0 via ORAL
  Administered 2018-01-13: 1 via ORAL
  Administered 2018-01-13 – 2018-01-15 (×4): 0 via ORAL
  Filled 2018-01-12 (×3): qty 1

## 2018-01-12 MED ORDER — FOLIC ACID 1 MG TABLET
1.0000 mg | ORAL_TABLET | Freq: Every day | ORAL | Status: DC
Start: 2018-01-13 — End: 2018-01-15
  Administered 2018-01-13 – 2018-01-15 (×3): 1 mg via ORAL
  Filled 2018-01-12 (×3): qty 1

## 2018-01-12 MED ORDER — SODIUM CHLORIDE 0.9 % INJECTION SOLUTION
2.0000 mL | INTRAVENOUS | Status: AC
Start: 2018-01-12 — End: 2018-01-13
  Administered 2018-01-13: 2 mL via INTRAVENOUS

## 2018-01-12 MED ORDER — CHOLECALCIFEROL (VITAMIN D3) 25 MCG (1,000 UNIT) TABLET
1000.0000 [IU] | ORAL_TABLET | Freq: Every day | ORAL | Status: DC
Start: 2018-01-13 — End: 2018-01-15
  Administered 2018-01-13 – 2018-01-15 (×3): 1000 [IU] via ORAL
  Filled 2018-01-12 (×3): qty 1

## 2018-01-12 MED ORDER — HYDRALAZINE 10 MG TABLET
10.0000 mg | ORAL_TABLET | Freq: Three times a day (TID) | ORAL | Status: DC
Start: 2018-01-13 — End: 2018-01-13
  Administered 2018-01-13: 10 mg via ORAL
  Filled 2018-01-12 (×5): qty 1

## 2018-01-12 MED ORDER — DONEPEZIL 5 MG TABLET
10.00 mg | ORAL_TABLET | Freq: Every evening | ORAL | Status: DC
Start: 2018-01-13 — End: 2018-01-15
  Administered 2018-01-13 – 2018-01-14 (×3): 10 mg via ORAL
  Filled 2018-01-12 (×4): qty 2

## 2018-01-12 MED ORDER — SPIRONOLACTONE 25 MG TABLET
25.0000 mg | ORAL_TABLET | Freq: Every morning | ORAL | Status: DC
Start: 2018-01-13 — End: 2018-01-12

## 2018-01-12 MED ORDER — FAMOTIDINE 20 MG TABLET
20.0000 mg | ORAL_TABLET | Freq: Every day | ORAL | Status: DC
Start: 2018-01-13 — End: 2018-01-15
  Administered 2018-01-13 – 2018-01-15 (×3): 20 mg via ORAL
  Filled 2018-01-12 (×3): qty 1

## 2018-01-12 MED ORDER — APIXABAN 5 MG TABLET
5.00 mg | ORAL_TABLET | Freq: Two times a day (BID) | ORAL | Status: DC
Start: 2018-01-13 — End: 2018-01-15
  Administered 2018-01-13 – 2018-01-15 (×6): 5 mg via ORAL
  Filled 2018-01-12 (×7): qty 1

## 2018-01-12 NOTE — Nurses Notes (Signed)
Pt admitted 10SE 21 from outside facility with CHF exacerbation and hypotension. Upon arrival pt BP stable. Pt oriented to room and surroundings call bell within reach. No complaints of pain and denies having any questions.

## 2018-01-12 NOTE — H&P (Signed)
[facility]  Cardiology Admission  History and Physical      Jim, [name], 77 y.o. male  Date of Admission:  01/12/2018  Date of Birth:    PCP:  Nehemiah Settle, MD    Chief Complaint:   syncope    HPI:  [name] is a pleasant 77 year old male PMH significant for CAD s/p PCI (2013), HFrEF~27% (05/2017), AV nodal ablation s/p pacemaker-defibrillator (2015), atrial fibrillation ( Eliquis ), HTN, HLD, and CKD3b-4  who presented as a transfer from [facility] C/0 hypotension with concern of pre-syncopal versus device malfunction. The patient was seen by Dr. Dellia Beckwith in clinic where he was found to have a BP of 73/53 prompting presentation to the ED.  The patient states that when he goes to sit up he wakes up on the ground. Per the records, the symptoms have been present for several months are gradually worsening. He was evaluated by his Nephrologist back in May of this year with complaints of dyspnea and abdominal fullness. At that time, he was started on metolazone 2.5 daily 30 min prior to his Torsemide 40.  He has been hospitalized twice in the last week and his blood pressure both admissions seem to be somewhere in the range of 100/60.  He denies frank SOB at rest but is unable to tell us about DOE as he is bed-bound.  The patient does specifically deny CP, dizziness, headache, vision changes, fever, chills, abdominal pain, changes in his bowel/bladder, or worsening edema of his lower extremities.    Admission Medications:    Current Facility-Administered Medications:  [START ON 01/13/2018] carvedilol (COREG) tablet 3.125 mg Oral 2x/day-Food   perflutren lipid microspheres (DEFINITY) 1.3 mL in NS 10 mL (tot vol) injection 2 mL Intravenous Give in Cardiology   [START ON 01/13/2018] spironolactone (ALDACTONE) tablet 25 mg Oral Daily with Breakfast       Past Medical History:   Diagnosis Date   . Automatic implantable cardioverter-defibrillator in situ    . BPH (benign prostatic hypertrophy)    .  CAD (coronary artery disease) 09/25/2011   . Cataract    . CHF (NYHA class II, ACC/AHA stage C) (CMS HCC) 10/24/2013   . CVA (cerebrovascular accident) (CMS HCC)    . Dementia    . Diabetes mellitus (CMS HCC)    . Diabetes mellitus, type 2 (CMS HCC)    . Disorder of prostate    . Esophageal reflux    . HTN (hypertension)    . MI (myocardial infarction) (CMS HCC)    . Pacemaker    . Renal stones    . Stented coronary artery    . Type 2 diabetes mellitus (CMS HCC)    . Wears dentures    . Wears glasses        Past Surgical History:   Procedure Laterality Date   . CORONARY ARTERY ANGIOPLASTY     . HX ADENOIDECTOMY     . HX APPENDECTOMY     . HX CARDIAC ABLATION     . HX HEART CATHETERIZATION     . HX LITHOTRIPSY     . HX PACEMAKER DEFIBRILLATOR PLACEMENT     . HX TONSILLECTOMY         Allergies   Allergen Reactions   . Lipitor [Atorvastatin]      Concerned about cognitive function.   . Pravachol [Pravastatin] Rash   . Amiodarone      Dye:  No  Iodine:  No  Family Medical History:     Problem Relation (Age of Onset)    Cancer Sister    Coronary Artery Disease Other    Diabetes Mother, Sister    High Cholesterol Father    Hypertension (High Blood Pressure) Father          Social History     Tobacco Use   . Smoking status: Never Smoker   . Smokeless tobacco: Never Used   Substance Use Topics   . Alcohol use: No        Review of Systems: Positives bold and underlined   Constitutional: Night sweats, weight loss, fevers  Skin:  Jaundice, worrisome moles, itching  Eyes: Double vision, pain, discharge  ENT:  Tinnitus, hearing loss, dysphagia  Cardiac: Chest pain, shortness of breath, orthopnea, palpitations, syncope  Respiratory: Wheeze, DOE, cough, shortness of breath  Heme/Lymph: Lymphadenopathy   Endocrine: Polyuria, polydipsia, polyphagia  GI: Melena, change in the caliber of stools, diarrhea, constipation, heartburn, nausea, vomiting  GU: Nocturia, frequency, urgency, hematuria, dysuria   Musculoskeletal:  Arthralgias,  myalgias, red hot swollen joints  Neuro: Focal weakness, loss of sensation, headaches    Physical Examination:   Vitals:    01/12/18 2032 01/12/18 2051 01/12/18 2059   BP: 113/75 115/67    Pulse:  70    SpO2:  100%    Weight:   88.1 kg (194 lb 3.6 oz)   Height:   1.829 m (6')   BMI:   26.4     General: Alert and Oriented x3, ill  appearing, no acute distress.  HEENT: Normocephalic atraumatic, neck supple, no thyromegaly, no JVD.  Heart: RRR, S1/S2 audible, no S3/4, no murmurs, rubs, or gallops.  Chest: CTA b/l, no rales, rhonchi, or wheezing.  Abd: soft, non tender, non distended, +BS, no hepatosplenomegaly  Ext: no clubbing, cyanosis, or edema; Strength grossly intact.  Skin: Warm and dry.  Neuro: CN grossly intact, no focal deficits.   Psych: Normal mood and affect.    Labs:  -Creatinine 2.11, baseline appears to be around 2.5  -bilirubin 1.6  -BNP 1642  -A1c 8.8 12/15/17    Imaging Studies:   Previous echo results:  05/20/2017 LVEF 27 percent, global LV hypokinesis, moderate pulmonary HTN, mild secondary MR, mild TR  Previous ECG results:   Ventricular paced  Previous Cath: 05/19/2017 critical stenosis of the proximal and mid LAD, patent stent to the mid LAD, totally occluded diagonal branch that was the 2nd diagonal branch of the LAD, occluded small obtuse marginal branch of the circumflex, critical stenosis involving the mid RCA and post capillary pulmonary hypertension- determined to be a poor candidate for high risk PCI given his comorbidities    Patient has decision making capacity:  Yes    Code Status:  Full Code    Assessment/Plan:   [name] is a pleasant 77 year old male PMH significant for CAD s/p PCI (2013), HFrEF~27% (05/2017) s/p pacemaker-defibrillator (2015), atrial fibrillation ( Eliquis ), HTN, HLD, and CKD3b-4  Who presented as a transfer from [facility] C/0 hypotension with concern of pre-syncopal versus device malfunction.    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 s/p BiV-D   -appears euvolemic without JVD,  hepatojugular reflex, or peripheral edema  -BNP 1642 down from 2762 in November 2018  -CXR cardiomegaly without evidence of pulmonary edema/effusion/consolidation ( read by me )  -continue IMdur 30  -hold  Torsemide 40 and Bumetanide   -restart hydralazine 10 q.8 hours  -will consider addition of low-dose ACE/ARB and  Aldactone if BP will tolerate as creatinine appears to be near baseline  -Palliative Care consultation last admission November 2018  -device interrogation pending    RUQ pain   DX: Cholecystitis, cholelithiasis, choledocholithiasis, Hepatic congestion from heart failure and unlikely cholangitis given patient's stability  -afebrile normotensive, no leukocytosis  -AST/ ALT WNL  -INR 1.68, appears baseline  -bilirubin total elevated to 1.6  -RUQ ultrasound pending  -no indication for antimicrobial coverage given patient's stability    Chronic hospital problems  Atrial Fibrillation  -CHADSVASC 6, HAS BLED 5  -S/p BiV-D in 2015, AVN in 2017  -Coreg 3.125 b.i.d.  -Eliquis 5 mg BID  CAD  -ASA 81  -Allergy to statin  -Coreg 3.125 b.i.d. As above  CK3b-4  -Creatinine appears baseline  -cautious use of nephrotoxic agents  -avoid digoxin  DM 2   -A1c 8.8 12/15/17  -Lantus home 30 daily, decrease 20 daily while hospitalized  -SSI per protocol  Dementia  -Aricept 10 mg QHS  Vitamin-D deficiency   -D3 1000 units daily  Iron deficiency anemia  -Iron 324 EOD, folate 1 daily  GERD   -Pepcid 20  Bowel regimen   -MiraLax p.r.n. And Senokot scheduled    DVT/PE Prophylaxis: Apixaban    Hulan Fray, D.O.  Internal Medicine, PGY-3  [facility]       Late entry for 01/12/18. I saw and examined the patient.  I reviewed the resident's note.  I agree with the findings and plan of care as documented in the resident's note.  Any exceptions/additions are edited/noted.    Known DNR - change code status to DNR. Not in ADHF, actually hypovolemic if anything clinically - hold diuretics. No lead issue per EP. Rationalize HF regimen  and discuss goals of care with POA sister. Not an advanced therapies candidate.    Brayton Layman, DO

## 2018-01-13 ENCOUNTER — Observation Stay (HOSPITAL_BASED_OUTPATIENT_CLINIC_OR_DEPARTMENT_OTHER): Payer: Medicare Other

## 2018-01-13 ENCOUNTER — Inpatient Hospital Stay (HOSPITAL_BASED_OUTPATIENT_CLINIC_OR_DEPARTMENT_OTHER): Payer: Medicare Other

## 2018-01-13 DIAGNOSIS — K824 Cholesterolosis of gallbladder: Secondary | ICD-10-CM

## 2018-01-13 DIAGNOSIS — I340 Nonrheumatic mitral (valve) insufficiency: Secondary | ICD-10-CM

## 2018-01-13 DIAGNOSIS — I517 Cardiomegaly: Secondary | ICD-10-CM

## 2018-01-13 DIAGNOSIS — I959 Hypotension, unspecified: Secondary | ICD-10-CM

## 2018-01-13 DIAGNOSIS — Z4502 Encounter for adjustment and management of automatic implantable cardiac defibrillator: Secondary | ICD-10-CM

## 2018-01-13 LAB — BASIC METABOLIC PANEL
ANION GAP: 8 mmol/L (ref 4–13)
BUN/CREA RATIO: 18 (ref 6–22)
BUN: 33 mg/dL — ABNORMAL HIGH (ref 8–25)
CALCIUM: 9.6 mg/dL (ref 8.5–10.2)
CHLORIDE: 105 mmol/L (ref 96–111)
CO2 TOTAL: 24 mmol/L (ref 22–32)
CREATININE: 1.87 mg/dL — ABNORMAL HIGH (ref 0.62–1.27)
ESTIMATED GFR: 35 mL/min/1.73mˆ2 — ABNORMAL LOW (ref >59)
GLUCOSE: 76 mg/dL (ref 65–139)
POTASSIUM: 4.1 mmol/L (ref 3.5–5.1)
SODIUM: 137 mmol/L (ref 136–145)

## 2018-01-13 LAB — POC BLOOD GLUCOSE (RESULTS)
GLUCOSE, POC: 82 mg/dL (ref 70–105)
GLUCOSE, POC: 211 mg/dL — ABNORMAL HIGH (ref 70–105)
GLUCOSE, POC: 118 mg/dL — ABNORMAL HIGH (ref 70–105)
GLUCOSE, POC: 138 mg/dL — ABNORMAL HIGH (ref 70–105)
GLUCOSE, POC: 230 mg/dL — ABNORMAL HIGH (ref 70–105)

## 2018-01-13 LAB — ECG 12-LEAD
Atrial Rate: 394 {beats}/min
Calculated R Axis: -68 degrees
Calculated T Axis: 20 degrees
QRS Duration: 126 ms
QT Interval: 462 ms
QTC Calculation: 498 ms
Ventricular rate: 70 {beats}/min

## 2018-01-13 LAB — PROCALCITONIN: PROCALCITONIN: <0.05 ng/mL (ref <0.50)

## 2018-01-13 LAB — HGA1C (HEMOGLOBIN A1C WITH EST AVG GLUCOSE)
ESTIMATED AVERAGE GLUCOSE: 189 mg/dL
HEMOGLOBIN A1C: 8.2 % — ABNORMAL HIGH (ref 4.0–5.6)

## 2018-01-13 MED ADMIN — heparin (porcine) 25,000 unit/250 mL (100 unit/mL) in dextrose 5 % IV: ORAL | @ 01:00:00

## 2018-01-13 NOTE — Progress Notes (Signed)
[facility]  Cardiology   Progress Note      [name], [name]  Date of Admission:  01/12/2018  Date of service: 01/13/2018  Date of Birth:    MRN:  [id number]    Hospital Day:  LOS: 1 day      Subjective: Feeling well, resting calmly with no chest pain or dyspnea. No abdominal pain or leg pain. Wants to know when he can go home. Tried to discuss hospice with patient but does not seem to fully understand scenario.    Objective:     Vital Signs:  Filed Vitals:    01/12/18 2032 01/12/18 2051 01/12/18 2338 01/13/18 0411   BP: 113/75 115/67 118/71 126/61   Pulse:  70 71 71   Resp:  16 16 14    Temp:  36.6 C (97.9 F) 36.7 C (98.1 F) 36.6 C (97.8 F)   SpO2:  100% 100% 98%       Physical Exam:  -Constitutional: no acute distress, pleasant  -Eyes: conjunctiva clear, sclera non-icteric, EOMI  -Mouth: oral mucosa pink & moist  -Cardiac: RRR, audible S1 + S2, no M/R/G, JV pulsations seen at about clavicle while supine  -Respiratory: CTA bilaterally, no crackles/wheezing  -Extremities: no C/C/E  -Abdominal: soft, nondistended, NTTP, +BS  -Neurological: grossly normal, no focal deficits, A&Ox3  -Integument: no rashes, petechiae, or purpura    I/O:  I/O last 24 hours:      Intake/Output Summary (Last 24 hours) at 01/13/2018 0711  Last data filed at 01/13/2018 0618  Gross per 24 hour   Intake --   Output 630 ml   Net -630 ml     I/O current shift:  No intake/output data recorded.    Labs: recent labs reviewed  - BNP = 1600 last night  - BMP: Cr = 1.87 <-- 2.11 last night, lytes largely WNL including bicarb  - Bili 1.6/0.6    Last echo 05/2017:  -EF:  27%, WMA score 2.0/global LV hypoK, mod pulm HTN    Current Medications:    Current Facility-Administered Medications:  apixaban (ELIQUIS) tablet 5 mg Oral 2x/day   aspirin (ECOTRIN) enteric coated tablet 81 mg 81 mg Oral Daily   carvedilol (COREG) tablet 3.125 mg Oral 2x/day-Food   cholecalciferol (VITAMIN D3) tablet 1,000 Units Oral Daily   donepezil (ARICEPT)  tablet 10 mg Oral NIGHTLY   famotidine (PEPCID) tablet 20 mg Oral Daily   ferrous sulfate 324 mg (65 mg elemental IRON) tablet 324 mg Oral EVERY OTHER DAY   folic acid (FOLVITE) tablet 1 mg Oral Daily   hydrALAZINE (APRESOLINE) tablet 10 mg Oral Q8HRS   insulin glargine (LANTUS) 100 units/mL injection 20 Units Subcutaneous Daily   isosorbide mononitrate (IMDUR) 24 hr extended release tablet 30 mg Oral Daily   perflutren lipid microspheres (DEFINITY) 1.3 mL in NS 10 mL (tot vol) injection 2 mL Intravenous Give in Cardiology   polyethylene glycol (MIRALAX) oral packet 17 g Oral Daily PRN   sennosides-docusate sodium (SENOKOT-S) 8.6-50mg  per tablet 1 Tab Oral 2x/day   SSIP insulin lispro (HUMALOG) 100 units/mL injection 2-6 Units Subcutaneous 4x/day PRN       Allergies   Allergen Reactions   . Lipitor [Atorvastatin]      Concerned about cognitive function.   . Pravachol [Pravastatin] Rash   . Amiodarone        -ASA:  Yes  -Beta Blocker:  Yes  -ACEI/ARB:  No, Elevated CR  -Statin:  No:  Allergy  -  Antiplatelets (Plavix, Brilinta, Effient): No:  unclear but suspected >1 year from last stent  -Spironolactone Indicated (Heart Failure): No:  Other elevated creatinine  -DVT/PE Prophylaxis: Apixaban     ASSESSMENT/PLAN:  Active Hospital Problems    Diagnosis   . CHF (congestive heart failure) (CMS HCC)     77 y/o with dizziness while sitting up and walking, ICD interrogated found to be unremarkable for any events, was hypotensive on his cardiac meds with Dr. Dellia Beckwith. Attempting to reconcile medications with nursing home [facility] and uses [facility]) pharmacy. Likely send home on current regimen with diuretics only as PRN, definitely at risk for ESRD with ACE/ARB/Entresto.    Given neurocognitive deficit, he is NOT a suitable candidate for cardiac transplant or assist device (LVAD or other). I discussed case with sister who told me that back in November they were at least notified of 2 nearby hospice providers should  be progress to requiring hospice and is amenable to palliative care re-involvement.    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  - Appears fairly euvolemic on Coreg/IMDUR only for now  - Will DC with torsemide 20 for 3 lbs/day or 5 lbs/2 day gain period  - Repeat echo pending but may be concerning for LV thrombus per staff initial interpretation  - Will discuss hospice with sister/patient tomorrow --> sister to arrive at 10-11:00 tomorrow morning    RUQ pain, unclear etiology: improving  - RUQ [place] largely without acute pathology  - Cont to monitor, PRN Tylenol    CHRONIC ILLNESSES  - Afib s/p AV nodal ablation: CHADSVASC = 6, HASBLED = 5, cont Coreg/Eliquis  - CKD3: avoid nephrotoxins/trend BMP/dose meds to GFR  - DM-2: SSI + Lantus 20  - Neurocognitive deficit: cont Aricept  - Vit D/iron deficiencies: cont supplementation  - GERD: cont Pepcid    - Diet: diabetic  - DVT prophylaxis: Eliquis  - DNR but OK to intubate    Disposition Planning: DC to nursing facility [facility] likely tomorrow but will need hospice discussion with daughter/patient first    Clayton Lefort  PGY-3  [facility] Internal Medicine  Pager [phone]      I saw and examined the patient.  I reviewed the resident's note.  I agree with the findings and plan of care as documented in the resident's note.  Any exceptions/additions are edited/noted.    No ADHF. No lead issue per EP. Does have overall poor prognosis given dementia, CKD, HFrEF. Known DNR. Discuss hospice options, not advanced therapies candidate.    Brayton Layman, DO

## 2018-01-13 NOTE — Procedures (Signed)
[facility]  Cardiology Bedside EP Device Interrogation    [name]  Date of service: 01/13/2018    Device interrogation results:    Reason for the Consult:  Device interrogation  Requesting Service:  Cardiology blue    Device Background:  Implanting Physician: Dr Rennie Plowman  Manufacturer: Device manufacturer: [employer]  Device:Biventricular defibrillator  Model: Dillon [id number] 0165-53Z CRT-D  Implanting Date:  Nov 21, 2013    Estimated battery longevity:  1.9-2.9 years.     Mode: DDDR  Base Rate: 70-120 bpm    Atrial lead: Measured threshold  not performed due to AFib. Programmed output 5.0 V @ 0.5 ms. Lead impedence 400 ohms.     Right Ventricular lead: Measured threshhold  0.75 V @ 0.5 ms Programmed output 2.5 V @ 0.5 ms. Lead impedence 350 ohms.    Left Ventricular lead: Measured threshhold  0.75 V @ 0.5 ms Programmed output 2.5 V @ 0.5 ms. Lead impedence 590 ohms.    Tachytherapy:  Enabled.  There is no VT zone.  VF zone set at 200 beats per minute ATP x1 and shock therapy. HV impedance 46 Ohms    Episodes:  None    Underlying rhythm:  Atrial fibrillation.  94% Bi V paced.  Pacemaker dependent.    Changes made:  None    Conclusions: Normal pacemaker function, Stable pacing thresholds, Stable sensing thresholds, HV lead impedance greater than upper limit on December 23, 2017.     Follow up: Routine follow up with primary cardiologist.      Yolanda Bonine, MD  01/13/2018, 05:46  Fellow, Cardiovascular Disease  [facility]  Pager # [phone]    PACEMAKER OR DEFIBRILLATOR EVALUATION (INTERROGATION EVALUATION OR PROGRAMMING EVALUATION), OR ANALYSIS OF REMOTE MONITORING, AS APPLICABLE    Order:    See patient's medical record.    Procedure Description:    Standard programming evaluation (or interrogation evaluation) using device programmer, or standard analysis of remote monitoring, as appropriate.    Data recording:    See printouts, where available, for details.    Physician Analysis and  Reporting:    Unremarkable device analysis. No major abnormalities, except as otherwise noted, if applicable.      Plan:    Plan routine follow-up, as appropriate, or unless otherwise indicated.  Please see notes and/or scanned printouts (where available) for details.      Yanin Muhlestein B. Noreene Filbert, M.D.  Pager: [phone]  Office: [phone]

## 2018-01-13 NOTE — Ancillary Notes (Signed)
.  Patient not ambulated due to orders are for up in chair only, per bedside RN patient from nursing home, will ask PT to evaluate. Will see if appropriate per PT.    Any questions call Cardiac Rehabilitation, below    [name]  [facility]  [facility]  Cardiac Rehabilitation  Phone # [phone]  ;

## 2018-01-13 NOTE — Care Plan (Signed)
[facility]  Rehabilitation Services  Physical Therapy Initial Evaluation    Patient Name: [name]  Date of Birth:   Height: Height: 182.9 cm (6')  Weight: Weight: 89.6 kg (197 lb 8.5 oz)  Room/Bed: 21/A  Payor: MEDICARE / Plan: MEDICARE PART A AND B / Product Type: Medicare /     Assessment:      Pt able to tolerate PT evaluation poorly today. Pt reporting significant pain in LLE which inhibited him from attempting to sit EOB. BUE and RLE have strength and ROM WFL for baseline. Pt would benefit from continued OOB mobility assessment during acute care stay. Pt is possible candidate for sara stedy use to attain standing posture once able to sit EOB. Once medically appropriate, pt would be appropriate to return to previous care facility in [place].    Discharge Needs:    Equipment Recommendation: TBD     Discharge Disposition: skilled nursing facility   The above recommendation is based upon the current examination and evaluation performed on this date. As subsequent sessions are completed, recommendations will be updated accordingly.    JUSTIFICATION OF DISCHARGE RECOMMENDATION   Based on current diagnosis, functional performance prior to admission, and current functional performance, this patient requires continued PT services in skilled nursing facility in order to achieve significant functional improvements in these deficit areas: aerobic capacity/endurance, gait, locomotion, and balance, muscle performance, ROM (range of motion).        Plan:   Current Intervention: balance training, bed mobility training, gait training, postural re-education, strengthening, stretching, transfer training  To provide physical therapy services minimum of 3x/week  for duration of until discharge.    The risks/benefits of therapy have been discussed with the patient/caregiver and he/she is in agreement with the established plan of care.       Subjective & Objective        01/13/18 1440   Therapist  Pager   PT Assigned/ Pager # Richardson Dopp [phone]/Carrie [phone]   Rehab Session   Document Type evaluation   Total PT Minutes: 12   Patient Effort adequate   Symptoms Noted During/After Treatment fatigue;increased pain   General Information   Patient Profile Reviewed? yes   Onset of Illness/Injury or Date of Surgery 01/12/18   Medical Lines PIV Line;Telemetry   Respiratory Status room air   Existing Precautions/Restrictions fall precautions;full code   Mutuality/Individual Preferences   Individualized Care Needs OOB to chair via maxi move    Patient-Specific Goals (Include Timeframe) to get back to rehab center he was at previously    [place]   Lives With facility resident   Living Arrangements *nursing home   Functional Level Prior   Prior Functional Level Comment per EMR, pt is w/c bound, unable to clarify during subjective interview    Self-Care   Usual Activity Tolerance fair   Current Activity Tolerance poor   Pre Treatment Status   Pre Treatment Patient Status Patient supine in bed;Call light within reach;Telephone within reach;Sitter select activated;Nurse approved session   Support Present Pre Treatment  None   Communication Pre Treatment  Nurse   Cognitive Assessment/Interventions   Behavior/Mood Observations alert;cooperative;anxious  (initially declining but willing after encouragement )   Orientation Status oriented to;person;place;situation;disoriented to;time   Attention WNL/WFL   Follows Commands WNL   Vital Signs   Pre-Treatment Heart Rate (beats/min) 73   Post-treatment Heart Rate (beats/min) 70   Pre SpO2 (%) 100   O2 Delivery Pre Treatment room air  Post SpO2 (%) 100   O2 Delivery Post Treatment room air   Pain Assessment   Pain Scale: Numbers, Pretreatment 6/10   Pain Scale: Numbers, Post-Treatment 6/10   Pain Location - Side Left   Pain Location - Orientation lower   Pain Location extremity   RUE Assessment   RUE Assessment WFL for stated baseline   LUE Assessment   LUE Assessment X-Exceptions      LUE ROM WFL for baseline    LUE Strength hip flexion 4/5, distally WFL   RLE Assessment   RLE Assessment WFL for stated baseline   LLE Assessment   LLE Assessment X-Exceptions   LLE ROM unable to assess hip/knee due to pain. ankle WFL for baseline    LLE Strength unable to assess hip/knee due to pain. ankle [facility] for baseline    Mobility Assessment/Training   Additional Documentation Bed Mobility Assessment/Treatment (Group)   Bed Mobility Assessment/Treatment   Supine-Sit Independence not tested   Safety Issues decreased use of arms for pushing/pulling;decreased use of legs for bridging/pushing;impaired trunk control for bed mobility   Roll Right Independence moderate assist (50% patient effort)   Transfer Assessment/Treatment   Sit-Stand Independence not tested   Gait Assessment/Treatment   Independence  not tested   Post Treatment Status   Post Treatment Patient Status Patient supine in bed;Call light within reach;Telephone within reach;Media planner Post Treatment Comment updated on response to eval   Plan of Care Review   Plan Of Care Reviewed With patient   Physical Therapy Clinical Impression   Assessment Pt able to tolerate PT evaluation poorly today. Pt reporting significant pain in LLE which inhibited him from attempting to sit EOB. BUE and RLE have strength and ROM WFL for baseline. Pt would benefit from continued OOB mobility assessment during acute care stay. Pt is possible candidate for sara stedy use to attain standing posture once able to sit EOB. Once medically appropriate, pt would be appropriate to return to previous care facility in [place].   Patient/Family Goals Statement to get back to pervious facility of residence    Criteria for Skilled Therapeutic yes;meets criteria;skilled treatment is necessary   Pathology/Pathophysiology Noted musculoskeletal;neuromuscular   Impairments Found (describe  specific impairments) aerobic capacity/endurance;gait, locomotion, and balance;muscle performance;ROM (range of motion)   Functional Limitations in Following  self-care;home management;community/leisure   Disability: Inability to Perform community/leisure   Rehab Potential fair, will monitor progress closely   Therapy Frequency minimum of 3x/week   Predicted Duration of Therapy Intervention (days/wks) until discharge   Anticipated Equipment Needs at Discharge (PT) TBD   Anticipated Discharge Disposition skilled nursing facility   Highest level of Mobility score   Exercise Level 2- Turned self in bed/ROM   Evaluation Complexity Justification   Patient History: Co-morbidity/factors that impact Plan of Care 3 or more that impact Plan of Care;One or more other medical co-morbidity;Complicated prior level of function/decline in function;Social support barriers/factors   Examination Components 4 or more Exam elements addressed;Vital signs (BP, HR, RR, or O2 saturation);Range of motion;Strength;Bed mobility   Presentation Unstable: Characteristics of condition unpredictable &/or significant cognitive deficits affect safety   Clinical Decision Making High complexity   Evaluation Complexity High complexity   Care Plan Goals   PT Rehab Goals Bed Mobility Goal;Strength Goal;Transfer Training Goal   Bed Mobility Goal   Bed Mobility Goal, Date Established 01/13/18   Bed Mobility  Goal, Time to Achieve by discharge   Bed Mobility Goal, Activity Type all bed mobility activities   Bed Mobility Goal, Independence Level minimum assist (75% patient effort)   Bed Mobility Goal, Assistive Device least restrictive assistive device   Strength Goal   Strength Goal, Date Established 01/13/18   Strength Goal, Time to Achieve by discharge   Strength Goal, Measure to Achieve Pt will demonstrate 4/5 strength in LLE hip and knee    Transfer Training Goal   Transfer Training Goal, Date Established 01/13/18   Transfer Training Goal, Time to Achieve  by discharge   Transfer Training Goal, Activity Type bed-to-chair/chair-to-bed   Transfer Training Goal, Independence Level moderate assist (50% patient effort)   Planned Therapy Interventions, PT Eval   Planned Therapy Interventions (PT) balance training;bed mobility training;gait training;postural re-education;strengthening;stretching;transfer training       Therapist:   Linford Arnold, PHYSICAL THERAPY STUDENT   Pager #: [phone]    I was present for the student's eval/treatment of the patient. I agree with the findings and plan of care as documented in the student's note. Any exceptions/additions are edited/noted  Belva Chimes, PT,DPT [phone]

## 2018-01-13 NOTE — Care Plan (Signed)
Pacer interrogated at bedside, no problems identified. U/S completed, waiting on reading. Plan of care undetermined at this time. Sitter select in place, call bell within reach.

## 2018-01-13 NOTE — Care Plan (Signed)
[facility]  Medical Nutrition Therapy Screen Note                                      Date of Service: 01/13/2018    Reason for Note: Consult: CHF     Reviewed patient status, diet order/TF/TPN, labs and medications.  Height: 182.9 cm (6')   Weight: 89.6 kg (197 lb 8.5 oz) (01/13/18 0618)   Body mass index is 26.79 kg/m.    Brief Subjective:   Met with patient at bedside at bedside. Patient reports he resides in a nursing facility and receives a low salt diet. He reports typically eating 3 meals/day and a snack. Reports a UBW of 190# and some weight gain recently due to fluid. He reports he is weighed daily.Reviewed heart failure diet education with patient. No questions or concerns at this time.     Nutrition related problems: Knowledge deficit: Diet for Heart Failure     Assessment: No assessment at this time    Monitor: Po status, Tolerance of diet and Upon physician consult    Plan/Intervention:   1. Continue Cardiac/Diabetic Diet with 2 gram sodium and 2000 ml fluid restriction, increase CC to 2200 to better meet estimated needs.   2. Encourage intake at meals and snacks   3. Continue Daily weights   4. Education- Diet for Heart Failure provided and reviewed       Tama Gander, MS, RD, LD   01/13/2018, 09:24  Pager [phone]

## 2018-01-13 NOTE — Care Management Notes (Signed)
Utilization Review Determination    SECTION I  Reason for Physician Advisor Referral: Does not meet inpatient criteria. 01/12/18    Current MERLIN MD Order: inpt    Utilization Review Findings: Patient meets observation status.  Utilization Review MD: Dr. Seward Meth    Based on clinical information available to me as per above and in chart, the patient's status should be: Observation    Ola Spurr Morris-Metts, Utilization Review RN 01/13/2018, 11:34  -------------------------------------------------------------------------------------------------------------------  1.  I have reviewed this case with the patient's treating physician Celine Mans, and the MD agrees with this change in status.  They are aware of the referral to the Utilization Review Committee.    2.  The patient will be  notified by [facility] DCP on 01/13/2018 of changes in level of care.  Please refer to the education documentation and/or [employer]scripts for specific time of delivery.    Ola Spurr Morris-Metts, Utilization Review RN 01/13/2018, 11:34  (Patient notification is required only if the status is changed while an Inpatient to Observation Status)  -------------------------------------------------------------------------------------------------------------------

## 2018-01-13 NOTE — Pharmacist Med Reconciliation (Signed)
Pharmacy Medication Reconciliation    Patient Name: [name], [name]  Date of Service: 01/13/2018  Date of Admission: 01/12/2018  Date of Birth:   Length of Stay:   0 days     Transitions of Care:  1. Would you like to utilize the [facility] Medicine Discharge Pharmacy?  UNABLE TO ASSESS    Information was collected from:  Caregiver (Nursing Home [facility]: [phone])    Clarified Prior to Admission Medications:  Prior to Admission medications    Medication Sig Taking Resumed Y/N Comments   apixaban (ELIQUIS) 5 mg Oral Tablet Take 1 Tab (5 mg total) by mouth Twice daily Yes       aspirin (ECOTRIN) 81 mg Oral Tablet, Delayed Release (E.C.) Take 81 mg by mouth Once a day Yes       bumetanide (BUMEX) 0.5 mg Oral Tablet Take 0.5 mg by mouth Once a day As needed for weight gain of 2 pounds Yes       calcitriol (ROCALTROL) 0.25 mcg Oral Capsule Take 0.25 mcg by mouth Once a day On Monday/Wednesday/Friday Yes       carvedilol (COREG) 3.125 mg Oral Tablet Take 1 Tab (3.125 mg total) by mouth Twice daily with food Yes       cholecalciferol, vitamin D3, 1,000 unit Oral Tablet Take 1,000 Units by mouth Once a day Yes       coenzyme Q10 (CO Q-10) 100 mg Oral Capsule Take 100 mg by mouth Every evening with dinner Yes       donepezil (ARICEPT) 10 mg Oral Tablet Take 10 mg by mouth Every night Yes       Ferrous Sulfate 142 mg (45 mg iron) Oral Tablet Sustained Release Take by mouth Yes       folic acid (FOLVITE) 1 mg Oral Tablet Take 1 mg by mouth Once a day Yes       insulin glargine (LANTUS) 100 unit/mL Subcutaneous injection (vial) 30 Units by Subcutaneous route Once a day  Yes       insulin R human (HUMULIN R) 100 units/mL Subcutaneous Injectable 5 Units by Subcutaneous route Four times a day as needed  Yes       isosorbide mononitrate (IMDUR) 30 mg Oral Tablet Sustained Release 24 hr Take 1 Tab (30 mg total) by mouth Once a day Yes       nitroGLYCERIN (NITROSTAT) 0.4 mg Sublingual Tablet, Sublingual 0.4 mg by Sublingual  route Every 5 minutes as needed for Chest pain for 3 doses over 15 minutes Yes       omega-3-DHA-EPA-fish oil (FISH OIL) 1,000 mg (120 mg-180 mg) Oral Capsule Take by mouth Once a day Yes       raNITIdine (ZANTAC) 150 mg Oral Tablet Take 150 mg by mouth Once a day  Yes       sennosides-docusate sodium (SENOKOT-S) 8.6-50 mg Oral Tablet Take 1 Tab by mouth Twice daily Yes       sodium bicarbonate 650 mg Oral Tablet Take 650 mg by mouth Twice daily Yes       spironolactone (ALDACTONE) 50 mg Oral Tablet Take 25 mg by mouth Every morning with breakfast As needed with bumex for weight gain of 2 pounds Yes         Carlos American, PHARMD

## 2018-01-13 NOTE — Ancillary Notes (Signed)
Diabetes Education    Stopped to assess need for diabetes education. Patient refused to answer questions regarding PMH stating his sister [name] "takes care of everything she's my MPOA". No family present at bedside. Please consult DEC should specific education needs arise prior to discharge.    Lab Results   Component Value Date    HA1C 8.2 (H) 01/12/2018    HA1C 8.8 12/15/2017     Veda Canning BSN, RN.   [facility]  Ext: [phone]  Pager: [phone]

## 2018-01-13 NOTE — Care Management Notes (Signed)
[facility]  Care Management Note    Patient Name: [name]  Date of Birth:   Sex: male  Date/Time of Admission: 01/12/2018  8:24 PM  Room/Bed: 21/A  Payor: MEDICARE / Plan: MEDICARE PART A AND B / Product Type: Medicare /    LOS: 1 day   Primary Care Providers:  Nehemiah Settle, MD, MD (General)  Nehemiah Settle, MD, MD (Secondary Care)    Admitting Diagnosis:  CHF (congestive heart failure) (CMS [facility]) [I50.9]    Assessment:      01/13/18 1057   Assessment Details   Assessment Type Admission   Date of Care Management Update 01/13/18   Date of Next DCP Update 01/15/18   Readmission   Is this a readmission? No   Care Management Plan   Discharge Planning Status initial meeting   Projected Discharge Date 01/18/18   CM will evaluate for rehabilitation potential yes   Patient choice offered to patient/family yes   Form for patient choice reviewed/signed and on chart yes   Facility or Agency Preferences [facility]   Patient aware of possible cost for ambulance transport?  No   Discharge Needs Assessment   Outpatient/Agency/Support Group Needs skilled nursing facility   Equipment Currently Used at Home none   Equipment Needed After Discharge none   Discharge Facility/Level of Care Needs Nursing Facility-Primary Residence/Not Skilled (code 1)   Transportation Available ambulance;car;family or friend will provide   Referral Information   Admission Type inpatient   Address Verified verified-no changes   Arrived From nursing facility   Insurance Verified verified-no change   ADVANCE DIRECTIVES   Does the Patient have an Advance Directive? Yes, Patient Does Have Advance Directive for Healthcare Treatment   Type of Advance Directive Completed Medical Living Will;Medical Power of Attorney   Copy of Advance Directives in Chart? 2   Name of MPOA or Healthcare Surrogate Delijah Vaquera   Phone Number of MPOA or Healthcare Surrogate [phone]   Patient Requests Assistance in Having Advance  Directive Notarized. N/A   LAY CAREGIVER    Appointed Lay Caregiver? Yes   Lay Caregiver Name Prithvi Merrifield   Lay Caregiver Relationship to patient sibling   Geanie Cooley Caregiver Contact Number [phone]   Employment/Financial   Patient has Prescription Coverage?  Yes   Financial Concerns none   Living Environment   Select an age group to open "lives with" row.  Adult   Lives With facility resident   Living Arrangements *nursing home   Able to Return to Prior Arrangements yes   Home Safety   Home Assessment: No Problems Identified   Home Accessibility no concerns   Pt admitted with syncope.  Device interrogation pending.  [facility].  Pt says that is where he lives.  Freedom of Choice signed for [facility].  Will contact [facility] and verify bed hod and pt returning.  Anticipate return to [facility] nursing facility.  Will follow.    Discharge Plan:  Nursing Facility-Primary Residence/Not Skilled (code 1)      The patient will continue to be evaluated for developing discharge needs.     Case Manager: Deloria Lair, SOCIAL WORKER  Phone: [phone]

## 2018-01-13 NOTE — Nurses Notes (Signed)
01/13/18 0824   Vital Signs   Heart Rate 75   Respiratory Rate 18   BP (Non-Invasive) (!) 106/53   MAP (Non-Invasive) 70 mmHG   BP Source (Non-Invasive) C;Monitor;RA   Patient Position Semi-fowler     Dr John Giovanni notified of above BP at bedside. He said to hold the scheduled hydralazine. He said to give the Coreg and Imdur.

## 2018-01-13 NOTE — Care Plan (Addendum)
Admit day #1. VSS. Fall precautions maintained, call bell in reach, SS activated. Echo done today. Plan to DC to [place] rehab with hospice and palliative care.       Problem: Adult Inpatient Plan of Care  Goal: Plan of Care Review  Outcome: Ongoing (see interventions/notes)  Goal: Patient-Specific Goal (Individualization)  Outcome: Ongoing (see interventions/notes)  Flowsheets  Taken 01/13/2018 1440 by Linford Arnold, PHYSICAL THERAPY STUDENT  Individualized Care Needs: OOB to chair via maxi move   Patient-Specific Goals (Include Timeframe): to get back to rehab center he was at previously   Taken 01/13/2018 1905 by Shawnie Pons, RN  Anxieties, Fears or Concerns: Wants to leave.  Goal: Absence of Hospital-Acquired Illness or Injury  Outcome: Ongoing (see interventions/notes)  Goal: Optimal Comfort and Wellbeing  Outcome: Ongoing (see interventions/notes)  Goal: Rounds/Family Conference  Outcome: Ongoing (see interventions/notes)     Problem: Fall Injury Risk  Goal: Absence of Fall and Fall-Related Injury  Outcome: Ongoing (see interventions/notes)     Problem: Skin Injury Risk Increased  Goal: Skin Health and Integrity  Outcome: Ongoing (see interventions/notes)     Problem: Acute Rehab Services Goal & Intervention Plan  Goal: Bathing Goal  Description  Stand Alone Therapy Goal  Outcome: Ongoing (see interventions/notes)  Goal: Bed Mobility Goal  Description  Stand Alone Therapy Goal  Outcome: Ongoing (see interventions/notes)  Goal: Caregiver Training Goal  Description  Stand Alone Therapy Goal  Outcome: Ongoing (see interventions/notes)  Goal: Cognition Goal  Description  Stand Alone Therapy Goal  Outcome: Ongoing (see interventions/notes)  Goal: Cognition Goals, SLP  Description  Stand Alone Therapy Goal  Outcome: Ongoing (see interventions/notes)  Goal: Communication Goals, SLP  Description  Stand Alone Therapy Goal  Outcome: Ongoing (see interventions/notes)  Goal: Dysphagia Goals, SLP  Description  Stand  Alone Therapy Goal  Outcome: Ongoing (see interventions/notes)  Goal: Eating Self-Feeding Goal  Description  Stand Alone Therapy Goal  Outcome: Ongoing (see interventions/notes)  Goal: Gait Training Goal  Description  Stand Alone Therapy Goal  Outcome: Ongoing (see interventions/notes)  Goal: Grooming Goal  Description  Stand Alone Therapy Goal  Outcome: Ongoing (see interventions/notes)  Goal: Home Management Goal  Description  Stand Alone Therapy Goal  Outcome: Ongoing (see interventions/notes)  Goal: Interprofessional Goal  Description  Stand Alone Therapy Goal  Outcome: Ongoing (see interventions/notes)  Goal: LB Dressing Goal  Description  Stand Alone Therapy Goal  Outcome: Ongoing (see interventions/notes)  Goal: Occupational Therapy Goals  Description  Stand Alone Therapy Goal  Outcome: Ongoing (see interventions/notes)  Goal: Physical Therapy Goal  Description  Stand Alone Therapy Goal  Outcome: Ongoing (see interventions/notes)  Goal: Range of Motion Goal  Description  Stand Alone Therapy Goal  Outcome: Ongoing (see interventions/notes)  Goal: Strength Goal  Description  Stand Alone Therapy Goal  Outcome: Ongoing (see interventions/notes)  Goal: Toileting Goal  Description  Stand Alone Therapy Goal  Outcome: Ongoing (see interventions/notes)  Goal: Goal Transfer Training  Description  Stand Alone Therapy Goal  Outcome: Ongoing (see interventions/notes)  Goal: UB Dressing Goal  Description  Stand Alone Therapy Goal  Outcome: Ongoing (see interventions/notes)

## 2018-01-13 NOTE — Care Plan (Signed)
Problem: Adult Inpatient Plan of Care  Goal: Plan of Care Review  Flowsheets (Taken 01/13/2018 1115)  Plan of Care Reviewed With: patient; other (see comments)  Note:   Pt admitted with syncope.  Device interrogation pending.  Pt [facility] at [facility].  Pt says that is where he lives.  Freedom of Choice signed for [facility].  Will contact [facility] and verify bed hod and pt returning.  Anticipate return to [facility] nursing facility.  Will follow.

## 2018-01-14 LAB — CBC WITH DIFF
BASOPHIL #: <0.1 10*3/uL (ref <=0.20)
BASOPHIL %: 1 %
EOSINOPHIL #: 0.13 x10ˆ3/uL (ref <=0.50)
EOSINOPHIL %: 2 %
HCT: 40.5 % (ref 38.9–52.0)
HGB: 13.5 g/dL (ref 13.4–17.5)
IMMATURE GRANULOCYTE #: <0.1 x10ˆ3/uL (ref <0.10)
IMMATURE GRANULOCYTE %: 1 % (ref 0–1)
LYMPHOCYTE #: 1.06 x10ˆ3/uL (ref 1.00–4.80)
LYMPHOCYTE %: 17 %
MCH: 31 pg (ref 26.0–32.0)
MCHC: 33.3 g/dL (ref 31.0–35.5)
MCV: 92.9 fL (ref 78.0–100.0)
MONOCYTE #: 0.82 x10ˆ3/uL (ref 0.20–1.10)
MONOCYTE %: 13 %
MPV: 10.8 fL (ref 8.7–12.5)
NEUTROPHIL #: 4.23 x10ˆ3/uL (ref 1.50–7.70)
NEUTROPHIL %: 66 %
PLATELETS: 143 x10ˆ3/uL — ABNORMAL LOW (ref 150–400)
RBC: 4.36 x10ˆ6/uL — ABNORMAL LOW (ref 4.50–6.10)
RDW-CV: 14.9 % (ref 11.5–15.5)
WBC: 6.3 x10ˆ3/uL (ref 3.7–11.0)

## 2018-01-14 LAB — BASIC METABOLIC PANEL
ANION GAP: 9 mmol/L (ref 4–13)
BUN/CREA RATIO: 17 (ref 6–22)
BUN: 32 mg/dL — ABNORMAL HIGH (ref 8–25)
CALCIUM: 9.1 mg/dL (ref 8.5–10.2)
CHLORIDE: 104 mmol/L (ref 96–111)
CO2 TOTAL: 25 mmol/L (ref 22–32)
CREATININE: 1.87 mg/dL — ABNORMAL HIGH (ref 0.62–1.27)
ESTIMATED GFR: 35 mL/min/1.73mˆ2 — ABNORMAL LOW (ref >59)
GLUCOSE: 77 mg/dL (ref 65–139)
POTASSIUM: 4.2 mmol/L (ref 3.5–5.1)
SODIUM: 138 mmol/L (ref 136–145)
SODIUM: 138 mmol/L (ref 136–145)

## 2018-01-14 LAB — MAGNESIUM: MAGNESIUM: 2.1 mg/dL (ref 1.6–2.6)

## 2018-01-14 LAB — POC BLOOD GLUCOSE (RESULTS)
GLUCOSE, POC: 71 mg/dL (ref 70–105)
GLUCOSE, POC: 93 mg/dL (ref 70–105)
GLUCOSE, POC: 88 mg/dL (ref 70–105)
GLUCOSE, POC: 189 mg/dL — ABNORMAL HIGH (ref 70–105)
GLUCOSE, POC: 199 mg/dL — ABNORMAL HIGH (ref 70–105)

## 2018-01-14 LAB — PHOSPHORUS: PHOSPHORUS: 2.5 mg/dL (ref 2.3–4.0)

## 2018-01-14 MED ORDER — HYDRALAZINE 10 MG TABLET
10.0000 mg | ORAL_TABLET | Freq: Three times a day (TID) | ORAL | Status: DC
Start: 2018-01-14 — End: 2018-01-15
  Administered 2018-01-14: 10 mg via ORAL
  Administered 2018-01-14: 0 mg via ORAL
  Administered 2018-01-15: 10 mg via ORAL
  Filled 2018-01-14 (×6): qty 1

## 2018-01-14 MED ADMIN — nystatin 100,000 unit/gram topical powder: SUBCUTANEOUS | @ 21:00:00 | NDC 00574200815

## 2018-01-14 NOTE — Progress Notes (Signed)
[facility]  Cardiology   Progress Note      [name], [name]  Date of Admission:  01/12/2018  Date of service: 01/14/2018  Date of Birth:    MRN:  [id number]    Hospital Day:  LOS: 0 days      Subjective: Feeling well, resting calmly with no chest pain or dyspnea. No abdominal pain or leg pain. Wants to know when he can go home.     Objective:     Vital Signs:  Filed Vitals:    01/14/18 0805 01/14/18 0950 01/14/18 0951 01/14/18 1100   BP: 112/73 110/72 110/72    Pulse: 70 71 75    Resp: 18      Temp:    36.6 C (97.9 F)   SpO2: 100% 98%         Physical Exam:  -Constitutional: no acute distress, pleasant  -Eyes: conjunctiva clear, sclera non-icteric, EOMI  -Mouth: oral mucosa pink & moist  -Cardiac: RRR, audible S1 + S2, no M/R/G, JV pulsations seen at about clavicle while supine  -Respiratory: CTA bilaterally, no crackles/wheezing  -Extremities: no C/C/E  -Abdominal: soft, nondistended, NTTP, +BS  -Neurological: grossly normal, no focal deficits, A&Ox3  -Integument: no rashes, petechiae, or purpura    I/O:  I/O last 24 hours:      Intake/Output Summary (Last 24 hours) at 01/14/2018 1150  Last data filed at 01/14/2018 0900  Gross per 24 hour   Intake 270 ml   Output 650 ml   Net -380 ml     I/O current shift:  07/04 0700 - 07/04 1859  In: -   Out: 300 [Urine:300]    Labs: recent labs reviewed  - BNP = 1600 last night  - BMP: Cr = 1.87 <-- 2.11 last night, lytes largely WNL including bicarb  - Bili 1.6/0.6    Last echo 05/2017:  -EF:  27%, WMA score 2.0/global LV hypoK, mod pulm HTN    Current Medications:    Current Facility-Administered Medications:  apixaban (ELIQUIS) tablet 5 mg Oral 2x/day   aspirin (ECOTRIN) enteric coated tablet 81 mg 81 mg Oral Daily   carvedilol (COREG) tablet 3.125 mg Oral 2x/day-Food   cholecalciferol (VITAMIN D3) tablet 1,000 Units Oral Daily   donepezil (ARICEPT) tablet 10 mg Oral NIGHTLY   famotidine (PEPCID) tablet 20 mg Oral Daily   ferrous sulfate 324 mg (65 mg  elemental IRON) tablet 324 mg Oral EVERY OTHER DAY   folic acid (FOLVITE) tablet 1 mg Oral Daily   insulin glargine (LANTUS) 100 units/mL injection 20 Units Subcutaneous Daily   isosorbide mononitrate (IMDUR) 24 hr extended release tablet 30 mg Oral Daily   polyethylene glycol (MIRALAX) oral packet 17 g Oral Daily PRN   sennosides-docusate sodium (SENOKOT-S) 8.6-50mg  per tablet 1 Tab Oral 2x/day   SSIP insulin lispro (HUMALOG) 100 units/mL injection 2-6 Units Subcutaneous 4x/day PRN       Allergies   Allergen Reactions   . Lipitor [Atorvastatin]      Concerned about cognitive function.   . Pravachol [Pravastatin] Rash   . Amiodarone        -ASA:  Yes  -Beta Blocker:  Yes  -ACEI/ARB:  No, Elevated CR  -Statin:  No:  Allergy  -Antiplatelets (Plavix, Brilinta, Effient): No:  unclear but suspected >1 year from last stent  -Spironolactone Indicated (Heart Failure): No:  Other elevated creatinine  -DVT/PE Prophylaxis: Apixaban     ASSESSMENT/PLAN:  Active Hospital Problems  Diagnosis   . CHF (congestive heart failure) (CMS HCC)     77 y/o with dizziness while sitting up and walking, ICD interrogated found to be unremarkable for any events, was hypotensive on his cardiac meds with Dr. Dellia Beckwith. Attempting to reconcile medications with nursing home [facility] and uses [facility]) pharmacy. Likely send home on current regimen with diuretics only as PRN, definitely at risk for ESRD with ACE/ARB/Entresto.    Given neurocognitive deficit, he is NOT a suitable candidate for cardiac transplant or assist device (LVAD or other). I discussed case with sister who told me that back in November they were at least notified of 2 nearby hospice providers should be progress to requiring hospice and is amenable to palliative care re-involvement.    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  - Will add low dose hydralazine 10 mg TID.  - Will  DC schedule diuretics (was on torsemide and Bumex at home) and only do PRN torsemide 20 for 3 lbs/day or 5 lbs/2 day gain period.  - CRT-D device was interrogated showing normal pacemaker function, stable pacing thresholds, Ssable sensing thresholds, HV lead impedance greater than upper limit on December 23, 2017. Was discussed with EP, no further testing or treatment is recommended.   - Repeat echo showed EF 35%, no LV thrombus.  - Will discuss hospice with sister/patient when she comes today.    RUQ pain, unclear etiology: improving  - RUQ [place] largely without acute pathology    CHRONIC ILLNESSES  - Afib s/p AV nodal ablation: CHADSVASC = 6, HASBLED = 5, cont Coreg/Eliquis  - CKD3: avoid nephrotoxins/trend BMP/dose meds to GFR  - DM-2: SSI + Lantus 20  - Neurocognitive deficit: cont Aricept  - Vit D/iron deficiencies: cont supplementation  - GERD: cont Pepcid    - Diet: diabetic  - DVT prophylaxis: Eliquis  - DNR but OK to intubate    Disposition Planning: DC to nursing facility [facility] likely tomorrow but will need hospice discussion with daughter/patient first    Alyson Reedy, MD      I saw and examined the patient.  I reviewed the resident's note.  I agree with the findings and plan of care as documented in the resident's note.  Any exceptions/additions are edited/noted.    Multiple morbid disease states and dementia. Not in ADHF. Palliative care consult/hospice discussions.    Brayton Layman, DO

## 2018-01-14 NOTE — Ancillary Notes (Signed)
Patient is not appropriate for cardiac rehab at this time due to poor tolerance to PT eval. Patient will be for PT only until condition improves. Will continue to monitor for changes and follow up when appropriate for cardiac rehab.     Deeann Cree MS EP  [phone]

## 2018-01-15 LAB — POC BLOOD GLUCOSE (RESULTS)
GLUCOSE, POC: 65 mg/dL — ABNORMAL LOW (ref 70–105)
GLUCOSE, POC: 98 mg/dL (ref 70–105)
GLUCOSE, POC: 164 mg/dL — ABNORMAL HIGH (ref 70–105)

## 2018-01-15 MED ORDER — HYDRALAZINE 10 MG TABLET
10.00 mg | ORAL_TABLET | Freq: Three times a day (TID) | ORAL | Status: AC
Start: 2018-01-15 — End: ?

## 2018-01-15 MED ORDER — TORSEMIDE 20 MG TABLET
20.00 mg | ORAL_TABLET | Freq: Every day | ORAL | Status: AC | PRN
Start: 2018-01-15 — End: ?

## 2018-01-15 MED ADMIN — cholecalciferol (vitamin D3) 25 mcg (1,000 unit) tablet: ORAL | @ 08:00:00

## 2018-01-15 NOTE — Progress Notes (Addendum)
[facility]  Cardiology   Progress Note      [name], [name]  Date of Admission:  01/12/2018  Date of service: 01/15/2018  Date of Birth:    MRN:  [id number]    Hospital Day:  LOS: 0 days      Subjective: No chest pain or dyspnea, no abdominal pain, eating well. Wants to go home.  Objective:     Vital Signs:  Filed Vitals:    01/14/18 1551 01/14/18 1939 01/15/18 0003 01/15/18 0410   BP: (!) 106/56 96/73 110/63 (!) 102/55   Pulse: 77 70 70 73   Resp: 16 14 12 14    Temp:  36.7 C (98 F) 36.4 C (97.5 F) 36.6 C (97.9 F)   SpO2: 98% (!) 88% 99% 100%       Physical Exam:  -Constitutional: no acute distress, pleasant  -Eyes: conjunctiva clear, sclera non-icteric, EOMI  -Mouth: oral mucosa pink & moist  -Cardiac: RRR, audible S1 + S2, no M/R/G, JV pulsations seen at about clavicle while supine  -Respiratory: CTA bilaterally, no crackles/wheezing  -Extremities: no C/C/E  -Abdominal: soft, nondistended, NTTP, +BS  -Neurological: grossly normal, no focal deficits, A&Ox3  -Integument: no rashes, petechiae, or purpura    I/O:  I/O last 24 hours:      Intake/Output Summary (Last 24 hours) at 01/15/2018 0703  Last data filed at 01/15/2018 0400  Gross per 24 hour   Intake 1200 ml   Output 900 ml   Net 300 ml     I/O current shift:  No intake/output data recorded.    Labs: recent labs reviewed  -Sugars acceptable range    Last echo 05/2017:  -EF:  27%, WMA score 2.0/global LV hypoK, mod pulm HTN    Current Medications:    Current Facility-Administered Medications:  apixaban (ELIQUIS) tablet 5 mg Oral 2x/day   aspirin (ECOTRIN) enteric coated tablet 81 mg 81 mg Oral Daily   carvedilol (COREG) tablet 3.125 mg Oral 2x/day-Food   cholecalciferol (VITAMIN D3) tablet 1,000 Units Oral Daily   donepezil (ARICEPT) tablet 10 mg Oral NIGHTLY   famotidine (PEPCID) tablet 20 mg Oral Daily   ferrous sulfate 324 mg (65 mg elemental IRON) tablet 324 mg Oral EVERY OTHER DAY   folic acid (FOLVITE) tablet 1 mg Oral Daily      hydrALAZINE (APRESOLINE) tablet 10 mg Oral Q8HRS   insulin glargine (LANTUS) 100 units/mL injection 20 Units Subcutaneous Daily   isosorbide mononitrate (IMDUR) 24 hr extended release tablet 30 mg Oral Daily   polyethylene glycol (MIRALAX) oral packet 17 g Oral Daily PRN   sennosides-docusate sodium (SENOKOT-S) 8.6-50mg  per tablet 1 Tab Oral 2x/day   SSIP insulin lispro (HUMALOG) 100 units/mL injection 2-6 Units Subcutaneous 4x/day PRN       Allergies   Allergen Reactions   . Lipitor [Atorvastatin]      Concerned about cognitive function.   . Pravachol [Pravastatin] Rash   . Amiodarone        -ASA:  Yes  -Beta Blocker:  Yes  -ACEI/ARB:  No, Elevated CR  -Statin:  No:  Allergy  -Antiplatelets (Plavix, Brilinta, Effient): No:  unclear but suspected >1 year from last stent  -Spironolactone Indicated (Heart Failure): No:  Other elevated creatinine  -DVT/PE Prophylaxis: Apixaban     ASSESSMENT/PLAN:  Active Hospital Problems    Diagnosis   . CHF (congestive heart failure) (CMS HCC)     77 y/o with dizziness while sitting up and walking,  ICD interrogated found to be unremarkable for any events, was hypotensive on his cardiac meds. Improved on adjusted regimen with hydralazine ordered but off diuretics including aldactone. WIll arrange hospice care per desire of sister/patient with assistance of care management.    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  - Cont hydralazine 10 mg TID, Coreg 3.125 BID, IMDUR 30  - Will DC schedule diuretics (was on torsemide and Bumex at home) and only do PRN torsemide 20 for 3 lbs/day or 5 lbs/2 day gain period.  - Home to [facility] with hospice today    RUQ pain, unclear etiology: improving  - RUQ [place] largely without acute pathology    CHRONIC ILLNESSES  - Afib s/p AV nodal ablation: CHADSVASC = 6, HASBLED = 5, cont Coreg/Eliquis  - CKD3: avoid nephrotoxins/trend BMP/dose meds to  GFR  - DM-2: SSI + Lantus 20  - Neurocognitive deficit: cont Aricept  - Vit D/iron deficiencies: cont supplementation  - GERD: cont Pepcid    - Diet: diabetic  - DVT prophylaxis: Eliquis  - DNR but OK to intubate    Disposition Planning: DC to [facility] with plans to arrange hospice    Clayton Lefort  PGY-3  [facility] Internal Medicine  Pager [phone]        I saw and examined the patient.  I reviewed the resident's note.  I agree with the findings and plan of care as documented in the resident's note.  Any exceptions/additions are edited/noted.    Primary diagnosis: Failure to thrive. NOT HF    Brayton Layman, DO

## 2018-01-15 NOTE — Discharge Instructions (Signed)
Discharge Recommendations/ Plan:Discharge KX:FGHWEXH Facility-Primary Residence/Not Skilled (code 1)      Resources: Please call report to [facility]  [phone]

## 2018-01-15 NOTE — Ancillary Notes (Signed)
Chart reviewed.  Patient not appropriate at this time; due to condition and Physical Therapy evaluation patient is appropriate for Physical Therapy only. Will continue to monitor and discuss with Physical Therapy. Per Physical Therapy when the patient is able to fully participate and benefit from Cardiac Rehabilitation we will assist nurses, CA staffing, Physical Therapy in ambulation as schedule allows.    ** See Cardiac Exercise Flowsheet/tab for further details **    Royal Hawthorn II MS CES EP-C  [facility] Medicine - Cardiac Rehabilitation  [phone]

## 2018-01-15 NOTE — Care Management Notes (Signed)
[facility]  Care Management Note    Patient Name: [name]  Date of Birth:   Sex: male  Date/Time of Admission: 01/12/2018  8:24 PM  Room/Bed: 21/A  Payor: MEDICARE / Plan: MEDICARE PART A AND B / Product Type: Medicare /    LOS: 0 days   Primary Care Providers:  Nehemiah Settle, MD, MD (General)  Nehemiah Settle, MD, MD (Secondary Care)    Admitting Diagnosis:  CHF (congestive heart failure) (CMS [facility]) [I50.9]  CHF (congestive heart failure) (CMS HCC) [I50.9]    Assessment:      01/15/18 0848   Assessment Details   Assessment Type Continued Assessment   Date of Care Management Update 01/15/18   Date of Next DCP Update 01/18/18   Care Management Plan   Discharge Planning Status plan in progress   Projected Discharge Date 01/15/18   Discharge Needs Assessment   Discharge Facility/Level of Care Needs SNF Return (Medicare certified)(code 3)   Per service, pt is d/c ready.  Call to [facility] and [facility] and left message with Marcelino Duster in admissions.  Will await return call.      Discharge Plan:  SNF Return (Medicare certified) (code 3)      The patient will continue to be evaluated for developing discharge needs.     Case Manager: Deloria Lair, SOCIAL WORKER  Phone: [phone]

## 2018-01-15 NOTE — Discharge Summary (Addendum)
[facility]  DISCHARGE SUMMARY    PATIENT NAME:  [name], [name]  MRN:  [id number]  DOB:      ENCOUNTER DATE:  01/12/2018  INPATIENT ADMISSION DATE:   DISCHARGE DATE:  01/15/2018    ATTENDING PHYSICIAN: Brayton Layman, DO  SERVICE: CARDIOLOGY BLUE  PRIMARY CARE PHYSICIAN: Nehemiah Settle, MD         LAY CAREGIVER: Lay Caregiver Name: Katherina Right, Lay Caregiver Contact Number: [phone], Lay Caregiver Relationship to patient: sibling      PRIMARY DISCHARGE DIAGNOSIS: FAilure to thrive    Active Non-Hospital Problems    Diagnosis Date Noted   . Acute on chronic systolic heart failure, NYHA class 4 (CMS HCC) 05/27/2017   . Hypokalemia 05/24/2017   . 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 Bilateral lower extremity edema 09/16/2016   . Diarrhea 07/24/2016   . Persistent atrial fibrillation (CMS HCC) 04/10/2016   . Stage 3 chronic kidney disease (CMS HCC) 11/07/2014   . Systolic congestive heart failure, NYHA class 2 (CMS HCC) 10/02/2014   . CKD (chronic kidney disease) stage III 06/21/2014   . Anemia 06/21/2014   . Colon polyps 06/21/2014   . Vitamin B deficiency 06/21/2014   . Piriformis syndrome 06/21/2014   . Hyperlipidemia 06/21/2014   . Atrial fibrillation with RVR (CMS HCC) 01/09/2014   . 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 Hypotension 12/08/2013   . Ischemic cardiomyopathy 10/24/2013   . CHF (NYHA class II, ACC/AHA stage C) (CMS HCC) 10/24/2013   . Atrial fibrillation (CMS HCC) 08/25/2013   . Congestive heart failure with LV diastolic dysfunction, NYHA class 1 (CMS HCC) 06/29/2012   . PAF (paroxysmal atrial fibrillation) (CMS HCC) 11/05/2011   . CAD (coronary artery disease) 09/25/2011   . S/P angioplasty with stent 09/25/2011   . Altered mental status 09/04/2011   . Elevated troponin 08/29/2011   . Essential hypertension 08/29/2011   . Type 2 diabetes mellitus with renal complication (CMS HCC) 08/29/2011   . LBBB (left  bundle branch block) 08/28/2011        DISCHARGE MEDICATIONS:     Current Discharge Medication List      START taking these medications.      Details   hydrALAZINE 10 mg Tablet  Commonly known as:  APRESOLINE   10 mg, Oral, EVERY 8 HOURS (SCHEDULED)  Refills:  0     torsemide 20 mg Tablet  Commonly known as:  DEMADEX   20 mg, Oral, DAILY PRN  Refills:  0        CONTINUE these medications - NO CHANGES were made during your visit.      Details   apixaban 5 mg Tablet  Commonly known as:  ELIQUIS   5 mg, Oral, 2 TIMES DAILY  Refills:  0     aspirin 81 mg Tablet, Delayed Release (E.C.)  Commonly known as:  ECOTRIN   81 mg, Oral, DAILY  Refills:  0     calcitriol 0.25 mcg Capsule  Commonly known as:  ROCALTROL   0.25 mcg, Oral, DAILY, On Monday/Wednesday/Friday  Refills:  0     carvedilol 3.125 mg Tablet  Commonly known as:  COREG   3.125 mg, Oral, 2 TIMES DAILY WITH FOOD  Qty:  180 Tab  Refills:  3     cholecalciferol (vitamin D3) 1,000 unit Tablet   1,000 Units, Oral, DAILY  Refills:  0  CO Q-10 100 mg Capsule  Generic drug:  coenzyme Q10   100 mg, Oral, EVERY EVENING WITH DINNER  Refills:  0     donepezil 10 mg Tablet  Commonly known as:  ARICEPT   10 mg, Oral, NIGHTLY  Refills:  0     Ferrous Sulfate 142 mg (45 mg iron) Tablet Sustained Release   Oral  Refills:  0     FISH OIL 1,000 mg (120 mg-180 mg) Capsule  Generic drug:  omega-3-DHA-EPA-fish oil   Oral, DAILY  Refills:  0     folic acid 1 mg Tablet  Commonly known as:  FOLVITE   1 mg, DAILY  Refills:  0     insulin glargine 100 unit/mL injection (vial)  Commonly known as:  LANTUS   30 Units, Subcutaneous, DAILY  Refills:  0     insulin R human 100 units/mL Injectable  Commonly known as:  HUMULIN R   5 Units, Subcutaneous, 4 TIMES DAILY PRN  Refills:  0     isosorbide mononitrate 30 mg Tablet Sustained Release 24 hr  Commonly known as:  IMDUR   30 mg, Oral, DAILY  Refills:  0     nitroGLYCERIN 0.4 mg Tablet, Sublingual  Commonly known as:  NITROSTAT   0.4 mg,  Sublingual, EVERY 5 MIN PRN, for 3 doses over 15 minutes   Refills:  0     raNITIdine 150 mg Tablet  Commonly known as:  ZANTAC   150 mg, Oral, DAILY  Refills:  0     sennosides-docusate sodium 8.6-50 mg Tablet  Commonly known as:  SENOKOT-S   1 Tab, Oral, 2 TIMES DAILY  Refills:  0     sodium bicarbonate 650 mg Tablet   650 mg, Oral, 2 TIMES DAILY  Refills:  0        STOP taking these medications.    bumetanide 0.5 mg Tablet  Commonly known as:  BUMEX     spironolactone 50 mg Tablet  Commonly known as:  ALDACTONE          Discharge med list refreshed?  YES    @HVIDCCARDMEDS @    During this hospitalization did the patient have an AMI, PCI/PCTA, STENT or Isolated CABG?  No              Heart Failure Discharge Labs  First admission (Creatinine): 2.11  Discharge Creatinine: Lab Results     Component                Value               Date                     CREATININE               1.87 (H)            01/14/2018               CREATININE               2.5                 12/15/2017            First admission (BUN)(BUN/Creatinine ration):   Discharge: BUN     Date                     Value  Ref Range           Status              01/14/2018               32 (H)              8 - 25 mg/dL        Final               12/15/2017               <2                                      Final          ----------      ALLERGIES:  Allergies   Allergen Reactions   . Lipitor [Atorvastatin]      Concerned about cognitive function.   . Pravachol [Pravastatin] Rash   . Amiodarone          Weight:   Admit:  Weight: 88.1 kg (194 lb 3.6 oz) Discharge: Wt Readings from Last 1 Encounters:01/15/18 : 89.8 kg (197 lb 15.6 oz)         HOSPITAL PROCEDURE(S):   Bedside Procedures:  Orders Placed This Encounter   Procedures   . BEDSIDE  EP DEVICE INTERROGATION     Surgical     REASON FOR HOSPITALIZATION AND HOSPITAL COURSE     BRIEF HPI:  This is a 77 y.o., male with PMH mild dementia, afib, and severe HfREF w/ ICD who was seen at  clinic with Dr. Dellia Beckwith, noted to be be having dizziness worse when going from supine to standing/sitting. BP was 75/53 in clinic so he was sent here for titration of medications and possible palliative care consultation.    BRIEF HOSPITAL NARRATIVE: We held his aldactone and Bumex but continued his low dose Coreg/IMDUR, MAPs were in adequate range in 60-70's and appeared euvolemic on exam not needing diuresis. We did echo showing slight improvement of EF from before (35% <-- 27%). Given his dementia he is not an appropriate LVAD/transplant candidate, which was understood by his sister MPOA [name]. We discussed his goals of care with sister and patient and they both agreed they would not want him to have repeated hospital admissions and that hospice was the best option for him regarding his personal life goals. He was sent back to [facility] nursing home with plans to arrange hospice.    -CONTINUE: Coreg and IMDUR  -STOPPED: Bumex and aldactone  -NEW MEDS:   1) hydralazine 10 Q8H  2) torsemide PRN for weight gain as listed above     TRANSITION/POST DISCHARGE CARE/PENDING TESTS/REFERRALS:   - With Dr. Dellia Beckwith        CONDITION ON DISCHARGE:  A. Ambulation: Full ambulation  B. Self-care Ability: Complete  C. Cognitive Status Alert and Oriented x 3  D. Code status at discharge:   Code Status Information     Code Status    No CPR                 LINES/DRAINS/WOUNDS AT DISCHARGE:   Patient Lines/Drains/Airways Status    Active Line / Dialysis Catheter / Dialysis Graft / Drain / Airway / Wound     Name: Placement date: Placement time: Site: Days:    Peripheral IV Ultrasound guided Right;Lower Cephalic  (lateral  side of arm)  01/12/18   2124   2    Wound (Non-Surgical) Right;Outer Foot  05/20/17   0800   240                DISCHARGE DISPOSITION:  Nursing Home: Was there a discussion about any advance care planning, Healthcare Surrogate, POST form or DNR card with the patient or family?  Yes  Previous plans/forms updated or new  forms completed.               DISCHARGE INSTRUCTIONS:    No discharge procedures on file.         Clayton Lefort  PGY-3  [facility] Internal Medicine  Pager [phone]        Copies sent to Care Team       Relationship Specialty Notifications Start End    Nehemiah Settle, MD PCP - General EXTERNAL  11/21/13     Phone: [phone] Fax: [phone]         [address] [place] [address]    Nehemiah Settle, MD PCP - Secondary Care EXTERNAL  11/11/13     Phone: [phone] Fax: [phone]         [address] [place] [address]          Referring providers can utilize [email or web address] to access their referred [facility] patient's information.                    I saw and examined the patient.  I reviewed the resident's note.  I agree with the findings and plan of care as documented in the resident's note.  Any exceptions/additions are edited/noted.    Primary diagnosis: Failure to thrive. This was NOT a HF hospitalization.    Brayton Layman, DO

## 2018-01-15 NOTE — Nurses Notes (Signed)
Results for AKEIL, STEER (MRN [id number]) as of 01/15/2018 06:21   Ref. Range 01/15/2018 06:01   GLUCOSE, POC Latest Ref Range: 70 - 105 mg/dl 65 (L)       Blood glucose as above. Patient asymptomatic. Patient given orange juice and graham crackers. Will recheck @ 0630.

## 2018-01-15 NOTE — Care Management Notes (Signed)
[facility]  Care Management Note    Patient Name: [name]  Date of Birth:   Sex: male  Date/Time of Admission: 01/12/2018  8:24 PM  Room/Bed: 21/A  Payor: MEDICARE / Plan: MEDICARE PART A AND B / Product Type: Medicare /    LOS: 0 days   Primary Care Providers:  Nehemiah Settle, MD, MD (General)  Nehemiah Settle, MD, MD (Secondary Care)    Admitting Diagnosis:  CHF (congestive heart failure) (CMS [facility]) [I50.9]  CHF (congestive heart failure) (CMS [facility]) [I50.9]    Assessment:      01/15/18 1529   Assessment Details   Assessment Type Continued Assessment   Date of Care Management Update 01/15/18   Date of Next DCP Update 01/18/18   Readmission   Is this a readmission? No   Care Management Plan   Discharge Planning Status discharge plan complete   Projected Discharge Date 01/15/18   Discharge Needs Assessment   Discharge Facility/Level of Care Needs SNF Return (Medicare certified)(code 3)   Per service, pt is d/c ready to return to [facility] with Hospice.  Fellow from Cardiology [facility] spoke to pts sister yesterday recommending Hospice.  Sister agreed.  Discussed pts return with Marcelino Duster in admissions at [facility].  She will call pts sister regarding which Hospice to use.  Ambulance arrangements made for 1pm today.  AVS updated with number for report.  Nurse aware of plan.  D/C packet sent with pt.  D/C back to [facility].      Discharge Plan:  SNF Return (Medicare certified) (code 3)      The patient will continue to be evaluated for developing discharge needs.     Case Manager: Deloria Lair, SOCIAL WORKER  Phone: [phone]

## 2018-01-15 NOTE — Care Management Notes (Signed)
[facility]  Care Management Note    Patient Name: [name]  Date of Birth:   Sex: male  Date/Time of Admission: 01/12/2018  8:24 PM  Room/Bed: 21/A  Payor: MEDICARE / Plan: MEDICARE PART A AND B / Product Type: Medicare /    LOS: 0 days   Primary Care Providers:  Nehemiah Settle, MD, MD (General)  Nehemiah Settle, MD, MD (Secondary Care)    Admitting Diagnosis:  CHF (congestive heart failure) (CMS HCC) [I50.9]  CHF (congestive heart failure) (CMS HCC) [I50.9]    Assessment:   I did call pts sister informing her of d/c today and she confirmed that she is agreeable with Hospice.      Discharge Plan:  SNF Return (Medicare certified) (code 3)      The patient will continue to be evaluated for developing discharge needs.     Case Manager: Deloria Lair, SOCIAL WORKER  Phone: [phone]

## 2018-01-15 NOTE — Nurses Notes (Signed)
Patient ordered discharge to [facility]. Report called to [phone]. IV line removed and tip intact. Reviewed hospital course, diet, activity, medications and follow up appointment with the patient. Patient denies questions/concerns regarding discharge. Patient taken by EMS, discharge packet/DNR card given to EMS, report given to EMS squad.

## 2018-01-15 NOTE — Nurses Notes (Signed)
Per telemetry, patient had a 6 beat run of vtach. Patient was asleep and asymptomatic. Card Blue resident notified.

## 2018-01-18 ENCOUNTER — Other Ambulatory Visit (HOSPITAL_COMMUNITY): Payer: Self-pay | Admitting: Family

## 2018-01-18 DIAGNOSIS — I255 Ischemic cardiomyopathy: Secondary | ICD-10-CM

## 2018-01-20 NOTE — Care Management Notes (Signed)
Referral Information  ++++++ Placed Provider #1 ++++++  Case Manager: Beth Bedilion  Provider Type: Nursing Home/SNF-Return  Provider Name: [facility]  Address:  [address]  [address]  [place], [place] [address]  Contact:    Fax:   Fax:

## 2018-03-22 ENCOUNTER — Ambulatory Visit: Payer: Medicare Other | Attending: Internal Medicine

## 2018-03-22 DIAGNOSIS — I4891 Unspecified atrial fibrillation: Principal | ICD-10-CM | POA: Insufficient documentation

## 2018-03-22 DIAGNOSIS — Z95810 Presence of automatic (implantable) cardiac defibrillator: Secondary | ICD-10-CM

## 2018-03-22 DIAGNOSIS — I447 Left bundle-branch block, unspecified: Secondary | ICD-10-CM | POA: Insufficient documentation

## 2018-03-22 DIAGNOSIS — Z4502 Encounter for adjustment and management of automatic implantable cardiac defibrillator: Secondary | ICD-10-CM | POA: Insufficient documentation

## 2018-03-22 DIAGNOSIS — I509 Heart failure, unspecified: Secondary | ICD-10-CM | POA: Insufficient documentation

## 2018-03-22 NOTE — Procedures (Signed)
Patient's ICD was checked by remote transmission and reviewed by Renaldo Harrison, PA-C.    Patient's ICD was evaluated, typically including programmed   parameters, lead(s), battery, capture, sensing function(s), presence or   absence of therapies, and underlying heart rhythm. Often but not always   this includes sensor rate response, lower and upper heart rates, AV   intervals, pacing voltage and pulse duration, sensing value, and   diagnostics.  Please see programmer printout for details (scanned document).    SJM #[id number]  CRT-D.  Battery status etr=1.7-2.7 years.  On Eliquis for TEP.  No recent vt/vf episodes.  No new alerts.  Normal device check.  Mariana Arn, RN 03/22/2018      PACEMAKER, DEFIBRILLATOR, OR LOOP RECORDER EVALUATION  Order:  See patient's medical record.  Procedure Description:  Standard programming evaluation using pacemaker programmer or standard analysis of remote monitoring.  Data recording:  See printouts, where available, for details.  Physician Analysis and Reporting:  Unremarkable device analysis.  No major abnormalities, except as otherwise noted, if applicable.  Plan:  Plan routine follow-up, as appropriate, or unless otherwise indicated.  Please see notes and/or scanned printouts (where available) for details.  Hazle Quant, MD  Cardiac Electrophysiology  [facility]

## 2018-03-24 ENCOUNTER — Ambulatory Visit (INDEPENDENT_AMBULATORY_CARE_PROVIDER_SITE_OTHER): Payer: Medicare Other | Admitting: Nurse Practitioner

## 2018-03-24 VITALS — BP 120/68 | HR 80 | Resp 17 | Ht 72.0 in | Wt 240.0 lb

## 2018-03-24 DIAGNOSIS — N183 Chronic kidney disease, stage 3 unspecified (CMS HCC): Secondary | ICD-10-CM

## 2018-03-24 DIAGNOSIS — N2581 Secondary hyperparathyroidism of renal origin: Secondary | ICD-10-CM

## 2018-03-24 DIAGNOSIS — R809 Proteinuria, unspecified: Secondary | ICD-10-CM

## 2018-03-24 DIAGNOSIS — I129 Hypertensive chronic kidney disease with stage 1 through stage 4 chronic kidney disease, or unspecified chronic kidney disease: Secondary | ICD-10-CM

## 2018-03-24 DIAGNOSIS — E1122 Type 2 diabetes mellitus with diabetic chronic kidney disease: Secondary | ICD-10-CM

## 2018-03-24 DIAGNOSIS — E8770 Fluid overload, unspecified: Secondary | ICD-10-CM

## 2018-03-24 LAB — ENTER/EDIT NEPHROLOGY EXTERNAL LABS
ALBUMIN (SERUM): 3.4
B-TYPE NATRIURETIC PEPTIDE: 991
BUN/CREAT RATIO: 13.68
BUN/CREAT RATIO: 23.64
BUN: 26
BUN: 78
CALCIUM: 9.1
CARBON DIOXIDE: 26
CARBON DIOXIDE: 28
CHLORIDE: 105
CHLORIDE: 85
CREATININE: 1.9
CREATININE: 3.3
ESTIMATED GLOMERULAR FILTRATION RATE: 18
ESTIMATED GLOMERULAR FILTRATION RATE: 35
GLUCOSE, FASTING: 102
GLUCOSE, FASTING: 354
GLUCOSE, FASTING: 354
POTASSIUM: 3.4
POTASSIUM: 4.5
SODIUM: 131
SODIUM: 142
TOTAL PROTEIN: 5.6

## 2018-03-27 ENCOUNTER — Encounter (INDEPENDENT_AMBULATORY_CARE_PROVIDER_SITE_OTHER): Payer: Self-pay | Admitting: Nurse Practitioner

## 2018-03-29 NOTE — Progress Notes (Signed)
 PATIENT NAME: [place] NUMBER:  [id number]  DATE OF SERVICE: 03/24/2018  DATE OF BIRTH:      CLINIC NOTE    IDENTIFICATION:  [name] is a 77 year old male who currently resides at [facility] in [place], [place].  He returns today accompanied by a staff member from [facility].  He is here for follow up of CKD.  The patient has a complicated medical history including CAD status post PCI, ischemic cardiomyopathy, systolic and diastolic heart failure, atrial fibrillation, hypertension, DM 2, dementia, BPH, and remote history of kidney stones. The patient has severe multivessel CAD, but was determined to be a poor candidate for intervention.  He has had multiple hospitalizations for fluid overload.  Creatinine has fluctuated quite a bit, between 1.9 to 2.6.  Most recent creatinine is 3.3.    SUBJECTIVE:  The patient reports having gained about 40 pounds over the last few weeks (confirmed by daily weights from [facility]).  He reports a baseline weight between 190 and 200 pounds.  His current weight is 240 pounds.  He arrived in a wheelchair and states he is unable to ambulate due to so much fluid in his legs.  He gets quite short of breath with minimal exertion.  He denies chest pain.  He denies palpitations.  He has orthopnea.  Since his last visit, he was having significant problems with hypotension and dizziness.  In July, he was admitted to [facility].  He was discharged on hospice services.  His appetite remains good.  He denies nausea, vomiting or dysgeusia.  He has good urine output per his account.  He is on fluid restriction 1600 mL a day and a low-sodium diet.  He denies dysuria, hematuria or flank pain.  He does not know what his blood pressures have been running at [facility] but does report glucose levels to be "pretty good."  In the past and again today, he has expressed that he is not interested in dialysis should his kidneys decline to that point.      Allergies   Allergen Reactions   . Lipitor [Atorvastatin]      Concerned about cognitive function.   . Pravachol [Pravastatin] Rash   . Amiodarone      Current Outpatient Medications   Medication Sig   . apixaban (ELIQUIS) 5 mg Oral Tablet Take 1 Tab (5 mg total) by mouth Twice daily   . aspirin (ECOTRIN) 81 mg Oral Tablet, Delayed Release (E.C.) Take 81 mg by mouth Once a day   . bumetanide (BUMEX) 0.5 mg Oral Tablet Take 0.5 mg by mouth Once a day   . calcitriol (ROCALTROL) 0.25 mcg Oral Capsule Take 0.25 mcg by mouth Once a day On Monday/Wednesday/Friday   . carvedilol (COREG) 3.125 mg Oral Tablet Take 1 Tab (3.125 mg total) by mouth Twice daily with food   . cholecalciferol, vitamin D3, 1,000 unit Oral Tablet Take 1,000 Units by mouth Once a day   . coenzyme Q10 (CO Q-10) 100 mg Oral Capsule Take 100 mg by mouth Every evening with dinner   . donepezil (ARICEPT) 10 mg Oral Tablet Take 10 mg by mouth Every night   . Ferrous Sulfate 142 mg (45 mg iron) Oral Tablet Sustained Release Take by mouth   . folic acid (FOLVITE) 1 mg Oral Tablet Take 1 mg by mouth Once a day   . hydrALAZINE (APRESOLINE) 10 mg Oral Tablet Take 1 Tab (10 mg total) by  mouth Every 8 hours   . insulin glargine (LANTUS) 100 unit/mL Subcutaneous injection (vial) 30 Units by Subcutaneous route Once a day    . insulin R human (HUMULIN R) 100 units/mL Subcutaneous Injectable 5 Units by Subcutaneous route Four times a day as needed    . isosorbide mononitrate (IMDUR) 30 mg Oral Tablet Sustained Release 24 hr Take 1 Tab (30 mg total) by mouth Once a day   . nitroGLYCERIN (NITROSTAT) 0.4 mg Sublingual Tablet, Sublingual 0.4 mg by Sublingual route Every 5 minutes as needed for Chest pain for 3 doses over 15 minutes   . omega-3-DHA-EPA-fish oil (FISH OIL) 1,000 mg (120 mg-180 mg) Oral Capsule Take by mouth Once a day   . raNITIdine (ZANTAC) 150 mg Oral Tablet Take 150 mg by mouth Once a day    . sennosides-docusate sodium (SENOKOT-S) 8.6-50 mg Oral  Tablet Take 1 Tab by mouth Twice daily   . sodium bicarbonate 650 mg Oral Tablet Take 650 mg by mouth Twice daily   . torsemide (DEMADEX) 20 mg Oral Tablet Take 1 Tab (20 mg total) by mouth Once per day as needed (Only if gains 3 pounds in one day, or 5 pounds over 48 hours)         OBJECTIVE:  Vital signs:  Blood pressure 120/68, pulse 80, respirations 17, height 6 feet, weight 240 pounds, BMI 32.55 kg/m2.  General:  Alert and very pleasant 77 year old male who arrived via wheelchair.  He is chronically ill-appearing but in no acute distress.  Head:  Normocephalic.  Eyes:  No scleral icterus.  Mouth:  Mucous membranes are moist.  Neck:  Supple.  Heart: Regular rate and rhythm.  Lungs:  Clear with diminished air movement throughout.  No wheezes or rales appreciated.  He is mildly dyspneic with conversation.  Abdomen:  Distended, somewhat taut.  Extremities:  He has 3+ edema from his toes to his upper thighs and also extending into his abdomen to his sternum.  Skin:  Warm and dry, good color.  No jaundice.  Neuro:  He is alert and oriented to person, place, time, and situation.  Psych:  Very pleasant and appropriate.     Office Visit on 03/24/2018   Component Date Value Ref Range Status   . SODIUM 02/19/2018 142   Final   . POTASSIUM 02/19/2018 4.5   Final   . CHLORIDE 02/19/2018 105   Final   . CARBON DIOXIDE 02/19/2018 26   Final   . BUN 02/19/2018 26   Final   . CREATININE 02/19/2018 1.9   Final   . BUN/CREAT RATIO 02/19/2018 13.68   Final   . ESTIMATED GLOMERULAR FILTRATION RA* 02/19/2018 35   Final   . GLUCOSE, FASTING 02/19/2018 102   Final   . CALCIUM 02/19/2018 9.1   Final   . TOTAL PROTEIN 02/19/2018 5.6   Final   . ALBUMIN (SERUM) 02/19/2018 3.4   Final   . B-TYPE NATRIURETIC PEPTIDE 02/19/2018 991.0   Final   . SODIUM 02/28/2018 131   Final   . POTASSIUM 02/28/2018 3.4   Final   . CHLORIDE 02/28/2018 85   Final   . CARBON DIOXIDE 02/28/2018 28   Final   . BUN 02/28/2018 78   Final   . CREATININE 02/28/2018  3.3   Final   . BUN/CREAT RATIO 02/28/2018 23.64   Final   . ESTIMATED GLOMERULAR FILTRATION RA* 02/28/2018 18   Final   . GLUCOSE, FASTING 02/28/2018 354  Final   Admission on 01/12/2018, Discharged on 01/15/2018   Component Date Value Ref Range Status   . BNP 01/12/2018 1,642* <=100 pg/mL Final    Typical clinically relevant cutoffs for BNP are:                         <100 pg/mL - Heart failure unlikely in most patients.  100-400 pg/mL - Heart failure is possible, clinical interpretation required.  >400 pg/mL - Heart failure is likely in appropriate clinical setting.                                                                                                                                                   BNP should not be monitored in patients requiring intensive dialysis.  Age and sex specific cutoffs are not recommended in recent guidelines.  Cutoffs may need adjusted for obese patients. Reference: Mant et al. Lelon Frohlich Int  Med [phone]   . PROTHROMBIN TIME 01/12/2018 19.5* 9.5 - 14.1 seconds Final   . INR 01/12/2018 1.68* 0.80 - 1.20 Final   . ALBUMIN 01/12/2018 4.1  3.4 - 4.8 g/dL Final   . ALKALINE PHOSPHATASE 01/12/2018 99  45 - 115 U/L Final   . ALT (SGPT) 01/12/2018 12  <55 U/L Final   . AST (SGOT) 01/12/2018 26  8 - 48 U/L Final   . BILIRUBIN TOTAL 01/12/2018 1.6* 0.3 - 1.3 mg/dL Final   . BILIRUBIN DIRECT 01/12/2018 0.6* <0.3 mg/dL Final   . PROTEIN TOTAL 01/12/2018 6.4  6.0 - 8.0 g/dL Final   . SODIUM 01/12/2018 137  136 - 145 mmol/L Final   . POTASSIUM 01/12/2018 4.6  3.5 - 5.1 mmol/L Final   . CHLORIDE 01/12/2018 100  96 - 111 mmol/L Final   . CO2 TOTAL 01/12/2018 27  22 - 32 mmol/L Final   . ANION GAP 01/12/2018 10  4 - 13 mmol/L Final   . CALCIUM 01/12/2018 9.9  8.5 - 10.2 mg/dL Final   . GLUCOSE 01/12/2018 125  65 - 139 mg/dL Final   . BUN 01/12/2018 37* 8 - 25 mg/dL Final   . CREATININE 01/12/2018 2.11* 0.62 - 1.27 mg/dL Final   . BUN/CREA RATIO 01/12/2018 18  6 - 22 Final   . ESTIMATED  GFR 01/12/2018 31* >59 mL/min/1.70m2 Final   . Ventricular rate 01/12/2018 70  BPM Final   . Atrial Rate 01/12/2018 394  BPM Final   . QRS Duration 01/12/2018 126  ms Final   . QT Interval 01/12/2018 462  ms Final   . QTC Calculation 01/12/2018 498  ms Final   . Calculated R Axis 01/12/2018 -68  degrees Final   . Calculated T Axis 01/12/2018 20  degrees Final   . HEMOGLOBIN A1C 01/12/2018 8.2* 4.0 - 5.6 % Final   .  ESTIMATED AVERAGE GLUCOSE 01/12/2018 189  mg/dL Final   . MAGNESIUM 01/12/2018 2.2  1.6 - 2.6 mg/dL Final   . PHOSPHORUS 01/12/2018 1.9* 2.3 - 4.0 mg/dL Final    High doses of Liposomal Amphotericin B (AmBisome) therapy may cause falsely elevated Phosphorus results   . PROCALCITONIN SERUM 01/12/2018 <0.05  <0.50 ng/mL Final   . WBC 01/12/2018 7.2  3.7 - 11.0 x10^3/uL Final   . RBC 01/12/2018 4.55  4.50 - 6.10 x10^6/uL Final   . HGB 01/12/2018 13.9  13.4 - 17.5 g/dL Final   . HCT 01/12/2018 42.0  38.9 - 52.0 % Final   . MCV 01/12/2018 92.3  78.0 - 100.0 fL Final   . MCH 01/12/2018 30.5  26.0 - 32.0 pg Final   . MCHC 01/12/2018 33.1  31.0 - 35.5 g/dL Final   . RDW-CV 01/12/2018 14.8  11.5 - 15.5 % Final   . PLATELETS 01/12/2018 112* 150 - 400 x10^3/uL Final   . NEUTROPHIL % 01/12/2018 70  % Final   . LYMPHOCYTE % 01/12/2018 13  % Final   . MONOCYTE % 01/12/2018 13  % Final   . EOSINOPHIL % 01/12/2018 2  % Final   . BASOPHIL % 01/12/2018 0  % Final   . NEUTROPHIL # 01/12/2018 5.13  1.50 - 7.70 x10^3/uL Final   . LYMPHOCYTE # 01/12/2018 0.91* 1.00 - 4.80 x10^3/uL Final   . MONOCYTE # 01/12/2018 0.91  0.20 - 1.10 x10^3/uL Final   . EOSINOPHIL # 01/12/2018 0.12  <=0.50 x10^3/uL Final   . BASOPHIL # 01/12/2018 <0.10  <=0.20 x10^3/uL Final   . IMMATURE GRANULOCYTE % 01/12/2018 2* 0 - 1 % Final    The immature granulocyte fraction (IGF) quantifies total circulating myelocytes, metamyelocytes, and promyelocytes. It is used to evaluate immune responses to infection, inflammation, or other stimuli of the bone  marrow. Caution is advised in interpreting test results in neonates who normally have greater numbers of circulating immature blood cells.     . IMMATURE GRANULOCYTE # 01/12/2018 0.11* <0.10 x10^3/uL Final   . SODIUM 01/13/2018 137  136 - 145 mmol/L Final   . POTASSIUM 01/13/2018 4.1  3.5 - 5.1 mmol/L Final   . CHLORIDE 01/13/2018 105  96 - 111 mmol/L Final   . CO2 TOTAL 01/13/2018 24  22 - 32 mmol/L Final   . ANION GAP 01/13/2018 8  4 - 13 mmol/L Final   . CALCIUM 01/13/2018 9.6  8.5 - 10.2 mg/dL Final   . GLUCOSE 01/13/2018 76  65 - 139 mg/dL Final   . BUN 01/13/2018 33* 8 - 25 mg/dL Final   . CREATININE 01/13/2018 1.87* 0.62 - 1.27 mg/dL Final   . BUN/CREA RATIO 01/13/2018 18  6 - 22 Final   . ESTIMATED GFR 01/13/2018 35* >59 mL/min/1.36m2 Final   . GLUCOSE, POC 01/13/2018 82  70 - 105 mg/dl Final    RN Notified   . GLUCOSE, POC 01/13/2018 211* 70 - 105 mg/dl Final    RN Notified   . GLUCOSE, POC 01/13/2018 138* 70 - 105 mg/dl Final    RN Notified   . GLUCOSE, POC 01/13/2018 118* 70 - 105 mg/dl Final    RN Notified   . GLUCOSE, POC 01/13/2018 230* 70 - 105 mg/dl Final    RN Notified   . SODIUM 01/14/2018 138  136 - 145 mmol/L Final   . POTASSIUM 01/14/2018 4.2  3.5 - 5.1 mmol/L Final   . CHLORIDE 01/14/2018 104  96 - 111 mmol/L Final   . CO2 TOTAL 01/14/2018 25  22 - 32 mmol/L Final   . ANION GAP 01/14/2018 9  4 - 13 mmol/L Final   . CALCIUM 01/14/2018 9.1  8.5 - 10.2 mg/dL Final   . GLUCOSE 01/14/2018 77  65 - 139 mg/dL Final   . BUN 01/14/2018 32* 8 - 25 mg/dL Final   . CREATININE 01/14/2018 1.87* 0.62 - 1.27 mg/dL Final   . BUN/CREA RATIO 01/14/2018 17  6 - 22 Final   . ESTIMATED GFR 01/14/2018 35* >59 mL/min/1.69m2 Final   . PHOSPHORUS 01/14/2018 2.5  2.3 - 4.0 mg/dL Final    High doses of Liposomal Amphotericin B (AmBisome) therapy may cause falsely elevated Phosphorus results   . MAGNESIUM 01/14/2018 2.1  1.6 - 2.6 mg/dL Final   . WBC 01/14/2018 6.3  3.7 - 11.0 x10^3/uL Final   . RBC 01/14/2018 4.36* 4.50  - 6.10 x10^6/uL Final   . HGB 01/14/2018 13.5  13.4 - 17.5 g/dL Final   . HCT 01/14/2018 40.5  38.9 - 52.0 % Final   . MCV 01/14/2018 92.9  78.0 - 100.0 fL Final   . MCH 01/14/2018 31.0  26.0 - 32.0 pg Final   . MCHC 01/14/2018 33.3  31.0 - 35.5 g/dL Final   . RDW-CV 01/14/2018 14.9  11.5 - 15.5 % Final   . PLATELETS 01/14/2018 143* 150 - 400 x10^3/uL Final   . MPV 01/14/2018 10.8  8.7 - 12.5 fL Final   . NEUTROPHIL % 01/14/2018 66  % Final   . LYMPHOCYTE % 01/14/2018 17  % Final   . MONOCYTE % 01/14/2018 13  % Final   . EOSINOPHIL % 01/14/2018 2  % Final   . BASOPHIL % 01/14/2018 1  % Final   . NEUTROPHIL # 01/14/2018 4.23  1.50 - 7.70 x10^3/uL Final   . LYMPHOCYTE # 01/14/2018 1.06  1.00 - 4.80 x10^3/uL Final   . MONOCYTE # 01/14/2018 0.82  0.20 - 1.10 x10^3/uL Final   . EOSINOPHIL # 01/14/2018 0.13  <=0.50 x10^3/uL Final   . BASOPHIL # 01/14/2018 <0.10  <=0.20 x10^3/uL Final   . IMMATURE GRANULOCYTE % 01/14/2018 1  0 - 1 % Final    The immature granulocyte fraction (IGF) quantifies total circulating myelocytes, metamyelocytes, and promyelocytes. It is used to evaluate immune responses to infection, inflammation, or other stimuli of the bone marrow. Caution is advised in interpreting test results in neonates who normally have greater numbers of circulating immature blood cells.     . IMMATURE GRANULOCYTE # 01/14/2018 <0.10  <0.10 x10^3/uL Final   . GLUCOSE, POC 01/14/2018 71  70 - 105 mg/dl Final    RN Notified   . GLUCOSE, POC 01/14/2018 93  70 - 105 mg/dl Final    RN Notified   . GLUCOSE, POC 01/14/2018 88  70 - 105 mg/dl Final    RN Notified   . GLUCOSE, POC 01/14/2018 189* 70 - 105 mg/dl Final    RN Notified   . GLUCOSE, POC 01/14/2018 199* 70 - 105 mg/dl Final    RN Notified   . GLUCOSE, POC 01/15/2018 65* 70 - 105 mg/dl Final    RN Notified   . GLUCOSE, POC 01/15/2018 98  70 - 105 mg/dl Final   . GLUCOSE, POC 01/15/2018 164* 70 - 105 mg/dl Final    RN Notified   Documentation Only on 12/17/2017   Component  Date Value Ref Range Status   .  SODIUM 12/15/2017 135   Final   . POTASSIUM 12/15/2017 4.5   Final   . CHLORIDE 12/15/2017 94   Final   . CARBON DIOXIDE 12/15/2017 28   Final   . BUN 12/15/2017 <2   Final   . CREATININE 12/15/2017 2.5   Final   . ESTIMATED GLOMERULAR FILTRATION RA* 12/15/2017 25   Final   . HEMOGLOBIN A1C 12/15/2017 8.8   Final   Documentation Only on 12/03/2017   Component Date Value Ref Range Status   . HGB 11/17/2017 17.0   Final   . HCT 11/17/2017 51.4   Final   . SODIUM 11/17/2017 131   Final   . POTASSIUM 11/17/2017 4.3   Final   . CHLORIDE 11/17/2017 99   Final   . CARBON DIOXIDE 11/17/2017 17   Final   . BUN 11/17/2017 54   Final   . CREATININE 11/17/2017 2.5   Final   . BUN/CREAT RATIO 11/17/2017 21.60   Final   . ESTIMATED GLOMERULAR FILTRATION RA* 11/17/2017 25   Final   . GLUCOSE, FASTING 11/17/2017 381   Final   . CALCIUM 11/17/2017 8.8   Final   . TOTAL PROTEIN 11/17/2017 5.5   Final   . ALBUMIN (SERUM) 11/17/2017 3.2   Final   . PHOSPHORUS 11/17/2017 4.7   Final   . MICROALBUMIN RANDOM URINE 11/17/2017 130.6   Final   . CREATININE, UR RAND 11/17/2017 55.3   Final   . INTACT PTH 11/17/2017 219.2   Final   . 25-HYDROXY VITAMIN D 11/17/2017 56.5   Final   . IRON 11/17/2017 82   Final   . IRON BINDING CAPACITY 11/17/2017 358   Final   . IRON SATURATION 11/17/2017 23   Final   . PROTEIN, URINE, RANDOM 11/17/2017 36.7   Final   . MAGNESIUM 11/17/2017 2.2   Final   . AST (SGOT) 11/17/2017 28   Final   . ALT (SGPT) 11/17/2017 26   Final   . ALKALINE PHOSPHATASE 11/17/2017 139   Final   . URINALYSIS MISC TEST 11/17/2017    Final    glucose 500, protein 30, blood- small, mucous-few,          ASSESSMENT AND PLAN:  1. 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  Creatinines have been fluctuating between 1.9 to 2.6; however, most recent creatinine is  3.3.  He is now under the care of hospice and does not plan to pursue dialysis.  Would continue to avoid NSAIAs and other nephrotoxic drugs as much as possible .  Renal dose medications to current eGFR.  Continue blood and urine at the discretion of the PCP since he is under hospice services.  2. Electrolytes.  Hyponatremia and hypokalemia.  Check magnesium level with next labs if you plan to continue monitoring labs.  We are going to increase his diuretics today so would monitor potassium and he may need oral potassium replacement.  3. Volume status.  He is extremely fluid overloaded.  He has had a 50 pounds weight gain since his last visit to this office in May.  At this point, I believe the goal is comfort measures, so I would attempt to make him more comfortable with fluid removal. Creatinine may worsen but need to get him closer to euvolemia. Restart bumetanide 2 mg p.o. b.i.d. and continue daily weights. Monitor serum Mg+ and  K+.  Goal would be for 1 to 2 pounds weight loss daily and continue until he is close to his baseline weight of around 200 pounds.  Continue fluid restriction of 1600 mL daily and low-sodium diet.  4. Hypertension.  Blood pressure well controlled.  5. Diabetes mellitus type 2, uncontrolled.  Last available hemoglobin A1c from July was 8.2.  He has proteinuria. Given his hospice status would not be too aggressive with lowering glucose.   6. Anemia and iron status.  No current abnormalities.  7. Secondary hyperparathyroidism.  Increase calcitriol 0.25 mcg to once every day.  Calcium, phosphorus, and vitamin D levels are at target.  8. Metabolic acidosis.  Improved on sodium bicarbonate.  Can continue at same dose.  9. Disposition.  Return at the discretion of PCP.     The patient was seen independently.        Angelena Form APRN, FNP-BC              CC:   [facility]   [place], [place] [address]     Leda Gauze, MD   Fax: [phone]       DD:   03/27/2018 00:34:26  DT:  03/29/2018 07:29:36 TP  D#:  [id number]

## 2018-04-07 ENCOUNTER — Ambulatory Visit (HOSPITAL_BASED_OUTPATIENT_CLINIC_OR_DEPARTMENT_OTHER): Payer: Self-pay | Admitting: Nurse Practitioner

## 2018-04-07 NOTE — Telephone Encounter (Signed)
Routing message to [facility] [employer]. Ennis Forts, RN  04/07/2018, 12:30

## 2018-04-07 NOTE — Telephone Encounter (Signed)
Regarding: returning call   ----- Message from Flora Lipps sent at 04/07/2018 11:45 AM EDT -----  Lieutenant Diego, APRN,FNP-BC     [facility] returning your call. Please call to advise.

## 2018-06-01 ENCOUNTER — Ambulatory Visit (INDEPENDENT_AMBULATORY_CARE_PROVIDER_SITE_OTHER): Payer: Medicare Other | Admitting: Nurse Practitioner

## 2018-06-01 VITALS — BP 128/80 | HR 70 | Resp 15 | Ht 72.0 in | Wt 201.2 lb

## 2018-06-01 DIAGNOSIS — I10 Essential (primary) hypertension: Secondary | ICD-10-CM

## 2018-06-01 DIAGNOSIS — N184 Chronic kidney disease, stage 4 (severe): Secondary | ICD-10-CM

## 2018-06-01 DIAGNOSIS — E118 Type 2 diabetes mellitus with unspecified complications: Secondary | ICD-10-CM

## 2018-06-01 LAB — ENTER/EDIT NEPHROLOGY EXTERNAL LABS
HCT: 41.5
HCT: 41.5
HGB: 14
PLATELET COUNT: 299
WBC: 7.9

## 2018-06-02 ENCOUNTER — Encounter (INDEPENDENT_AMBULATORY_CARE_PROVIDER_SITE_OTHER): Payer: Self-pay | Admitting: Nurse Practitioner

## 2018-06-02 ENCOUNTER — Encounter (INDEPENDENT_AMBULATORY_CARE_PROVIDER_SITE_OTHER): Payer: Self-pay | Admitting: Internal Medicine

## 2018-06-02 NOTE — Progress Notes (Signed)
PATIENT NAME: [name], DOCKEN  HOSPITAL NUMBER:  [id number]  DATE OF SERVICE: 06/01/2018  DATE OF BIRTH:      CLINIC NOTE    IDENTIFICATION:  [name] is a 77 year old gentleman, a resident of [address] and [address] in [place], [place].    BACKGROUND:  The patient has a significant cardiac history of CAD status post PCI, ischemic cardiomyopathy with AICD implantation, systolic and diastolic heart failure, atrial fibrillation, hypertension.  He also has DM 2, BPH and a remote history of kidney stones.  He has severe multivessel CAD, but was deemed a poor candidate for intervention.  He has had numerous hospitalizations for decompensated heart failure.  Creatinine has fluctuated between 1.9 to 2.6 and had increased to 3.3 on his last labs.  In July, he was admitted to [facility] and discharged on hospice services.  He has, however, wished to continue following up in the kidney clinic.    SUBJECTIVE:  The patient reports feeling better some days than others.  He has very little energy.  He is unable to walk more than a couple steps.  He denies chest pain, palpitations, shortness of breath or swelling.  Information from records from [facility] indicate he has had about a 40 pound weight loss since his last visit.  He reports he is urinating frequently and making a lot of urine.  He has nocturia.  He denies dysuria, hematuria, or flank pain.  His appetite is poor.  He denies nausea, vomiting, dysgeusia or diarrhea.  He is on fluid and sodium restriction.  He does not know what his blood glucose levels have been recently.  He does report his blood pressure has been good, sometimes a little low. He is not experiencing pruritus.    Allergies   Allergen Reactions   . Lipitor [Atorvastatin]      Concerned about cognitive function.   . Pravachol [Pravastatin] Rash   . Amiodarone      Current Outpatient Medications   Medication Sig   . apixaban (ELIQUIS) 5 mg Oral Tablet Take 1 Tab (5 mg  total) by mouth Twice daily   . bumetanide (BUMEX) 0.5 mg Oral Tablet Take 0.5 mg by mouth Once a day   . calcitriol (ROCALTROL) 0.25 mcg Oral Capsule Take 0.25 mcg by mouth Once a day On Monday/Wednesday/Friday   . carvedilol (COREG) 3.125 mg Oral Tablet Take 1 Tab (3.125 mg total) by mouth Twice daily with food   . cholecalciferol, vitamin D3, 1,000 unit Oral Tablet Take 1,000 Units by mouth Once a day   . coenzyme Q10 (CO Q-10) 100 mg Oral Capsule Take 100 mg by mouth Every evening with dinner   . donepezil (ARICEPT) 10 mg Oral Tablet Take 10 mg by mouth Every night   . Ferrous Sulfate 142 mg (45 mg iron) Oral Tablet Sustained Release Take by mouth   . folic acid (FOLVITE) 1 mg Oral Tablet Take 1 mg by mouth Once a day   . hydrALAZINE (APRESOLINE) 10 mg Oral Tablet Take 1 Tab (10 mg total) by mouth Every 8 hours   . insulin glargine (LANTUS) 100 unit/mL Subcutaneous injection (vial) 30 Units by Subcutaneous route Once a day    . insulin R human (HUMULIN R) 100 units/mL Subcutaneous Injectable 5 Units by Subcutaneous route Four times a day as needed    . isosorbide mononitrate (IMDUR) 30 mg Oral Tablet Sustained Release 24 hr Take 1 Tab (30 mg total) by mouth Once a  day   . morphine 10 mg/5 mL Oral Solution Take 5 mg by mouth Every 2 hours as needed   . nitroGLYCERIN (NITROSTAT) 0.4 mg Sublingual Tablet, Sublingual 0.4 mg by Sublingual route Every 5 minutes as needed for Chest pain for 3 doses over 15 minutes   . omega-3-DHA-EPA-fish oil (FISH OIL) 1,000 mg (120 mg-180 mg) Oral Capsule Take by mouth Once a day   . raNITIdine (ZANTAC) 150 mg Oral Tablet Take 150 mg by mouth Once a day    . sennosides-docusate sodium (SENOKOT-S) 8.6-50 mg Oral Tablet Take 1 Tab by mouth Twice daily   . sodium bicarbonate 650 mg Oral Tablet Take 650 mg by mouth Twice daily   . torsemide (DEMADEX) 20 mg Oral Tablet Take 1 Tab (20 mg total) by mouth Once per day as needed (Only if gains 3 pounds in one day, or 5 pounds over 48 hours)            OBJECTIVE:  Vital signs:  Blood pressure 128/80, pulse 70, respirations 15, height 6 feet, weight 91.3 kg, BMI 27.29 kg/m2.    General:  Alert, chronically ill-appearing 77 year old male, who arrived via wheelchair.  He is in no apparent distress.    Head:  Normocephalic, atraumatic.    Eyes:  No scleral icterus.    Mouth:  Mucous membranes are moist, no fetor.    Heart: Regular rate and rhythm.    Lungs:  Clear to auscultation, no wheezes or rales.  Normal respiratory effort.    Abdomen:  Soft, nondistended.  Bowel sounds are present.    Extremities:  He has compression wraps to his lower legs.  No edema in his upper thighs.    Skin:  Warm and dry, no pallor or jaundice. He has an open wound to the middle of his back, just left of the spine. Area is very painful with even slight touch. Wound is dry, no drainage, mild surrounding erythema. He also has a dressing to his left shoulder.   Neuro:  Alert, oriented to person, place, time, and situation.  Moving all extremities.  No gross focal deficit.    Psych: Pleasant and appropriate.     Office Visit on 06/01/2018   Component Date Value Ref Range Status   . WBC 04/21/2018 7.9   Final   . HGB 04/21/2018 14.0   Final   . HCT 04/21/2018 41.5   Final   . PLATELET COUNT 04/21/2018 299   Final   Office Visit on 03/24/2018   Component Date Value Ref Range Status   . SODIUM 02/19/2018 142   Final   . POTASSIUM 02/19/2018 4.5   Final   . CHLORIDE 02/19/2018 105   Final   . CARBON DIOXIDE 02/19/2018 26   Final   . BUN 02/19/2018 26   Final   . CREATININE 02/19/2018 1.9   Final   . BUN/CREAT RATIO 02/19/2018 13.68   Final   . ESTIMATED GLOMERULAR FILTRATION RA* 02/19/2018 35   Final   . GLUCOSE, FASTING 02/19/2018 102   Final   . CALCIUM 02/19/2018 9.1   Final   . TOTAL PROTEIN 02/19/2018 5.6   Final   . ALBUMIN (SERUM) 02/19/2018 3.4   Final   . B-TYPE NATRIURETIC PEPTIDE 02/19/2018 991.0   Final   . SODIUM 02/28/2018 131   Final   . POTASSIUM 02/28/2018 3.4   Final   .  CHLORIDE 02/28/2018 85   Final   . CARBON DIOXIDE 02/28/2018  28   Final   . BUN 02/28/2018 78   Final   . CREATININE 02/28/2018 3.3   Final   . BUN/CREAT RATIO 02/28/2018 23.64   Final   . ESTIMATED GLOMERULAR FILTRATION RA* 02/28/2018 18   Final   . GLUCOSE, FASTING 02/28/2018 354   Final   Admission on 01/12/2018, Discharged on 01/15/2018   Component Date Value Ref Range Status   . BNP 01/12/2018 1,642* <=100 pg/mL Final    Typical clinically relevant cutoffs for BNP are:                         <100 pg/mL - Heart failure unlikely in most patients.  100-400 pg/mL - Heart failure is possible, clinical interpretation required.  >400 pg/mL - Heart failure is likely in appropriate clinical setting.                                                                                                                                                   BNP should not be monitored in patients requiring intensive dialysis.  Age and sex specific cutoffs are not recommended in recent guidelines.  Cutoffs may need adjusted for obese patients. Reference: Mant et al. Dewayne Hatch Int  Med [phone]   . PROTHROMBIN TIME 01/12/2018 19.5* 9.5 - 14.1 seconds Final   . INR 01/12/2018 1.68* 0.80 - 1.20 Final   . ALBUMIN 01/12/2018 4.1  3.4 - 4.8 g/dL Final   . ALKALINE PHOSPHATASE 01/12/2018 99  45 - 115 U/L Final   . ALT (SGPT) 01/12/2018 12  <55 U/L Final   . AST (SGOT) 01/12/2018 26  8 - 48 U/L Final   . BILIRUBIN TOTAL 01/12/2018 1.6* 0.3 - 1.3 mg/dL Final   . BILIRUBIN DIRECT 01/12/2018 0.6* <0.3 mg/dL Final   . PROTEIN TOTAL 01/12/2018 6.4  6.0 - 8.0 g/dL Final   . SODIUM 98/05/9146 137  136 - 145 mmol/L Final   . POTASSIUM 01/12/2018 4.6  3.5 - 5.1 mmol/L Final   . CHLORIDE 01/12/2018 100  96 - 111 mmol/L Final   . CO2 TOTAL 01/12/2018 27  22 - 32 mmol/L Final   . ANION GAP 01/12/2018 10  4 - 13 mmol/L Final   . CALCIUM 01/12/2018 9.9  8.5 - 10.2 mg/dL Final   . GLUCOSE 82/95/6213 125  65 - 139 mg/dL Final   . BUN 08/65/7846 37* 8 - 25  mg/dL Final   . CREATININE 96/29/5284 2.11* 0.62 - 1.27 mg/dL Final   . BUN/CREA RATIO 01/12/2018 18  6 - 22 Final   . ESTIMATED GFR 01/12/2018 31* >59 mL/min/1.70m^2 Final   . Ventricular rate 01/12/2018 70  BPM Final   . Atrial Rate 01/12/2018 394  BPM Final   . QRS Duration 01/12/2018 126  ms Final   .  QT Interval 01/12/2018 462  ms Final   . QTC Calculation 01/12/2018 498  ms Final   . Calculated R Axis 01/12/2018 -68  degrees Final   . Calculated T Axis 01/12/2018 20  degrees Final   . HEMOGLOBIN A1C 01/12/2018 8.2* 4.0 - 5.6 % Final   . ESTIMATED AVERAGE GLUCOSE 01/12/2018 189  mg/dL Final   . MAGNESIUM 16/04/9603 2.2  1.6 - 2.6 mg/dL Final   . PHOSPHORUS 54/03/8118 1.9* 2.3 - 4.0 mg/dL Final    High doses of Liposomal Amphotericin B (AmBisome) therapy may cause falsely elevated Phosphorus results   . PROCALCITONIN SERUM 01/12/2018 <0.05  <0.50 ng/mL Final   . WBC 01/12/2018 7.2  3.7 - 11.0 x10^3/uL Final   . RBC 01/12/2018 4.55  4.50 - 6.10 x10^6/uL Final   . HGB 01/12/2018 13.9  13.4 - 17.5 g/dL Final   . HCT 14/78/2956 42.0  38.9 - 52.0 % Final   . MCV 01/12/2018 92.3  78.0 - 100.0 fL Final   . MCH 01/12/2018 30.5  26.0 - 32.0 pg Final   . MCHC 01/12/2018 33.1  31.0 - 35.5 g/dL Final   . RDW-CV 21/30/8657 14.8  11.5 - 15.5 % Final   . PLATELETS 01/12/2018 112* 150 - 400 x10^3/uL Final   . NEUTROPHIL % 01/12/2018 70  % Final   . LYMPHOCYTE % 01/12/2018 13  % Final   . MONOCYTE % 01/12/2018 13  % Final   . EOSINOPHIL % 01/12/2018 2  % Final   . BASOPHIL % 01/12/2018 0  % Final   . NEUTROPHIL # 01/12/2018 5.13  1.50 - 7.70 x10^3/uL Final   . LYMPHOCYTE # 01/12/2018 0.91* 1.00 - 4.80 x10^3/uL Final   . MONOCYTE # 01/12/2018 0.91  0.20 - 1.10 x10^3/uL Final   . EOSINOPHIL # 01/12/2018 0.12  <=0.50 x10^3/uL Final   . BASOPHIL # 01/12/2018 <0.10  <=0.20 x10^3/uL Final   . IMMATURE GRANULOCYTE % 01/12/2018 2* 0 - 1 % Final    The immature granulocyte fraction (IGF) quantifies total circulating myelocytes,  metamyelocytes, and promyelocytes. It is used to evaluate immune responses to infection, inflammation, or other stimuli of the bone marrow. Caution is advised in interpreting test results in neonates who normally have greater numbers of circulating immature blood cells.     . IMMATURE GRANULOCYTE # 01/12/2018 0.11* <0.10 x10^3/uL Final   . SODIUM 01/13/2018 137  136 - 145 mmol/L Final   . POTASSIUM 01/13/2018 4.1  3.5 - 5.1 mmol/L Final   . CHLORIDE 01/13/2018 105  96 - 111 mmol/L Final   . CO2 TOTAL 01/13/2018 24  22 - 32 mmol/L Final   . ANION GAP 01/13/2018 8  4 - 13 mmol/L Final   . CALCIUM 01/13/2018 9.6  8.5 - 10.2 mg/dL Final   . GLUCOSE 84/69/6295 76  65 - 139 mg/dL Final   . BUN 28/41/3244 33* 8 - 25 mg/dL Final   . CREATININE 07/16/7251 1.87* 0.62 - 1.27 mg/dL Final   . BUN/CREA RATIO 01/13/2018 18  6 - 22 Final   . ESTIMATED GFR 01/13/2018 35* >59 mL/min/1.70m^2 Final   . GLUCOSE, POC 01/13/2018 82  70 - 105 mg/dl Final    RN Notified   . GLUCOSE, POC 01/13/2018 211* 70 - 105 mg/dl Final    RN Notified   . GLUCOSE, POC 01/13/2018 138* 70 - 105 mg/dl Final    RN Notified   . GLUCOSE, POC 01/13/2018 118* 70 - 105  mg/dl Final    RN Notified   . GLUCOSE, POC 01/13/2018 230* 70 - 105 mg/dl Final    RN Notified   . SODIUM 01/14/2018 138  136 - 145 mmol/L Final   . POTASSIUM 01/14/2018 4.2  3.5 - 5.1 mmol/L Final   . CHLORIDE 01/14/2018 104  96 - 111 mmol/L Final   . CO2 TOTAL 01/14/2018 25  22 - 32 mmol/L Final   . ANION GAP 01/14/2018 9  4 - 13 mmol/L Final   . CALCIUM 01/14/2018 9.1  8.5 - 10.2 mg/dL Final   . GLUCOSE 47/82/9562 77  65 - 139 mg/dL Final   . BUN 13/02/6577 32* 8 - 25 mg/dL Final   . CREATININE 46/96/2952 1.87* 0.62 - 1.27 mg/dL Final   . BUN/CREA RATIO 01/14/2018 17  6 - 22 Final   . ESTIMATED GFR 01/14/2018 35* >59 mL/min/1.6m^2 Final   . PHOSPHORUS 01/14/2018 2.5  2.3 - 4.0 mg/dL Final    High doses of Liposomal Amphotericin B (AmBisome) therapy may cause falsely elevated Phosphorus results    . MAGNESIUM 01/14/2018 2.1  1.6 - 2.6 mg/dL Final   . WBC 84/13/2440 6.3  3.7 - 11.0 x10^3/uL Final   . RBC 01/14/2018 4.36* 4.50 - 6.10 x10^6/uL Final   . HGB 01/14/2018 13.5  13.4 - 17.5 g/dL Final   . HCT 05/10/2535 40.5  38.9 - 52.0 % Final   . MCV 01/14/2018 92.9  78.0 - 100.0 fL Final   . MCH 01/14/2018 31.0  26.0 - 32.0 pg Final   . MCHC 01/14/2018 33.3  31.0 - 35.5 g/dL Final   . RDW-CV 64/40/3474 14.9  11.5 - 15.5 % Final   . PLATELETS 01/14/2018 143* 150 - 400 x10^3/uL Final   . MPV 01/14/2018 10.8  8.7 - 12.5 fL Final   . NEUTROPHIL % 01/14/2018 66  % Final   . LYMPHOCYTE % 01/14/2018 17  % Final   . MONOCYTE % 01/14/2018 13  % Final   . EOSINOPHIL % 01/14/2018 2  % Final   . BASOPHIL % 01/14/2018 1  % Final   . NEUTROPHIL # 01/14/2018 4.23  1.50 - 7.70 x10^3/uL Final   . LYMPHOCYTE # 01/14/2018 1.06  1.00 - 4.80 x10^3/uL Final   . MONOCYTE # 01/14/2018 0.82  0.20 - 1.10 x10^3/uL Final   . EOSINOPHIL # 01/14/2018 0.13  <=0.50 x10^3/uL Final   . BASOPHIL # 01/14/2018 <0.10  <=0.20 x10^3/uL Final   . IMMATURE GRANULOCYTE % 01/14/2018 1  0 - 1 % Final    The immature granulocyte fraction (IGF) quantifies total circulating myelocytes, metamyelocytes, and promyelocytes. It is used to evaluate immune responses to infection, inflammation, or other stimuli of the bone marrow. Caution is advised in interpreting test results in neonates who normally have greater numbers of circulating immature blood cells.     . IMMATURE GRANULOCYTE # 01/14/2018 <0.10  <0.10 x10^3/uL Final   . GLUCOSE, POC 01/14/2018 71  70 - 105 mg/dl Final    RN Notified   . GLUCOSE, POC 01/14/2018 93  70 - 105 mg/dl Final    RN Notified   . GLUCOSE, POC 01/14/2018 88  70 - 105 mg/dl Final    RN Notified   . GLUCOSE, POC 01/14/2018 189* 70 - 105 mg/dl Final    RN Notified   . GLUCOSE, POC 01/14/2018 199* 70 - 105 mg/dl Final    RN Notified   . GLUCOSE, POC 01/15/2018 65* 70 -  105 mg/dl Final    RN Notified   . GLUCOSE, POC 01/15/2018 98  70 -  105 mg/dl Final   . GLUCOSE, POC 01/15/2018 164* 70 - 105 mg/dl Final    RN Notified   Documentation Only on 12/17/2017   Component Date Value Ref Range Status   . SODIUM 12/15/2017 135   Final   . POTASSIUM 12/15/2017 4.5   Final   . CHLORIDE 12/15/2017 94   Final   . CARBON DIOXIDE 12/15/2017 28   Final   . BUN 12/15/2017 <2   Final   . CREATININE 12/15/2017 2.5   Final   . ESTIMATED GLOMERULAR FILTRATION RA* 12/15/2017 25   Final   . HEMOGLOBIN A1C 12/15/2017 8.8   Final   Documentation Only on 12/03/2017   Component Date Value Ref Range Status   . HGB 11/17/2017 17.0   Final   . HCT 11/17/2017 51.4   Final   . SODIUM 11/17/2017 131   Final   . POTASSIUM 11/17/2017 4.3   Final   . CHLORIDE 11/17/2017 99   Final   . CARBON DIOXIDE 11/17/2017 17   Final   . BUN 11/17/2017 54   Final   . CREATININE 11/17/2017 2.5   Final   . BUN/CREAT RATIO 11/17/2017 21.60   Final   . ESTIMATED GLOMERULAR FILTRATION RA* 11/17/2017 25   Final   . GLUCOSE, FASTING 11/17/2017 381   Final   . CALCIUM 11/17/2017 8.8   Final   . TOTAL PROTEIN 11/17/2017 5.5   Final   . ALBUMIN (SERUM) 11/17/2017 3.2   Final   . PHOSPHORUS 11/17/2017 4.7   Final   . MICROALBUMIN RANDOM URINE 11/17/2017 130.6   Final   . CREATININE, UR RAND 11/17/2017 55.3   Final   . INTACT PTH 11/17/2017 219.2   Final   . 25-HYDROXY VITAMIN D 11/17/2017 56.5   Final   . IRON 11/17/2017 82   Final   . IRON BINDING CAPACITY 11/17/2017 358   Final   . IRON SATURATION 11/17/2017 23   Final   . PROTEIN, URINE, RANDOM 11/17/2017 36.7   Final   . MAGNESIUM 11/17/2017 2.2   Final   . AST (SGOT) 11/17/2017 28   Final   . ALT (SGPT) 11/17/2017 26   Final   . ALKALINE PHOSPHATASE 11/17/2017 139   Final   . URINALYSIS MISC TEST 11/17/2017    Final    glucose 500, protein 30, blood- small, mucous-few,          ASSESSMENT AND PLAN:  1. 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  He has not had any repeat labs since his last  visit in September. At that time, his creatinine was 3.3.  He does not desire to pursue dialysis.  He is under the care of hospice.  He does, however, wish to continue follow up in the kidney clinic and would like labs to monitor renal function.  2. Electrolytes.  He has not had any recent labs . He had mild hyponatremia and hypokalemia on last available labs. Would repeat basic metabolic panel.  Concern for worsening hypokalemia and hyponatremia due to daily diuretics.  3. Volume status.  Significantly improved.  He has had a 40 pound weight loss since his last visit, on Bumex daily.  Would continue Bumex at same dose as long as he is losing weight.  4. Hypertension, controlled.  Continue current medications.  5. Diabetes mellitus type 2 with hyperglycemia. Last available hemoglobin A1c 8.2 in July.  He has proteinuria.  6. Anemia and iron status.  Current hemoglobin 14 and stable on ferrous sulfate.   7. 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 No current iPTH, calcium, phosphorus or vitamin D levels available for review. Check levels with next lab collection.   8. Metabolic acidosis. Improved on sodium bicarb.  9. Disposition.  Return for follow up in 4 months if at that time, patient desires.     The patient was seen independently.        Montine Circle APRN, FNP-BC              CC:   [facility]   [address]   [place], [place] [address]     Sherene Sires, MD   Fax: [phone]       DD:  06/02/2018 00:11:21  DT:  06/02/2018 14:57:00 LS  D#:  [id number]

## 2018-06-16 NOTE — Progress Notes (Signed)
[facility]  [address]  [place] [address]  Phone: [phone]  Fax: [phone]    Encounter Date: 06/17/2018    Patient ID:  [name]  RJJ:[id number]    DOB:   Age: 77 y.o. male    Subjective:     Chief Complaint   Patient presents with   . FOLLOWUP INDIVIDUAL THERAPY-MED CHECK   . No Complaints     HPI   [name] is a pleasant 77 y.o. male presenting for follow up. 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 He was transferred to [facility] for further management of his heart failure and was diuresed with IV Furosemide and Metolazone. He was determined to be a poor candidate for high risk PCI given his comorbidities.A transthoracic echocardiogramthatwas completed on January 13, 2018 showed global left ventricular hypokinesis with an EF of 35% and mild mitral regurgitation. In regards to his atrial fibrillation,Digoxin was discontinued secondary to his chronic kidney disease with a baseline serum creatinine around 2.5. He remains off of Amiodaronedue to concerns for Amiodarone toxicity.He remains on chronic anticoagulation with Eliquis 5 mg twice daily for thromboembolic prophylaxis. After another hospitalization in July of 2019 for heart failure, he and his family decided that it would be best if he  were discharged home on hospice as they did not want him to have repeated hospital admissions. He continues to follow routinely with Nephrology for his CKD. Today, he reports that he is doing very well and voices no complaints. He notes that the edema in his lower extremities has improved significantly. He denies chest pain/pressure/tightness, palpitations, weight gain, dyspnea on exertion, n/v, abdominal pain, bleeding complications, pre-syncope and syncope. No TIA or stroke-like symptoms. He states that he is scheduled to have skin cancer excised from his back on June 29, 2018 with local anesthesia and conscious sedation.      Current Outpatient Medications   Medication Sig   . apixaban (ELIQUIS) 5 mg Oral Tablet Take 1 Tab (5 mg total) by mouth Twice daily   . aspirin (ECOTRIN LOW STRENGTH ORAL) Take 81 mg by mouth   . bumetanide (BUMEX) 0.5 mg Oral Tablet Take 0.5 mg by mouth Once a day   . calcitriol (ROCALTROL) 0.25 mcg Oral Capsule Take 0.25 mcg by mouth Once a day On Monday/Wednesday/Friday   . carvedilol (COREG) 3.125 mg Oral Tablet Take 1 Tab (3.125 mg total) by mouth Twice daily with food   . cholecalciferol, vitamin D3, 1,000 unit Oral Tablet Take 1,000 Units by mouth Once a day   . coenzyme Q10 (CO Q-10) 100 mg Oral Capsule Take 100 mg by mouth Every evening with dinner   . donepezil (ARICEPT) 10 mg Oral Tablet Take 10 mg by mouth Every night   . famotidine (PEPCID) 20 mg Oral Tablet Take 20 mg by mouth Twice daily   . Ferrous  Sulfate 142 mg (45 mg iron) Oral Tablet Sustained Release Take by mouth   . folic acid (FOLVITE) 1 mg Oral Tablet Take 1 mg by mouth Once a day   . hydrALAZINE (APRESOLINE) 10 mg Oral Tablet Take 1 Tab (10 mg total) by mouth Every 8 hours   . insulin glargine (LANTUS) 100 unit/mL Subcutaneous injection (vial) 30 Units by Subcutaneous route Once a day    . insulin R human (HUMULIN R) 100 units/mL Subcutaneous Injectable 5 Units by Subcutaneous route Four times a day as needed      . isosorbide mononitrate (IMDUR) 30 mg Oral Tablet Sustained Release 24 hr Take 1 Tab (30 mg total) by mouth Once a day   . Morphine 20 mg/5 mL (4 mg/mL) Oral Solution Take 15 mg by mouth Every 2 hours as needed    . nitroGLYCERIN (NITROSTAT) 0.4 mg Sublingual Tablet, Sublingual 0.4 mg by Sublingual route Every 5 minutes as needed for Chest pain for 3 doses over 15 minutes   . omega-3-DHA-EPA-fish oil (FISH OIL) 1,000 mg (120 mg-180 mg) Oral Capsule Take by mouth Once a day   . raNITIdine (ZANTAC) 150 mg Oral Tablet Take 150 mg by mouth Once a day    . sennosides-docusate sodium (SENOKOT-S) 8.6-50 mg Oral Tablet Take 1 Tab by mouth Twice daily   . sodium bicarbonate 650 mg Oral Tablet Take 650 mg by mouth Twice daily   . torsemide (DEMADEX) 20 mg Oral Tablet Take 1 Tab (20 mg total) by mouth Once per day as needed (Only if gains 3 pounds in one day, or 5 pounds over 48 hours)     Allergies   Allergen Reactions   . Lipitor [Atorvastatin]      Concerned about cognitive function.   . Pravachol [Pravastatin] Rash   . Amiodarone      Past Medical History:   Diagnosis Date   . Automatic implantable cardioverter-defibrillator in situ    . BPH (benign prostatic hypertrophy)    . CAD (coronary artery disease) 09/25/2011   . Cataract    . CHF (NYHA class II, ACC/AHA stage C) (CMS HCC) 10/24/2013   . CVA (cerebrovascular accident) (CMS [place])    . Dementia (CMS [place])    . Diabetes mellitus (CMS [place])    . Diabetes mellitus, type 2 (CMS HCC)    . Disorder of prostate    . Esophageal reflux    . HTN (hypertension)    . MI (myocardial infarction) (CMS [place])    . Pacemaker    . Renal stones    . Stented coronary artery    . Type 2 diabetes mellitus (CMS HCC)    . Wears dentures    . Wears glasses          Past Surgical History:   Procedure Laterality Date   . CORONARY ARTERY ANGIOPLASTY     . HX ADENOIDECTOMY     . HX APPENDECTOMY     . HX CARDIAC ABLATION     . HX HEART CATHETERIZATION     . HX LITHOTRIPSY     . HX PACEMAKER  DEFIBRILLATOR PLACEMENT     . HX TONSILLECTOMY           Family Medical History:     Problem Relation (Age of Onset)    Cancer Sister    Coronary Artery Disease Other    Diabetes Mother, Sister    High Cholesterol Father    Hypertension (High Blood Pressure) Father  Social History     Tobacco Use   . Smoking status: Never Smoker   . Smokeless tobacco: Never Used   Substance Use Topics   . Alcohol use: No   . Drug use: No       Review of Systems   Constitutional: Negative for activity change, appetite change, chills, diaphoresis, fatigue, fever and unexpected weight change.   HENT: Negative for hearing loss.    Respiratory: Negative for apnea, cough, chest tightness, shortness of breath and wheezing.    Cardiovascular: Positive for leg swelling (improved). Negative for chest pain and palpitations.   Gastrointestinal: Negative for abdominal pain and blood in stool.   Genitourinary: Negative for hematuria.   Musculoskeletal: Positive for arthralgias.   Skin: Negative for color change and rash.   Neurological: Negative for dizziness, syncope, weakness and light-headedness.   Hematological: Does not bruise/bleed easily.   All other systems reviewed and are negative.    Objective:   Vitals: BP 114/75   Pulse 70   Resp 18   Ht 1.829 m (6')   Wt 91.4 kg (201 lb 6.4 oz)   SpO2 97%   BMI 27.31 kg/m         Physical Exam  Vitals signs and nursing note reviewed.   Constitutional:       General: He is not in acute distress.     Appearance: He is well-developed. He is not diaphoretic.   HENT:      Head: Normocephalic and atraumatic.   Eyes:      General: No scleral icterus.  Neck:      Musculoskeletal: Neck supple.      Vascular: No JVD.   Cardiovascular:      Rate and Rhythm: Normal rate and regular rhythm.      Heart sounds: No murmur. No friction rub. No gallop.    Pulmonary:      Effort: Pulmonary effort is normal. No respiratory distress.      Breath sounds: Normal breath sounds. No wheezing or rales.      Abdominal:      General: Bowel sounds are normal.      Palpations: Abdomen is soft.   Musculoskeletal:         General: No tenderness.      Right lower leg: No edema.      Left lower leg: No edema.   Skin:     General: Skin is warm and dry.      Findings: No erythema or rash.   Neurological:      Mental Status: He is alert and oriented to person, place, and time.   Psychiatric:         Behavior: Behavior normal.         Labs 06/02/2018  Sodium 133, potassium 3.5, BUN 31, creatinine 2.2, EGFR 29, glucose 246, , HGB A1c 7.8, WBC 8.9, hemoglobin 14.8, hematocrit 43.8, platelets 176    Assessment & Plan:     ENCOUNTER DIAGNOSES     ICD-10-CM   1. CAD (coronary artery disease): Stable. No anginal complaints. He remains on appropriate guideline-directed medical therapy with ASA 81 mg once daily, Coreg 3.125 mg twice daily and Imdur 30 mg once daily with Nitroglycerin as needed.  I25.10   2. Ischemic cardiomyopathy s/p ICD implantation. EF was 35% per TTE completed January 13, 2018. Appears euvolemic. Will continue Coreg, Torsemide and Bumex. We discussed the importance of limiting fluid and salt intake, and he voiced understanding. Will have his  office visit with EP rescheduled as his visit scheduled for June 02, 2018 had to be cancelled.  I25.5   3. Atrial fibrillation (CMS [facility]): He remains on Eliquis 5 mg twice daily without bleeding complications and Coreg 1.751 mg twice daily for rate control.  I48.91   4. S/P AV nodal ablation s/p BiV ICD. Remote device transmission on June 02, 2018 showed normal device function.  W25.852     Per Dr. Lyndee Leo, he may proceed forward with excision of skin cancer on his back with local anesthesia and conscious sedation. Eliquis may be held 48 hours prior to the procedure.     No orders of the defined types were placed in this encounter.      Return in about 6 months (around 12/17/2018), or if symptoms worsen or fail to improve.    This patient was seen independently with  supervising physician present and available for consultation.  Case was reviewed with Dr. Lyndee Leo.     Ashlee O'Kernick, PA-C

## 2018-06-17 ENCOUNTER — Encounter (INDEPENDENT_AMBULATORY_CARE_PROVIDER_SITE_OTHER): Payer: Self-pay | Admitting: Medical

## 2018-06-17 ENCOUNTER — Ambulatory Visit (INDEPENDENT_AMBULATORY_CARE_PROVIDER_SITE_OTHER): Payer: Medicare Other | Admitting: Medical

## 2018-06-17 VITALS — BP 114/75 | HR 70 | Resp 18 | Ht 72.0 in | Wt 201.4 lb

## 2018-06-17 DIAGNOSIS — I2510 Atherosclerotic heart disease of native coronary artery without angina pectoris: Secondary | ICD-10-CM

## 2018-06-17 DIAGNOSIS — I255 Ischemic cardiomyopathy: Secondary | ICD-10-CM

## 2018-06-17 DIAGNOSIS — I4891 Unspecified atrial fibrillation: Secondary | ICD-10-CM

## 2018-06-17 DIAGNOSIS — Z9889 Other specified postprocedural states: Secondary | ICD-10-CM

## 2018-06-17 NOTE — Nursing Note (Signed)
Meds checked with patients list and confirmed correct pharmacy with patient for e-scribing.   Joanne Gavel, LPN  61/11/1832, 10:34

## 2018-06-21 ENCOUNTER — Ambulatory Visit: Payer: Medicare Other | Attending: Internal Medicine

## 2018-06-21 DIAGNOSIS — Z4502 Encounter for adjustment and management of automatic implantable cardiac defibrillator: Secondary | ICD-10-CM | POA: Insufficient documentation

## 2018-06-21 DIAGNOSIS — I4891 Unspecified atrial fibrillation: Secondary | ICD-10-CM | POA: Insufficient documentation

## 2018-06-21 DIAGNOSIS — I447 Left bundle-branch block, unspecified: Secondary | ICD-10-CM

## 2018-06-21 DIAGNOSIS — I509 Heart failure, unspecified: Secondary | ICD-10-CM | POA: Insufficient documentation

## 2018-06-21 DIAGNOSIS — Z95810 Presence of automatic (implantable) cardiac defibrillator: Secondary | ICD-10-CM

## 2018-06-22 ENCOUNTER — Encounter (INDEPENDENT_AMBULATORY_CARE_PROVIDER_SITE_OTHER): Payer: Self-pay

## 2018-06-22 NOTE — Procedures (Signed)
Patient's ICD was checked by remote transmission and reviewed by Renaldo Harrison, PA-C.    Patient's ICD was evaluated, typically including programmed   parameters, lead(s), battery, capture, sensing function(s), presence or   absence of therapies, and underlying heart rhythm. Often but not always   this includes sensor rate response, lower and upper heart rates, AV   intervals, pacing voltage and pulse duration, sensing value, and   diagnostics.  Please see programmer printout for details (scanned document).    SJM #[id number]  CRT-D.  Battery status etr=1.4-2.6 years.  On Eliquis for TEP.  No recent vt/vf episodes.  No new alerts.  Normal device check.  Inocente Salles, RN  06/21/18    PACEMAKER OR DEFIBRILLATOR ANALYSIS OF REMOTE MONITORING    Order:    See patient's medical record. Patients followed remotely for pacemakers or defibrillators maintain standing orders for device followup.    Procedure Description:    Standard analysis of remote monitoring (typically done every 3 months).    Data recording:    See printouts, when available, for details.    Physician Analysis and Reporting:    Unremarkable device analysis. No major abnormalities, except as otherwise noted, if applicable.      Plan:    Plan continue routine device follow-up, as appropriate, or unless otherwise indicated.  Please see notes and/or scanned printouts (where available) for details.      Chantay Whitelock B. Noreene Filbert, M.D.  Pager: [phone]  Office: [phone]

## 2018-06-29 ENCOUNTER — Encounter (FREE_STANDING_LABORATORY_FACILITY)
Admit: 2018-06-29 | Discharge: 2018-06-29 | Disposition: A | Discharge status: Home / Self Care | Payer: Self-pay | Attending: GENERAL SURGERY | Admitting: GENERAL SURGERY

## 2018-06-29 ENCOUNTER — Other Ambulatory Visit (FREE_STANDING_LABORATORY_FACILITY): Payer: Self-pay | Admitting: GENERAL SURGERY

## 2018-06-29 DIAGNOSIS — R9431 Abnormal electrocardiogram [ECG] [EKG]: Secondary | ICD-10-CM

## 2018-07-01 LAB — HISTORICAL SURGICAL PATHOLOGY SPECIMEN

## 2018-07-27 ENCOUNTER — Encounter (INDEPENDENT_AMBULATORY_CARE_PROVIDER_SITE_OTHER): Payer: Self-pay

## 2018-07-27 ENCOUNTER — Ambulatory Visit (INDEPENDENT_AMBULATORY_CARE_PROVIDER_SITE_OTHER): Payer: Medicare Other | Admitting: Physician Assistant

## 2018-07-27 VITALS — BP 130/59 | HR 70 | Ht 72.0 in | Wt 210.6 lb

## 2018-07-27 DIAGNOSIS — I442 Atrioventricular block, complete: Secondary | ICD-10-CM

## 2018-07-27 DIAGNOSIS — Z95810 Presence of automatic (implantable) cardiac defibrillator: Secondary | ICD-10-CM

## 2018-07-27 NOTE — Progress Notes (Signed)
PATIENT NAME: [name]  DATE OF BIRTH:   HOSPITAL NUMBER: [id number]  DATE OF SERVICE: 07/27/2018    ELECTROPHYSIOLOGY CLINIC NOTE-[place]    Primary Care Provider: Nehemiah Settle, MD  No chief complaint on file.      SUBJECTIVE:   Pt is a 78 y.o. male presents to EP clinic today for follow up of BiV ICD. He had AVN ablation 04/2016.  He was previously followed in the device clinic by Dr. Lucinda Dell. The patient has a history of following permanent atrial fibrillation, s/p AVN ablation, ischemic cardiomyopathy (last EF 25% 11/2016 [facility] TTE), HTN, HLD, known CAD sp PCI to the LAD and RCA in 2013, COPD-oxygen dependent, CHFrEF with NYHA class 3, and vascular dementia. He is competent to make his own medical decisions. His sister accompanies him today.    He is listed DNR. He was discharged in 01/2018 from [facility] to Hosp[facility] care at [facility]. He voices no complaints. Edema is improving since he has started Hospice. Patient denies any chest pain, pressure, SOB, worsening dyspnea, syncope or presyncope since last office visit. He had cancer    Outpatient Medications Marked as Taking for the 07/27/18 encounter (Office Visit) with Milus Mallick, PA-C   Medication Sig Dispense Refill   . apixaban (ELIQUIS) 5 mg Oral Tablet Take 1 Tab (5 mg total) by mouth Twice daily     . aspirin (ECOTRIN LOW STRENGTH ORAL) Take 81 mg by mouth     . bumetanide (BUMEX) 0.5 mg Oral Tablet Take 0.5 mg by mouth Once a day     . calcitriol (ROCALTROL) 0.25 mcg Oral Capsule Take 0.25 mcg by mouth Once a day On Monday/Wednesday/Friday     . carvedilol (COREG) 3.125 mg Oral Tablet Take 1 Tab (3.125 mg total) by mouth Twice daily with food 180 Tab 3   . cholecalciferol, vitamin D3, 1,000 unit Oral Tablet Take 1,000 Units by mouth Once a day     . coenzyme Q10 (CO Q-10) 100 mg Oral Capsule Take 100 mg by mouth Every evening with dinner     . donepezil (ARICEPT) 10 mg Oral Tablet Take 10 mg by mouth Every night     . famotidine  (PEPCID) 20 mg Oral Tablet Take 20 mg by mouth Twice daily     . Ferrous Sulfate 142 mg (45 mg iron) Oral Tablet Sustained Release Take by mouth     . folic acid (FOLVITE) 1 mg Oral Tablet Take 1 mg by mouth Once a day     . hydrALAZINE (APRESOLINE) 10 mg Oral Tablet Take 1 Tab (10 mg total) by mouth Every 8 hours     . HYDROcodone-acetaminophen (NORCO) 5-325 mg Oral Tablet Take 1 Tab by mouth Every 6 hours as needed for Pain     . insulin glargine (LANTUS) 100 unit/mL Subcutaneous injection (vial) 15 Units by Subcutaneous route Every night      . insulin R human (HUMULIN R) 100 units/mL Subcutaneous Injectable 5 Units by Subcutaneous route Four times a day as needed      . isosorbide mononitrate (IMDUR) 30 mg Oral Tablet Sustained Release 24 hr Take 1 Tab (30 mg total) by mouth Once a day     . omega-3-DHA-EPA-fish oil (FISH OIL) 1,000 mg (120 mg-180 mg) Oral Capsule Take by mouth Once a day     . sennosides-docusate sodium (SENOKOT-S) 8.6-50 mg Oral Tablet Take 1 Tab by mouth Twice daily     . sodium bicarbonate 650  mg Oral Tablet Take 650 mg by mouth Twice daily     . torsemide (DEMADEX) 20 mg Oral Tablet Take 1 Tab (20 mg total) by mouth Once per day as needed (Only if gains 3 pounds in one day, or 5 pounds over 48 hours)         Review of Systems:  Constitutional: Negative for weight gain, fever, or chills   Integumentary: Negative for itching, rashes, or new skin lesions.  HEENT: Negative for hearing or vision loss.  Cardiovascular: Negative for chest pain or pressure, palpitations  Respiratory: Negative for cough, dyspnea on exertion, orthopnea.  Gastrointestinal: Denies melena or hematochezia  GU: Denies hematuria or dysuria  Neuro: Denies syncope or dizziness  Musculoskeletal: Negative for muscle pain or cramping, or general weakness.  Hematologic: Negative for anemia or bleeding tendencies.    OBJECTIVE:    Physical Exam:    BP (!) 130/59 (Site: Left)   Pulse 70   Ht 1.829 m (6')   Wt 95.5 kg (210 lb  9.6 oz)   SpO2 98% Comment: ra  BMI 28.56 kg/m   Constitutional - No acute distress, Well nourished, Well Developed.  Skin - Warm and dry.    Head - Normocephalic, Atraumatic no lesions noted  Eyes - Nonicteric  Heart - RRR without M/R/G.  PMI is nondisplaced.              No JVD/Bruit              Pulses equal upper and lower extremity (Radial, DP and PT)             No edema of LE.             Lungs - Normal respiratory effort.                 CTA Bilaterally  Abdomen - Soft non-tender/nondistended with good BS in all 4 quadrants  Musculoskeletal - strength equal in all 4 extremities.  Neuro - A&O to person, place and time.  Answers all questions appropriately.               EOMI.    DATA:  Device interrogation:  Device was interrogated and changes were made to manually evaluate sensing, threshold, and impedance today. Interrogation to be scanned to patient's chart. reviewed the device diagnostics. He had events on 06/29/18 of noise that inhibited pacing and classified as VF. Device charged but did not deliver therapy. Sister confirmed he had cancerous lesions removed from his back that day.       ASSESSMENT AND PLAN:    1.   Appropriately  functioning SJM CRT-D implanted  for ICM, CHB s/p AVN ablation. We had a lengthy discussion regarding end-of-life care and DNR status. Patient voiced he would like ICD tachy therapies today.  2.   Chronic atrial fibrillation. s/p AVN ablation.  3.   On Eliquis for thromboembolic prophylaxis. CHADS2VASC= 6. (CHF, age >61, PAD (ischemic dementia), HTN, CHF).  4.   ICM, EF 35%(01/2018).  6.   h/o orthostatic hypotension.  7.   CAD., follows with Dr. Dellia Beckwith.    Patient was seen and evaluated by the collaborating physician, Dr. Roland Earl, who  formulated the plan today.    We had a lengthy discussion using the heart diagram explain both Afib and VT today. With the patient choosing to be DNR, he explained his VT and turning off VT tachy therapy. Patient agreed and all tachy  therapies were turned  off (alerts automatically turn off). No changes to the patient's medical therapy. Return to EP Clinic for 6 month follow-up to avoid winter travel and then plan to push apt out to annual.      Milus Mallick, PA-C  Device information per scanned sheet. Programmed appropriate to problem set.   I personally saw and evaluated the patient. See mid-level's note for additional details. My findings/participation are nearing end stage disease and DNR.    Lavada Mesi, MD, [facility], [facility]  Assistant Professor, Section of Cardiology, [facility] Medicine

## 2018-07-27 NOTE — Nursing Note (Signed)
Meds checked with patients list and confirmed correct pharmacy with patient for e-scribing.   Lorane Gell, RN  07/27/2018, 13:46

## 2018-07-29 ENCOUNTER — Telehealth (HOSPITAL_COMMUNITY): Payer: Self-pay | Admitting: Physician Assistant

## 2018-07-29 NOTE — Telephone Encounter (Addendum)
Called left message to call our office.//aac    ----- Message from Milus Mallick, [place] sent at 07/29/2018 11:34 AM EST -----  Thank you!     Drinda Butts- Can you let them know if he needs more work done, he needs a magnet used on his device prior to the procedure so cautery does not inhibit pacing (pacing dependent). Physician can reach out to [place] if any questions.  ----- Message -----  From: Ortencia Kick  Sent: 07/27/2018   4:27 PM EST  To: Milus Mallick, PA-C    Tamala Bari, Pt's sister called and said she wanted to let you know that  [place] procedure was on Dec.17,2019,It was to remove cancer lesions from his back. Thanks!

## 2018-07-29 NOTE — Telephone Encounter (Signed)
Called left message to call our office.//aac    ----- Message from Milus Mallick, [place] sent at 07/29/2018 11:34 AM EST -----  Thank you!     Drinda Butts- Can you let them know if he needs more work done, he needs a magnet used on his device prior to the procedure so cautery does not inhibit pacing (pacing dependent). Physician can reach out to [place] if any questions.  ----- Message -----  From: Ortencia Kick  Sent: 07/27/2018   4:27 PM EST  To: Milus Mallick, PA-C    Tamala Bari, Pt's sister called and said she wanted to let you know that  [place] procedure was on Dec.17,2019,It was to remove cancer lesions from his back. Thanks!       Called spoke with Sister . She was notified and will relay information on to Dermatology office and Nursing facility where patient resides.

## 2018-10-05 ENCOUNTER — Encounter (INDEPENDENT_AMBULATORY_CARE_PROVIDER_SITE_OTHER): Payer: Self-pay | Admitting: Nurse Practitioner

## 2018-10-05 LAB — ENTER/EDIT NEPHROLOGY EXTERNAL LABS
25-HYDROXY VITAMIN D: 37.7
BUN/CREAT RATIO: 14.09
BUN: 31
CALCIUM: 9
CARBON DIOXIDE: 25
CHLORIDE: 95
CREATININE: 2.2
ESTIMATED GLOMERULAR FILTRATION RATE: 29
GLUCOSE, FASTING: 246
HCT: 43.8
HEMOGLOBIN A1C: 7.8
HGB: 14.8
INTACT PTH: 148.3
IRON: 70
PHOSPHORUS: 3.2
PLATELET COUNT: 178
POTASSIUM: 3.5
SODIUM: 133
WBC: 8.9

## 2018-10-06 ENCOUNTER — Encounter (INDEPENDENT_AMBULATORY_CARE_PROVIDER_SITE_OTHER): Payer: Self-pay | Admitting: Nurse Practitioner

## 2018-10-26 ENCOUNTER — Ambulatory Visit: Payer: Medicare Other | Attending: Internal Medicine

## 2018-10-26 DIAGNOSIS — Z95810 Presence of automatic (implantable) cardiac defibrillator: Principal | ICD-10-CM

## 2018-10-26 DIAGNOSIS — I447 Left bundle-branch block, unspecified: Secondary | ICD-10-CM

## 2018-10-26 DIAGNOSIS — I4891 Unspecified atrial fibrillation: Secondary | ICD-10-CM

## 2018-10-26 DIAGNOSIS — I509 Heart failure, unspecified: Secondary | ICD-10-CM

## 2018-10-26 NOTE — Procedures (Signed)
Patient's ICD was checked by remote transmission and reviewed by Renaldo Harrison, PA-C.    Patient's ICD was evaluated, typically including programmed   parameters, lead(s), battery, capture, sensing function(s), presence or   absence of therapies, and underlying heart rhythm. Often but not always   this includes sensor rate response, lower and upper heart rates, AV   intervals, pacing voltage and pulse duration, sensing value, and   diagnostics.  Please see programmer printout for details (scanned document).    [name]       [id number]     10/26/2018   STJ CRT-D   Patient's CIED was checked by remote transmission.     Battery 1.4-2 years.   Events - AMS 100% - permanent Afib (known)   BP 90%     Tachy Detections OFF = Pt DNR & turned off 07/2018 OV.     CIED demonstrated appropriate device function with above observations.   Please see printout for details scanned under media tab.     Continue to monitor remotely.       Ranee Gosselin, PA-C 10/26/2018, 07:34     Mariana Arn, RN 10/26/2018  Device information per scanned sheet. Programmed appropriate to problem set.     Lavada Mesi, MD, [facility], [facility]  Assistant Professor, Section of Cardiology  [facility] Department of Medicine

## 2018-12-03 MED ADMIN — sodium chloride 0.9 % (flush) injection syringe: @ 14:00:00

## 2018-12-27 ENCOUNTER — Encounter (INDEPENDENT_AMBULATORY_CARE_PROVIDER_SITE_OTHER): Payer: Self-pay | Admitting: Internal Medicine

## 2018-12-29 ENCOUNTER — Encounter (INDEPENDENT_AMBULATORY_CARE_PROVIDER_SITE_OTHER): Payer: Self-pay | Admitting: Nurse Practitioner

## 2019-01-26 ENCOUNTER — Ambulatory Visit: Payer: Medicare Other | Attending: Cardiovascular Disease

## 2019-01-26 ENCOUNTER — Encounter (HOSPITAL_COMMUNITY): Payer: Self-pay | Admitting: Internal Medicine

## 2019-01-26 DIAGNOSIS — Z4502 Encounter for adjustment and management of automatic implantable cardiac defibrillator: Secondary | ICD-10-CM | POA: Insufficient documentation

## 2019-01-26 DIAGNOSIS — I4891 Unspecified atrial fibrillation: Secondary | ICD-10-CM | POA: Insufficient documentation

## 2019-01-26 DIAGNOSIS — Z95810 Presence of automatic (implantable) cardiac defibrillator: Secondary | ICD-10-CM

## 2019-01-26 DIAGNOSIS — I509 Heart failure, unspecified: Secondary | ICD-10-CM | POA: Insufficient documentation

## 2019-01-26 DIAGNOSIS — I447 Left bundle-branch block, unspecified: Secondary | ICD-10-CM | POA: Insufficient documentation

## 2019-01-27 NOTE — Procedures (Signed)
Patient's ICD was checked by remote transmission and reviewed by Cornelia Copa, PA-C.    Patient's ICD was evaluated, typically including programmed   parameters, lead(s), battery, capture, sensing function(s), presence or   absence of therapies, and underlying heart rhythm. Often but not always   this includes sensor rate response, lower and upper heart rates, AV   intervals, pacing voltage and pulse duration, sensing value, and   diagnostics.  Please see programmer printout for details (scanned document).    SJM #[id number]  CRT-D.  Battery status etr=1.3-2.1 years.  On Eliquis for TEP.  No recent vt/vf episodes.  No new alerts.  Normal device check.  Mat Carne, RN  01/26/19  Device information per scanned sheet. Programmed appropriate to problem set.     Ivar Drape, MD, [facility], [facility]  Assistant Professor, Section of Cardiology  [facility] Department of Medicine

## 2019-02-08 ENCOUNTER — Ambulatory Visit (INDEPENDENT_AMBULATORY_CARE_PROVIDER_SITE_OTHER): Payer: Medicare Other | Admitting: Cardiovascular Disease

## 2019-02-08 ENCOUNTER — Other Ambulatory Visit: Payer: Self-pay

## 2019-02-08 ENCOUNTER — Encounter (INDEPENDENT_AMBULATORY_CARE_PROVIDER_SITE_OTHER): Payer: Self-pay | Admitting: Cardiovascular Disease

## 2019-02-08 VITALS — BP 112/70 | HR 70

## 2019-02-08 DIAGNOSIS — E1129 Type 2 diabetes mellitus with other diabetic kidney complication: Secondary | ICD-10-CM

## 2019-02-08 DIAGNOSIS — I480 Paroxysmal atrial fibrillation: Secondary | ICD-10-CM

## 2019-02-08 DIAGNOSIS — N183 Chronic kidney disease, stage 3 unspecified (CMS HCC): Secondary | ICD-10-CM

## 2019-02-08 DIAGNOSIS — I442 Atrioventricular block, complete: Secondary | ICD-10-CM | POA: Insufficient documentation

## 2019-02-08 DIAGNOSIS — I2510 Atherosclerotic heart disease of native coronary artery without angina pectoris: Secondary | ICD-10-CM

## 2019-02-08 DIAGNOSIS — Z95810 Presence of automatic (implantable) cardiac defibrillator: Secondary | ICD-10-CM

## 2019-02-08 NOTE — Progress Notes (Signed)
[facility]  [address]  [place] [address]  Progress Note       Patient Name:  [name]       MRN:  [id number]   DOB:    Date of Service:  02/08/2019       Chief Complaint: Defibrillator Check ([employer] (ICM)); Atrial Fibrillation (paroxysmal atrial fibrillation); CHF (NYHA class II); and Hypertension      Subjective: [name] Pt is a 78 y.o. male presents to EP clinic today for follow up of BiV ICD. He had AVN ablation 04/2016.  He was previously followed in the device clinic by Dr. Beverley Fiedler. The patient has a history of following permanent atrial fibrillation, s/p AVN ablation, ischemic cardiomyopathy (last EF 25% 11/2016 [place] TTE), HTN, HLD, known CAD sp PCI to the LAD and RCA in 2013, COPD-oxygen dependent, CHFrEF with NYHA class 3, and vascular dementia. He is competent to make his own medical decisions. He had cancer  His sister accompanies him today.     He is listed DNR. He was discharged in 01/2018 from [facility] to Hospice care at [facility]. He voices no complaints. Edema is improving since he has started Hospice.     Patient denies any chest pain, pressure, SOB, worsening dyspnea, syncope or presyncope since last office visit.        Review of Systems:  Other than ROS in the physical exam, all other systems were negative.    Current Problems:  Patient Active Problem List    Diagnosis   . Complete heart block (CMS HCC)   . CHF (congestive heart failure) (CMS HCC)   . Acute on chronic systolic heart failure, NYHA class 4 (CMS HCC)   . Hypokalemia   . 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 Bilateral lower extremity edema   . Diarrhea   . Persistent atrial fibrillation   . 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 Systolic congestive heart failure, NYHA class 2 (CMS HCC)   . CKD (chronic kidney disease) stage III   . Anemia   . Colon polyps   . Vitamin B deficiency   . Piriformis syndrome   . Hyperlipidemia   . Atrial fibrillation with RVR  (CMS HCC)   . ICD (implantable cardioverter-defibrillator) in place   . Hypotension   . Ischemic cardiomyopathy   . CHF (NYHA class II, ACC/AHA stage C) (CMS HCC)   . Atrial fibrillation (CMS HCC)   . Congestive heart failure with LV diastolic dysfunction, NYHA class 1 (CMS HCC)   . PAF (paroxysmal atrial fibrillation) (CMS HCC)   . CAD (coronary artery disease)   . S/P angioplasty with stent   . Altered mental status   . Elevated troponin   . Essential hypertension   . 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 LBBB (left bundle branch block)       Current Outpatient Medications   Medication Sig   . apixaban (ELIQUIS) 5 mg Oral Tablet Take 1 Tab (5 mg total) by mouth Twice daily   . aspirin (ECOTRIN LOW STRENGTH ORAL) Take 81 mg by mouth   . bumetanide (BUMEX) 0.5 mg Oral Tablet Take 0.5 mg by mouth Once a day   . bumetanide (BUMEX) 1 mg Oral Tablet Take 1 mg by mouth Once a day   . calcitriol (ROCALTROL) 0.25 mcg Oral Capsule Take 0.25 mcg by mouth Once a day  On Monday/Wednesday/Friday   . carvedilol (COREG) 3.125 mg Oral Tablet Take 1 Tab (3.125 mg total) by mouth Twice daily with food   . cholecalciferol, vitamin D3, 1,000 unit Oral Tablet Take 1,000 Units by mouth Once a day   . coenzyme Q10 (CO Q-10) 100 mg Oral Capsule Take 100 mg by mouth Every evening with dinner   . donepezil (ARICEPT) 10 mg Oral Tablet Take 10 mg by mouth Every night   . famotidine (PEPCID) 20 mg Oral Tablet Take 20 mg by mouth Twice daily   . Ferrous Sulfate 142 mg (45 mg iron) Oral Tablet Sustained Release Take by mouth   . folic acid (FOLVITE) 1 mg Oral Tablet Take 1 mg by mouth Once a day   . hydrALAZINE (APRESOLINE) 10 mg Oral Tablet Take 1 Tab (10 mg total) by mouth Every 8 hours   . HYDROcodone-acetaminophen (NORCO) 5-325 mg Oral Tablet Take 1 Tab by mouth Every 6 hours as needed for Pain   . insulin glargine (LANTUS) 100 unit/mL Subcutaneous injection (vial) 15 Units by Subcutaneous route Every night    .  insulin R human (HUMULIN R) 100 units/mL Subcutaneous Injectable 5 Units by Subcutaneous route Four times a day as needed    . isosorbide mononitrate (IMDUR) 30 mg Oral Tablet Sustained Release 24 hr Take 1 Tab (30 mg total) by mouth Once a day   . Morphine 20 mg/5 mL (4 mg/mL) Oral Solution Take 10 mg by mouth Every 2 hours as needed Pt can have 5, 10 or 15mg  PO hourly as needed for pain   . nitroGLYCERIN (NITROSTAT) 0.4 mg Sublingual Tablet, Sublingual 0.4 mg by Sublingual route Every 5 minutes as needed for Chest pain for 3 doses over 15 minutes   . omega-3-DHA-EPA-fish oil (FISH OIL) 1,000 mg (120 mg-180 mg) Oral Capsule Take by mouth Once a day   . sennosides-docusate sodium (SENOKOT-S) 8.6-50 mg Oral Tablet Take 1 Tab by mouth Twice daily   . sodium bicarbonate 650 mg Oral Tablet Take 650 mg by mouth Twice daily   . torsemide (DEMADEX) 20 mg Oral Tablet Take 1 Tab (20 mg total) by mouth Once per day as needed (Only if gains 3 pounds in one day, or 5 pounds over 48 hours) (Patient not taking: Reported on 02/08/2019)     Allergies   Allergen Reactions   . Lipitor [Atorvastatin]      Concerned about cognitive function.   . Pravachol [Pravastatin] Rash   . Amiodarone        Family History  Family Medical History:     Problem Relation (Age of Onset)    Cancer Sister    Coronary Artery Disease Other    Diabetes Mother, Sister    High Cholesterol Father    Hypertension (High Blood Pressure) Father              Social History:  Social History     Socioeconomic History   . Marital status: Divorced     Spouse name: Not on file   . Number of children: Not on file   . Years of education: Not on file   . Highest education level: Not on file   Tobacco Use   . Smoking status: Never Smoker   . Smokeless tobacco: Never Used   Substance and Sexual Activity   . Alcohol use: No   . Drug use: No   . Sexual activity: Yes     Birth control/protection: Condom  Other Topics Concern   . Right hand dominant Yes   . Ability to Walk 2  Flight of Steps without SOB/CP Yes       Physical Examination:  BP 112/70   Pulse 70   SpO2 97%           Constitutional: Vital signs reviewed as above.  General appearance: Nondistressed. Stated age. Well nourished.  CV: No JVD. No carotid bruit. Heart revealed  Irregular rate and rhythm.  No murmur, rub, or gallop. Lower Extremities show 2+  edema . 2+ and symmetric pulses.  Resp: Lungs are clear.  GI: Abdomen is soft.  Neuro: Exam grossly intact.  Eyes: EOMI.   ENT: Without issue  Skin: Warm and dry.   Psych: Oriented x 3. Appropriate demeanor.         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   Appropriately  functioning SJM CRT-D implanted  for ICM, CHB s/p AVN ablation.   2.   Chronic atrial fibrillation. s/p AVN ablation.  3.   On Eliquis for thromboembolic prophylaxis. CHADS2VASC= 6. (CHF, age >10, PAD (ischemic dementia), HTN, CHF).  4.   ICM, EF 35%(01/2018).  6.   h/o orthostatic hypotension.  7.   CAD. follows with Dr. Dellia Beckwith.      Plan:  1. No change in medical therapy.  2. Follow up in 6 months or sooner if problems should arise.      I am scribing for, and in the presence of Dr. Lavada Mesi, MD for services provided on 02/08/2019.  Greggory Brandy, RN  Device information per scanned sheet. Programmed appropriate to problem set.   I personally performed the services described in this documentation, as scribed  in my presence, and it is both accurate  and complete.    Lavada Mesi, MD, [facility], [facility]  Assistant Professor, Section of Cardiology,[facility]

## 2019-02-17 ENCOUNTER — Encounter (INDEPENDENT_AMBULATORY_CARE_PROVIDER_SITE_OTHER): Payer: Self-pay | Admitting: Internal Medicine

## 2019-02-23 ENCOUNTER — Encounter (INDEPENDENT_AMBULATORY_CARE_PROVIDER_SITE_OTHER): Payer: Self-pay | Admitting: Nurse Practitioner

## 2019-03-09 ENCOUNTER — Encounter (INDEPENDENT_AMBULATORY_CARE_PROVIDER_SITE_OTHER): Payer: Self-pay | Admitting: Nurse Practitioner

## 2019-04-12 ENCOUNTER — Ambulatory Visit (HOSPITAL_COMMUNITY): Payer: Self-pay | Admitting: Family Medicine

## 2019-04-21 ENCOUNTER — Ambulatory Visit (HOSPITAL_COMMUNITY): Payer: Self-pay | Admitting: Family Medicine

## 2019-04-26 ENCOUNTER — Encounter (INDEPENDENT_AMBULATORY_CARE_PROVIDER_SITE_OTHER): Payer: Self-pay | Admitting: Nurse Practitioner

## 2019-05-09 ENCOUNTER — Ambulatory Visit: Payer: Medicare Other | Attending: Cardiovascular Disease

## 2019-05-09 DIAGNOSIS — Z4509 Encounter for adjustment and management of other cardiac device: Secondary | ICD-10-CM | POA: Insufficient documentation

## 2019-05-09 DIAGNOSIS — Z95810 Presence of automatic (implantable) cardiac defibrillator: Secondary | ICD-10-CM

## 2019-05-09 DIAGNOSIS — I4891 Unspecified atrial fibrillation: Secondary | ICD-10-CM | POA: Insufficient documentation

## 2019-05-09 DIAGNOSIS — I509 Heart failure, unspecified: Secondary | ICD-10-CM | POA: Insufficient documentation

## 2019-05-09 DIAGNOSIS — I447 Left bundle-branch block, unspecified: Secondary | ICD-10-CM | POA: Insufficient documentation

## 2019-05-09 NOTE — Procedures (Signed)
Patient's CRT-D was checked by remote transmission.  Reviewed by [employer].    Patient's CRT-D was evaluated, typically including programmed   parameters, lead(s), battery, capture, sensing function(s), presence or   absence of therapies, and underlying heart rhythm. Often but not always   this includes sensor rate response, lower and upper heart rates, AV   intervals, pacing voltage and pulse duration, sensing value, and   diagnostics.  Please see programmer printout for details (scanned document).    Battery longevity remaining approximately 1.3-2 years.      No significant alerts.Mode switch 100%. A.Fib.   Normal device function.    Dezaray Laverle Hobby, RN  Device information per scanned sheet. Programmed appropriate to problem set.     Ivar Drape, MD, [facility], [facility]  Assistant Professor, Section of Cardiology  [facility] Department of Medicine

## 2019-05-23 ENCOUNTER — Other Ambulatory Visit (HOSPITAL_COMMUNITY): Payer: Self-pay | Admitting: Cardiovascular Disease

## 2019-08-08 DIAGNOSIS — Z95810 Presence of automatic (implantable) cardiac defibrillator: Secondary | ICD-10-CM

## 2019-08-09 ENCOUNTER — Encounter (INDEPENDENT_AMBULATORY_CARE_PROVIDER_SITE_OTHER): Payer: Self-pay

## 2019-09-12 DEATH — deceased

## 2019-09-20 ENCOUNTER — Encounter (INDEPENDENT_AMBULATORY_CARE_PROVIDER_SITE_OTHER): Payer: Medicare Other | Admitting: Cardiovascular Disease
